# Patient Record
Sex: Male | Born: 1940 | Race: White | Hispanic: No | Marital: Married | Smoking: Never smoker
Health system: Southern US, Community
[De-identification: ages and names within clinical notes are randomized; demographics above are authoritative.]

## PROBLEM LIST (undated history)

## (undated) DIAGNOSIS — G4730 Sleep apnea, unspecified: Secondary | ICD-10-CM

## (undated) DIAGNOSIS — R0981 Nasal congestion: Secondary | ICD-10-CM

## (undated) DIAGNOSIS — Z789 Other specified health status: Secondary | ICD-10-CM

## (undated) DIAGNOSIS — J302 Other seasonal allergic rhinitis: Secondary | ICD-10-CM

## (undated) DIAGNOSIS — N289 Disorder of kidney and ureter, unspecified: Secondary | ICD-10-CM

## (undated) DIAGNOSIS — M109 Gout, unspecified: Secondary | ICD-10-CM

## (undated) DIAGNOSIS — E785 Hyperlipidemia, unspecified: Secondary | ICD-10-CM

## (undated) DIAGNOSIS — I252 Old myocardial infarction: Secondary | ICD-10-CM

## (undated) DIAGNOSIS — K589 Irritable bowel syndrome without diarrhea: Secondary | ICD-10-CM

## (undated) DIAGNOSIS — M1990 Unspecified osteoarthritis, unspecified site: Secondary | ICD-10-CM

## (undated) DIAGNOSIS — I2510 Atherosclerotic heart disease of native coronary artery without angina pectoris: Secondary | ICD-10-CM

## (undated) HISTORY — PX: COLONOSCOPY: SHX174

## (undated) HISTORY — DX: Old myocardial infarction: I25.2

## (undated) HISTORY — DX: Other specified health status: Z78.9

## (undated) HISTORY — DX: Atherosclerotic heart disease of native coronary artery without angina pectoris: I25.10

## (undated) HISTORY — PX: TOE DEBRIDEMENT: SHX1069

---

## 2002-11-27 ENCOUNTER — Emergency Department (HOSPITAL_COMMUNITY): Admission: EM | Admit: 2002-11-27 | Discharge: 2002-11-27 | Payer: Self-pay | Admitting: *Deleted

## 2002-11-27 ENCOUNTER — Encounter: Payer: Self-pay | Admitting: *Deleted

## 2003-01-13 ENCOUNTER — Encounter: Payer: Self-pay | Admitting: Family Medicine

## 2003-01-13 ENCOUNTER — Encounter: Admission: RE | Admit: 2003-01-13 | Discharge: 2003-01-13 | Payer: Self-pay | Admitting: Family Medicine

## 2003-02-22 ENCOUNTER — Ambulatory Visit (HOSPITAL_COMMUNITY): Admission: RE | Admit: 2003-02-22 | Discharge: 2003-02-22 | Payer: Self-pay | Admitting: Gastroenterology

## 2003-08-17 ENCOUNTER — Encounter: Admission: RE | Admit: 2003-08-17 | Discharge: 2003-08-17 | Payer: Self-pay | Admitting: Gastroenterology

## 2004-05-29 ENCOUNTER — Encounter: Admission: RE | Admit: 2004-05-29 | Discharge: 2004-05-29 | Payer: Self-pay | Admitting: Family Medicine

## 2008-09-16 ENCOUNTER — Encounter
Admission: RE | Admit: 2008-09-16 | Discharge: 2008-09-16 | Payer: Self-pay | Admitting: Physical Medicine and Rehabilitation

## 2008-12-13 ENCOUNTER — Encounter: Admission: RE | Admit: 2008-12-13 | Discharge: 2008-12-13 | Payer: Self-pay | Admitting: Family Medicine

## 2008-12-24 ENCOUNTER — Encounter: Admission: RE | Admit: 2008-12-24 | Discharge: 2008-12-24 | Payer: Self-pay | Admitting: Orthopedic Surgery

## 2009-10-08 HISTORY — PX: INCISION AND DRAINAGE: SHX5863

## 2010-03-24 ENCOUNTER — Emergency Department (HOSPITAL_COMMUNITY): Admission: EM | Admit: 2010-03-24 | Discharge: 2010-03-24 | Payer: Self-pay | Admitting: Emergency Medicine

## 2010-03-25 ENCOUNTER — Ambulatory Visit (HOSPITAL_COMMUNITY): Admission: RE | Admit: 2010-03-25 | Discharge: 2010-03-25 | Payer: Self-pay | Admitting: Orthopedic Surgery

## 2010-10-29 ENCOUNTER — Encounter: Payer: Self-pay | Admitting: Family Medicine

## 2010-12-24 LAB — CBC
HCT: 40.2 % (ref 39.0–52.0)
Hemoglobin: 14.1 g/dL (ref 13.0–17.0)
MCHC: 35.1 g/dL (ref 30.0–36.0)
MCV: 95 fL (ref 78.0–100.0)
Platelets: 210 10*3/uL (ref 150–400)
RBC: 4.23 MIL/uL (ref 4.22–5.81)
RDW: 12.7 % (ref 11.5–15.5)
WBC: 4.7 10*3/uL (ref 4.0–10.5)

## 2011-02-23 NOTE — Op Note (Signed)
   NAME:  Timothy Wilcox, Timothy Wilcox                      ACCOUNT NO.:  [id number]   MEDICAL RECORD NO.:  [id number]                   PATIENT TYPE:  AMB   LOCATION:  ENDO                                 FACILITY:  [facility]   PHYSICIAN:  James L. Malon Kindle., M.D.          DATE OF BIRTH:  August 20, 1941   DATE OF PROCEDURE:  02/22/2003  DATE OF DISCHARGE:                                 OPERATIVE REPORT   PROCEDURE:  Colonoscopy.   MEDICATIONS:  Fentanyl 100 mcg, Versed 10 mg IV.   INDICATIONS:  The patient has previous colonoscopy.  Has a very strong  family history of colon cancer in his father.  This is done as a five-year  followup.   DESCRIPTION OF PROCEDURE:  The procedure had been explained to the patient  and consent obtained.  The patient was placed in the left lateral decubitus  position.  The Olympus pediatric intestinal colonoscope was inserted and  advanced to the cecum using abdominal pressure and position changes.  The  cecum, ascending colon, transverse colon, descending and sigmoid colon were  seen well.  No polyps or other lesions were seen.  The rectum was free of  polyps.  On the retroflexed view, the patient was seen to have a large  internal hemorrhoid.  The scope was withdrawn.  The patient tolerated the  procedure well, maintained on low-flow oxygen, pulse oximetry throughout the  procedure.   ASSESSMENT:  1. No evidence of polyps in this high-risk individual.  2. Internal hemorrhoids, given hemorrhoid instruction sheet.  3. Will end up recommending repeat procedure in five years.                                                 James L. Malon Kindle., M.D.    Waldron Session  D:  02/22/2003  T:  02/22/2003  Job:  [id number]

## 2012-03-24 ENCOUNTER — Emergency Department (HOSPITAL_COMMUNITY)
Admission: EM | Admit: 2012-03-24 | Discharge: 2012-03-24 | Disposition: A | Discharge status: Home / Self Care | Payer: Medicare Other | Attending: Emergency Medicine | Admitting: Emergency Medicine

## 2012-03-24 ENCOUNTER — Emergency Department (HOSPITAL_COMMUNITY): Payer: Medicare Other

## 2012-03-24 ENCOUNTER — Encounter (HOSPITAL_COMMUNITY): Payer: Self-pay | Admitting: Emergency Medicine

## 2012-03-24 DIAGNOSIS — N201 Calculus of ureter: Secondary | ICD-10-CM | POA: Insufficient documentation

## 2012-03-24 DIAGNOSIS — N200 Calculus of kidney: Secondary | ICD-10-CM

## 2012-03-24 DIAGNOSIS — R109 Unspecified abdominal pain: Secondary | ICD-10-CM | POA: Insufficient documentation

## 2012-03-24 DIAGNOSIS — N23 Unspecified renal colic: Secondary | ICD-10-CM

## 2012-03-24 HISTORY — DX: Disorder of kidney and ureter, unspecified: N28.9

## 2012-03-24 LAB — POCT I-STAT, CHEM 8
BUN: 24 mg/dL — ABNORMAL HIGH (ref 6–23)
Calcium, Ion: 1.14 mmol/L (ref 1.12–1.32)
Chloride: 108 mEq/L (ref 96–112)
Creatinine, Ser: 0.9 mg/dL (ref 0.50–1.35)
Glucose, Bld: 115 mg/dL — ABNORMAL HIGH (ref 70–99)
HCT: 45 % (ref 39.0–52.0)
Hemoglobin: 15.3 g/dL (ref 13.0–17.0)
Potassium: 3.7 mEq/L (ref 3.5–5.1)
Sodium: 142 mEq/L (ref 135–145)
TCO2: 22 mmol/L (ref 0–100)

## 2012-03-24 MED ORDER — ONDANSETRON HCL 4 MG/2ML IJ SOLN
4.0000 mg | Freq: Once | INTRAMUSCULAR | Status: DC
  Filled 2012-03-24: qty 2

## 2012-03-24 MED ORDER — HYDROMORPHONE HCL PF 1 MG/ML IJ SOLN
1.0000 mg | Freq: Once | INTRAMUSCULAR | Status: AC
  Administered 2012-03-24: 1 mg via INTRAVENOUS
  Filled 2012-03-24: qty 1

## 2012-03-24 MED ORDER — ONDANSETRON 8 MG PO TBDP: 8.0000 mg | ORAL_TABLET | Freq: Three times a day (TID) | ORAL | Status: AC | PRN

## 2012-03-24 MED ORDER — HYDROMORPHONE HCL PF 2 MG/ML IJ SOLN
2.0000 mg | Freq: Once | INTRAMUSCULAR | Status: AC
  Administered 2012-03-24: 2 mg via INTRAVENOUS
  Filled 2012-03-24: qty 1

## 2012-03-24 MED ORDER — KETOROLAC TROMETHAMINE 30 MG/ML IJ SOLN
30.0000 mg | Freq: Once | INTRAMUSCULAR | Status: AC
  Administered 2012-03-24: 30 mg via INTRAVENOUS
  Filled 2012-03-24: qty 1

## 2012-03-24 MED ORDER — OXYCODONE-ACETAMINOPHEN 5-325 MG PO TABS: 1.0000 | ORAL_TABLET | Freq: Four times a day (QID) | ORAL | Status: AC | PRN

## 2012-03-24 MED ORDER — TAMSULOSIN HCL 0.4 MG PO CAPS: 0.4000 mg | ORAL_CAPSULE | Freq: Every day | ORAL | Status: DC

## 2012-03-24 MED ORDER — SODIUM CHLORIDE 0.9 % IV BOLUS (SEPSIS)
1000.0000 mL | Freq: Once | INTRAVENOUS | Status: AC
  Administered 2012-03-24: 1000 mL via INTRAVENOUS

## 2012-03-24 NOTE — ED Notes (Signed)
Pt c/o right flank pain. Reports h/o kidney stones.

## 2012-03-24 NOTE — ED Notes (Signed)
Patient aware of need for urine specimen. Patient unable to void at this time. Patient given urinal. Encouraged to call for assistance if needed.   

## 2012-03-24 NOTE — ED Provider Notes (Signed)
History     CSN: [id number]  Arrival date & time 03/24/12  1358   First MD Initiated Contact with Patient 03/24/12 1420      Chief Complaint  Patient presents with  . Flank Pain    (Consider location/radiation/quality/duration/timing/severity/associated sxs/prior treatment) HPI Comments: Patient with a history of kidney stones comes in today with right sided flank pain.  He reports that the pain has been intermittent and began today.  Pain gradually worsening and becoming more constant.  Pain radiated to the RLQ of the abdomen.  He reports that the pain today is similar to pain that he has had in the past when he has had a kidney stone.  Last kidney stone was approximately 1 year ago.  Pain associated with nausea, but no vomiting.  He has not noticed any gross hematuria.  No fever or chills.  Patient is a 71 y.o. male presenting with flank pain. The history is provided by the patient.  Flank Pain Associated symptoms include nausea. Pertinent negatives include no abdominal pain, chills, fever or vomiting.    Past Medical History  Diagnosis Date  . Renal disorder     kidney stones    History reviewed. No pertinent past surgical history.  History reviewed. No pertinent family history.  History  Substance Use Topics  . Smoking status: Not on file  . Smokeless tobacco: Not on file  . Alcohol Use:       Review of Systems  Constitutional: Negative for fever and chills.  Respiratory: Negative for shortness of breath.   Gastrointestinal: Positive for nausea. Negative for vomiting and abdominal pain.  Genitourinary: Positive for flank pain. Negative for dysuria, urgency, hematuria, decreased urine volume, scrotal swelling, difficulty urinating and testicular pain.  Neurological: Negative for dizziness, syncope and light-headedness.  Psychiatric/Behavioral: Negative for confusion.    Allergies  Statins  Home Medications  No current outpatient prescriptions on file.  BP  141/64  Pulse 69  Temp 97.8 F (36.6 C) (Oral)  Resp 26  SpO2 100%  Physical Exam  Nursing note and vitals reviewed. Constitutional: He appears well-developed and well-nourished. He appears distressed.       Uncomfortable appearing  HENT:  Head: Normocephalic and atraumatic.  Mouth/Throat: Oropharynx is clear and moist.  Cardiovascular: Normal rate, regular rhythm and normal heart sounds.   Pulmonary/Chest: Effort normal and breath sounds normal.  Abdominal: Soft. Bowel sounds are normal. He exhibits no distension and no mass. There is no tenderness. There is CVA tenderness. There is no rigidity, no rebound and no guarding.       Right CVA tenderness  Neurological: He is alert.  Skin: Skin is warm and dry. He is not diaphoretic.  Psychiatric: He has a normal mood and affect.    ED Course  Procedures (including critical care time)  Labs Reviewed - No data to display Ct Abdomen Pelvis Wo Contrast  03/24/2012  *RADIOLOGY REPORT*  Clinical Data: Right flank pain  CT ABDOMEN AND PELVIS WITHOUT CONTRAST  Technique:  Multidetector CT imaging of the abdomen and pelvis was performed following the standard protocol without intravenous contrast.  Comparison: Report 11/27/2002 no images available  Findings: Lung bases are unremarkable.  Punctate calcifications within liver and spleen are probable due to prior granulomatous disease.  Small hiatal hernia is noted.  Sagittal images of the spine shows disc space flattening with mild anterior and mild posterior spurring at L1-L2 and L2-L3 level. Disc calcifications are noted at L4-L5 level.  Mild posterior spurring  at L5 S1 level.  No calcified gallstones are noted within gallbladder.  Mild atherosclerotic calcifications of distal abdominal aorta and iliac arteries.  There is ectatic distal abdominal aorta measures 2.5 cm x 2.5 cm in diameter.  There is mild right hydronephrosis and proximal right hydroureter. Mild right perinephric stranding is noted.   Tiny nonobstructive calculus in the lower pole of the right kidney measures 2 mm.  There is a nonobstructive calcified calculus in the upper pole of the left kidney anteriorly measures 8 mm.  In axial image 60 there is 3 mm calcified calculus in the mid right ureter at at the level of the lower endplate of the L5 vertebral body.  No left ureteral calculi are noted.  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  No small bowel obstruction.  No ascites or free air.  No adenopathy.  There is no pericecal inflammation. Normal appendix is partially visualized in axial image 56.  Bilateral distal ureter is unremarkable.  Prostate gland measures 5.6 x 4 cm.  No calcified calculi are noted within urinary bladder.  IMPRESSION:  1.  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  Mild right hydronephrosis and proximal right hydroureter. 2.  There is 3 mm calcified calculus in mid right ureter at the level of the lower endplate of the L5 vertebral body.  Mild right perinephric and proximal right periureteral stranding.  3.  No small bowel obstruction. 4.  Degenerative changes lumbar spine. 5.  Normal appendix. 6.  Sigmoid colon diverticula are noted without evidence of acute diverticulitis.  Original Report Authenticated By: Natasha Mead, M.D.     No diagnosis found.  4:07 PM Reassessed patient.  He reports that his pain has significantly improved at this time.  MDM  Pt has been diagnosed with a Kidney Stone via CT. There is no evidence of significant hydronephrosis, serum creatine WNL, vitals sign stable and the pt does not have irratractable vomiting. Pt will be dc home with pain medications & has been advised to follow up with Urology.  Patient given prescription for Flomax, Percocet, and Zofran.         Pascal Lux Dennis, PA-C 03/24/12 1734

## 2012-03-24 NOTE — ED Provider Notes (Signed)
Medical screening examination/treatment/procedure(s) were conducted as a shared visit with non-physician practitioner(s) and myself.  I personally evaluated the patient during the encounter Hx of kidney stones X5.  C/o acute onset of right flank pain with hematuria.  No vomiting, or fever.  Patient is in significant distress, writhing in pain.  We'll perform a CAT scan, and laboratory testing, and provide IV analgesics.  Cheri Guppy, MD 03/24/12 1544

## 2012-03-25 NOTE — ED Provider Notes (Signed)
Medical screening examination/treatment/procedure(s) were conducted as a shared visit with non-physician practitioner(s) and myself.  I personally evaluated the patient during the encounter  Shawnette Augello, MD 03/25/12 1353 

## 2013-01-16 ENCOUNTER — Other Ambulatory Visit: Payer: Self-pay | Admitting: Orthopedic Surgery

## 2013-01-22 ENCOUNTER — Encounter (HOSPITAL_BASED_OUTPATIENT_CLINIC_OR_DEPARTMENT_OTHER): Payer: Self-pay | Admitting: *Deleted

## 2013-01-22 ENCOUNTER — Encounter (HOSPITAL_BASED_OUTPATIENT_CLINIC_OR_DEPARTMENT_OTHER)
Admission: RE | Admit: 2013-01-22 | Discharge: 2013-01-22 | Disposition: A | Discharge status: Home / Self Care | Payer: Medicare Other | Source: Ambulatory Visit | Attending: Orthopedic Surgery | Admitting: Orthopedic Surgery

## 2013-01-22 DIAGNOSIS — Z01810 Encounter for preprocedural cardiovascular examination: Secondary | ICD-10-CM | POA: Insufficient documentation

## 2013-01-22 DIAGNOSIS — Z01812 Encounter for preprocedural laboratory examination: Secondary | ICD-10-CM | POA: Insufficient documentation

## 2013-01-22 DIAGNOSIS — Z01818 Encounter for other preprocedural examination: Secondary | ICD-10-CM | POA: Insufficient documentation

## 2013-01-22 LAB — BASIC METABOLIC PANEL
BUN: 23 mg/dL (ref 6–23)
Chloride: 103 mEq/L (ref 96–112)
GFR calc Af Amer: 78 mL/min — ABNORMAL LOW (ref >90)
GFR calc non Af Amer: 67 mL/min — ABNORMAL LOW (ref >90)
Potassium: 4.9 mEq/L (ref 3.5–5.1)
Sodium: 140 mEq/L (ref 135–145)

## 2013-01-22 NOTE — Progress Notes (Signed)
On allopurinol-to come in for bmet-ekg Bring cpap and overnight bag and all meds

## 2013-01-28 NOTE — H&P (Signed)
  Timothy Wilcox is an 72 y.o. male.   Chief Complaint: c/o chronic and progressive right shoulder pain HPI: Timothy Wilcox was last seen almost four years ago for right shoulder impingement syndrome.  Since his last visit he has developed a fair amount of nocturnal type symptoms.  He is 72, he is right-hand dominant.  He is 5'8", 210 pounds. He denies any particular injury.  His pain is intermittent, moderate to severe in nature, a stabbing type pain with associated weakness.  He has tried ibuprofen. He was seen here in the past for impingement syndrome that responded to therapy and injections. He presents today with worsening of his symptoms. He is not diabetic.   Past Medical History  Diagnosis Date  . Gout   . Renal disorder     kidney stones  . IBS (irritable bowel syndrome)   . Hyperlipemia   . Arthritis   . Sleep apnea     uses a c-pap  . Sinus congestion     chronic  . Seasonal allergies     Past Surgical History  Procedure Laterality Date  . Colonoscopy    . Incision and drainage  2011    infected finger  . Toe debridement      rt toe cyst    History reviewed. No pertinent family history. Social History:  reports that he has never smoked. He does not have any smokeless tobacco history on file. He reports that  drinks alcohol. He reports that he does not use illicit drugs.  Allergies:  Allergies  Allergen Reactions  . Statins     Pain in joints    No prescriptions prior to admission    No results found for this or any previous visit (from the past 48 hour(s)).  No results found.   Pertinent items are noted in HPI.  Height 5\' 8"  (1.727 m), weight 95.255 kg (210 lb).  General appearance: alert Head: Normocephalic, without obvious abnormality Neck: supple, symmetrical, trachea midline Resp: clear to auscultation bilaterally Cardio: regular rate and rhythm GI: normal findings: bowel sounds normal Extremities: .  Examination of his upper extremity on the right,  his shoulder shows forward flexion of 170, abduction 170, external rotation 65, external rotation at 90 degrees abduction 85 degrees, internal rotation to T-10.  He has signs of impingement, cross-chest adduction maneuver is positive.  He has pain with resistance of rotator cuff musculature and mild discomfort anteriorly over the biceps.    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    We did obtain MRI that shows a full thickness mildly retracted supraspinatus tear, biceps intact.  No other significant findings on his MRI.  Pulses: 2+ and symmetric Skin: normal Neurologic: Grossly normal    Assessment/Plan Impression:Right shoulder impingement with RC tear  Plan:To the OR for right SA with SAD/DCR and RC repair as needed.The procedure, risks,benefits and post-op course were discussed with the patient at length and they were in agreement with the plan.  DASNOIT,[name] 01/28/2013, 4:47 PM   H&P documentation: 01/29/2013  -History and Physical Reviewed  -Patient has been re-examined  -No change in the plan of care  Wyn Forster, MD

## 2013-01-29 ENCOUNTER — Encounter (HOSPITAL_BASED_OUTPATIENT_CLINIC_OR_DEPARTMENT_OTHER): Payer: Self-pay | Admitting: Anesthesiology

## 2013-01-29 ENCOUNTER — Encounter (HOSPITAL_BASED_OUTPATIENT_CLINIC_OR_DEPARTMENT_OTHER): Payer: Self-pay | Admitting: *Deleted

## 2013-01-29 ENCOUNTER — Ambulatory Visit (HOSPITAL_BASED_OUTPATIENT_CLINIC_OR_DEPARTMENT_OTHER): Payer: Medicare Other | Admitting: Anesthesiology

## 2013-01-29 ENCOUNTER — Encounter (HOSPITAL_BASED_OUTPATIENT_CLINIC_OR_DEPARTMENT_OTHER)
Admission: RE | Disposition: A | Discharge status: Home / Self Care | Payer: Self-pay | Source: Ambulatory Visit | Attending: Orthopedic Surgery

## 2013-01-29 ENCOUNTER — Ambulatory Visit (HOSPITAL_BASED_OUTPATIENT_CLINIC_OR_DEPARTMENT_OTHER)
Admission: RE | Admit: 2013-01-29 | Discharge: 2013-01-30 | Disposition: A | Discharge status: Home / Self Care | Payer: Medicare Other | Source: Ambulatory Visit | Attending: Orthopedic Surgery | Admitting: Orthopedic Surgery

## 2013-01-29 DIAGNOSIS — Z888 Allergy status to other drugs, medicaments and biological substances status: Secondary | ICD-10-CM | POA: Insufficient documentation

## 2013-01-29 DIAGNOSIS — K589 Irritable bowel syndrome without diarrhea: Secondary | ICD-10-CM | POA: Insufficient documentation

## 2013-01-29 DIAGNOSIS — M25819 Other specified joint disorders, unspecified shoulder: Secondary | ICD-10-CM | POA: Insufficient documentation

## 2013-01-29 DIAGNOSIS — M109 Gout, unspecified: Secondary | ICD-10-CM | POA: Insufficient documentation

## 2013-01-29 DIAGNOSIS — J309 Allergic rhinitis, unspecified: Secondary | ICD-10-CM | POA: Insufficient documentation

## 2013-01-29 DIAGNOSIS — E785 Hyperlipidemia, unspecified: Secondary | ICD-10-CM | POA: Insufficient documentation

## 2013-01-29 DIAGNOSIS — S43429A Sprain of unspecified rotator cuff capsule, initial encounter: Secondary | ICD-10-CM | POA: Insufficient documentation

## 2013-01-29 DIAGNOSIS — G4730 Sleep apnea, unspecified: Secondary | ICD-10-CM | POA: Insufficient documentation

## 2013-01-29 DIAGNOSIS — M19019 Primary osteoarthritis, unspecified shoulder: Secondary | ICD-10-CM | POA: Insufficient documentation

## 2013-01-29 DIAGNOSIS — X58XXXA Exposure to other specified factors, initial encounter: Secondary | ICD-10-CM | POA: Insufficient documentation

## 2013-01-29 HISTORY — DX: Nasal congestion: R09.81

## 2013-01-29 HISTORY — DX: Irritable bowel syndrome, unspecified: K58.9

## 2013-01-29 HISTORY — DX: Sleep apnea, unspecified: G47.30

## 2013-01-29 HISTORY — DX: Hyperlipidemia, unspecified: E78.5

## 2013-01-29 HISTORY — DX: Unspecified osteoarthritis, unspecified site: M19.90

## 2013-01-29 HISTORY — DX: Gout, unspecified: M10.9

## 2013-01-29 HISTORY — PX: SHOULDER ARTHROSCOPY WITH ROTATOR CUFF REPAIR AND SUBACROMIAL DECOMPRESSION: SHX5686

## 2013-01-29 HISTORY — DX: Other seasonal allergic rhinitis: J30.2

## 2013-01-29 LAB — POCT HEMOGLOBIN-HEMACUE: Hemoglobin: 14.6 g/dL (ref 13.0–17.0)

## 2013-01-29 SURGERY — SHOULDER ARTHROSCOPY WITH ROTATOR CUFF REPAIR AND SUBACROMIAL DECOMPRESSION
Anesthesia: Regional | Site: Shoulder | Laterality: Right | Wound class: Clean

## 2013-01-29 MED ORDER — SODIUM CHLORIDE 0.9 % IV SOLN
INTRAVENOUS | Status: DC
  Administered 2013-01-29: 20 mL/h via INTRAVENOUS

## 2013-01-29 MED ORDER — GLYCOPYRROLATE 0.2 MG/ML IJ SOLN
INTRAMUSCULAR | Status: DC | PRN
  Administered 2013-01-29: 0.2 mg via INTRAVENOUS

## 2013-01-29 MED ORDER — METOCLOPRAMIDE HCL 5 MG/ML IJ SOLN: 10.0000 mg | Freq: Once | INTRAMUSCULAR | Status: AC | PRN

## 2013-01-29 MED ORDER — HYDROMORPHONE HCL PF 1 MG/ML IJ SOLN: 0.5000 mg | INTRAMUSCULAR | Status: DC | PRN

## 2013-01-29 MED ORDER — CEPHALEXIN 500 MG PO CAPS: 500.0000 mg | ORAL_CAPSULE | Freq: Three times a day (TID) | ORAL | Status: DC

## 2013-01-29 MED ORDER — DEXAMETHASONE SODIUM PHOSPHATE 4 MG/ML IJ SOLN
INTRAMUSCULAR | Status: DC | PRN
  Administered 2013-01-29: 10 mg via INTRAVENOUS

## 2013-01-29 MED ORDER — FENTANYL CITRATE 0.05 MG/ML IJ SOLN
50.0000 ug | INTRAMUSCULAR | Status: DC | PRN
  Administered 2013-01-29: 50 ug via INTRAVENOUS

## 2013-01-29 MED ORDER — HYDROMORPHONE HCL 2 MG PO TABS: ORAL_TABLET | ORAL | Status: DC

## 2013-01-29 MED ORDER — CHLORHEXIDINE GLUCONATE 4 % EX LIQD: 60.0000 mL | Freq: Once | CUTANEOUS | Status: DC

## 2013-01-29 MED ORDER — MIDAZOLAM HCL 2 MG/2ML IJ SOLN
1.0000 mg | INTRAMUSCULAR | Status: DC | PRN
  Administered 2013-01-29: 2 mg via INTRAVENOUS

## 2013-01-29 MED ORDER — ONDANSETRON HCL 4 MG PO TABS: 4.0000 mg | ORAL_TABLET | Freq: Four times a day (QID) | ORAL | Status: DC | PRN

## 2013-01-29 MED ORDER — SUCCINYLCHOLINE CHLORIDE 20 MG/ML IJ SOLN
INTRAMUSCULAR | Status: DC | PRN
  Administered 2013-01-29: 100 mg via INTRAVENOUS

## 2013-01-29 MED ORDER — ONDANSETRON HCL 4 MG/2ML IJ SOLN: 4.0000 mg | Freq: Four times a day (QID) | INTRAMUSCULAR | Status: DC | PRN

## 2013-01-29 MED ORDER — ROPIVACAINE HCL 5 MG/ML IJ SOLN
INTRAMUSCULAR | Status: DC | PRN
  Administered 2013-01-29: 15 mL

## 2013-01-29 MED ORDER — OXYCODONE HCL 5 MG PO TABS: 5.0000 mg | ORAL_TABLET | Freq: Once | ORAL | Status: AC | PRN

## 2013-01-29 MED ORDER — LIDOCAINE HCL (CARDIAC) 10 MG/ML IV SOLN
INTRAVENOUS | Status: DC | PRN
  Administered 2013-01-29: 40 mg via INTRAVENOUS

## 2013-01-29 MED ORDER — METHOCARBAMOL 500 MG PO TABS
500.0000 mg | ORAL_TABLET | Freq: Four times a day (QID) | ORAL | Status: DC | PRN
  Administered 2013-01-29: 500 mg via ORAL

## 2013-01-29 MED ORDER — LIDOCAINE HCL 1 % IJ SOLN
INTRAMUSCULAR | Status: DC | PRN
  Administered 2013-01-29: 2 mL via INTRADERMAL

## 2013-01-29 MED ORDER — LACTATED RINGERS IV SOLN
INTRAVENOUS | Status: DC
  Administered 2013-01-29 (×2): via INTRAVENOUS

## 2013-01-29 MED ORDER — OXYCODONE-ACETAMINOPHEN 5-325 MG PO TABS: 1.0000 | ORAL_TABLET | ORAL | Status: DC | PRN

## 2013-01-29 MED ORDER — ONDANSETRON HCL 4 MG/2ML IJ SOLN
INTRAMUSCULAR | Status: DC | PRN
  Administered 2013-01-29: 4 mg via INTRAVENOUS

## 2013-01-29 MED ORDER — METHOCARBAMOL 100 MG/ML IJ SOLN: 500.0000 mg | Freq: Four times a day (QID) | INTRAVENOUS | Status: DC | PRN

## 2013-01-29 MED ORDER — OXYCODONE HCL 5 MG/5ML PO SOLN: 5.0000 mg | Freq: Once | ORAL | Status: AC | PRN

## 2013-01-29 MED ORDER — HYDROMORPHONE HCL PF 1 MG/ML IJ SOLN: 0.2500 mg | INTRAMUSCULAR | Status: DC | PRN

## 2013-01-29 MED ORDER — SODIUM CHLORIDE 0.9 % IR SOLN
Status: DC | PRN
  Administered 2013-01-29: 27000 mL

## 2013-01-29 MED ORDER — CEFAZOLIN SODIUM-DEXTROSE 2-3 GM-% IV SOLR
2.0000 g | INTRAVENOUS | Status: AC
  Administered 2013-01-29: 2 g via INTRAVENOUS

## 2013-01-29 MED ORDER — FENTANYL CITRATE 0.05 MG/ML IJ SOLN
INTRAMUSCULAR | Status: DC | PRN
  Administered 2013-01-29 (×4): 25 ug via INTRAVENOUS

## 2013-01-29 MED ORDER — CEFAZOLIN SODIUM-DEXTROSE 2-3 GM-% IV SOLR
2.0000 g | Freq: Four times a day (QID) | INTRAVENOUS | Status: DC
  Administered 2013-01-29 – 2013-01-30 (×2): 2 g via INTRAVENOUS

## 2013-01-29 MED ORDER — PROPOFOL 10 MG/ML IV BOLUS
INTRAVENOUS | Status: DC | PRN
  Administered 2013-01-29: 200 mg via INTRAVENOUS

## 2013-01-29 SURGICAL SUPPLY — 81 items
ANCH SUT SWLK 19.1 CLS EYLT VT (Anchor) IMPLANT
ANCH SUT SWLK 19.1X4.75 (Anchor) ×3 IMPLANT
ANCHOR BIO SWLOCK 4.75 W/TIG (Anchor) IMPLANT
ANCHOR SUT BIO SW 4.75X19.1 (Anchor) ×3 IMPLANT
BANDAGE ADHESIVE 1X3 (GAUZE/BANDAGES/DRESSINGS) IMPLANT
BLADE AVERAGE 25X9 (BLADE) IMPLANT
BLADE CUTTER MENIS 5.5 (BLADE) IMPLANT
BLADE SURG 15 STRL LF DISP TIS (BLADE) ×2 IMPLANT
BLADE SURG 15 STRL SS (BLADE)
BUR EGG 3PK/BX (BURR) IMPLANT
BUR OVAL 6.0 (BURR) ×2 IMPLANT
CANISTER OMNI JUG 16 LITER (MISCELLANEOUS) ×2 IMPLANT
CANISTER SUCTION 2500CC (MISCELLANEOUS) ×1 IMPLANT
CANNULA TWIST IN 8.25X7CM (CANNULA) ×2 IMPLANT
CLEANER CAUTERY TIP 5X5 PAD (MISCELLANEOUS) IMPLANT
CLOTH BEACON ORANGE TIMEOUT ST (SAFETY) ×2 IMPLANT
CUTTER MENISCUS  4.2MM (BLADE) ×1
CUTTER MENISCUS 4.2MM (BLADE) ×1 IMPLANT
DECANTER SPIKE VIAL GLASS SM (MISCELLANEOUS) IMPLANT
DRAPE INCISE IOBAN 66X45 STRL (DRAPES) ×2 IMPLANT
DRAPE STERI 35X30 U-POUCH (DRAPES) ×2 IMPLANT
DRAPE SURG 17X23 STRL (DRAPES) ×2 IMPLANT
DRAPE U-SHAPE 47X51 STRL (DRAPES) ×2 IMPLANT
DRAPE U-SHAPE 76X120 STRL (DRAPES) ×4 IMPLANT
DRSG PAD ABDOMINAL 8X10 ST (GAUZE/BANDAGES/DRESSINGS) ×2 IMPLANT
DURAPREP 26ML APPLICATOR (WOUND CARE) ×1 IMPLANT
ELECT REM PT RETURN 9FT ADLT (ELECTROSURGICAL) ×2
ELECTRODE REM PT RTRN 9FT ADLT (ELECTROSURGICAL) IMPLANT
GLOVE BIOGEL M STRL SZ7.5 (GLOVE) ×2 IMPLANT
GLOVE BIOGEL PI IND STRL 7.0 (GLOVE) IMPLANT
GLOVE BIOGEL PI IND STRL 8 (GLOVE) ×2 IMPLANT
GLOVE BIOGEL PI INDICATOR 7.0 (GLOVE) ×1
GLOVE BIOGEL PI INDICATOR 8 (GLOVE) ×2
GLOVE ECLIPSE 6.5 STRL STRAW (GLOVE) ×1 IMPLANT
GLOVE ORTHO TXT STRL SZ7.5 (GLOVE) ×2 IMPLANT
GOWN BRE IMP PREV XXLGXLNG (GOWN DISPOSABLE) ×4 IMPLANT
GOWN STRL REIN 2XL XLG LVL4 (GOWN DISPOSABLE) ×1 IMPLANT
NDL SCORPION (NEEDLE) ×1 IMPLANT
NDL SUT 6 .5 CRC .975X.05 MAYO (NEEDLE) IMPLANT
NEEDLE MAYO TAPER (NEEDLE)
NEEDLE MINI RC 24MM (NEEDLE) IMPLANT
NEEDLE SCORPION (NEEDLE) ×2 IMPLANT
PACK ARTHROSCOPY DSU (CUSTOM PROCEDURE TRAY) ×2 IMPLANT
PACK BASIN DAY SURGERY FS (CUSTOM PROCEDURE TRAY) ×2 IMPLANT
PAD CLEANER CAUTERY TIP 5X5 (MISCELLANEOUS)
PASSER SUT SWANSON 36MM LOOP (INSTRUMENTS) IMPLANT
PENCIL BUTTON HOLSTER BLD 10FT (ELECTRODE) IMPLANT
SLEEVE SCD COMPRESS KNEE MED (MISCELLANEOUS) ×2 IMPLANT
SLING ARM FOAM STRAP LRG (SOFTGOODS) ×1 IMPLANT
SLING ARM FOAM STRAP MED (SOFTGOODS) IMPLANT
SPONGE GAUZE 4X4 12PLY (GAUZE/BANDAGES/DRESSINGS) ×2 IMPLANT
SPONGE LAP 4X18 X RAY DECT (DISPOSABLE) ×1 IMPLANT
STRIP CLOSURE SKIN 1/2X4 (GAUZE/BANDAGES/DRESSINGS) IMPLANT
SUCTION FRAZIER TIP 10 FR DISP (SUCTIONS) IMPLANT
SUT ETHIBOND 2 OS 4 DA (SUTURE) IMPLANT
SUT ETHILON 4 0 PS 2 18 (SUTURE) IMPLANT
SUT FIBERWIRE #2 38 T-5 BLUE (SUTURE)
SUT FIBERWIRE 3-0 18 TAPR NDL (SUTURE)
SUT PROLENE 1 CT (SUTURE) IMPLANT
SUT PROLENE 3 0 PS 2 (SUTURE) ×2 IMPLANT
SUT TIGER TAPE 7 IN WHITE (SUTURE) ×1 IMPLANT
SUT VIC AB 0 CT1 27 (SUTURE)
SUT VIC AB 0 CT1 27XBRD ANBCTR (SUTURE) IMPLANT
SUT VIC AB 0 SH 27 (SUTURE) IMPLANT
SUT VIC AB 2-0 SH 27 (SUTURE)
SUT VIC AB 2-0 SH 27XBRD (SUTURE) IMPLANT
SUT VIC AB 3-0 SH 27 (SUTURE)
SUT VIC AB 3-0 SH 27X BRD (SUTURE) IMPLANT
SUT VIC AB 3-0 X1 27 (SUTURE) IMPLANT
SUTURE FIBERWR #2 38 T-5 BLUE (SUTURE) IMPLANT
SUTURE FIBERWR 3-0 18 TAPR NDL (SUTURE) IMPLANT
SYR 3ML 23GX1 SAFETY (SYRINGE) IMPLANT
SYR BULB 3OZ (MISCELLANEOUS) IMPLANT
TAPE FIBER 2MM 7IN #2 BLUE (SUTURE) ×2 IMPLANT
TAPE PAPER 3X10 WHT MICROPORE (GAUZE/BANDAGES/DRESSINGS) ×2 IMPLANT
TOWEL OR 17X24 6PK STRL BLUE (TOWEL DISPOSABLE) ×2 IMPLANT
TUBE CONNECTING 20X1/4 (TUBING) ×3 IMPLANT
TUBING ARTHROSCOPY IRRIG 16FT (MISCELLANEOUS) ×2 IMPLANT
WAND STAR VAC 90 (SURGICAL WAND) ×2 IMPLANT
WATER STERILE IRR 1000ML POUR (IV SOLUTION) ×2 IMPLANT
YANKAUER SUCT BULB TIP NO VENT (SUCTIONS) IMPLANT

## 2013-01-29 NOTE — Brief Op Note (Signed)
01/29/2013  4:46 PM  PATIENT:  Timothy Wilcox  72 y.o. 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  PROCEDURE:  Procedure(s): RIGHT SHOULDER ARTHROSCOPY WITH SUBACROMIAL DECOMPRESSION, DISTAL CLAVICLE RESECTION, ROTATOR CUFF REPAIR (Right)  SURGEON:  Surgeon(s) and Role:    * Wyn Forster., MD - Primary  PHYSICIAN ASSISTANT:   ASSISTANTS: Mallory Shirk.A-C    ANESTHESIA:   general  EBL:  Total I/O In: 1000 [I.V.:1000] Out: -   BLOOD ADMINISTERED:none  DRAINS: none   LOCAL MEDICATIONS USED: ropivacaine plexus block  SPECIMEN:  No Specimen  DISPOSITION OF SPECIMEN:  N/A  COUNTS:  YES  TOURNIQUET:  * No tourniquets in log *  DICTATION: .Other Dictation: Dictation Number [id number]  PLAN OF CARE: Admit to PACU and 23 hour observation due to history of sleep apnea  PATIENT DISPOSITION:  PACU - hemodynamically stable.   Delay start of Pharmacological VTE agent (>24hrs) due to surgical blood loss or risk of bleeding: not applicable

## 2013-01-29 NOTE — Progress Notes (Signed)
Assisted Dr. Frederick with right, ultrasound guided, interscalene  block. Side rails up, monitors on throughout procedure. See vital signs in flow sheet. Tolerated Procedure well. 

## 2013-01-29 NOTE — Anesthesia Postprocedure Evaluation (Signed)
Anesthesia Post Note  Patient: Timothy Wilcox  Procedure(s) Performed: Procedure(s) (LRB): RIGHT SHOULDER ARTHROSCOPY WITH SUBACROMIAL DECOMPRESSION, THREE TENDON ROTATOR CUFF REPAIR (Right)  Anesthesia type: General  Patient location: PACU  Post pain: Pain level controlled  Post assessment: Patient's Cardiovascular Status Stable  Last Vitals:  Filed Vitals:   01/29/13 1730  BP: 136/74  Pulse: 76  Temp:   Resp: 24    Post vital signs: Reviewed and stable  Level of consciousness: alert  Complications: No apparent anesthesia complications

## 2013-01-29 NOTE — Anesthesia Preprocedure Evaluation (Addendum)
Anesthesia Evaluation  Patient identified by MRN, date of birth, ID band Patient awake    Reviewed: Allergy & Precautions, H&P , NPO status , Patient's Chart, lab work & pertinent test results, reviewed documented beta blocker date and time   Airway Mallampati: II TM Distance: >3 FB Neck ROM: full    Dental   Pulmonary sleep apnea and Continuous Positive Airway Pressure Ventilation ,  breath sounds clear to auscultation        Cardiovascular negative cardio ROS  Rhythm:regular     Neuro/Psych negative neurological ROS  negative psych ROS   GI/Hepatic negative GI ROS, Neg liver ROS,   Endo/Other  negative endocrine ROS  Renal/GU negative Renal ROS  negative genitourinary   Musculoskeletal   Abdominal   Peds  Hematology negative hematology ROS (+)   Anesthesia Other Findings See surgeon's H&P   Reproductive/Obstetrics negative OB ROS                          Anesthesia Physical Anesthesia Plan  ASA: III  Anesthesia Plan: General   Post-op Pain Management:    Induction: Intravenous  Airway Management Planned: Oral ETT  Additional Equipment:   Intra-op Plan:   Post-operative Plan: Extubation in OR  Informed Consent: I have reviewed the patients History and Physical, chart, labs and discussed the procedure including the risks, benefits and alternatives for the proposed anesthesia with the patient or authorized representative who has indicated his/her understanding and acceptance.   Dental Advisory Given  Plan Discussed with: CRNA and Surgeon  Anesthesia Plan Comments:        Anesthesia Quick Evaluation

## 2013-01-29 NOTE — Op Note (Signed)
[id number] 

## 2013-01-29 NOTE — Transfer of Care (Signed)
Immediate Anesthesia Transfer of Care Note  Patient: Timothy Wilcox  Procedure(s) Performed: Procedure(s): RIGHT SHOULDER ARTHROSCOPY WITH SUBACROMIAL DECOMPRESSION, THREE TENDON ROTATOR CUFF REPAIR (Right)  Patient Location: PACU  Anesthesia Type:GA combined with regional for post-op pain  Level of Consciousness: sedated  Airway & Oxygen Therapy: Patient Spontanous Breathing and Patient connected to face mask oxygen  Post-op Assessment: Report given to PACU RN and Post -op Vital signs reviewed and stable  Post vital signs: Reviewed and stable  Complications: No apparent anesthesia complications

## 2013-01-29 NOTE — Anesthesia Procedure Notes (Addendum)
Anesthesia Regional Block:  Interscalene brachial plexus block  Pre-Anesthetic Checklist: ,, timeout performed, Correct Patient, Correct Site, Correct Laterality, Correct Procedure, Correct Position, site marked, Risks and benefits discussed,  Surgical consent,  Pre-op evaluation,  At surgeon's request and post-op pain management  Laterality: Right  Prep: chloraprep       Needles:   Needle Type: Other     Needle Length: 9cm  Needle Gauge: 21    Additional Needles:  Procedures: ultrasound guided (picture in chart) Interscalene brachial plexus block Narrative:  Start time: 01/29/2013 12:57 PM End time: 01/29/2013 1:03 PM Injection made incrementally with aspirations every 5 mL.  Performed by: Personally  Anesthesiologist: Aldona Lento, MD  Additional Notes: Ultrasound guidance used to: id relevant anatomy, confirm needle position, local anesthetic spread, avoidance of vascular puncture. Picture saved. No complications. Block performed personally by Janetta Hora. Gelene Mink, MD    Interscalene brachial plexus block Procedure Name: Intubation Date/Time: 01/29/2013 3:07 PM Performed by: Gar Gibbon Pre-anesthesia Checklist: Patient identified, Emergency Drugs available, Suction available and Patient being monitored Oxygen Delivery Method: Circle system utilized Preoxygenation: Pre-oxygenation with 100% oxygen Intubation Type: IV induction Ventilation: Mask ventilation with difficulty Laryngoscope Size: Miller and 2 Grade View: Grade III Tube type: Oral Number of attempts: 2 Airway Equipment and Method: Video-laryngoscopy Placement Confirmation: breath sounds checked- equal and bilateral and positive ETCO2 Secured at: 23 cm Tube secured with: Tape Dental Injury: Teeth and Oropharynx as per pre-operative assessment  Difficulty Due To: Difficulty was anticipated and Difficult Airway- due to anterior larynx Future Recommendations: Recommend- induction with short-acting agent,  and alternative techniques readily available

## 2013-01-30 NOTE — Op Note (Signed)
NAMETECUMSEH, YEAGLEY NO.:  [id number]  MEDICAL RECORD NO.:  [id number]  LOCATION:                                 FACILITY:  PHYSICIAN:  Katy Fitch. Xcaret Morad, M.D. DATE OF BIRTH:  June 21, 1941  DATE OF PROCEDURE:  01/29/2013 DATE OF DISCHARGE:                              OPERATIVE REPORT   POSTOPERATIVE DIAGNOSIS:  Significant stage III impingement, right shoulder with MRI evidence of retracted rotator cuff tear involving supraspinatus, infraspinatus, and tendinopathy of subscapularis.  POSTOPERATIVE DIAGNOSIS:  Grade 2 retracted tear of subscapularis and 95% retracted bursal-sided tear of supraspinatus and infraspinatus rotator cuff tendons with 30% tear of biceps tendon at entry to intertubercular groove.  OPERATIONS: 1. Diagnostic arthroscopy, right glenohumeral joint. 2. Arthroscopic debridement of labrum, biceps, subscapularis, and     rotator cuff. 3. Arthroscopic reconstruction of grade 2 subscapularis rotator cuff     tear. 4. Arthroscopic reconstruction of 95% bursal-sided rotator cuff tear     with debridement of greater tuberosity, lowering the profile of the     greater tuberosity, removing reactive osteophytes followed by     placement of 2 reverse mattress fiber tapes, 2 lateral swivel     locks, creating an anatomic footprint.  OPERATING SURGEON:  Katy Fitch. Kippy Melena, MD  ASSISTANT:  Marveen Reeks Dasnoit, PA  ANESTHESIA:  General by endotracheal technique supplemented by a ropivacaine plexus block placed by Janetta Hora. Frederick, MD, with ultrasound control in the holding area.  INDICATIONS:  Ja Ohman is a 72 year old gentleman, referred through the courtesy of Dr. Catha Gosselin for evaluation of pain in the right shoulder with weakness and impairment of sleep.  He was initially evaluated and found to have significant impingement.  Subsequently, during my  sabbatical, was seen by my partner, Dr. Mina Marble.  Dr. Mina Marble referred him for an  MRI which revealed a retracting rotator cuff tear.  Upon my return, Mr. Savarese presented for evaluation of his shoulder.  He was noted to have classic impingement signs, a very prominent AC joint, weakness of scaption, abduction, external rotation, and weakness of internal rotation with a positive push-off test.  His MRI was studied and revealed extensive tendinopathy of the rotator cuff, AC arthropathy, but not atypical prominent inferior distal clavicle.  He had a very large anterolateral acromial osteophyte that likely was the source of his impingement.  We recommended that he proceed with arthroscopic evaluation of shoulder, anticipating arthroscopic debridement of his labral pathology.  Biceps pathology noted on MRI, subscapularis supraspinatus, and infraspinatus with repair of the subscapularis as needed and repair of the rotator cuff as our findings dictated.  I advised him we might perform his rotator cuff repair of the supraspinatus and infraspinatus with open technique or arthroscopic technique depending on her ability to obtain an anatomic footprint.  After informed consent, he was brought to the operating room at this time.  Preoperatively, he was interviewed by Dr. Gelene Mink of Anesthesia, who recommended general anesthesia by endotracheal technique and placed a successful ropivacaine plexus block.  Mr. Olivencia was then transferred to room 6 of the [facility], placed in supine position on the operating table.  Under Dr. Thornton Dales direct supervision, general  endotracheal anesthesia was induced followed by careful position in the beach-chair position with the aid of a torso and head holder designed for shoulder arthroscopy.  Passive compression devices were applied to the caps and all bony prominences were carefully padded.  The right upper extremity and forequarter were prepped with DuraPrep and draped with impervious arthroscopy drapes.  Following  routine surgical time-out and proper surgical site identification protocol, we proceeded with placement of the arthroscope through a standard posterior viewing portal using an anterior switching stick technique.  Diagnostic arthroscopy revealed intact hyaline articular cartilage surfaces on the glenoid and humeral head and satisfactory appearing labrum at its inferior and posterior aspect.  The superior labrum and anterior labrum was degenerative and was debrided to smooth margin.  The biceps had a 25% to 30% tear that was hanging within the joint and there was a positive comma sign.  There is a grade 2 subscapularis tear with some medial subluxation of the biceps.  We created an anterior portal under direct vision and an anterior superior lateral portal, placing clear cannulas.  We debrided the footprint of the subscapularis, debrided the necrotic subscapularis. Subsequently, placed reverse mattress suture with a scorpion through the anterior and anterosuperior lateral portals and through the anterior portal with the shoulder in a few degrees of internal rotation, placed a 4.75-mm swivel lock, anatomically restoring the footprint of the subscapularis to the lesser tuberosity and also creating a medial wall for the biceps.  I ultimately debrided biceps to stable tissue and found that the tear was about 30%.  I chose not to perform a biceps tenodesis or tenotomy.  We then debrided the deep surface of the rotator cuff and found to be basically intact including the entire infraspinatus and supraspinatus.  The scope was then removed and placed in a subacromial space.  We immediately identified a large retracted bursal side tear involving the posterior aspect of the supraspinatus and the infraspinatus.  The bursa was debrided and the [facility] joint inspected.  The distal clavicle was not problematic, but the anterior acromion and coracoacromial ligament were definitely impinging on the tear.   The coracoacromial ligament was released with cutting cautery and hemostasis was achieved in the acromial branch with the bipolar cautery.  The acromion was leveled to a type 1 morphology with hemostasis and clearing the bursal tissues with the cutting cautery, relaxed the deltoid fascia and between the anterosuperior lateral portal and a posterolateral portal, debrided the cuff, decorticated the greater tuberosity, lowered its profile and removed the osteophytes present.  Subsequently, we were able to place 2 reverse mattress sutures for crisscross type repair of the supraspinatus, infraspinatus, one in the posterior aspect of the supraspinatus, one in the anterior aspect of the infraspinatus after thorough tendon debridement and then by using the various portals, placed 2 lateral swivel locks, restoring anatomic footprint.  There was a small dog-ear posteriorly that measured less than 3 mm.  The cuff was adequately decompressed and after hemostasis and debridement, the arthroscopic clip was removed.  The portals were repaired with subcutaneous 3-0 Vicryl and intradermal 3-0 Prolene.  Her final diagnosis was 95% retracted rotator cuff tear in the supraspinatus and infraspinatus and grade 2 subscapularis tear with 30% biceps tear and a degenerative labrum.  All pathology was addressed.  Mr. Kalmbach was awakened from general anesthesia and transferred to the recovery room with stable signs.  Due to a history of sleep apnea, he will be admitted to recovery care and his vital signs  will be monitored closely overnight.  We will provide supplemental oxygen as needed, and we will monitor his O2 sats carefully and use his CPAP machine as needed.     Katy Fitch Aleeya Veitch, M.D.     RVS/MEDQ  D:  01/29/2013  T:  01/30/2013  Job:  [id number]

## 2013-02-02 ENCOUNTER — Encounter (HOSPITAL_BASED_OUTPATIENT_CLINIC_OR_DEPARTMENT_OTHER): Payer: Self-pay | Admitting: Orthopedic Surgery

## 2013-10-28 IMAGING — CT CT ABD-PELV W/O CM
1 series · 15 of 29 positions shown, 19 images · non-contrast
Comparison: Report 11/27/2002 no images available

CLINICAL DATA: Right flank pain

CT ABDOMEN AND PELVIS WITHOUT CONTRAST
TECHNIQUE: Multidetector CT imaging of the abdomen and pelvis was
performed following the standard protocol without intravenous
contrast.

[Series 4: lung · axial · 0.75mm/px · z∈[-166,-41]mm · 15 of 29 slices shown, 19 images]
[im 3/29  soft-tissue]
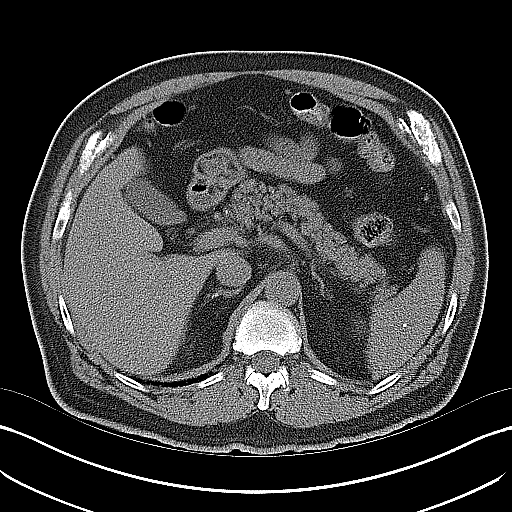
[im 3/29  bone]
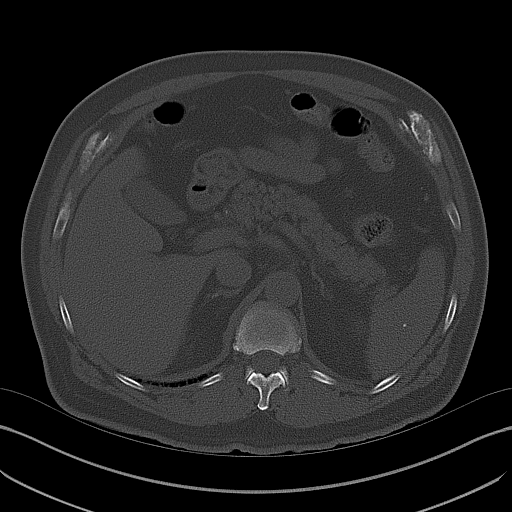
[im 5/29  soft-tissue]
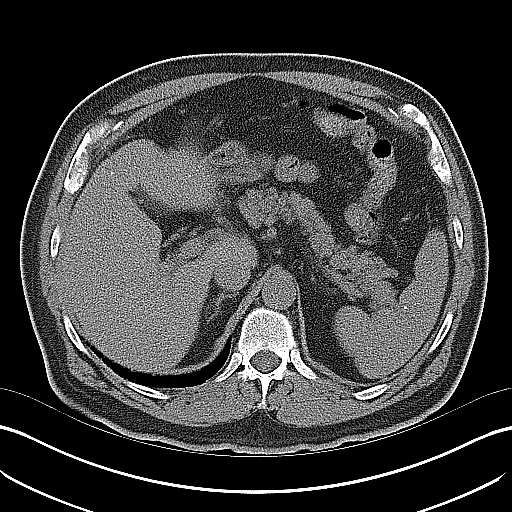
[im 7/29  soft-tissue]
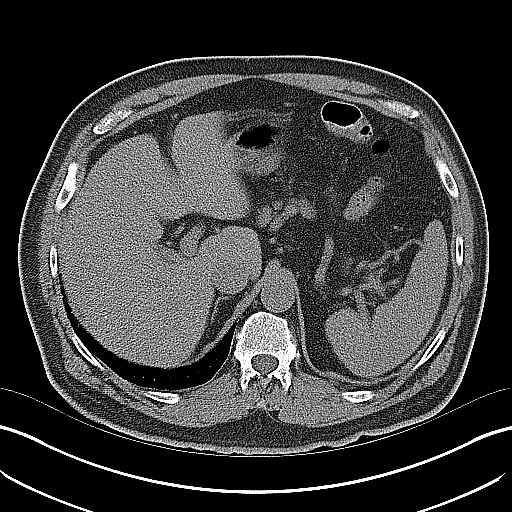
[im 9/29  soft-tissue]
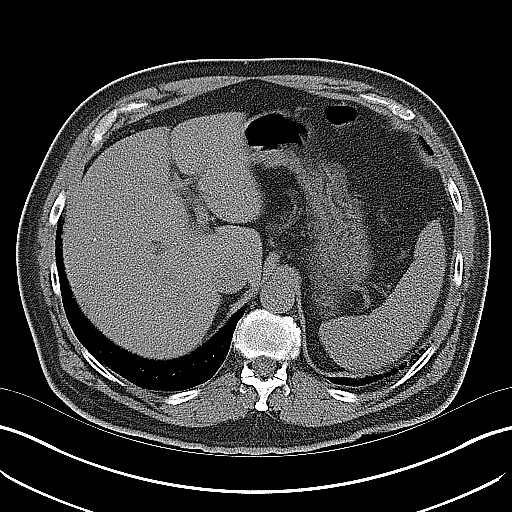
[im 11/29  soft-tissue]
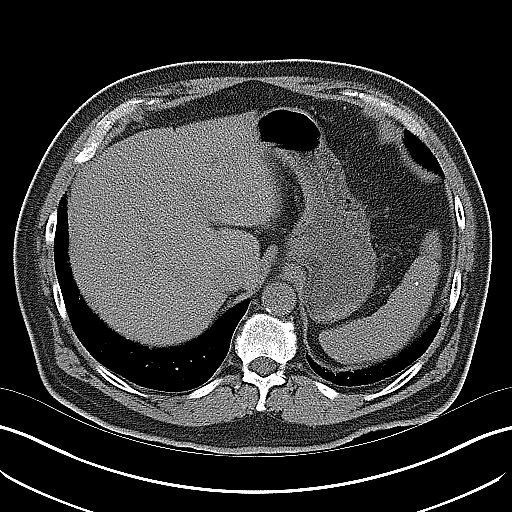
[im 13/29  soft-tissue]
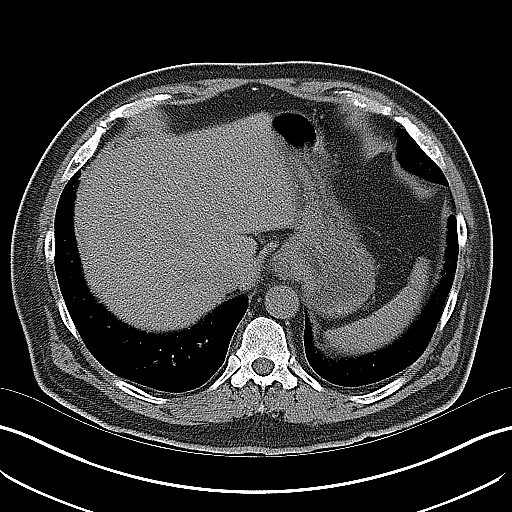
[im 15/29  soft-tissue]
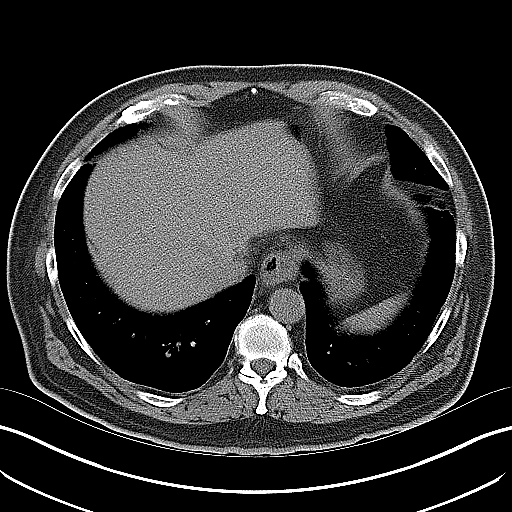
[im 17/29  soft-tissue]
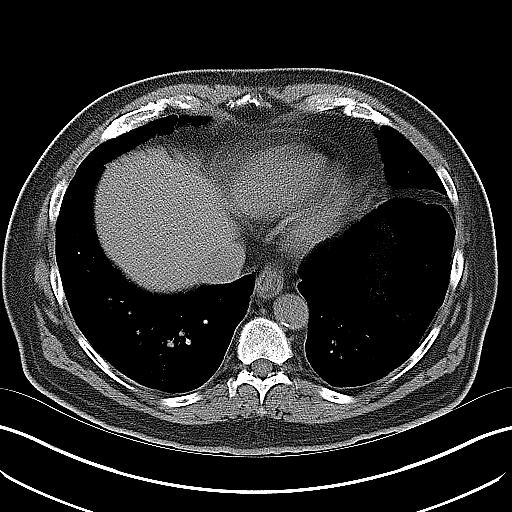
[im 19/29  soft-tissue]
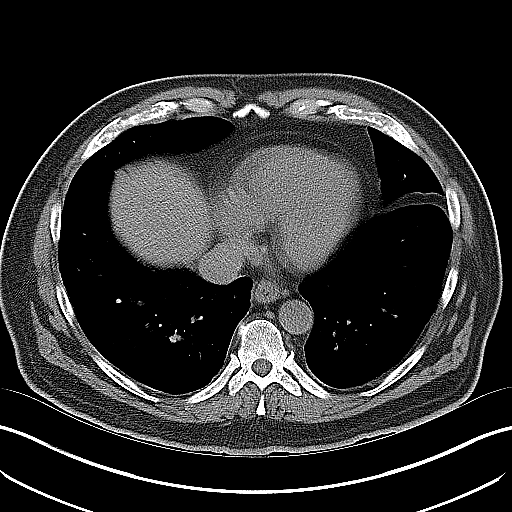
[im 19/29  bone]
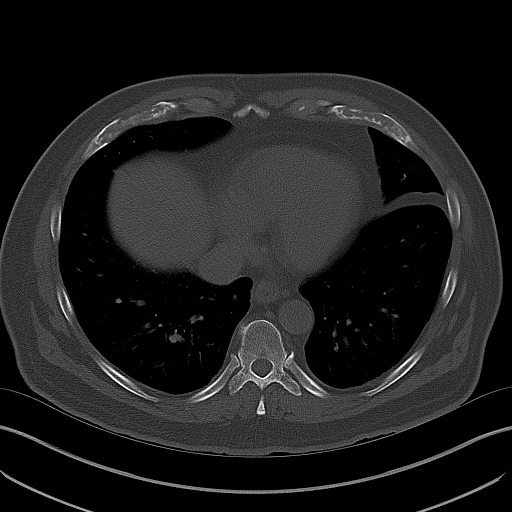
[im 21/29  soft-tissue]
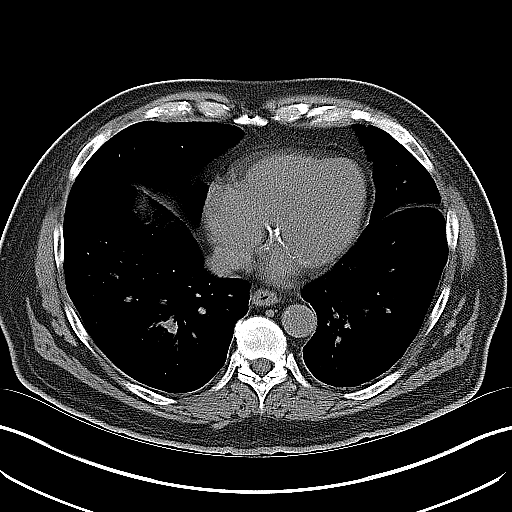
[im 23/29  soft-tissue]
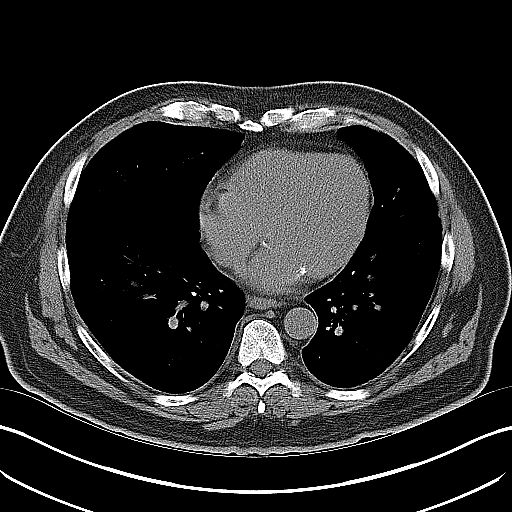
[im 25/29  soft-tissue]
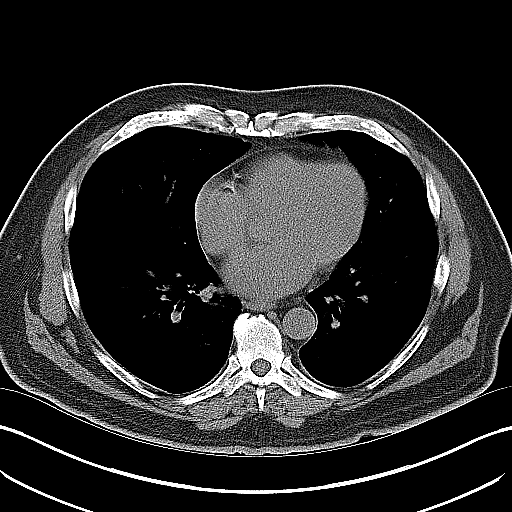
[im 25/29  lung]
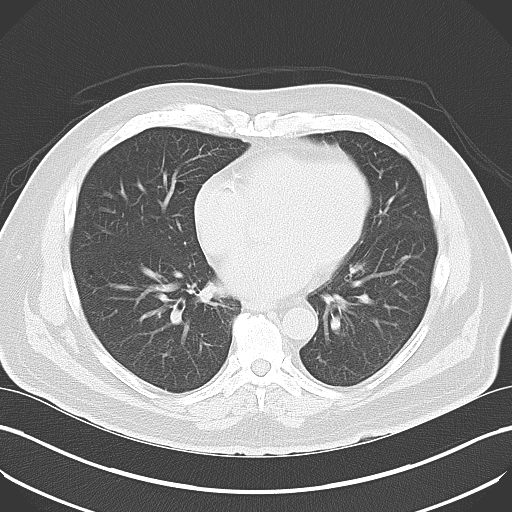
[im 26/29  lung]
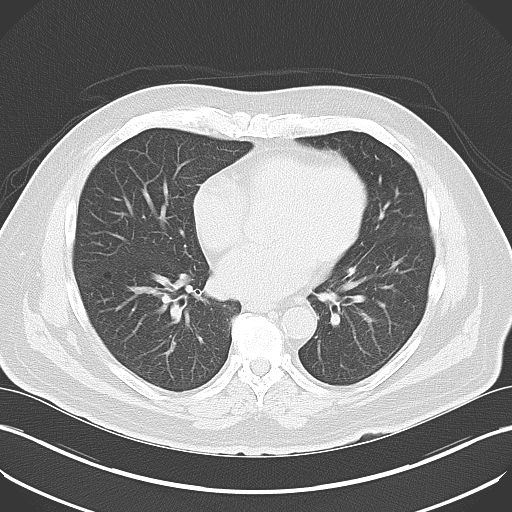
[im 27/29  soft-tissue]
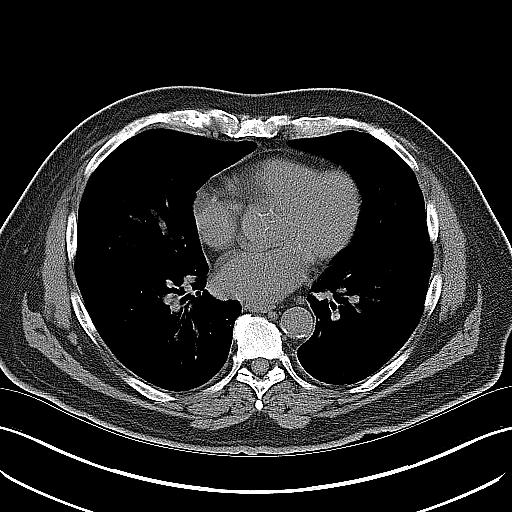
[im 27/29  lung]
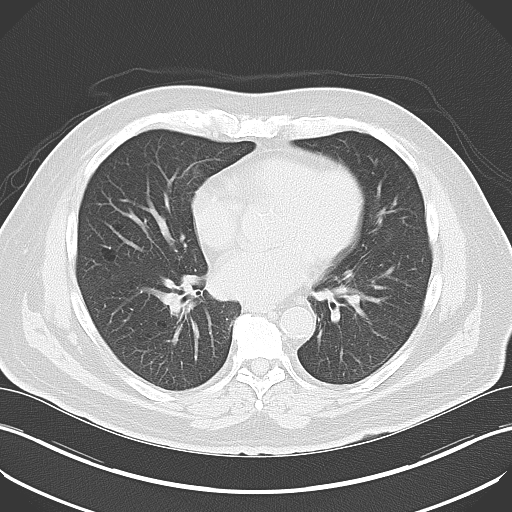
[im 28/29  lung]
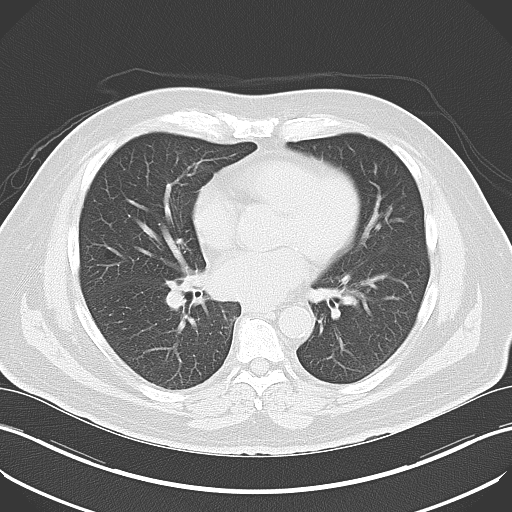

[15 of 29 positions shown; findings below may reference images not displayed]

FINDINGS: Lung bases are unremarkable.  Punctate calcifications
within liver and spleen are probable due to prior granulomatous
disease.  Small hiatal hernia is noted.

Sagittal images of the spine shows disc space flattening with mild
anterior and mild posterior spurring at L1-L2 and L2-L3 level. Disc
calcifications are noted at L4-L5 level.  Mild posterior spurring
at L5 S1 level.

No calcified gallstones are noted within gallbladder.  Mild
atherosclerotic calcifications of distal abdominal aorta and iliac
arteries.  There is ectatic distal abdominal aorta measures 2.5 cm
x 2.5 cm in diameter.

There is mild right hydronephrosis and proximal right hydroureter.
Mild right perinephric stranding is noted.

Tiny nonobstructive calculus in the lower pole of the right kidney
measures 2 mm.  There is a nonobstructive calcified calculus in the
upper pole of the left kidney anteriorly measures 8 mm.

In axial image 60 there is 3 mm calcified calculus in the mid right
ureter at at the level of the lower endplate of the L5 vertebral
body.

No left ureteral calculi are noted.

Multiple sigmoid colon diverticula are noted without evidence of
acute diverticulitis.  No small bowel obstruction.  No ascites or
free air.  No adenopathy.  There is no pericecal inflammation.
Normal appendix is partially visualized in axial image 56.

Bilateral distal ureter is unremarkable.  Prostate gland measures
5.6 x 4 cm.  No calcified calculi are noted within urinary bladder.
IMPRESSION: 1.  There is bilateral nonobstructive nephrolithiasis.  Mild right
hydronephrosis and proximal right hydroureter.
2.  There is 3 mm calcified calculus in mid right ureter at the
level of the lower endplate of the L5 vertebral body.  Mild right
perinephric and proximal right periureteral stranding.

3.  No small bowel obstruction.
4.  Degenerative changes lumbar spine.
5.  Normal appendix.
6.  Sigmoid colon diverticula are noted without evidence of acute
diverticulitis.

## 2016-06-04 ENCOUNTER — Ambulatory Visit (INDEPENDENT_AMBULATORY_CARE_PROVIDER_SITE_OTHER): Payer: Medicare Other | Admitting: Rehabilitative and Restorative Service Providers"

## 2016-06-04 ENCOUNTER — Encounter (INDEPENDENT_AMBULATORY_CARE_PROVIDER_SITE_OTHER): Payer: Self-pay

## 2016-06-04 ENCOUNTER — Encounter: Payer: Self-pay | Admitting: Rehabilitative and Restorative Service Providers"

## 2016-06-04 DIAGNOSIS — R29898 Other symptoms and signs involving the musculoskeletal system: Secondary | ICD-10-CM

## 2016-06-04 DIAGNOSIS — M5442 Lumbago with sciatica, left side: Secondary | ICD-10-CM | POA: Diagnosis not present

## 2016-06-04 NOTE — Patient Instructions (Addendum)
Trunk: Prone Extension (Press-Ups)    Lie on stomach on firm, flat surface. Relax bottom and legs. Raise chest in air with elbows straight. Keep hips flat on surface, sag stomach. Hold _2-3___ seconds. Repeat _10___ times. Do _2-3___ sessions per day. CAUTION: Movement should be gentle and slow.   HIP: Hamstrings - Supine   Place strap around foot. Raise leg up, keeping knee straight.  Bend opposite knee to protect back if indicated. Hold 30 seconds. 3 reps per set, 2-3 sets per day     Outer Hip Stretch: Reclined IT Band Stretch (Strap)   Strap around one foot, pull leg across body until you feel a pull or stretch, with shoulders on mat. Hold for 30 seconds. Repeat 3 times each leg. 2-3 times/day.  Piriformis Stretch   Lying on back, pull right knee toward opposite shoulder. Hold 30 seconds. Repeat 3 times. Do 2-3 sessions per day.   Gastroc Stretch    Stand with right foot back, leg straight, forward leg bent. Keeping heel on floor, turned slightly out, lean into wall until stretch is felt in calf. Hold __30__ seconds. Repeat __3__ times per set.  Do _2-3___ sessions per day.  Achilles / Soleus, Standing    Stand, right foot behind, heel on floor and turned slightly out. Lower hips and bend knees. Hold 30___ seconds. Repeat __2_ times per session. Do _2-3__ sessions per day.   Sleeping on Back  Place pillow under knees. A pillow with cervical support and a roll around waist are also helpful. Copyright  [employer]HI. All rights reserved.  Sleeping on Side Place pillow between knees. Use cervical support under neck and a roll around waist as needed. Copyright  [employer]. All rights reserved.   Sleeping on Stomach   If this is the only desirable sleeping position, place pillow under lower legs, and under stomach or chest as needed.  Posture - Sitting   Sit upright, head facing forward. Try using a roll to support lower back. Keep shoulders relaxed, and avoid rounded  back. Keep hips level with knees. Avoid crossing legs for long periods. Stand to Sit / Sit to Stand   To sit: Bend knees to lower self onto front edge of chair, then scoot back on seat. To stand: Reverse sequence by placing one foot forward, and scoot to front of seat. Use rocking motion to stand up.   Work Height and Reach  Ideal work height is no more than 2 to 4 inches below elbow level when standing, and at elbow level when sitting. Reaching should be limited to arm's length, with elbows slightly bent.  Bending  Bend at hips and knees, not back. Keep feet shoulder-width apart.    Posture - Standing   Good posture is important. Avoid slouching and forward head thrust. Maintain curve in low back and align ears over shoul- ders, hips over ankles.  Alternating Positions   Alternate tasks and change positions frequently to reduce fatigue and muscle tension. Take rest breaks. Computer Work   Position work to Programmer, multimedia. Use proper work and seat height. Keep shoulders back and down, wrists straight, and elbows at right angles. Use chair that provides full back support. Add footrest and lumbar roll as needed.  Getting Into / Out of Car  Lower self onto seat, scoot back, then bring in one leg at a time. Reverse sequence to get out.  Dressing  Lie on back to pull socks or slacks over feet, or sit and bend leg  while keeping back straight.    Housework - Sink  Place one foot on ledge of cabinet under sink when standing at sink for prolonged periods.   Pushing / Pulling  Pushing is preferable to pulling. Keep back in proper alignment, and use leg muscles to do the work.  Deep Squat   Squat and lift with both arms held against upper trunk. Tighten stomach muscles without holding breath. Use smooth movements to avoid jerking.  Avoid Twisting   Avoid twisting or bending back. Pivot around using foot movements, and bend at knees if needed when reaching for  articles.  Carrying Luggage   Distribute weight evenly on both sides. Use a cart whenever possible. Do not twist trunk. Move body as a unit.   Lifting Principles .Maintain proper posture and head alignment. .Slide object as close as possible before lifting. .Move obstacles out of the way. .Test before lifting; ask for help if too heavy. .Tighten stomach muscles without holding breath. .Use smooth movements; do not jerk. .Use legs to do the work, and pivot with feet. .Distribute the work load symmetrically and close to the center of trunk. .Push instead of pull whenever possible.   Ask For Help   Ask for help and delegate to others when possible. Coordinate your movements when lifting together, and maintain the low back curve.  Log Roll   Lying on back, bend left knee and place left arm across chest. Roll all in one movement to the right. Reverse to roll to the left. Always move as one unit. Housework - Sweeping  Use long-handled equipment to avoid stooping.   Housework - Wiping  Position yourself as close as possible to reach work surface. Avoid straining your back.  Laundry - Unloading Wash   To unload small items at bottom of washer, lift leg opposite to arm being used to reach.  [place] close to area to be raked. Use arm movements to do the work. Keep back straight and avoid twisting.     Cart  When reaching into cart with one arm, lift opposite leg to keep back straight.   Getting Into / Out of Bed  Lower self to lie down on one side by raising legs and lowering head at the same time. Use arms to assist moving without twisting. Bend both knees to roll onto back if desired. To sit up, start from lying on side, and use same move-ments in reverse. Housework - Vacuuming  Hold the vacuum with arm held at side. Step back and forth to move it, keeping head up. Avoid twisting.   Laundry - IT consultant so that bending  and twisting can be avoided.   Laundry - Unloading Dryer  Squat down to reach into clothes dryer or use a reacher.  Gardening - Weeding / Probation officer or Kneel. Knee pads may be helpful.                   TENS UNIT: This is helpful for muscle pain and spasm.   Search and Purchase a TENS 7000 2nd edition at www.tenspros.com. It should be less than $30.     TENS unit instructions: Do not shower or bathe with the unit on Turn the unit off before removing electrodes or batteries If the electrodes lose stickiness add a drop of water to the electrodes after they are disconnected from the unit and place on plastic sheet. If you continued to have difficulty,  call the TENS unit company to purchase more electrodes. Do not apply lotion on the skin area prior to use. Make sure the skin is clean and dry as this will help prolong the life of the electrodes. After use, always check skin for unusual red areas, rash or other skin difficulties. If there are any skin problems, does not apply electrodes to the same area. Never remove the electrodes from the unit by pulling the wires. Do not use the TENS unit or electrodes other than as directed. Do not change electrode placement without consultating your therapist or physician. Keep 2 fingers with between each electrode.

## 2016-06-04 NOTE — Therapy (Signed)
[place], [place], [address] Phone: [phone]   Fax:  [phone]  Physical Therapy Evaluation  Patient Details  Name: Timothy Wilcox MRN: [id number] Date of Birth: 10-Nov-1940 Referring Provider: Dr. Asencion Partridge Mayo/Dr. Trenton Gammon   Encounter Date: 06/04/2016      PT End of Session - 06/04/16 1338    Visit Number 1   Number of Visits 12   Date for PT Re-Evaluation 07/16/16   PT Start Time 1020   PT Stop Time 1117   PT Time Calculation (min) 57 min   Activity Tolerance Patient tolerated treatment well      Past Medical History:  Diagnosis Date  . Arthritis   . Gout   . Hyperlipemia   . IBS (irritable bowel syndrome)   . Renal disorder    kidney stones  . Seasonal allergies   . Sinus congestion    chronic  . Sleep apnea    uses a c-pap    Past Surgical History:  Procedure Laterality Date  . COLONOSCOPY    . INCISION AND DRAINAGE  2011   infected finger  . SHOULDER ARTHROSCOPY WITH ROTATOR CUFF REPAIR AND SUBACROMIAL DECOMPRESSION Right 01/29/2013   Procedure: RIGHT SHOULDER ARTHROSCOPY WITH SUBACROMIAL DECOMPRESSION, THREE TENDON ROTATOR CUFF REPAIR;  Surgeon: Cammie Sickle., MD;  Location: [facility];  Service: Orthopedics;  Laterality: Right;  . TOE DEBRIDEMENT     rt toe cyst    There were no vitals filed for this visit.       Subjective Assessment - 06/04/16 1023    Subjective Patient reports that he has had LBP intermittently over the past 20 years with sympotms increased in the past 5-10 years. Symptoms have increased further in the past 4 months. He has LBP and pain radiating into the anterior Lt leg.    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 30 min    How long can you stand comfortably? 15 min    How long can you walk comfortably? 30 min    Diagnostic tests xrays - DDD    Patient  Stated Goals decrease pain and increase mobilty and flexibility    Currently in Pain? Yes   Pain Score 2    Pain Location Back   Pain Orientation Lower;Right;Left   Pain Descriptors / Indicators Dull;Aching   Pain Type Chronic pain   Pain Radiating Towards around Lt hip to the top of the Lt thigh intermittent    Pain Onset More than a month ago   Pain Frequency Intermittent   Aggravating Factors  prolonged sitting or standing; lifting; reaching; bending;    Pain Relieving Factors bending forward; squats at a counter; lying on back bringing knee to chest; OTC antiinflammatory             [facility] PT Assessment - 06/04/16 0001      Assessment   Medical Diagnosis LBP Lt LE radicular pain    Referring Provider Dr. Asencion Partridge Mayo/Dr. D Brooks    Onset Date/Surgical Date 01/07/16   Hand Dominance Right   Next MD Visit 9/17   Prior Therapy none for back      Precautions   Precautions None     Balance Screen   Has the patient fallen in the past 6 months No   Has the patient had a decrease in activity level because of a  fear of falling?  No   Is the patient reluctant to leave their home because of a fear of falling?  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   Leisure woodworking; golf infrequently      Observation/Other Assessments   Focus on Therapeutic Outcomes (FOTO)  46% limitation      Sensation   Additional Comments Lt lateral to anterior thigh intermittently      Posture/Postural Control   Posture Comments head forward; shoudlers rounded; decreased lumbar lordosis; increased thoracic kyphosis     AROM   AROM Assessment Site --  discomfort w/fwd flex; Rt lat flex; Lt rotation    Lumbar Flexion 65%   Lumbar Extension 50%   Lumbar - Right Side Bend 60%   Lumbar - Left Side Bend  60%   Lumbar - Right Rotation 50%   Lumbar - Left Rotation 50%     Strength   Overall Strength Comments 5/5 bilat LE's except hip ext 5-/5      Flexibility   Hamstrings Rt 80 deg; Lt 75 deg    Quadriceps mild tightness bilat    ITB tight Lt >> Rt    Piriformis tight Lt > Rt - difficult to stretch due to need for prolonged stretch to release tightness and cramping of hip adductors      Palpation   Spinal mobility tenderness with CPA mobs L3/4/5    Palpation comment musculat tghtness noted through the Lt lumbar paraspinals; QL; hip abductors                    [facility] Adult PT Treatment/Exercise - 06/04/16 0001      Self-Care   Self-Care --  initiated back care education      Lumbar Exercises: Stretches   Passive Hamstring Stretch 3 reps;30 seconds   Press Ups --  2-3 sec x 10    ITB Stretch 3 reps;30 seconds   Piriformis Stretch 2 reps;30 seconds  PT assist - doifficulty hold for HEP      Moist Heat Therapy   Number Minutes Moist Heat 20 Minutes   Moist Heat Location Lumbar Spine     Electrical Stimulation   Electrical Stimulation Location bilat lumbar paraspinals; Lt hip    Electrical Stimulation Action IFC   Electrical Stimulation Parameters to tolerance   Electrical Stimulation Goals Pain;Tone                PT Education - 06/04/16 1051    Education provided Yes   Education Details HEP back care TENS   Person(s) Educated Patient   Methods Explanation;Demonstration;Tactile cues;Verbal cues;Handout   Comprehension Verbalized understanding;Returned demonstration;Verbal cues required;Tactile cues required             PT Long Term Goals - 06/04/16 1345      PT LONG TERM GOAL #1   Title Improve core strength and stability with patient tolerating 20-30 min of exercise without difficulty 07/16/16   Time 6   Period Weeks   Status New     PT LONG TERM GOAL #2   Title Improve tissue extensibility through the lumbar and hip musculature 07/16/16    Time 6   Period Weeks   Status New  PT LONG TERM GOAL #3   Title Decrease Lt LE radicular pain by 75-100% 07/16/16   Time 6   Period Weeks   Status New     PT LONG TERM GOAL #4   Title Independent in HEP 07/16/16   Time 6   Period Weeks   Status New     PT LONG TERM GOAL #5   Title Imporove FOTO to </= 39% limitation 07/16/16   Time 6   Period Weeks   Status New               Plan - 06/04/16 1339    Clinical Impression Statement Timothy Wilcox presents with recurrent LBP with pain radiating into the Lt hip and anterior thigh. He has poor posture and alignment; limited trunk and LE mobility and ROM; pain with CPA mobs for lumbar spine; muscular tightness through the lumbar paraspinals/QL/lats/Lt hip abductors. He has pain on a daily basis limiting functional activity level.    Rehab Potential Good   PT Frequency 2x / week   PT Duration 6 weeks   PT Treatment/Interventions Patient/family education;ADLs/Self Care Home Management;Neuromuscular re-education;Cryotherapy;Electrical Stimulation;Iontophoresis 4mg /ml Dexamethasone;Moist Heat;Traction;Ultrasound;Manual techniques;Dry needling;Therapeutic activities;Therapeutic exercise   PT Next Visit Plan core stabilization; stretching hip adductors/?flexors; manual work v TDN to Rt lumbar and hip musculature posteriorly; modalities as indicated   Consulted and Agree with Plan of Care Patient      Patient will benefit from skilled therapeutic intervention in order to improve the following deficits and impairments:  Postural dysfunction, Improper body mechanics, Pain, Decreased range of motion, Decreased mobility, Decreased strength, Decreased activity tolerance  Visit Diagnosis: Lumbago with sciatica, left side - Plan: PT plan of care cert/re-cert  Other symptoms and signs involving the musculoskeletal system - Plan: PT plan of care cert/re-cert     Problem List There are no active problems to display for this patient.   [place], MPH  06/04/2016, 2:13 PM  [facility] [place], [place], [address] Phone: [phone]   Fax:  [phone]  Name: Timothy Wilcox MRN: [id number] Date of Birth: 1941-07-02

## 2016-06-06 ENCOUNTER — Encounter: Payer: Self-pay | Admitting: Rehabilitative and Restorative Service Providers"

## 2016-06-06 ENCOUNTER — Ambulatory Visit (INDEPENDENT_AMBULATORY_CARE_PROVIDER_SITE_OTHER): Payer: Medicare Other | Admitting: Rehabilitative and Restorative Service Providers"

## 2016-06-06 DIAGNOSIS — M5442 Lumbago with sciatica, left side: Secondary | ICD-10-CM | POA: Diagnosis not present

## 2016-06-06 DIAGNOSIS — R29898 Other symptoms and signs involving the musculoskeletal system: Secondary | ICD-10-CM | POA: Diagnosis not present

## 2016-06-06 NOTE — Therapy (Signed)
[place], [place], [address] Phone: [phone]   Fax:  [phone]  Physical Therapy Treatment  Patient Details  Name: Timothy Wilcox MRN: [id number] Date of Birth: 1941-03-03 Referring Provider: Dr. Asencion Partridge Mayo/Dr. Trenton Gammon   Encounter Date: 06/06/2016      PT End of Session - 06/06/16 0929    Visit Number 2   Number of Visits 12   Date for PT Re-Evaluation 07/16/16   PT Start Time 0929   PT Stop Time 1024   PT Time Calculation (min) 55 min   Activity Tolerance Patient tolerated treatment well      Past Medical History:  Diagnosis Date  . Arthritis   . Gout   . Hyperlipemia   . IBS (irritable bowel syndrome)   . Renal disorder    kidney stones  . Seasonal allergies   . Sinus congestion    chronic  . Sleep apnea    uses a c-pap    Past Surgical History:  Procedure Laterality Date  . COLONOSCOPY    . INCISION AND DRAINAGE  2011   infected finger  . SHOULDER ARTHROSCOPY WITH ROTATOR CUFF REPAIR AND SUBACROMIAL DECOMPRESSION Right 01/29/2013   Procedure: RIGHT SHOULDER ARTHROSCOPY WITH SUBACROMIAL DECOMPRESSION, THREE TENDON ROTATOR CUFF REPAIR;  Surgeon: Cammie Sickle., MD;  Location: [facility];  Service: Orthopedics;  Laterality: Right;  . TOE DEBRIDEMENT     rt toe cyst    There were no vitals filed for this visit.      Subjective Assessment - 06/06/16 0930    Subjective Patient reports that he did okay with most of the exercises and has been working on the exercises at home. Has a lumbar support and has been using that again. Back felt much better for a few hours after initial treatment.    Currently in Pain? Yes   Pain Score 2    Pain Location Back   Pain Orientation Lower;Right;Left   Pain Descriptors / Indicators Aching;Dull   Pain Type Chronic pain   Pain Onset More than a month ago   Pain Frequency Intermittent                          [facility] Adult PT Treatment/Exercise - 06/06/16 0001      Self-Care   Self-Care --  back care education      Lumbar Exercises: Stretches   Passive Hamstring Stretch 3 reps;30 seconds   Press Ups --  2-3 sec x 10    ITB Stretch 3 reps;30 seconds   Piriformis Stretch 2 reps;30 seconds  PT assist - doifficulty hold for HEP      Moist Heat Therapy   Number Minutes Moist Heat 20 Minutes   Moist Heat Location Lumbar Spine     Electrical Stimulation   Electrical Stimulation Location bilat lumbar paraspinals; Lt hip    Electrical Stimulation Action IFC   Electrical Stimulation Parameters to tolerance   Electrical Stimulation Goals Pain;Tone     Manual Therapy   Manual therapy comments pt prone    Soft tissue mobilization bilat lumbar paraspinals; Lt QL/lats   Myofascial Release lumbar paraspinals           Trigger Point Dry Needling - 06/06/16 1012    Consent Given? Yes   Education Handout Provided Yes   Muscles Treated Lower Body --  bilat lumbar paraspinals/Lt QL - dec tightness to  palpation              PT Education - 06/06/16 [phone]    Education provided Yes   Education Details TDN   Person(s) Educated Patient   Methods Explanation   Comprehension Verbalized understanding             PT Long Term Goals - 06/06/16 0930      PT LONG TERM GOAL #1   Title Improve core strength and stability with patient tolerating 20-30 min of exercise without difficulty 07/16/16   Time 6   Period Weeks   Status On-going     PT LONG TERM GOAL #2   Title Improve tissue extensibility through the lumbar and hip musculature 07/16/16   Time 6   Period Weeks   Status On-going     PT LONG TERM GOAL #3   Title Decrease Lt LE radicular pain by 75-100% 07/16/16   Time 6   Period Weeks   Status On-going     PT LONG TERM GOAL #4   Title Independent in HEP 07/16/16   Time 6   Period Weeks   Status On-going     PT LONG TERM GOAL #5    Title Imporove FOTO to </= 39% limitation 07/16/16   Time 6   Period Weeks   Status On-going             Patient will benefit from skilled therapeutic intervention in order to improve the following deficits and impairments:     Visit Diagnosis: Lumbago with sciatica, left side  Other symptoms and signs involving the musculoskeletal system     Problem List There are no active problems to display for this patient.   Celyn Nilda Simmer PT, MPH  06/06/2016, 10:14 AM  [facility] [place], [place], [address] Phone: [phone]   Fax:  [phone]  Name: Timothy Wilcox MRN: [id number] Date of Birth: August 14, 1941

## 2016-06-06 NOTE — Patient Instructions (Signed)

## 2016-06-12 ENCOUNTER — Ambulatory Visit (INDEPENDENT_AMBULATORY_CARE_PROVIDER_SITE_OTHER): Payer: Medicare Other | Admitting: Rehabilitative and Restorative Service Providers"

## 2016-06-12 ENCOUNTER — Encounter: Payer: Self-pay | Admitting: Rehabilitative and Restorative Service Providers"

## 2016-06-12 DIAGNOSIS — M5442 Lumbago with sciatica, left side: Secondary | ICD-10-CM | POA: Diagnosis not present

## 2016-06-12 DIAGNOSIS — R29898 Other symptoms and signs involving the musculoskeletal system: Secondary | ICD-10-CM

## 2016-06-12 NOTE — Patient Instructions (Addendum)
Bridging    Slowly raise buttocks from floor, keeping core tight. Repeat _5-10___ times per set. Do __1-2__ sets per session. Do _1___ sessions per day.      Heel Walk (Hook-Lying)    Tighten core and slowly walk feet forward in short steps until legs are nearly straight, or until back begins to arch. Repeat __10__ times per set. Do _1-2___ sets per session. Do __1__ sessions per day.     Combination (Hook-Lying)    Tighten core  and slowly raise left leg and lower opposite arm over head. Keep trunk rigid. Repeat __10__ times per set. Do __1-2__ sets per session. Do __1__ sessions per day.    Strengthening: Hip Abductor - Resisted    With band looped around both legs above knees, push thighs apart. Repeat _10___ times per set. Do _1-2___ sets per session. Do __1__ sessions per day.

## 2016-06-12 NOTE — Therapy (Signed)
[place], [place], [address] Phone: [phone]   Fax:  [phone]  Physical Therapy Treatment  Patient Details  Name: Timothy Wilcox MRN: [id number] Date of Birth: 1941/08/16 Referring Provider: Dr. Asencion Partridge Mayo/Dr. Trenton Gammon   Encounter Date: 06/12/2016      PT End of Session - 06/12/16 1106    Visit Number 3   Number of Visits 12   Date for PT Re-Evaluation 07/16/16   PT Start Time 1103   PT Stop Time 1158   PT Time Calculation (min) 55 min   Activity Tolerance Patient tolerated treatment well      Past Medical History:  Diagnosis Date  . Arthritis   . Gout   . Hyperlipemia   . IBS (irritable bowel syndrome)   . Renal disorder    kidney stones  . Seasonal allergies   . Sinus congestion    chronic  . Sleep apnea    uses a c-pap    Past Surgical History:  Procedure Laterality Date  . COLONOSCOPY    . INCISION AND DRAINAGE  2011   infected finger  . SHOULDER ARTHROSCOPY WITH ROTATOR CUFF REPAIR AND SUBACROMIAL DECOMPRESSION Right 01/29/2013   Procedure: RIGHT SHOULDER ARTHROSCOPY WITH SUBACROMIAL DECOMPRESSION, THREE TENDON ROTATOR CUFF REPAIR;  Surgeon: Cammie Sickle., MD;  Location: [facility];  Service: Orthopedics;  Laterality: Right;  . TOE DEBRIDEMENT     rt toe cyst    There were no vitals filed for this visit.      Subjective Assessment - 06/12/16 1108    Subjective Feeling looser and better - not sure if that's the needling or his exercises.    Pain Score 1    Pain Location Back   Pain Orientation Lower;Right;Left   Pain Descriptors / Indicators Aching   Pain Onset More than a month ago   Pain Frequency Intermittent  constant ache is no longer there                          [facility] Adult PT Treatment/Exercise - 06/12/16 0001      Lumbar Exercises: Stretches   Passive Hamstring Stretch 3 reps;30 seconds   Press Ups --   2-3 sec x 10    ITB Stretch 3 reps;30 seconds   Piriformis Stretch 2 reps;30 seconds  PT assist - doifficulty hold for HEP      Lumbar Exercises: Aerobic   Stationary Bike Nustep L5 x 5 min      Lumbar Exercises: Supine   Ab Set --  3 part core 10 sec x 10    Clam 10 reps;2 seconds  engaging core    Bridge 10 reps;2 seconds  engaging core - avoiding pain    Other Supine Lumbar Exercises alt arm and leg with core engaged x 10    Other Supine Lumbar Exercises walking heel out alt feet x 8      Electrical Stimulation   Electrical Stimulation Location bilat lumbar paraspinals; Lt hip    Electrical Stimulation Action IFC   Electrical Stimulation Parameters to tolerance   Electrical Stimulation Goals Pain;Tone     Manual Therapy   Manual therapy comments pt prone    Soft tissue mobilization bilat lumbar paraspinals; Lt/Rt QL/lats   Myofascial Release lumbar paraspinals                 PT Education - 06/12/16 1130  Education provided Yes   Education Details HEP    Person(s) Educated Patient   Methods Explanation;Demonstration;Tactile cues;Verbal cues;Handout   Comprehension Verbalized understanding;Returned demonstration;Verbal cues required;Tactile cues required             PT Long Term Goals - 06/12/16 1107      PT LONG TERM GOAL #1   Title Improve core strength and stability with patient tolerating 20-30 min of exercise without difficulty 07/16/16   Time 6   Period Weeks   Status On-going     PT LONG TERM GOAL #2   Title Improve tissue extensibility through the lumbar and hip musculature 07/16/16   Time 6   Period Weeks   Status On-going     PT LONG TERM GOAL #3   Title Decrease Lt LE radicular pain by 75-100% 07/16/16   Time 6   Period Weeks   Status On-going     PT LONG TERM GOAL #4   Title Independent in HEP 07/16/16   Time 6   Period Weeks   Status On-going     PT LONG TERM GOAL #5   Title Imporove FOTO to </= 39% limitation 07/16/16    Time 6   Period Weeks   Status On-going               Plan - 06/12/16 1149    Clinical Impression Statement Imporving mobility with increased spinal mobilty noted with mobs and soft tissue work. Persistent tightness noted through lumbar paraspinals and OL's. Progressing well toward stated goals of therapy.    Rehab Potential Good   PT Frequency 2x / week   PT Duration 6 weeks   PT Treatment/Interventions Patient/family education;ADLs/Self Care Home Management;Neuromuscular re-education;Cryotherapy;Electrical Stimulation;Iontophoresis 4mg /ml Dexamethasone;Moist Heat;Traction;Ultrasound;Manual techniques;Dry needling;Therapeutic activities;Therapeutic exercise   PT Next Visit Plan core stabilization; stretching hip adductors/?flexors; manual work v TDN to Rt lumbar and hip musculature posteriorly; modalities as indicated   Consulted and Agree with Plan of Care Patient      Patient will benefit from skilled therapeutic intervention in order to improve the following deficits and impairments:  Postural dysfunction, Improper body mechanics, Pain, Decreased range of motion, Decreased mobility, Decreased strength, Decreased activity tolerance  Visit Diagnosis: Lumbago with sciatica, left side  Other symptoms and signs involving the musculoskeletal system     Problem List There are no active problems to display for this patient.   [place], MPH 06/12/2016, 11:52 AM  [facility] [place], [place], [address] Phone: [phone]   Fax:  [phone]  Name: Timothy Wilcox MRN: [id number] Date of Birth: 12-30-1940

## 2016-06-15 ENCOUNTER — Encounter: Payer: Self-pay | Admitting: Rehabilitative and Restorative Service Providers"

## 2016-06-15 ENCOUNTER — Ambulatory Visit (INDEPENDENT_AMBULATORY_CARE_PROVIDER_SITE_OTHER): Payer: Medicare Other | Admitting: Rehabilitative and Restorative Service Providers"

## 2016-06-15 DIAGNOSIS — R29898 Other symptoms and signs involving the musculoskeletal system: Secondary | ICD-10-CM | POA: Diagnosis not present

## 2016-06-15 DIAGNOSIS — M5442 Lumbago with sciatica, left side: Secondary | ICD-10-CM | POA: Diagnosis not present

## 2016-06-15 NOTE — Therapy (Signed)
[place], [place], [address] Phone: [phone]   Fax:  [phone]  Physical Therapy Treatment  Patient Details  Name: Timothy Wilcox MRN: [id number] Date of Birth: 21-Apr-1941 Referring Provider: Dr. Asencion Partridge Mayo/Dr. Trenton Gammon   Encounter Date: 06/15/2016      PT End of Session - 06/15/16 1113    Visit Number 4   Number of Visits 12   Date for PT Re-Evaluation 07/16/16   PT Start Time 1102   PT Stop Time 1158   PT Time Calculation (min) 56 min   Activity Tolerance Patient tolerated treatment well      Past Medical History:  Diagnosis Date  . Arthritis   . Gout   . Hyperlipemia   . IBS (irritable bowel syndrome)   . Renal disorder    kidney stones  . Seasonal allergies   . Sinus congestion    chronic  . Sleep apnea    uses a c-pap    Past Surgical History:  Procedure Laterality Date  . COLONOSCOPY    . INCISION AND DRAINAGE  2011   infected finger  . SHOULDER ARTHROSCOPY WITH ROTATOR CUFF REPAIR AND SUBACROMIAL DECOMPRESSION Right 01/29/2013   Procedure: RIGHT SHOULDER ARTHROSCOPY WITH SUBACROMIAL DECOMPRESSION, THREE TENDON ROTATOR CUFF REPAIR;  Surgeon: Cammie Sickle., MD;  Location: [facility];  Service: Orthopedics;  Laterality: Right;  . TOE DEBRIDEMENT     rt toe cyst    There were no vitals filed for this visit.      Subjective Assessment - 06/15/16 1114    Subjective Some set back yesterday. Had increased pain in the Rt low back. Stretching seemed to pull more but he did loosen up some as the day went on   Currently in Pain? Yes   Pain Score 3    Pain Location Back   Pain Orientation Lower;Right;Left   Pain Descriptors / Indicators Aching   Pain Type Chronic pain   Pain Onset More than a month ago   Pain Frequency Intermittent                         [facility] Adult PT Treatment/Exercise - 06/15/16 0001      Lumbar Exercises:  Stretches   Passive Hamstring Stretch 3 reps;30 seconds   Press Ups --  2-3 sec x 10    ITB Stretch 3 reps;30 seconds   Piriformis Stretch 2 reps;30 seconds  PT assist - doifficulty hold for HEP      Lumbar Exercises: Aerobic   Stationary Bike Nustep L5 x 5 min      Lumbar Exercises: Supine   Clam 10 reps;2 seconds  engaging core    Bridge 10 reps;2 seconds  engaging core - avoiding pain    Other Supine Lumbar Exercises alt arm and leg with core engaged x 10    Other Supine Lumbar Exercises walking heel out alt feet x 8      Moist Heat Therapy   Number Minutes Moist Heat 20 Minutes   Moist Heat Location Lumbar Spine     Electrical Stimulation   Electrical Stimulation Location bilat lumbar paraspinals; Lt hip    Electrical Stimulation Action IFC   Electrical Stimulation Parameters to tolerance   Electrical Stimulation Goals Pain;Tone     Ultrasound   Ultrasound Location Rt lower lumbar paraspinals    Ultrasound Parameters 100%; 1.5 w/cm2; 1 mHz; 8 min  Ultrasound Goals Pain;Other (Comment)  tightness      Manual Therapy   Manual therapy comments pt prone    Soft tissue mobilization bilat lumbar paraspinals; Lt/Rt QL/lats   Myofascial Release lumbar paraspinals           Trigger Point Dry Needling - 06/15/16 1155    Consent Given? Yes   Muscles Treated Lower Body --  Rt lower lumbar paraspinals x 1 decreased tightness                    PT Long Term Goals - 06/12/16 1107      PT LONG TERM GOAL #1   Title Improve core strength and stability with patient tolerating 20-30 min of exercise without difficulty 07/16/16   Time 6   Period Weeks   Status On-going     PT LONG TERM GOAL #2   Title Improve tissue extensibility through the lumbar and hip musculature 07/16/16   Time 6   Period Weeks   Status On-going     PT LONG TERM GOAL #3   Title Decrease Lt LE radicular pain by 75-100% 07/16/16   Time 6   Period Weeks   Status On-going     PT LONG  TERM GOAL #4   Title Independent in HEP 07/16/16   Time 6   Period Weeks   Status On-going     PT LONG TERM GOAL #5   Title Imporove FOTO to </= 39% limitation 07/16/16   Time 6   Period Weeks   Status On-going               Plan - 06/15/16 1126    Clinical Impression Statement Flare up of Rt LB pain yesterday. Not sure of any reason symptoms would have increased. Tightness noted Rt lower lumbar araspinals just proximal to SI area.    Rehab Potential Good   PT Frequency 2x / week   PT Duration 6 weeks   PT Treatment/Interventions Patient/family education;ADLs/Self Care Home Management;Neuromuscular re-education;Cryotherapy;Electrical Stimulation;Iontophoresis 4mg /ml Dexamethasone;Moist Heat;Traction;Ultrasound;Manual techniques;Dry needling;Therapeutic activities;Therapeutic exercise   PT Next Visit Plan core stabilization; stretching hip adductors/?flexors; manual work v TDN to Rt lumbar and hip musculature posteriorly; assess response to [place] and TDN Rt lower lumbar/above SI   Consulted and Agree with Plan of Care Patient      Patient will benefit from skilled therapeutic intervention in order to improve the following deficits and impairments:  Postural dysfunction, Improper body mechanics, Pain, Decreased range of motion, Decreased mobility, Decreased strength, Decreased activity tolerance  Visit Diagnosis: Lumbago with sciatica, left side  Other symptoms and signs involving the musculoskeletal system     Problem List There are no active problems to display for this patient.   [place], MPH  06/15/2016, 11:57 AM  [facility] [place], [place], [address] Phone: [phone]   Fax:  [phone]  Name: Timothy Wilcox MRN: [id number] Date of Birth: 01-17-1941

## 2016-06-19 ENCOUNTER — Ambulatory Visit (INDEPENDENT_AMBULATORY_CARE_PROVIDER_SITE_OTHER): Payer: Medicare Other | Admitting: Rehabilitative and Restorative Service Providers"

## 2016-06-19 ENCOUNTER — Encounter: Payer: Self-pay | Admitting: Rehabilitative and Restorative Service Providers"

## 2016-06-19 DIAGNOSIS — M5442 Lumbago with sciatica, left side: Secondary | ICD-10-CM | POA: Diagnosis not present

## 2016-06-19 DIAGNOSIS — R29898 Other symptoms and signs involving the musculoskeletal system: Secondary | ICD-10-CM | POA: Diagnosis not present

## 2016-06-19 NOTE — Therapy (Signed)
[place], [place], [address] Phone: [phone]   Fax:  [phone]  Physical Therapy Treatment  Patient Details  Name: Timothy Wilcox MRN: [id number] Date of Birth: September 24, 1941 Referring Provider: dr Asencion Partridge Mayo/Dr D Rolena Infante  Encounter Date: 06/19/2016      PT End of Session - 06/19/16 1113    Visit Number 5   Number of Visits 12   Date for PT Re-Evaluation 07/16/16   PT Start Time 1058   PT Stop Time 1152   PT Time Calculation (min) 54 min   Activity Tolerance Patient tolerated treatment well      Past Medical History:  Diagnosis Date  . Arthritis   . Gout   . Hyperlipemia   . IBS (irritable bowel syndrome)   . Renal disorder    kidney stones  . Seasonal allergies   . Sinus congestion    chronic  . Sleep apnea    uses a c-pap    Past Surgical History:  Procedure Laterality Date  . COLONOSCOPY    . INCISION AND DRAINAGE  2011   infected finger  . SHOULDER ARTHROSCOPY WITH ROTATOR CUFF REPAIR AND SUBACROMIAL DECOMPRESSION Right 01/29/2013   Procedure: RIGHT SHOULDER ARTHROSCOPY WITH SUBACROMIAL DECOMPRESSION, THREE TENDON ROTATOR CUFF REPAIR;  Surgeon: Cammie Sickle., MD;  Location: [facility];  Service: Orthopedics;  Laterality: Right;  . TOE DEBRIDEMENT     rt toe cyst    There were no vitals filed for this visit.          [facility] PT Assessment - 06/19/16 0001      Assessment   Medical Diagnosis LBP Lt LE radicular pain    Referring Provider dr Asencion Partridge Mayo/Dr D Rolena Infante   Onset Date/Surgical Date 01/07/16   Hand Dominance Right   Next MD Visit 9/17     Flexibility   Hamstrings Rt 85 deg; Lt 80 deg    Quadriceps WFL's   ITB tight Lt/ Rt    Piriformis tight Lt/Rt      Palpation   Spinal mobility mild tenderness with CPA mobs L3/4/5    Palpation comment musculat tghtness noted through the Rt lumbar paraspinals; QL; hip abductors                       [facility] Adult PT Treatment/Exercise - 06/19/16 0001      Lumbar Exercises: Stretches   Passive Hamstring Stretch 3 reps;30 seconds   Press Ups --  2-3 sec x 10    Quadruped Mid Back Stretch 3 reps;20 seconds  LB stretch - child's pose and child's pose with Lt lat flex    ITB Stretch 3 reps;30 seconds   Piriformis Stretch 2 reps;30 seconds  PT assist - doifficulty hold for HEP      Lumbar Exercises: Aerobic   Stationary Bike Nustep L6 x 5 min      Lumbar Exercises: Supine   Bridge 10 reps;2 seconds  engaging core - avoiding pain      Lumbar Exercises: Quadruped   Madcat/Old Horse 5 reps     Moist Heat Therapy   Number Minutes Moist Heat 20 Minutes   Moist Heat Location Lumbar Spine     Electrical Stimulation   Electrical Stimulation Location bilat lumbar; Rt L5/S1/sacral border   Electrical Stimulation Action IFC   Electrical Stimulation Parameters to tolerance   Electrical Stimulation Goals Pain;Tone  PT Education - 06/19/16 1142    Education provided Yes   Education Details HEP    Person(s) Educated Patient   Methods Explanation;Tactile cues;Verbal cues;Handout   Comprehension Verbalized understanding;Returned demonstration;Verbal cues required;Tactile cues required             PT Long Term Goals - 06/19/16 1151      PT LONG TERM GOAL #1   Title Improve core strength and stability with patient tolerating 20-30 min of exercise without difficulty 07/16/16   Time 6   Period Weeks   Status On-going     PT LONG TERM GOAL #2   Title Improve tissue extensibility through the lumbar and hip musculature 07/16/16   Period Weeks   Status On-going     PT LONG TERM GOAL #3   Title Decrease Lt LE radicular pain by 75-100% 07/16/16   Time 6   Period Weeks   Status On-going     PT LONG TERM GOAL #4   Title Independent in HEP 07/16/16   Time 6   Period Weeks   Status On-going     PT LONG TERM GOAL #5   Title  Imporove FOTO to </= 39% limitation 07/16/16   Time 6   Period Weeks   Status On-going               Plan - 06/19/16 1148    Clinical Impression Statement Area in Rt LB still flared up. That is the area that has been painful for 4-5 years. ?Can feel the stretch with cat cow and lat trunk stretch in child's pose. Continued tightness in lower Rt quadrant of lumbar spine - area of SI/crest of the pelvis/L5. Deep tightness. Decreased segmental spinal mobility through that area.    Rehab Potential Good   PT Frequency 2x / week   PT Duration 6 weeks   PT Treatment/Interventions Patient/family education;ADLs/Self Care Home Management;Neuromuscular re-education;Cryotherapy;Electrical Stimulation;Iontophoresis 4mg /ml Dexamethasone;Moist Heat;Traction;Ultrasound;Manual techniques;Dry needling;Therapeutic activities;Therapeutic exercise   PT Next Visit Plan core stabilization; stretching hip adductors/?flexors; manual work v TDN to Rt lumbar and hip musculature posteriorly; assess response to specific stretches for Rt lower lumbar/above SI. Add sitting work for segmental mobilty for lumbar spine.    Consulted and Agree with Plan of Care Patient      Patient will benefit from skilled therapeutic intervention in order to improve the following deficits and impairments:  Postural dysfunction, Improper body mechanics, Pain, Decreased range of motion, Decreased mobility, Decreased strength, Decreased activity tolerance  Visit Diagnosis: Lumbago with sciatica, left side  Other symptoms and signs involving the musculoskeletal system     Problem List There are no active problems to display for this patient.   Timothy Wilcox Timothy Wilcox PT, MPH  06/19/2016, 11:56 AM  [facility] [place], [place], [address] Phone: [phone]   Fax:  [phone]  Name: Timothy Wilcox MRN: [id number] Date of Birth: 1941-10-04

## 2016-06-19 NOTE — Patient Instructions (Addendum)
Cat / Cow Flow    Inhale, press spine toward ceiling like a Halloween cat. Keeping strength in arms and abdominals, exhale to soften spine through neutral and into cow pose. Open chest and arch back. Initiate movement between cat and cow at tailbone, one vertebrae at a time. Repeat __5-10__ times.   Side Waist Stretch from Child's Pose    From child's pose, walk hands to left. Reach right hand out on diagonal. Reach hips back toward heels making a C with torso. Breathe into right side waist. Hold for __30 sec. Repeat __2-3 __ times to left side stretching the right.

## 2016-06-22 ENCOUNTER — Encounter: Payer: Self-pay | Admitting: Rehabilitative and Restorative Service Providers"

## 2016-06-22 ENCOUNTER — Ambulatory Visit (INDEPENDENT_AMBULATORY_CARE_PROVIDER_SITE_OTHER): Payer: Medicare Other | Admitting: Rehabilitative and Restorative Service Providers"

## 2016-06-22 DIAGNOSIS — M5442 Lumbago with sciatica, left side: Secondary | ICD-10-CM | POA: Diagnosis not present

## 2016-06-22 DIAGNOSIS — R29898 Other symptoms and signs involving the musculoskeletal system: Secondary | ICD-10-CM | POA: Diagnosis not present

## 2016-06-22 NOTE — Therapy (Signed)
[place], [place], [address] Phone: [phone]   Fax:  [phone]  Physical Therapy Treatment  Patient Details  Name: Timothy Wilcox MRN: [id number] Date of Birth: 30-May-1941 Referring Provider: dr Asencion Partridge Mayo/Dr D Rolena Infante  Encounter Date: 06/22/2016      PT End of Session - 06/22/16 1101    Visit Number 6   Number of Visits 12   Date for PT Re-Evaluation 07/16/16   PT Start Time 1100   PT Stop Time 1153   PT Time Calculation (min) 53 min   Activity Tolerance Patient tolerated treatment well      Past Medical History:  Diagnosis Date  . Arthritis   . Gout   . Hyperlipemia   . IBS (irritable bowel syndrome)   . Renal disorder    kidney stones  . Seasonal allergies   . Sinus congestion    chronic  . Sleep apnea    uses a c-pap    Past Surgical History:  Procedure Laterality Date  . COLONOSCOPY    . INCISION AND DRAINAGE  2011   infected finger  . SHOULDER ARTHROSCOPY WITH ROTATOR CUFF REPAIR AND SUBACROMIAL DECOMPRESSION Right 01/29/2013   Procedure: RIGHT SHOULDER ARTHROSCOPY WITH SUBACROMIAL DECOMPRESSION, THREE TENDON ROTATOR CUFF REPAIR;  Surgeon: Cammie Sickle., MD;  Location: [facility];  Service: Orthopedics;  Laterality: Right;  . TOE DEBRIDEMENT     rt toe cyst    There were no vitals filed for this visit.      Subjective Assessment - 06/22/16 1140    Subjective Thinks the current stretch is "getting to the spot" Very sore for a couple of days after last treatment but now moving and feeling better. Still working on the last stretches and feels they will help.    Currently in Pain? Yes   Pain Score 3    Pain Location Back   Pain Orientation Lower;Right;Left   Pain Descriptors / Indicators Aching;Tightness   Pain Type Chronic pain   Pain Onset More than a month ago   Pain Frequency Intermittent                         [facility]  Adult PT Treatment/Exercise - 06/22/16 0001      Lumbar Exercises: Stretches   Passive Hamstring Stretch 3 reps;30 seconds   Quadruped Mid Back Stretch 3 reps;20 seconds  LB stretch - child's pose and child's pose with Lt lat flex    ITB Stretch 3 reps;30 seconds   Piriformis Stretch 2 reps;30 seconds  PT assist - doifficulty hold for HEP      Lumbar Exercises: Aerobic   Stationary Bike Nustep L6 x 5 min      Lumbar Exercises: Supine   Bridge 10 reps;2 seconds  engaging core - avoiding pain      Lumbar Exercises: Quadruped   Madcat/Old Horse 5 reps     Moist Heat Therapy   Number Minutes Moist Heat 20 Minutes   Moist Heat Location Lumbar Spine     Electrical Stimulation   Electrical Stimulation Location bilat lumbar; Rt L5/S1/sacral border   Electrical Stimulation Action IFC   Electrical Stimulation Parameters to tolerance   Electrical Stimulation Goals Pain;Tone     Ultrasound   Ultrasound Location Rt lower lumbar and QL    Ultrasound Parameters 100%; 1.2 w/cm2; 1 mHz; 10 min      Manual Therapy  Manual therapy comments pt prone    Soft tissue mobilization bilat lumbar paraspinals; Lt/Rt QL/lats   Myofascial Release lumbar paraspinals                      PT Long Term Goals - 06/19/16 1151      PT LONG TERM GOAL #1   Title Improve core strength and stability with patient tolerating 20-30 min of exercise without difficulty 07/16/16   Time 6   Period Weeks   Status On-going     PT LONG TERM GOAL #2   Title Improve tissue extensibility through the lumbar and hip musculature 07/16/16   Period Weeks   Status On-going     PT LONG TERM GOAL #3   Title Decrease Lt LE radicular pain by 75-100% 07/16/16   Time 6   Period Weeks   Status On-going     PT LONG TERM GOAL #4   Title Independent in HEP 07/16/16   Time 6   Period Weeks   Status On-going     PT LONG TERM GOAL #5   Title Imporove FOTO to </= 39% limitation 07/16/16   Time 6   Period Weeks    Status On-going               Plan - 06/22/16 1145    Clinical Impression Statement Good response to last treatment and HEP - patient encouraged that the HEP is "getting to the spot" - the area that has been tight and painful for so long. Note improved segmental mobilty through the lower lumbar spine. Gradually progressing toward goals of therapy.    Rehab Potential Good   PT Frequency 2x / week   PT Duration 6 weeks   PT Treatment/Interventions Patient/family education;ADLs/Self Care Home Management;Neuromuscular re-education;Cryotherapy;Electrical Stimulation;Iontophoresis 4mg /ml Dexamethasone;Moist Heat;Traction;Ultrasound;Manual techniques;Dry needling;Therapeutic activities;Therapeutic exercise   PT Next Visit Plan core stabilization; stretching hip adductors/?flexors; manual work v TDN to Rt lumbar and hip musculature posteriorly; assess response to specific stretches for Rt lower lumbar/above SI. Add sitting work for segmental mobilty for lumbar spine.    Consulted and Agree with Plan of Care Patient      Patient will benefit from skilled therapeutic intervention in order to improve the following deficits and impairments:  Postural dysfunction, Improper body mechanics, Pain, Decreased range of motion, Decreased mobility, Decreased strength, Decreased activity tolerance  Visit Diagnosis: Lumbago with sciatica, left side  Other symptoms and signs involving the musculoskeletal system     Problem List There are no active problems to display for this patient.   Powell, MPH  06/22/2016, 11:48 AM  [facility] [place], [place], [address] Phone: [phone]   Fax:  [phone]  Name: Timothy Wilcox MRN: [id number] Date of Birth: 1941-02-18

## 2016-06-26 ENCOUNTER — Ambulatory Visit (INDEPENDENT_AMBULATORY_CARE_PROVIDER_SITE_OTHER): Payer: Medicare Other | Admitting: Rehabilitative and Restorative Service Providers"

## 2016-06-26 ENCOUNTER — Encounter: Payer: Self-pay | Admitting: Rehabilitative and Restorative Service Providers"

## 2016-06-26 DIAGNOSIS — M5442 Lumbago with sciatica, left side: Secondary | ICD-10-CM

## 2016-06-26 DIAGNOSIS — R29898 Other symptoms and signs involving the musculoskeletal system: Secondary | ICD-10-CM | POA: Diagnosis not present

## 2016-06-26 NOTE — Therapy (Signed)
[place], [place], [address] Phone: [phone]   Fax:  [phone]  Physical Therapy Treatment  Patient Details  Name: Timothy Wilcox MRN: [id number] Date of Birth: Sep 01, 1941 Referring Provider: dr Asencion Partridge Mayo/Dr D Rolena Infante  Encounter Date: 06/26/2016      PT End of Session - 06/26/16 1109    Visit Number 7   Number of Visits 12   Date for PT Re-Evaluation 07/16/16   PT Start Time 1103   PT Stop Time 1153   PT Time Calculation (min) 50 min   Activity Tolerance Patient tolerated treatment well      Past Medical History:  Diagnosis Date  . Arthritis   . Gout   . Hyperlipemia   . IBS (irritable bowel syndrome)   . Renal disorder    kidney stones  . Seasonal allergies   . Sinus congestion    chronic  . Sleep apnea    uses a c-pap    Past Surgical History:  Procedure Laterality Date  . COLONOSCOPY    . INCISION AND DRAINAGE  2011   infected finger  . SHOULDER ARTHROSCOPY WITH ROTATOR CUFF REPAIR AND SUBACROMIAL DECOMPRESSION Right 01/29/2013   Procedure: RIGHT SHOULDER ARTHROSCOPY WITH SUBACROMIAL DECOMPRESSION, THREE TENDON ROTATOR CUFF REPAIR;  Surgeon: Cammie Sickle., MD;  Location: [facility];  Service: Orthopedics;  Laterality: Right;  . TOE DEBRIDEMENT     rt toe cyst    There were no vitals filed for this visit.      Subjective Assessment - 06/26/16 1128    Subjective Stretching seems to be making him sore and have a deep ache all day. Feels the best when he does not stretch the morning he comes in to PT. Stretch he feels the most is the lateral flexion in childs pose. likes the ant/post pelvic tilt in sitting on disc.  Modified the quad stretch. using the TENS unit which does help.    Currently in Pain? Yes   Pain Score 4    Pain Location Back   Pain Orientation Right;Lower;Left   Pain Descriptors / Indicators Aching;Tightness   Pain Type Chronic  pain   Pain Onset More than a month ago   Pain Frequency Intermittent                         [facility] Adult PT Treatment/Exercise - 06/26/16 0001      Therapeutic Activites    Therapeutic Activities --  supine lying with sacrum on blue TB cushion for ~3-4 min      Neuro Re-ed    Neuro Re-ed Details  --  working on segmental mobility sitting ant/post pelvic tilt     Lumbar Exercises: Stretches   Passive Hamstring Stretch 3 reps;30 seconds   Quad Stretch 3 reps;30 seconds  with knee supported to extend hip    Quad Stretch Limitations has been stretching quad in sidelying which shortens the lumbar on the side being stretched(top) did feel the prone quad stretch in LB    ITB Stretch 3 reps;30 seconds   Piriformis Stretch 2 reps;30 seconds  PT assist - doifficulty hold for HEP      Lumbar Exercises: Aerobic   Stationary Bike Nustep L5 x 5 min      Lumbar Exercises: Supine   Bridge 10 reps;2 seconds  engaging core - avoiding pain      Moist Heat Therapy  Number Minutes Moist Heat 20 Minutes   Moist Heat Location Lumbar Spine     Electrical Stimulation   Electrical Stimulation Location bilat lumbar; Rt L5/S1/sacral border   Electrical Stimulation Action IFC   Electrical Stimulation Parameters to tolerance   Electrical Stimulation Goals Pain;Tone                     PT Long Term Goals - 06/26/16 1157      PT LONG TERM GOAL #1   Title Improve core strength and stability with patient tolerating 20-30 min of exercise without difficulty 07/16/16   Time 6   Period Weeks   Status On-going     PT LONG TERM GOAL #2   Title Improve tissue extensibility through the lumbar and hip musculature 07/16/16   Time 6   Period Weeks   Status On-going     PT LONG TERM GOAL #3   Title Decrease Lt LE radicular pain by 75-100% 07/16/16   Time 6   Period Weeks   Status On-going     PT LONG TERM GOAL #4   Title Independent in HEP 07/16/16   Time 6   Period  Weeks   Status On-going     PT LONG TERM GOAL #5   Title Imporove FOTO to </= 39% limitation 07/16/16   Time 6   Period Weeks   Status On-going               Plan - 06/26/16 1151    Clinical Impression Statement Rt LB is staying sore and aching after the stretching exercises - especially lateral flexion in childs pose. Good segmental mobility through lumbar spine with ant/post pelvic tilt on cushion. Continues to be locked into anterior pelvic tilt in standing and sitting.    Rehab Potential Good   PT Frequency 2x / week   PT Duration 6 weeks   PT Treatment/Interventions Patient/family education;ADLs/Self Care Home Management;Neuromuscular re-education;Cryotherapy;Electrical Stimulation;Iontophoresis 4mg /ml Dexamethasone;Moist Heat;Traction;Ultrasound;Manual techniques;Dry needling;Therapeutic activities;Therapeutic exercise   PT Next Visit Plan core stabilization; stretching hip adductors/?flexors; manual work v TDN to Rt lumbar and hip musculature posteriorly; assess response to specific stretches for Rt lower lumbar/above SI. Add sitting work for segmental mobilty for lumbar spine. Will try to hold some of the more uncomfortable stretches for a couple of days to monoter the response and see if the aching soreness will subside. May be stretching too hard in tissues that are shortened.    Consulted and Agree with Plan of Care Patient      Patient will benefit from skilled therapeutic intervention in order to improve the following deficits and impairments:  Postural dysfunction, Improper body mechanics, Pain, Decreased range of motion, Decreased mobility, Decreased strength, Decreased activity tolerance  Visit Diagnosis: Lumbago with sciatica, left side  Other symptoms and signs involving the musculoskeletal system     Problem List There are no active problems to display for this patient.   [place], MPH  06/26/2016, 11:57 AM  [facility] [place], [place], [address] Phone: [phone]   Fax:  [phone]  Name: Timothy Wilcox MRN: [id number] Date of Birth: Dec 08, 1940

## 2016-06-29 ENCOUNTER — Encounter: Payer: Medicare Other | Admitting: Rehabilitative and Restorative Service Providers"

## 2016-07-04 ENCOUNTER — Encounter: Payer: Self-pay | Admitting: Physical Therapy

## 2016-07-06 ENCOUNTER — Ambulatory Visit (INDEPENDENT_AMBULATORY_CARE_PROVIDER_SITE_OTHER): Payer: Medicare Other | Admitting: Rehabilitative and Restorative Service Providers"

## 2016-07-06 ENCOUNTER — Encounter: Payer: Self-pay | Admitting: Rehabilitative and Restorative Service Providers"

## 2016-07-06 DIAGNOSIS — M5442 Lumbago with sciatica, left side: Secondary | ICD-10-CM | POA: Diagnosis not present

## 2016-07-06 DIAGNOSIS — R29898 Other symptoms and signs involving the musculoskeletal system: Secondary | ICD-10-CM

## 2016-07-06 NOTE — Therapy (Signed)
[place], [place], [address] Phone: [phone]   Fax:  [phone]  Physical Therapy Treatment  Patient Details  Name: Timothy Wilcox MRN: [id number] Date of Birth: 1941/10/03 Referring Provider: dr Asencion Partridge Mayo/Dr D Rolena Infante  Encounter Date: 07/06/2016      PT End of Session - 07/06/16 1018    Visit Number 8   Number of Visits 12   Date for PT Re-Evaluation 07/16/16   PT Start Time 5701   PT Stop Time 1107   PT Time Calculation (min) 52 min   Activity Tolerance Patient tolerated treatment well      Past Medical History:  Diagnosis Date  . Arthritis   . Gout   . Hyperlipemia   . IBS (irritable bowel syndrome)   . Renal disorder    kidney stones  . Seasonal allergies   . Sinus congestion    chronic  . Sleep apnea    uses a c-pap    Past Surgical History:  Procedure Laterality Date  . COLONOSCOPY    . INCISION AND DRAINAGE  2011   infected finger  . SHOULDER ARTHROSCOPY WITH ROTATOR CUFF REPAIR AND SUBACROMIAL DECOMPRESSION Right 01/29/2013   Procedure: RIGHT SHOULDER ARTHROSCOPY WITH SUBACROMIAL DECOMPRESSION, THREE TENDON ROTATOR CUFF REPAIR;  Surgeon: Cammie Sickle., MD;  Location: [facility];  Service: Orthopedics;  Laterality: Right;  . TOE DEBRIDEMENT     rt toe cyst    There were no vitals filed for this visit.      Subjective Assessment - 07/06/16 1019    Subjective Slowly getting better - ~50% improved. Still gets that ache across the LB and has difficluty with bridging. Does feel the stretch in the "right area". The segmental work feels good. has had a couple of days with no pain. Sitting seems to be the worst. Going on a two day car trip. Comes in for PT next week and then will be away for vacation for a couple of weeks.     Currently in Pain? Yes   Pain Score 1    Pain Location Back   Pain Orientation Right;Lower;Left   Pain Descriptors /  Indicators Aching;Tightness   Pain Type Chronic pain                         [facility] Adult PT Treatment/Exercise - 07/06/16 0001      Therapeutic Activites    Therapeutic Activities --  supine lying with sacrum on blue TB cushion for ~3-4 min      Neuro Re-ed    Neuro Re-ed Details  --  segmental mobility sitting ant/post pelvic tilt/lateralshift     Lumbar Exercises: Aerobic   Stationary Bike Nustep L5 x 6 min      Lumbar Exercises: Standing   Other Standing Lumbar Exercises standing with sacrum supported on disc for posterior pelvic tilt and hold 10-15 sec x 10 engaging core      Lumbar Exercises: Supine   Bridge 10 reps;2 seconds  engaging core - avoiding pain      Moist Heat Therapy   Number Minutes Moist Heat 20 Minutes   Moist Heat Location Lumbar Spine     Electrical Stimulation   Electrical Stimulation Location bilat lumbar; Rt L5/S1/sacral border   Electrical Stimulation Action IFC   Electrical Stimulation Parameters  to tolerance   Electrical Stimulation Goals Pain;Tone       working  on segmental mobility and neuromuscular re-education working on disc and ball - focus on ant/post pelvic tilt and lateral trunk motion. Working on control through the core. Also working on trunk stretch over large ball forward - unable to tolerate lateral trunk flexion on the ball due to tighenning of the downward side - better stretch prone over ball.           PT Education - 07/06/16 1105    Education provided Yes   Education Details segmental mobility on disc and large therapy ball    Person(s) Educated Patient   Methods Explanation;Demonstration;Tactile cues;Verbal cues   Comprehension Verbalized understanding;Returned demonstration;Verbal cues required             PT Long Term Goals - 07/06/16 1022      PT LONG TERM GOAL #1   Title Improve core strength and stability with patient tolerating 20-30 min of exercise without difficulty 07/16/16   Time  6   Period Weeks   Status Partially Met     PT LONG TERM GOAL #2   Title Improve tissue extensibility through the lumbar and hip musculature 07/16/16   Time 6   Period Weeks   Status Achieved     PT LONG TERM GOAL #3   Title Decrease Lt LE radicular pain by 75-100% 07/16/16   Time 6   Period Weeks   Status On-going     PT LONG TERM GOAL #4   Title Independent in HEP 07/16/16   Time 6   Period Weeks   Status On-going     PT LONG TERM GOAL #5   Title Imporove FOTO to </= 39% limitation 07/16/16   Time 6   Period Weeks   Status On-going               Plan - 07/06/16 1106    Clinical Impression Statement Improving segmental trunk mobilty. Gradually improving pain levels and muscular tightness and increasing core stability in holding lumbar spine in less lordotic positions. Progressing well toward stated goals of therapy.    Rehab Potential Good   PT Frequency 2x / week   PT Duration 6 weeks   PT Treatment/Interventions Patient/family education;ADLs/Self Care Home Management;Neuromuscular re-education;Cryotherapy;Electrical Stimulation;Iontophoresis 78m/ml Dexamethasone;Moist Heat;Traction;Ultrasound;Manual techniques;Dry needling;Therapeutic activities;Therapeutic exercise   PT Next Visit Plan core stabilization; stretching hip adductors/?flexors; manual work v TDN to Rt lumbar and hip musculature posteriorly; assess response to specific stretches for Rt lower lumbar/above SI. Add sitting work for segmental mobilty for lumbar spine. Will try to hold some of the more uncomfortable stretches for a couple of days to monoter the response and see if the aching soreness will subside. May be stretching too hard in tissues that are shortened.    Consulted and Agree with Plan of Care Patient      Patient will benefit from skilled therapeutic intervention in order to improve the following deficits and impairments:  Postural dysfunction, Improper body mechanics, Pain, Decreased range of  motion, Decreased mobility, Decreased strength, Decreased activity tolerance  Visit Diagnosis: Lumbago with sciatica, left side  Other symptoms and signs involving the musculoskeletal system     Problem List There are no active problems to display for this patient.   CDundee MPH  07/06/2016, 11:09 AM  [facility] [place] [address]Phone: [phone]  Fax:  [phone] Name: Timothy TESTERMRN: [id number] of Birth: [birth year]

## 2016-07-10 ENCOUNTER — Encounter: Payer: Self-pay | Admitting: Rehabilitative and Restorative Service Providers"

## 2016-07-10 ENCOUNTER — Ambulatory Visit (INDEPENDENT_AMBULATORY_CARE_PROVIDER_SITE_OTHER): Payer: Medicare Other | Admitting: Rehabilitative and Restorative Service Providers"

## 2016-07-10 DIAGNOSIS — G8929 Other chronic pain: Secondary | ICD-10-CM | POA: Diagnosis not present

## 2016-07-10 DIAGNOSIS — M5442 Lumbago with sciatica, left side: Secondary | ICD-10-CM | POA: Diagnosis not present

## 2016-07-10 DIAGNOSIS — R29898 Other symptoms and signs involving the musculoskeletal system: Secondary | ICD-10-CM

## 2016-07-10 NOTE — Therapy (Addendum)
[place], [place], [address] Phone: [phone]   Fax:  [phone]  Physical Therapy Treatment  Patient Details  Name: Timothy Wilcox MRN: [id number] Date of Birth: [birth year] Referring Provider: Dr Asencion Partridge mayo/Dr Rolena Infante   Encounter Date: 07/10/2016      PT End of Session - 07/10/16 0856    Visit Number 9   Number of Visits 12   Date for PT Re-Evaluation 07/16/16   PT Start Time 0845   PT Stop Time 0933   PT Time Calculation (min) 48 min   Activity Tolerance Patient tolerated treatment well      Past Medical History:  Diagnosis Date  . Arthritis   . Gout   . Hyperlipemia   . IBS (irritable bowel syndrome)   . Renal disorder    kidney stones  . Seasonal allergies   . Sinus congestion    chronic  . Sleep apnea    uses a c-pap    Past Surgical History:  Procedure Laterality Date  . COLONOSCOPY    . INCISION AND DRAINAGE  2011   infected finger  . SHOULDER ARTHROSCOPY WITH ROTATOR CUFF REPAIR AND SUBACROMIAL DECOMPRESSION Right 01/29/2013   Procedure: RIGHT SHOULDER ARTHROSCOPY WITH SUBACROMIAL DECOMPRESSION, THREE TENDON ROTATOR CUFF REPAIR;  Surgeon: Cammie Sickle., MD;  Location: [facility] [facility] [place];  Service: Orthopedics;  Laterality: Right;  . TOE DEBRIDEMENT     rt toe cyst    There were no vitals filed for this visit.      Subjective Assessment - 07/10/16 0857    Subjective Continues to improve. Less pain and more mobility. Exercises are helping. Using a ball at home and in his shop. Pleased with progress.    Currently in Pain? No/denies            [facility] PT Assessment - 07/10/16 0001      Assessment   Medical Diagnosis LBP Lt LE radicular pain    Referring Provider Dr Asencion Partridge mayo/Dr Rolena Infante    Onset Date/Surgical Date 01/07/16   Hand Dominance Right   Next MD Visit prn     Observation/Other Assessments   Focus on Therapeutic Outcomes (FOTO)  46%  limitation      AROM   Lumbar Flexion 75%   Lumbar Extension 55%   Lumbar - Right Side Bend 65%   Lumbar - Left Side Bend 65%   Lumbar - Right Rotation 55%   Lumbar - Left Rotation 55%     Strength   Overall Strength Comments 5/5 bilat LE's      Flexibility   Hamstrings 90 deg bilat    Quadriceps WFL's   ITB tight Lt/ Rt    Piriformis tight Lt/Rt      Palpation   Spinal mobility mild tenderness with CPA mobs L3/4/5    Palpation comment mild musculat tghtness noted through the Rt lumbar paraspinals; QL; hip abductors                      [facility] Adult PT Treatment/Exercise - 07/10/16 0001      Therapeutic Activites    Therapeutic Activities --  supine lying with sacrum on blue TB cushion for ~3-4 min      Neuro Re-ed    Neuro Re-ed Details  --  segmental mobility sitting ant/post pelvic tilt/lateralshift     Lumbar Exercises: Stretches   Passive Hamstring Stretch 3 reps;30 seconds  Lumbar Exercises: Aerobic   Stationary Bike Nustep L5 x 6 min      Lumbar Exercises: Standing   Other Standing Lumbar Exercises standing with sacrum supported on disc for posterior pelvic tilt and hold 10-15 sec x 10 engaging core      Lumbar Exercises: Seated   LAQ on Ball Limitations working on core stabilization on ball - see HEP      Lumbar Exercises: Supine   Bridge 10 reps;2 seconds  engaging core - avoiding pain      Moist Heat Therapy   Number Minutes Moist Heat 20 Minutes   Moist Heat Location Lumbar Spine     Electrical Stimulation   Electrical Stimulation Location bilat lumbar; Rt L5/S1/sacral border   Electrical Stimulation Action IFC   Electrical Stimulation Parameters to tolerance   Electrical Stimulation Goals Pain;Tone                PT Education - 07/10/16 0920    Education provided Yes   Education Details HEP    Person(s) Educated Patient   Methods Explanation;Demonstration;Tactile cues;Verbal cues;Handout   Comprehension Verbalized  understanding;Returned demonstration;Verbal cues required;Tactile cues required             PT Long Term Goals - 07/10/16 0931      PT LONG TERM GOAL #1   Title Improve core strength and stability with patient tolerating 20-30 min of exercise without difficulty 07/16/16   Time 6   Period Weeks   Status Achieved     PT LONG TERM GOAL #2   Title Improve tissue extensibility through the lumbar and hip musculature 07/16/16   Time 6   Period Weeks   Status Achieved     PT LONG TERM GOAL #3   Title Decrease Lt LE radicular pain by 75-100% 07/16/16   Time 6   Period Weeks   Status Partially Met     PT LONG TERM GOAL #4   Title Independent in HEP 07/16/16   Time 6   Period Weeks   Status Achieved     PT LONG TERM GOAL #5   Title Imporove FOTO to </= 39% limitation 07/16/16   Time 6   Period Weeks   Status Not Met             Patient will benefit from skilled therapeutic intervention in order to improve the following deficits and impairments:     Visit Diagnosis: Chronic right-sided low back pain with left-sided sciatica  Other symptoms and signs involving the musculoskeletal system     Problem List There are no active problems to display for this patient.   Markiyah Gahm Nilda Simmer PT, MPH  07/10/2016, 1:08 PM  [facility] [place], [place], [address] Phone: [phone]   Fax:  [phone]  Name: Timothy Wilcox MRN: [id number] Date of Birth: 1940/10/17  PHYSICAL THERAPY DISCHARGE SUMMARY  Visits from Start of Care: 9  Current functional level related to goals / functional outcomes: Excellent progress with rehab with good resolutioin of some of the chronic LBP pt has experienced for years.    Remaining deficits: Needs to continue with stretching and core stabilization   Education / Equipment: HEP Plan: Patient agrees to discharge.  Patient goals were met. Patient is being discharged  due to meeting the stated rehab goals.  ?????    Mayla Biddy P. Helene Kelp PT, MPH 09/04/16 10:33 AM

## 2016-07-10 NOTE — Patient Instructions (Addendum)
Upper Back Stretch     Lie prone over ball. Grasp elbows and reach toward the floor. Hold _30-60__ seconds. Do _3-4 repetitions.  One-Arm Row - Tubing    Sit with one arm reaching forward, other hand on abdomen. Pull tubing to side of chest, palm in or up. Keep hips still. Anchor at chest level, in front of moving arm. Repeat with other arm. Do __3_ sets of __10_ repetitions. Can also do shoulder extension and scapular abduction sitting on ball    Medium Squat    Stand with ball between back and wall. Perform a medium squat. Do _1-2__ sets of _10__ repetitions.   Can also work on tightening core and pushing the low back into the ball (pelvic tilt)    Lying on back ball between knees  Engage core! Bring knees up with ball grab ball with hands keeping knees up -  Bring ball up over head  Move slowly keeping core tight 5-10 reps    Sitting on firm surface  Gently and slowly lift one hip and then the other  5 each side  progress to more unstable surface - pillow then ball

## 2016-10-22 DIAGNOSIS — M7662 Achilles tendinitis, left leg: Secondary | ICD-10-CM | POA: Diagnosis not present

## 2016-10-30 ENCOUNTER — Ambulatory Visit (INDEPENDENT_AMBULATORY_CARE_PROVIDER_SITE_OTHER): Payer: Medicare HMO | Admitting: Rehabilitative and Restorative Service Providers"

## 2016-10-30 DIAGNOSIS — Z0100 Encounter for examination of eyes and vision without abnormal findings: Secondary | ICD-10-CM | POA: Diagnosis not present

## 2016-10-30 DIAGNOSIS — M25572 Pain in left ankle and joints of left foot: Secondary | ICD-10-CM

## 2016-10-30 DIAGNOSIS — R531 Weakness: Secondary | ICD-10-CM | POA: Diagnosis not present

## 2016-10-30 DIAGNOSIS — H524 Presbyopia: Secondary | ICD-10-CM | POA: Diagnosis not present

## 2016-10-30 DIAGNOSIS — H25099 Other age-related incipient cataract, unspecified eye: Secondary | ICD-10-CM | POA: Diagnosis not present

## 2016-10-30 DIAGNOSIS — R29898 Other symptoms and signs involving the musculoskeletal system: Secondary | ICD-10-CM

## 2016-10-30 DIAGNOSIS — G8929 Other chronic pain: Secondary | ICD-10-CM | POA: Diagnosis not present

## 2016-10-30 NOTE — Therapy (Signed)
[place], [place], [address] Phone: [phone]   Fax:  [phone]  Physical Therapy Evaluation  Patient Details  Name: Timothy Wilcox MRN: [id number] Date of Birth: [birth year] Referring Provider: Mechele Claude, PA-C  Encounter Date: 10/30/2016      PT End of Session - 10/30/16 0845    Visit Number 1   Number of Visits 6   Date for PT Re-Evaluation 12/11/16   PT Start Time 0845   PT Stop Time 0940   PT Time Calculation (min) 55 min   Activity Tolerance Patient tolerated treatment well      Past Medical History:  Diagnosis Date  . Arthritis   . Gout   . Hyperlipemia   . IBS (irritable bowel syndrome)   . Renal disorder    kidney stones  . Seasonal allergies   . Sinus congestion    chronic  . Sleep apnea    uses a c-pap    Past Surgical History:  Procedure Laterality Date  . COLONOSCOPY    . INCISION AND DRAINAGE  2011   infected finger  . SHOULDER ARTHROSCOPY WITH ROTATOR CUFF REPAIR AND SUBACROMIAL DECOMPRESSION Right 01/29/2013   Procedure: RIGHT SHOULDER ARTHROSCOPY WITH SUBACROMIAL DECOMPRESSION, THREE TENDON ROTATOR CUFF REPAIR;  Surgeon: Cammie Sickle., MD;  Location: [place];  Service: Orthopedics;  Laterality: Right;  . TOE DEBRIDEMENT     rt toe cyst    There were no vitals filed for this visit.       Subjective Assessment - 10/30/16 0851    Subjective Patient reports that he twisted his Lt ankle 6-7 months ago. He has had continued to have symptoms and would like to learn exercises for home. He has pain with certain movements; prolonged standing or walking; has tightness with initially getting up to walk.    Pertinent History LBP; RCR; see medical record    How long can you sit comfortably? no limit   How long can you stand comfortably? 10 min    How long can you walk comfortably? 2-3 min    Patient Stated Goals learn some exercises to do  to get rid of pain in the ankle/achilles tendon area    Currently in Pain? Yes   Pain Score 1    Pain Location Ankle   Pain Orientation Left   Pain Descriptors / Indicators Aching   Pain Type Chronic pain   Pain Onset More than a month ago   Pain Frequency Intermittent   Aggravating Factors  prolonged standing or walking; hitting back of ankle on surface   Pain Relieving Factors ice; stretching (mild)             [facility] PT Assessment - 10/30/16 0001      Assessment   Medical Diagnosis Lt Achilles tendinitis   Referring Provider Mechele Claude, PA-C   Onset Date/Surgical Date 03/22/16   Hand Dominance Right   Next MD Visit PRN   Prior Therapy none for ankle      Precautions   Precautions None     Balance Screen   Has the patient fallen in the past 6 months No   Has the patient had a decrease in activity level because of a fear of falling?  No   Is the patient reluctant to leave their home because of a fear of falling?  No     Home Environment   Additional Comments multilevel home - some  trouble with steps      Prior Function   Level of Independence Independent   Vocation Retired   Agricultural engineer; Barrister's clerk; household chores; yard work      Observation/Other Assessments   Focus on Therapeutic Outcomes ([place])  49% limitation      Sensation   Additional Comments WNL's per pt report      Posture/Postural Control   Posture Comments some head forward posture; slightly flexed forward at hips; Lt LE in slight ER in standing      AROM   Right/Left Hip --  WFL's   Right/Left Knee --  WFL's   Right/Left Ankle --  WFL's     Strength   Right/Left Hip --  5/5 bilat   Right/Left Knee --  5/5 bilat    Right/Left Ankle --  Rt ankle 5/5    Left Ankle Dorsiflexion 5/5   Left Ankle Plantar Flexion --  5-/5 painful   Left Ankle Inversion 5/5   Left Ankle Eversion --  5-/5 painful     Flexibility   Hamstrings tight bilat ~ 75-80 deg    ITB tight Lt > Rt       Palpation   Palpation comment significant tightness through the lateral to mid calf with muscle banding and trigger points noted; tender and tight at insertion of achilles tendon at lat/med calcaneous      Functional Gait  Assessment   Gait assessed  --  gait - no noticable limp; LE's in ER                    [facility] Adult PT Treatment/Exercise - 10/30/16 0001      Self-Care   Self-Care --  education re ankle/foot position for biking/stair climbing     Therapeutic Activites    Therapeutic Activities --  myofacial work using the stick     Iontophoresis   Type of Iontophoresis Dexamethasone   Location Lt Achilles tendon   Dose 1.3 cc   Time 8-10 hr/40 mAmp     Manual Therapy   Manual therapy comments pt prone   Soft tissue mobilization deep tissue work through [employer] calf      Ankle Exercises: Stretches   Soleus Stretch 3 reps;30 seconds   Gastroc Stretch 3 reps;30 seconds   Other Stretch hamstring with DF 30 sec x 3 with strap - supine      Ankle Exercises: Supine   T-Band DF/INV/EVER/PF red TB 10 x 2 sets                 PT Education - 10/30/16 0937    Education provided Yes   Education Details HEP ionto   Person(s) Educated Patient   Methods Explanation;Demonstration;Tactile cues;Verbal cues;Handout   Comprehension Verbalized understanding;Returned demonstration;Verbal cues required;Tactile cues required             PT Long Term Goals - 10/30/16 1042      PT LONG TERM GOAL #1   Title Improve posture and alignment in standing 12/11/16   Time 6   Period Weeks   Status New     PT LONG TERM GOAL #2   Title Improve tissue extensibility through Lt calf 12/11/16   Time 6   Period Weeks   Status New     PT LONG TERM GOAL #3   Title Patient reports that he can walk 20-30 min without pain or discomfort 12/11/16   Time 6   Period Weeks   Status  New     PT LONG TERM GOAL #4   Title Independent in HEP 12/11/16   Time 6   Period Weeks   Status New      PT LONG TERM GOAL #5   Title Imporove FOTO to </= 36% limitation 12/11/16   Time 6   Period Weeks   Status New               Plan - 10/30/16 1039    Clinical Impression Statement Timothy Wilcox presents with Lt Achille's tendinitis which has been present for the past 6-7 months. he has pain on an intermittent basis; pain and weakness with resistive testing; tightness and pain with palpation through the Lt calf; limited functional activitiy level. He will benefit from Physical Therapy to address problems identified.    Rehab Potential Good   PT Frequency 1x / week   PT Duration 6 weeks   PT Treatment/Interventions Patient/family education;ADLs/Self Care Home Management;Cryotherapy;Electrical Stimulation;Iontophoresis 4mg /ml Dexamethasone;Moist Heat;Ultrasound;Dry needling;Manual techniques;Therapeutic activities;Therapeutic exercise   PT Next Visit Plan [place] and deep tissue work through [place] calf; stretching; strengtheing; continued education; ionto if indicated; modalities as indicated    Consulted and Agree with Plan of Care Patient      Patient will benefit from skilled therapeutic intervention in order to improve the following deficits and impairments:  Postural dysfunction, Improper body mechanics, Pain, Increased fascial restricitons, Increased muscle spasms, Decreased strength, Decreased mobility, Decreased endurance, Decreased activity tolerance  Visit Diagnosis: Chronic pain of left ankle - Plan: PT plan of care cert/re-cert  Other symptoms and signs involving the musculoskeletal system - Plan: PT plan of care cert/re-cert  Weakness generalized - Plan: PT plan of care cert/re-cert     Problem List There are no active problems to display for this patient.   [place], MPH  10/30/2016, 10:48 AM  [facility] [place], [place], [address] Phone: [phone]   Fax:  [phone]  Name: Timothy Wilcox MRN: [id number] Date of Birth: 10-08-1941

## 2016-10-30 NOTE — Patient Instructions (Addendum)
Achilles / Soleus, Standing    Stand, right foot behind, heel on floor and turned slightly out. Lower hips and bend knees. Hold 30___ seconds. Repeat _3__ times per session. Do _3-4__ sessions per day.   Gastroc Stretch    Stand with right foot back, leg straight, forward leg bent. Keeping heel on floor, turned slightly out, lean into wall until stretch is felt in calf. Hold __30__ seconds. Repeat __3__ times per set.  Do _3-4___ sessions per day.   Hamstring Step 1    Straighten left knee. Drop toes toward nose.pull leg up to feel stretch in hamstring and calf. Hold __30-60_ seconds.  Repeat _3__ times.    Dorsiflexion: Resisted    Facing anchor, tubing around left foot, pull toward face.  Repeat ____ times per set. Do ____ sets per session. Do ____ sessions per day.  http://orth.exer.us/8   Copyright  VHI. All rights reserved.  Plantar Flexion: Resisted    Anchor behind, tubing around left foot, press down. Repeat ____ times per set. Do ____ sets per session. Do ____ sessions per day.  http://orth.exer.us/10   Copyright  VHI. All rights reserved.  Inversion: Resisted    Cross legs with right leg underneath, foot in tubing loop. Hold tubing around other foot to resist and turn foot in. Repeat ____ times per set. Do ____ sets per session. Do ____ sessions per day.  http://orth.exer.us/12   Copyright  VHI. All rights reserved.  Eversion: Resisted    With right foot in tubing loop, hold tubing around other foot to resist and turn foot out. Repeat ____ times per set. Do ____ sets per session. Do ____ sessions per day.  http://orth.exer.us/14   Copyright  VHI. All rights reserved.

## 2016-11-06 ENCOUNTER — Encounter: Payer: Self-pay | Admitting: Rehabilitative and Restorative Service Providers"

## 2016-11-08 NOTE — Therapy (Addendum)
PHYSICAL THERAPY DISCHARGE SUMMARY  Visits from Start of Care: evaluation only  Current functional level related to goals / functional outcomes: Unchanged - instructed in appropriate exercise program and use of ice/heat for home. Patient cancelled additional follow up appointment stating that he was doing better and felt confident in continuing independent HEP    Remaining deficits: unchanged   Education / Equipment: HEP Plan: Patient agrees to discharge.  Patient goals were partially met. Patient is being discharged due to being pleased with the current functional level.  ?????     Tae Vonada P. Helene Kelp PT, MPH 11/16/16 10:12 AM

## 2017-01-18 DIAGNOSIS — G4733 Obstructive sleep apnea (adult) (pediatric): Secondary | ICD-10-CM | POA: Diagnosis not present

## 2017-04-03 ENCOUNTER — Encounter (HOSPITAL_COMMUNITY)
Admission: EM | Disposition: A | Discharge status: Home / Self Care | Payer: Self-pay | Source: Home / Self Care | Attending: Cardiovascular Disease

## 2017-04-03 ENCOUNTER — Inpatient Hospital Stay (HOSPITAL_COMMUNITY)
Admission: EM | Admit: 2017-04-03 | Discharge: 2017-04-05 | DRG: 247 | Disposition: A | Discharge status: Home / Self Care | Payer: Medicare HMO | Attending: Cardiovascular Disease | Admitting: Cardiovascular Disease

## 2017-04-03 ENCOUNTER — Encounter (HOSPITAL_COMMUNITY): Payer: Self-pay | Admitting: Cardiovascular Disease

## 2017-04-03 DIAGNOSIS — E669 Obesity, unspecified: Secondary | ICD-10-CM | POA: Diagnosis not present

## 2017-04-03 DIAGNOSIS — Z7141 Alcohol abuse counseling and surveillance of alcoholic: Secondary | ICD-10-CM

## 2017-04-03 DIAGNOSIS — I2119 ST elevation (STEMI) myocardial infarction involving other coronary artery of inferior wall: Secondary | ICD-10-CM | POA: Diagnosis present

## 2017-04-03 DIAGNOSIS — E785 Hyperlipidemia, unspecified: Secondary | ICD-10-CM | POA: Diagnosis present

## 2017-04-03 DIAGNOSIS — Z791 Long term (current) use of non-steroidal anti-inflammatories (NSAID): Secondary | ICD-10-CM

## 2017-04-03 DIAGNOSIS — I441 Atrioventricular block, second degree: Secondary | ICD-10-CM | POA: Diagnosis not present

## 2017-04-03 DIAGNOSIS — K589 Irritable bowel syndrome without diarrhea: Secondary | ICD-10-CM | POA: Diagnosis present

## 2017-04-03 DIAGNOSIS — G4730 Sleep apnea, unspecified: Secondary | ICD-10-CM | POA: Diagnosis present

## 2017-04-03 DIAGNOSIS — Z888 Allergy status to other drugs, medicaments and biological substances status: Secondary | ICD-10-CM

## 2017-04-03 DIAGNOSIS — J328 Other chronic sinusitis: Secondary | ICD-10-CM | POA: Diagnosis present

## 2017-04-03 DIAGNOSIS — Z79891 Long term (current) use of opiate analgesic: Secondary | ICD-10-CM

## 2017-04-03 DIAGNOSIS — Z6835 Body mass index (BMI) 35.0-35.9, adult: Secondary | ICD-10-CM | POA: Diagnosis not present

## 2017-04-03 DIAGNOSIS — R0789 Other chest pain: Secondary | ICD-10-CM | POA: Diagnosis present

## 2017-04-03 DIAGNOSIS — I252 Old myocardial infarction: Secondary | ICD-10-CM | POA: Diagnosis present

## 2017-04-03 DIAGNOSIS — M109 Gout, unspecified: Secondary | ICD-10-CM | POA: Diagnosis not present

## 2017-04-03 DIAGNOSIS — I2111 ST elevation (STEMI) myocardial infarction involving right coronary artery: Secondary | ICD-10-CM | POA: Diagnosis not present

## 2017-04-03 DIAGNOSIS — R001 Bradycardia, unspecified: Secondary | ICD-10-CM | POA: Diagnosis present

## 2017-04-03 DIAGNOSIS — Z955 Presence of coronary angioplasty implant and graft: Secondary | ICD-10-CM

## 2017-04-03 DIAGNOSIS — I2510 Atherosclerotic heart disease of native coronary artery without angina pectoris: Secondary | ICD-10-CM | POA: Diagnosis not present

## 2017-04-03 DIAGNOSIS — Z79899 Other long term (current) drug therapy: Secondary | ICD-10-CM

## 2017-04-03 DIAGNOSIS — Z87442 Personal history of urinary calculi: Secondary | ICD-10-CM | POA: Diagnosis not present

## 2017-04-03 DIAGNOSIS — R079 Chest pain, unspecified: Secondary | ICD-10-CM | POA: Diagnosis not present

## 2017-04-03 HISTORY — DX: Old myocardial infarction: I25.2

## 2017-04-03 HISTORY — PX: CORONARY STENT INTERVENTION: CATH118234

## 2017-04-03 HISTORY — PX: LEFT HEART CATH AND CORONARY ANGIOGRAPHY: CATH118249

## 2017-04-03 LAB — LIPID PANEL
Cholesterol: 192 mg/dL (ref 0–200)
HDL: 46 mg/dL (ref >40)
LDL CALC: 89 mg/dL (ref 0–99)
TRIGLYCERIDES: 287 mg/dL — AB (ref <150)
Total CHOL/HDL Ratio: 4.2 RATIO
VLDL: 57 mg/dL — AB (ref 0–40)

## 2017-04-03 LAB — COMPREHENSIVE METABOLIC PANEL
ALK PHOS: 53 U/L (ref 38–126)
ALT: 38 U/L (ref 17–63)
ANION GAP: 13 (ref 5–15)
AST: 33 U/L (ref 15–41)
Albumin: 3.6 g/dL (ref 3.5–5.0)
BUN: 15 mg/dL (ref 6–20)
CALCIUM: 9 mg/dL (ref 8.9–10.3)
CHLORIDE: 109 mmol/L (ref 101–111)
CO2: 17 mmol/L — AB (ref 22–32)
CREATININE: 1.02 mg/dL (ref 0.61–1.24)
GFR calc Af Amer: >60 mL/min (ref >60)
GFR calc non Af Amer: >60 mL/min (ref >60)
Glucose, Bld: 153 mg/dL — ABNORMAL HIGH (ref 65–99)
Potassium: 3.7 mmol/L (ref 3.5–5.1)
SODIUM: 139 mmol/L (ref 135–145)
Total Bilirubin: 0.9 mg/dL (ref 0.3–1.2)
Total Protein: 6.4 g/dL — ABNORMAL LOW (ref 6.5–8.1)

## 2017-04-03 LAB — POCT I-STAT, CHEM 8
BUN: 16 mg/dL (ref 6–20)
CALCIUM ION: 1.16 mmol/L (ref 1.15–1.40)
CREATININE: 0.9 mg/dL (ref 0.61–1.24)
Chloride: 109 mmol/L (ref 101–111)
GLUCOSE: 155 mg/dL — AB (ref 65–99)
HEMATOCRIT: 42 % (ref 39.0–52.0)
HEMOGLOBIN: 14.3 g/dL (ref 13.0–17.0)
Potassium: 3.7 mmol/L (ref 3.5–5.1)
Sodium: 141 mmol/L (ref 135–145)
TCO2: 19 mmol/L (ref 0–100)

## 2017-04-03 LAB — APTT

## 2017-04-03 LAB — CBC
HEMATOCRIT: 41.6 % (ref 39.0–52.0)
Hemoglobin: 14 g/dL (ref 13.0–17.0)
MCH: 31.7 pg (ref 26.0–34.0)
MCHC: 33.7 g/dL (ref 30.0–36.0)
MCV: 94.1 fL (ref 78.0–100.0)
Platelets: 188 10*3/uL (ref 150–400)
RBC: 4.42 MIL/uL (ref 4.22–5.81)
RDW: 13.6 % (ref 11.5–15.5)
WBC: 4.2 10*3/uL (ref 4.0–10.5)

## 2017-04-03 LAB — TROPONIN I
TROPONIN I: 21.36 ng/mL — AB (ref <0.03)
Troponin I: 0.17 ng/mL (ref <0.03)

## 2017-04-03 LAB — POCT ACTIVATED CLOTTING TIME
ACTIVATED CLOTTING TIME: 230 s
Activated Clotting Time: 301 seconds

## 2017-04-03 LAB — MRSA PCR SCREENING: MRSA BY PCR: NEGATIVE

## 2017-04-03 LAB — PROTIME-INR
INR: 1.12
PROTHROMBIN TIME: 14.5 s (ref 11.4–15.2)

## 2017-04-03 SURGERY — LEFT HEART CATH AND CORONARY ANGIOGRAPHY
Anesthesia: LOCAL

## 2017-04-03 MED ORDER — ASPIRIN EC 81 MG PO TBEC
81.0000 mg | DELAYED_RELEASE_TABLET | Freq: Every day | ORAL | Status: DC
  Administered 2017-04-04 – 2017-04-05 (×2): 81 mg via ORAL
  Filled 2017-04-03 (×3): qty 1

## 2017-04-03 MED ORDER — TICAGRELOR 90 MG PO TABS
ORAL_TABLET | ORAL | Status: DC | PRN
  Administered 2017-04-03: 180 mg via ORAL

## 2017-04-03 MED ORDER — NITROGLYCERIN 0.4 MG SL SUBL: 0.4000 mg | SUBLINGUAL_TABLET | SUBLINGUAL | Status: DC | PRN

## 2017-04-03 MED ORDER — FENTANYL CITRATE (PF) 100 MCG/2ML IJ SOLN
INTRAMUSCULAR | Status: AC
  Filled 2017-04-03: qty 2

## 2017-04-03 MED ORDER — LIDOCAINE HCL (PF) 1 % IJ SOLN
INTRAMUSCULAR | Status: AC
  Filled 2017-04-03: qty 30

## 2017-04-03 MED ORDER — FENTANYL CITRATE (PF) 100 MCG/2ML IJ SOLN
INTRAMUSCULAR | Status: DC | PRN
  Administered 2017-04-03: 25 ug via INTRAVENOUS

## 2017-04-03 MED ORDER — LIDOCAINE HCL (PF) 1 % IJ SOLN
INTRAMUSCULAR | Status: DC | PRN
  Administered 2017-04-03: 2 mL

## 2017-04-03 MED ORDER — IOPAMIDOL (ISOVUE-370) INJECTION 76%
INTRAVENOUS | Status: DC | PRN
  Administered 2017-04-03: 145 mL via INTRA_ARTERIAL

## 2017-04-03 MED ORDER — VERAPAMIL HCL 2.5 MG/ML IV SOLN
INTRAVENOUS | Status: DC | PRN
  Administered 2017-04-03: 10 mL via INTRA_ARTERIAL

## 2017-04-03 MED ORDER — TIROFIBAN HCL IN NACL 5-0.9 MG/100ML-% IV SOLN
0.1500 ug/kg/min | INTRAVENOUS | Status: AC
  Administered 2017-04-03: 0.15 ug/kg/min via INTRAVENOUS
  Filled 2017-04-03: qty 100

## 2017-04-03 MED ORDER — TIROFIBAN HCL IN NACL 5-0.9 MG/100ML-% IV SOLN
INTRAVENOUS | Status: AC
  Filled 2017-04-03: qty 100

## 2017-04-03 MED ORDER — METOPROLOL TARTRATE 25 MG PO TABS
25.0000 mg | ORAL_TABLET | Freq: Two times a day (BID) | ORAL | Status: DC
  Administered 2017-04-03 – 2017-04-05 (×5): 25 mg via ORAL
  Filled 2017-04-03 (×6): qty 1

## 2017-04-03 MED ORDER — SODIUM CHLORIDE 0.9% FLUSH: 3.0000 mL | INTRAVENOUS | Status: DC | PRN

## 2017-04-03 MED ORDER — OXYCODONE-ACETAMINOPHEN 5-325 MG PO TABS: 1.0000 | ORAL_TABLET | ORAL | Status: DC | PRN

## 2017-04-03 MED ORDER — HYDRALAZINE HCL 20 MG/ML IJ SOLN: 5.0000 mg | INTRAMUSCULAR | Status: AC | PRN

## 2017-04-03 MED ORDER — NITROGLYCERIN 1 MG/10 ML FOR IR/CATH LAB
INTRA_ARTERIAL | Status: DC | PRN
  Administered 2017-04-03: 150 ug via INTRACORONARY
  Administered 2017-04-03: 100 ug via INTRACORONARY

## 2017-04-03 MED ORDER — MIDAZOLAM HCL 2 MG/2ML IJ SOLN
INTRAMUSCULAR | Status: AC
  Filled 2017-04-03: qty 2

## 2017-04-03 MED ORDER — HEPARIN SODIUM (PORCINE) 1000 UNIT/ML IJ SOLN
INTRAMUSCULAR | Status: AC
  Filled 2017-04-03: qty 1

## 2017-04-03 MED ORDER — IOPAMIDOL (ISOVUE-370) INJECTION 76%
INTRAVENOUS | Status: AC
  Filled 2017-04-03: qty 125

## 2017-04-03 MED ORDER — ALLOPURINOL 100 MG PO TABS
100.0000 mg | ORAL_TABLET | Freq: Every day | ORAL | Status: DC
  Administered 2017-04-04 – 2017-04-05 (×2): 100 mg via ORAL
  Filled 2017-04-03 (×5): qty 1

## 2017-04-03 MED ORDER — ACETAMINOPHEN 325 MG PO TABS
650.0000 mg | ORAL_TABLET | ORAL | Status: DC | PRN
  Administered 2017-04-04: 650 mg via ORAL
  Filled 2017-04-03: qty 2

## 2017-04-03 MED ORDER — LABETALOL HCL 5 MG/ML IV SOLN: 10.0000 mg | INTRAVENOUS | Status: AC | PRN

## 2017-04-03 MED ORDER — TICAGRELOR 90 MG PO TABS
ORAL_TABLET | ORAL | Status: AC
  Filled 2017-04-03: qty 1

## 2017-04-03 MED ORDER — VERAPAMIL HCL 2.5 MG/ML IV SOLN
INTRAVENOUS | Status: AC
  Filled 2017-04-03: qty 2

## 2017-04-03 MED ORDER — TICAGRELOR 90 MG PO TABS
90.0000 mg | ORAL_TABLET | Freq: Two times a day (BID) | ORAL | Status: DC
  Administered 2017-04-03 – 2017-04-05 (×4): 90 mg via ORAL
  Filled 2017-04-03 (×4): qty 1

## 2017-04-03 MED ORDER — HEPARIN SODIUM (PORCINE) 1000 UNIT/ML IJ SOLN
INTRAMUSCULAR | Status: DC | PRN
  Administered 2017-04-03 (×2): 4000 [IU] via INTRAVENOUS

## 2017-04-03 MED ORDER — HEPARIN (PORCINE) IN NACL 2-0.9 UNIT/ML-% IJ SOLN
INTRAMUSCULAR | Status: AC | PRN
  Administered 2017-04-03: 1000 mL

## 2017-04-03 MED ORDER — SODIUM CHLORIDE 0.9% FLUSH
3.0000 mL | Freq: Two times a day (BID) | INTRAVENOUS | Status: DC
  Administered 2017-04-03 – 2017-04-04 (×3): 3 mL via INTRAVENOUS

## 2017-04-03 MED ORDER — SODIUM CHLORIDE 0.9 % IV SOLN
INTRAVENOUS | Status: AC | PRN
  Administered 2017-04-03: 250 mL/h via INTRAVENOUS

## 2017-04-03 MED ORDER — TIROFIBAN HCL IN NACL 5-0.9 MG/100ML-% IV SOLN: 0.1500 ug/kg/min | INTRAVENOUS | Status: DC

## 2017-04-03 MED ORDER — NITROGLYCERIN 1 MG/10 ML FOR IR/CATH LAB
INTRA_ARTERIAL | Status: AC
  Filled 2017-04-03: qty 10

## 2017-04-03 MED ORDER — TIROFIBAN HCL IN NACL 5-0.9 MG/100ML-% IV SOLN
INTRAVENOUS | Status: AC | PRN
  Administered 2017-04-03: 0.15 ug/kg/min via INTRAVENOUS

## 2017-04-03 MED ORDER — SODIUM CHLORIDE 0.9 % IV SOLN: 250.0000 mL | INTRAVENOUS | Status: DC | PRN

## 2017-04-03 MED ORDER — TIROFIBAN (AGGRASTAT) BOLUS VIA INFUSION
INTRAVENOUS | Status: DC | PRN
  Administered 2017-04-03: 2425 ug via INTRAVENOUS

## 2017-04-03 MED ORDER — HEPARIN (PORCINE) IN NACL 2-0.9 UNIT/ML-% IJ SOLN
INTRAMUSCULAR | Status: AC
  Filled 2017-04-03: qty 1000

## 2017-04-03 MED ORDER — ENOXAPARIN SODIUM 40 MG/0.4ML ~~LOC~~ SOLN
40.0000 mg | SUBCUTANEOUS | Status: DC
  Administered 2017-04-04: 40 mg via SUBCUTANEOUS
  Filled 2017-04-03 (×3): qty 0.4

## 2017-04-03 MED ORDER — ONDANSETRON HCL 4 MG/2ML IJ SOLN: 4.0000 mg | Freq: Four times a day (QID) | INTRAMUSCULAR | Status: DC | PRN

## 2017-04-03 MED ORDER — MIDAZOLAM HCL 2 MG/2ML IJ SOLN
INTRAMUSCULAR | Status: DC | PRN
  Administered 2017-04-03: 1 mg via INTRAVENOUS

## 2017-04-03 MED ORDER — SODIUM CHLORIDE 0.9 % WEIGHT BASED INFUSION
1.0000 mL/kg/h | INTRAVENOUS | Status: AC
  Administered 2017-04-03: 1 mL/kg/h via INTRAVENOUS

## 2017-04-03 SURGICAL SUPPLY — 19 items
BALLN SAPPHIRE 2.0X12 (BALLOONS) ×2
BALLN SAPPHIRE ~~LOC~~ 3.0X10 (BALLOONS) ×1 IMPLANT
BALLOON SAPPHIRE 2.0X12 (BALLOONS) IMPLANT
CATH 5FR JL3.5 JR4 ANG PIG MP (CATHETERS) ×1 IMPLANT
CATH VISTA GUIDE 6FR JR4 (CATHETERS) ×1 IMPLANT
DEVICE RAD COMP TR BAND LRG (VASCULAR PRODUCTS) ×1 IMPLANT
ELECT DEFIB PAD ADLT CADENCE (PAD) ×1 IMPLANT
GLIDESHEATH SLEND SS 6F .021 (SHEATH) ×1 IMPLANT
GUIDEWIRE INQWIRE 1.5J.035X260 (WIRE) IMPLANT
INQWIRE 1.5J .035X260CM (WIRE) ×2
KIT ENCORE 26 ADVANTAGE (KITS) ×1 IMPLANT
KIT HEART LEFT (KITS) ×2 IMPLANT
PACK CARDIAC CATHETERIZATION (CUSTOM PROCEDURE TRAY) ×2 IMPLANT
STENT PROMUS PREM MR 2.75X16 (Permanent Stent) ×1 IMPLANT
SYR MEDRAD MARK V 150ML (SYRINGE) ×2 IMPLANT
TRANSDUCER W/STOPCOCK (MISCELLANEOUS) ×2 IMPLANT
TUBING CIL FLEX 10 FLL-RA (TUBING) ×2 IMPLANT
WIRE COUGAR XT STRL 190CM (WIRE) ×1 IMPLANT
WIRE HI TORQ WHISPER MS 190CM (WIRE) ×1 IMPLANT

## 2017-04-03 NOTE — Plan of Care (Signed)
Problem: Activity: Goal: Ability to return to baseline activity level will improve Outcome: Progressing Pt is dangling without difficulty.  Problem: Nutrition: Goal: Adequate nutrition will be maintained Outcome: Progressing Pt ate 80% of his dinner tray.

## 2017-04-03 NOTE — H&P (Signed)
Cardiology Admission History and Physical:   Patient ID: Timothy Wilcox; [id number]; 1941-04-27   Admission date: 04/03/2017  Primary Care Provider: Hulan Fess, MD Primary Cardiologist: Unknown  Patient Profile:   Timothy Wilcox is a 76 y.o. male with a history of Hyperlipidemia presents with acute STEMI  History of Present Illness:   Timothy Wilcox is a 76 year old gentleman who developed severe substernal chest discomfort approximately one hour prior to admission. He had a similar episode 3 days ago but this morning's episode is more severe. He describes the pain as pressure and burning. The pain is nonradiating. There is associated weakness and dizziness. There is also associated nausea with no vomiting. He was short of breath with this but his breathing is now improved. EMS was called and an EKG in the field demonstrates an acute inferoposterior STEMI. A code STEMI has activated and the patient is brought directly to the cardiac catheterization lab. On arrival he remains uncomfortable with chest pain. He has no other acute complaints.   Past Medical History:  Diagnosis Date  . Acute inferoposterior myocardial infarction ([place]) 04/03/2017  . Arthritis   . Gout   . Hyperlipemia   . IBS (irritable bowel syndrome)   . Renal disorder    kidney stones  . Seasonal allergies   . Sinus congestion    chronic  . Sleep apnea    uses a c-pap    Past Surgical History:  Procedure Laterality Date  . COLONOSCOPY    . INCISION AND DRAINAGE  2011   infected finger  . SHOULDER ARTHROSCOPY WITH ROTATOR CUFF REPAIR AND SUBACROMIAL DECOMPRESSION Right 01/29/2013   Procedure: RIGHT SHOULDER ARTHROSCOPY WITH SUBACROMIAL DECOMPRESSION, THREE TENDON ROTATOR CUFF REPAIR;  Surgeon: Cammie Sickle., MD;  Location: [facility];  Service: Orthopedics;  Laterality: Right;  . TOE DEBRIDEMENT     rt toe cyst     Medications Prior to Admission: Prior to Admission medications     Medication Sig Start Date End Date Taking? Authorizing Provider  allopurinol (ZYLOPRIM) 100 MG tablet Take 100 mg by mouth daily.    [provider]  cephALEXin (KEFLEX) 500 MG capsule Take 1 capsule (500 mg total) by mouth 3 (three) times daily. 01/29/13   Dasnoit, Herbie [place], PA-C  HYDROmorphone (DILAUDID) 2 MG tablet 1 or 2 tabs every 4 hours as needed for pain 01/29/13   Dasnoit, Robert, PA-C  ibuprofen (ADVIL,MOTRIN) 200 MG tablet Take 400 mg by mouth every 6 (six) hours as needed. pain    [provider]  loratadine (CLARITIN) 10 MG tablet Take 10 mg by mouth daily.    [provider]  Multiple Vitamin (MULTIVITAMIN WITH MINERALS) TABS Take 1 tablet by mouth daily.    [provider]  omega-3 acid ethyl esters (LOVAZA) 1 G capsule Take 1-2 g by mouth See admin instructions. Takes 1 in the morning and 2 at night    [provider]  potassium citrate (UROCIT-K) 10 MEQ (1080 MG) SR tablet Take 10 mEq by mouth 3 (three) times daily with meals.    [provider]  sildenafil (VIAGRA) 100 MG tablet Take 100 mg by mouth daily as needed. ED    [provider]  tamsulosin (FLOMAX) 0.4 MG CAPS Take 0.4 mg by mouth as needed (used if her has a kidney stone). 03/24/12   Hyman Bible, PA-C     Allergies:    Allergies  Allergen Reactions  . Statins  Pain in joints    Social History:   Social History   Social History  . Marital status: Married    Spouse name: N/A  . Number of children: N/A  . Years of education: N/A   Occupational History  . Not on file.   Social History Main Topics  . Smoking status: Never Smoker  . Smokeless tobacco: Not on file  . Alcohol use Yes     Comment: occ  . Drug use: No  . Sexual activity: Not on file   Other Topics Concern  . Not on file   Social History Narrative  . No narrative on file    Family History:   The patient's Family history is negative for premature coronary artery disease  in all first-degree relatives.  ROS:  Please see the history of present illness.  All other ROS reviewed and negative.     Physical Exam/Data:  Heart rate is 42. Blood pressure is 124/82. Respiratory rate is 20 No intake or output data in the 24 hours ending 04/03/17 1124  There is no height or weight on file to calculate BMI.  General:  Obese male, moderate distress secondary to chest pain HEENT: normal Lymph: no adenopathy Neck: no JVD Endocrine:  No thryomegaly Vascular: No carotid bruits; FA pulses 2+ bilaterally without bruits  Cardiac:  normal S1, S2; bradycardic and regular; no murmur. Distant heart sounds Lungs:  clear to auscultation bilaterally, no wheezing, rhonchi or rales  Abd: soft, nontender, no hepatomegaly  Ext: no edema Musculoskeletal:  No deformities, BUE and BLE strength normal and equal Skin: Pale, diaphoretic  Neuro:  CNs 2-12 intact, no focal abnormalities noted Psych:  Normal affect   EKG:  The ECG that was done was personally reviewed and demonstrates 2-1 AV block with acute inferoposterior injury  Relevant CV Studies: Pending  Laboratory Data:  ChemistryNo results for input(s): NA, K, CL, CO2, GLUCOSE, BUN, CREATININE, CALCIUM, GFRNONAA, GFRAA, ANIONGAP in the last 168 hours.  No results for input(s): PROT, ALBUMIN, AST, ALT, ALKPHOS, BILITOT in the last 168 hours. Hematology  Recent Labs Lab 04/03/17 1039  WBC 4.2  RBC 4.42  HGB 14.0  HCT 41.6  MCV 94.1  MCH 31.7  MCHC 33.7  RDW 13.6  PLT 188   Cardiac EnzymesNo results for input(s): TROPONINI in the last 168 hours. No results for input(s): TROPIPOC in the last 168 hours.  BNPNo results for input(s): BNP, PROBNP in the last 168 hours.  DDimer No results for input(s): DDIMER in the last 168 hours.  Radiology/Studies:  No results found.  Assessment and Plan:   1. Acute inferoposterior STEMI 2. 2-1 AV block secondary to #1 3. Hyperlipidemia with statin allergy 4. Obesity  The  patient presents with an acute inferoposterior STEMI, located by 2-1 AV block with ongoing chest pain. Emergency cardiac catheterization and PCI will be performed. Emergency implied consent is obtained. The patient is given 4000 units of IV heparin. Will consider temporary transvenous pacing if needed. Further plan/disposition pending his cardiac catheterization results.  Severity of Illness: The appropriate patient status for this patient is INPATIENT. Inpatient status is judged to be reasonable and necessary in order to provide the required intensity of service to ensure the patient's safety. The patient's presenting symptoms, physical exam findings, and initial radiographic and laboratory data in the context of their chronic comorbidities is felt to place them at high risk for further clinical deterioration. Furthermore, it is not anticipated that the patient will be  medically stable for discharge from the hospital within 2 midnights of admission. The following factors support the patient status of inpatient.   " The patient's presenting symptoms include chest pain. " The worrisome physical exam findings include bradycardia. " The initial radiographic and laboratory data are worrisome because of EKG demonstrating acute STEMI. " The chronic co-morbidities include hyperlipidemia and obesity.   * I certify that at the point of admission it is my clinical judgment that the patient will require inpatient hospital care spanning beyond 2 midnights from the point of admission due to high intensity of service, high risk for further deterioration and high frequency of surveillance required.Deatra James, MD  04/03/2017 11:24 AM

## 2017-04-03 NOTE — Progress Notes (Signed)
ANTICOAGULATION CONSULT NOTE - Initial Consult  Pharmacy Consult for tirofiban Indication: s/p PCI  Allergies  Allergen Reactions  . Statins     Pain in joints    Patient Measurements: Weight: 213 lb 13.5 oz (97 kg) Heparin Dosing Weight:   Vital Signs: BP: 123/72 (06/27 1123) Pulse Rate: 57 (06/27 1123)  Labs:  Recent Labs  04/03/17 1039 04/03/17 1045  HGB 14.0 14.3  HCT 41.6 42.0  PLT 188  --   APTT >200*  --   LABPROT 14.5  --   INR 1.12  --   CREATININE 1.02 0.90  TROPONINI 0.17*  --     CrCl cannot be calculated (Unknown ideal weight.).   Medical History: Past Medical History:  Diagnosis Date  . Acute inferoposterior myocardial infarction ([place]) 04/03/2017  . Arthritis   . Gout   . Hyperlipemia   . IBS (irritable bowel syndrome)   . Renal disorder    kidney stones  . Seasonal allergies   . Sinus congestion    chronic  . Sleep apnea    uses a c-pap    Assessment: 76 yo male s/p PCI.  Tirofiban started in cath lab, pharmacy asked to continue x 6 hrs.  Initiated ~ 1045 AM.  No bleeding or complications noted currently.  Goal of Therapy:  Monitor platelets by anticoagulation protocol: Yes   Plan:  Continue tirofiban x 6 hrs. Daily CBC.  Pharmacy to sign off.  Uvaldo Rising, BCPS  Clinical Pharmacist Pager [phone]  04/03/2017 12:50 PM

## 2017-04-04 ENCOUNTER — Encounter (HOSPITAL_COMMUNITY): Payer: Self-pay

## 2017-04-04 LAB — CBC
HEMATOCRIT: 37.7 % — AB (ref 39.0–52.0)
Hemoglobin: 12.6 g/dL — ABNORMAL LOW (ref 13.0–17.0)
MCH: 32.1 pg (ref 26.0–34.0)
MCHC: 33.4 g/dL (ref 30.0–36.0)
MCV: 96.2 fL (ref 78.0–100.0)
Platelets: 185 10*3/uL (ref 150–400)
RBC: 3.92 MIL/uL — ABNORMAL LOW (ref 4.22–5.81)
RDW: 13.7 % (ref 11.5–15.5)
WBC: 7.3 10*3/uL (ref 4.0–10.5)

## 2017-04-04 LAB — BASIC METABOLIC PANEL
Anion gap: 6 (ref 5–15)
BUN: 16 mg/dL (ref 6–20)
CHLORIDE: 108 mmol/L (ref 101–111)
CO2: 24 mmol/L (ref 22–32)
Calcium: 8.8 mg/dL — ABNORMAL LOW (ref 8.9–10.3)
Creatinine, Ser: 1.01 mg/dL (ref 0.61–1.24)
GFR calc Af Amer: >60 mL/min (ref >60)
GFR calc non Af Amer: >60 mL/min (ref >60)
GLUCOSE: 130 mg/dL — AB (ref 65–99)
POTASSIUM: 4 mmol/L (ref 3.5–5.1)
Sodium: 138 mmol/L (ref 135–145)

## 2017-04-04 LAB — LIPID PANEL
Cholesterol: 157 mg/dL (ref 0–200)
HDL: 39 mg/dL — AB (ref >40)
LDL CALC: 58 mg/dL (ref 0–99)
Total CHOL/HDL Ratio: 4 RATIO
Triglycerides: 302 mg/dL — ABNORMAL HIGH (ref <150)
VLDL: 60 mg/dL — ABNORMAL HIGH (ref 0–40)

## 2017-04-04 LAB — HEMOGLOBIN A1C
Hgb A1c MFr Bld: 5.6 % (ref 4.8–5.6)
MEAN PLASMA GLUCOSE: 114 mg/dL

## 2017-04-04 LAB — TROPONIN I: TROPONIN I: 17.04 ng/mL — AB (ref <0.03)

## 2017-04-04 NOTE — Progress Notes (Signed)
CARDIAC REHAB PHASE I   PRE:  Rate/Rhythm: 63 SR  BP:  Supine:   Sitting: 102/54  Standing:    SaO2: 98%RA  MODE:  Ambulation: 575 ft   POST:  Rate/Rhythm: 76 SR  BP:  Supine:   Sitting: 115/54  Standing:    SaO2: 99%RA 1035-1143 Pt walked 575 ft with steady gait. Tolerated well. No CP. MI education completed with pt and wife who voiced understanding. Stressed importance of brilinta with stent. Needs to see case manager for brilinta card. Reviewed MI restrictions, NTG use, risk factors, ex ed and gave heart healthy diet. Pt eats very low carb but lots of protein. Encouraged him to discuss with our dietitian when he does CRP 2. Referring to [facility] program. Also pt drinks 3 to 4 glasses of wine a day. Encouraged him to discuss with cardiologist. Discussed what was considered moderate as 2 beers a day for men and 1 glass of wine for women. He stated he would talk with Dr Burt Knack.   Graylon Good, RN BSN  04/04/2017 11:40 AM

## 2017-04-04 NOTE — Care Management Note (Signed)
Case Management Note Marvetta Gibbons RN, BSN Unit 2W-Case Manager-- [place] coverage [phone]  Patient Details  Name: Timothy Wilcox MRN: [id number] Date of Birth: 03/08/1941  Subjective/Objective:   Pt admitted with STEMI s/p PCI                 Action/Plan: PTA pt lived at home with wife - anticipate return home- noted pt started on Brilinta- submitted benefits check for coverage info- copay cost $142/mo- spoke with pt at bedside- coverage info shared- not sure if drug is tier 3 or 4- per pt wife has gone home to check their drug plan to see- pt given 30 day free card to use on discharge- per pt he likes to shop around for best price as far as pharmacy- offered to call whatever pharmacy he would like to use to check stock prior to discharge.- pt states he can take care of checking. Pt would like to speak with MD regarding other options to Brilinta before committing to Brilinta due to cost of drug.   Expected Discharge Date:     04/05/17             Expected Discharge Plan:  Home/Self Care  In-House Referral:     Discharge planning Services  CM Consult, Medication Assistance  Post Acute Care Choice:  NA Choice offered to:  NA  DME Arranged:    DME Agency:     HH Arranged:    HH Agency:     Status of Service:  completed  If discussed at [place] of Stay Meetings, dates discussed:    Discharge Disposition: home/self care   Additional Comments:  Dawayne Patricia, RN 04/04/2017, 10:21 AM

## 2017-04-04 NOTE — Progress Notes (Signed)
Pt arrived to 2w from 2h. Vitals obtained. Telemetry box applied and CCMD notified. Pt oriented to room and staff. Pt denies needs at this time. Will continue current plan of care.  Grant Fontana BSN, RN

## 2017-04-04 NOTE — Discharge Summary (Signed)
Discharge Summary    Patient ID: Timothy Wilcox,  MRN: [id number], DOB/AGE: [birth year] 76 y.o.  Admit date: 04/03/2017 Discharge date: 04/05/2017  Primary Care Provider: Hulan Fess Primary Cardiologist: Dr. Burt Knack  Discharge Diagnoses    Active Problems:   Acute inferoposterior myocardial infarction [facility])   STEMI involving right coronary artery [facility])   STEMI (ST elevation myocardial infarction) ([place])   Hyperlipidemia   Obesity   Allergies Allergies  Allergen Reactions  . Statins Other (See Comments)    Pain in joints, cant move knees  . Tagamet [Cimetidine] Other (See Comments)    Gynecomastia     Diagnostic Studies/Procedures    LHC: 04/03/17  Conclusion   1. Acute inferoposterior STEMI treated successfully with primary PCI using a drug-eluting stent extending from the distal RCA into the posterior AV segment 2. Moderate left circumflex stenosis 3. Widely patent left main and LAD with minimal nonobstructive disease 4. Mild to moderate segmental LV dysfunction with severe hypokinesis of the inferior wall, LVEF estimated at 45-50%  Recommend:  Aspirin and brilinta 12 months. Brilinta 180 mg administered in the Cath Lab  Aggrastat 6 hours  If no complications arise consider hospital discharge within 48 hours  Aggressive risk reduction measures. Unfortunately patient is statin allergic     History of Present Illness     Timothy Wilcox is a 76 year old gentleman who developed severe substernal chest discomfort approximately one hour prior to admission. He had a similar episode 3 days ago but that morning's episode is more severe. He described the pain as pressure and burning. The pain was nonradiating. There was associated weakness and dizziness. There was also associated nausea with no vomiting. He was short of breath with this but his breathing improved by the time of arrival. EMS was called and an EKG in the field demonstrates an acute inferoposterior  STEMI. A code STEMI has activated and the patient was brought directly to the cardiac catheterization lab. On arrival he remained uncomfortable with chest pain. He reported no other acute complaints.  Hospital Course     Consultants: None   Underwent LHC with Dr. Burt Knack noted above with PCI using DES to dRCA, with moderate LCx disease. EF by LV gram was 45-50% with severe hypokinesis of the inferior wall. Plan for DAPT with ASA/Brilinta for one year. Reported being allergic to statins. Said in the past he was only on statin for 10 days and developed severe knee pain that lasted for many months even after stopping. The patient will work on lifestyle modification. May consider lipid clinic referral as outpatient. Trop peaked at 21.36. LDL 89, Trig 287. Post cath lab showed Cr 1.01 and Hgb 12.6. He was transferred to telemetry on 04/04/17. Worked well with cardiac rehab. Recommended reduced alcohol intake.   He was seen by Dr. Burt Knack and determined stable for discharge home. Follow up in the office has been arranged. Medications are listed below.    Discharge Vitals Blood pressure 119/61, pulse (!) 56, temperature 98 F (36.7 C), temperature source Oral, resp. rate 18, height 5\' 7"  (1.702 m), weight 224 lb 13.9 oz (102 kg), SpO2 97 %.  Filed Weights   04/03/17 1200 04/03/17 1600 04/05/17 0800  Weight: 213 lb 13.5 oz (97 kg) 224 lb 13.9 oz (102 kg) 224 lb 13.9 oz (102 kg)    Labs & Radiologic Studies    CBC  Recent Labs  04/03/17 1039 04/03/17 1045 04/04/17 0008  WBC 4.2  --  7.3  HGB 14.0 14.3 12.6*  HCT 41.6 42.0 37.7*  MCV 94.1  --  96.2  PLT 188  --  962   Basic Metabolic Panel  Recent Labs  04/03/17 1039 04/03/17 1045 04/04/17 0008  NA 139 141 138  K 3.7 3.7 4.0  CL 109 109 108  CO2 17*  --  24  GLUCOSE 153* 155* 130*  BUN 15 16 16   CREATININE 1.02 0.90 1.01  CALCIUM 9.0  --  8.8*   Liver Function Tests  Recent Labs  04/03/17 1039  AST 33  ALT 38  ALKPHOS 53    BILITOT 0.9  PROT 6.4*  ALBUMIN 3.6   No results for input(s): LIPASE, AMYLASE in the last 72 hours. Cardiac Enzymes  Recent Labs  04/03/17 1039 04/03/17 1844 04/04/17 0008  TROPONINI 0.17* 21.36* 17.04*   BNP Invalid input(s): POCBNP D-Dimer No results for input(s): DDIMER in the last 72 hours. Hemoglobin A1C  Recent Labs  04/03/17 1039  HGBA1C 5.6   Fasting Lipid Panel  Recent Labs  04/04/17 0008  CHOL 157  HDL 39*  LDLCALC 58  TRIG 302*  CHOLHDL 4.0   Thyroid Function Tests No results for input(s): TSH, T4TOTAL, T3FREE, THYROIDAB in the last 72 hours.  Invalid input(s): FREET3 _____________  No results found. Disposition   Pt is being discharged home today in good condition.  Follow-up Plans & Appointments    Follow-up Information    [place], [place], [place]. Go on 04/16/2017.   Specialty:  Cardiology Why:  @ 2pm for hospital follow up  Contact information: [address] [address] [phone]          Discharge Instructions    Amb Referral to Cardiac Rehabilitation    Complete by:  As directed    Diagnosis:   Coronary Stents STEMI     Diet - low sodium heart healthy    Complete by:  As directed    Discharge instructions    Complete by:  As directed    No driving for 2 weeks. No lifting over 10 lbs for 4 weeks. No sexual activity for 4 weeks. You may not return to work until cleared by your cardiologist. Keep procedure site clean & dry. If you notice increased pain, swelling, bleeding or pus, call/return!  You may shower, but no soaking baths/hot tubs/pools for 1 week.   Increase activity slowly    Complete by:  As directed       Discharge Medications   Current Discharge Medication List    START taking these medications   Details  metoprolol succinate (TOPROL XL) 25 MG 24 hr tablet Take 1 tablet (25 mg total) by mouth daily. Qty: 30 tablet, Refills: 11    nitroGLYCERIN (NITROSTAT) 0.4 MG SL tablet Place 1  tablet (0.4 mg total) under the tongue every 5 (five) minutes x 3 doses as needed for chest pain. Qty: 25 tablet, Refills: 12    ticagrelor (BRILINTA) 90 MG TABS tablet Take 1 tablet (90 mg total) by mouth 2 (two) times daily. Qty: 180 tablet, Refills: 3      CONTINUE these medications which have CHANGED   Details  allopurinol (ZYLOPRIM) 100 MG tablet Take 1 tablet (100 mg total) by mouth daily. Qty: 30 tablet, Refills: 2    aspirin EC 81 MG tablet Take 1 tablet (81 mg total) by mouth once. Qty: 1 tablet, Refills: 0      CONTINUE these medications which have NOT CHANGED  Details  fluticasone (FLONASE) 50 MCG/ACT nasal spray Place 1 spray into both nostrils 2 (two) times daily.    ibuprofen (ADVIL,MOTRIN) 200 MG tablet Take 400 mg by mouth every 6 (six) hours as needed for headache or moderate pain.     loratadine (CLARITIN) 10 MG tablet Take 10 mg by mouth 2 (two) times daily.     Multiple Vitamin (MULTIVITAMIN WITH MINERALS) TABS Take 1 tablet by mouth daily.    sildenafil (VIAGRA) 100 MG tablet Take 100 mg by mouth daily as needed for erectile dysfunction.     tamsulosin (FLOMAX) 0.4 MG CAPS Take 0.4 mg by mouth as needed (used if her has a kidney stone).      STOP taking these medications     HYDROmorphone (DILAUDID) 2 MG tablet          Aspirin prescribed at discharge?  Yes High Intensity Statin Prescribed? (Lipitor 40-80mg  or Crestor 20-40mg ): No --> statin intolerance Beta Blocker Prescribed? Yes For EF <40%, was ACEI/ARB Prescribed? N/A ADP Receptor Inhibitor Prescribed? (i.e. Plavix etc.-Includes Medically Managed Patients): Yes For EF <40%, Aldosterone Inhibitor Prescribed? N/a not in CHF Was EF assessed during THIS hospitalization? Yes Was Cardiac Rehab II ordered? (Included Medically managed Patients): Yes   Outstanding Labs/Studies   None   Duration of Discharge Encounter   Greater than 30 minutes including physician  time.  Signed, Leanor Kail, PA-C  04/05/2017, 11:27 AM

## 2017-04-04 NOTE — Progress Notes (Signed)
Per insurance check on Brilinta Co-pay amount at his [place] $142.00.  Briilinta bid 90mg .( according to his medicare part D plan.

## 2017-04-04 NOTE — Progress Notes (Signed)
Progress Note  Patient Name: Timothy Wilcox Date of Encounter: 04/04/2017  Primary Cardiologist: New/Alexandr Oehler  Subjective   Feels well. No CP or dyspnea.   Inpatient Medications    Scheduled Meds: . allopurinol  100 mg Oral Daily  . aspirin EC  81 mg Oral Daily  . enoxaparin (LOVENOX) injection  40 mg Subcutaneous Q24H  . metoprolol tartrate  25 mg Oral BID  . sodium chloride flush  3 mL Intravenous Q12H  . ticagrelor  90 mg Oral BID   Continuous Infusions: . sodium chloride     PRN Meds: sodium chloride, acetaminophen, nitroGLYCERIN, ondansetron (ZOFRAN) IV, oxyCODONE-acetaminophen, sodium chloride flush   Vital Signs    Vitals:   04/04/17 0400 04/04/17 0500 04/04/17 0600 04/04/17 0700  BP: (!) 148/58 (!) 155/66 (!) 119/58 (!) 125/54  Pulse: (!) 55 (!) 51 (!) 52 (!) 58  Resp: 20 19 18  (!) 22  Temp:      TempSrc:      SpO2: 99% 97% 98% 100%  Weight:      Height:        Intake/Output Summary (Last 24 hours) at 04/04/17 0816 Last data filed at 04/04/17 0000  Gross per 24 hour  Intake            960.5 ml  Output             1400 ml  Net           -439.5 ml   Filed Weights   04/03/17 1200 04/03/17 1600  Weight: 213 lb 13.5 oz (97 kg) 224 lb 13.9 oz (102 kg)    Telemetry    Sinus brady - Personally Reviewed  ECG    NSR age-indeterminate inferior infarct - Personally Reviewed  Physical Exam  Alert, oriented male in NAD, sitting up in chair GEN: No acute distress.   Neck: No JVD Cardiac: RRR, no murmurs, rubs, or gallops.  Respiratory: Clear to auscultation bilaterally. GI: Soft, nontender, non-distended  MS: No edema; No deformity. Neuro:  Nonfocal  Psych: Normal affect   Labs    Chemistry Recent Labs Lab 04/03/17 1039 04/03/17 1045 04/04/17 0008  NA 139 141 138  K 3.7 3.7 4.0  CL 109 109 108  CO2 17*  --  24  GLUCOSE 153* 155* 130*  BUN 15 16 16   CREATININE 1.02 0.90 1.01  CALCIUM 9.0  --  8.8*  PROT 6.4*  --   --   ALBUMIN 3.6   --   --   AST 33  --   --   ALT 38  --   --   ALKPHOS 53  --   --   BILITOT 0.9  --   --   GFRNONAA >60  --  >60  GFRAA >60  --  >60  ANIONGAP 13  --  6     Hematology Recent Labs Lab 04/03/17 1039 04/03/17 1045 04/04/17 0008  WBC 4.2  --  7.3  RBC 4.42  --  3.92*  HGB 14.0 14.3 12.6*  HCT 41.6 42.0 37.7*  MCV 94.1  --  96.2  MCH 31.7  --  32.1  MCHC 33.7  --  33.4  RDW 13.6  --  13.7  PLT 188  --  185    Cardiac Enzymes Recent Labs Lab 04/03/17 1039 04/03/17 1844 04/04/17 0008  TROPONINI 0.17* 21.36* 17.04*   No results for input(s): TROPIPOC in the last 168 hours.   BNPNo results for  input(s): BNP, PROBNP in the last 168 hours.   DDimer No results for input(s): DDIMER in the last 168 hours.   Radiology    No results found.   Patient Profile     76 y.o. male presenting 6/27 with an ancute inferoposterior STEMI  Assessment & Plan    Acute inferoposterior STEMI: s/p Primary PCI (Promus DES distal RCA). Good ST resolution on EKG is good prognostic sign. Troponin peak 21. Cardiac rehab today. Mobilize. Tx tele bed. Anticipate home tomorrow am. Continue ASA and brilinta, metoprolol 25 mg BID will reduce to 12.5 mg BID with bradycardia.   Lipids: LDL 58 mg/dL, trig elevated at 302. Lifestyle modification reviewed with patient.   Dispo: tx tele today. Check CXR. Mobilize, education with Phase 1 CRHB.  Deatra James, MD  04/04/2017, 8:16 AM

## 2017-04-05 ENCOUNTER — Telehealth: Payer: Self-pay

## 2017-04-05 MED ORDER — ASPIRIN EC 81 MG PO TBEC: 81.0000 mg | DELAYED_RELEASE_TABLET | Freq: Once | ORAL | 0 refills | Status: AC

## 2017-04-05 MED ORDER — TICAGRELOR 90 MG PO TABS: 90.0000 mg | ORAL_TABLET | Freq: Two times a day (BID) | ORAL | 3 refills | Status: DC

## 2017-04-05 MED ORDER — NITROGLYCERIN 0.4 MG SL SUBL: 0.4000 mg | SUBLINGUAL_TABLET | SUBLINGUAL | 12 refills | Status: DC | PRN

## 2017-04-05 MED ORDER — METOPROLOL SUCCINATE ER 25 MG PO TB24: 25.0000 mg | ORAL_TABLET | Freq: Every day | ORAL | 11 refills | Status: DC

## 2017-04-05 MED ORDER — ALLOPURINOL 100 MG PO TABS
100.0000 mg | ORAL_TABLET | Freq: Every day | ORAL | 2 refills | Status: DC
Start: 2017-04-05 — End: 2017-04-12

## 2017-04-05 NOTE — Progress Notes (Signed)
1107 Checked with pt to see if any questions re ed done yesterday. Offered to walk but pt walked 1000 ft 10 minutes ago without CP. Tolerated well. Looking forward to discharge. Graylon Good RN BSN 04/05/2017 11:14 AM

## 2017-04-05 NOTE — Telephone Encounter (Signed)
-----   Message from Howie Ill sent at 04/05/2017 11:14 AM EDT ----- Regarding: TCM Vin 04/16/17 2p

## 2017-04-05 NOTE — Telephone Encounter (Signed)
Per review of Pt chart, Pt remains hospitalized at this time.  Will cont to follow for TCM needs.

## 2017-04-05 NOTE — Progress Notes (Signed)
Progress Note  Patient Name: Timothy Wilcox Date of Encounter: 04/05/2017  Primary Cardiologist: New/Seniyah Esker  Subjective   Patient feels fine. No chest pain or shortness of breath. He walked with cardiac rehabilitation this morning without symptoms.  Inpatient Medications    Scheduled Meds: . allopurinol  100 mg Oral Daily  . aspirin EC  81 mg Oral Daily  . enoxaparin (LOVENOX) injection  40 mg Subcutaneous Q24H  . metoprolol tartrate  25 mg Oral BID  . sodium chloride flush  3 mL Intravenous Q12H  . ticagrelor  90 mg Oral BID   Continuous Infusions: . sodium chloride     PRN Meds: sodium chloride, acetaminophen, nitroGLYCERIN, ondansetron (ZOFRAN) IV, oxyCODONE-acetaminophen, sodium chloride flush   Vital Signs    Vitals:   04/04/17 1430 04/04/17 1956 04/05/17 0439 04/05/17 0800  BP: (!) 126/50 116/61 122/64 119/61  Pulse: 64 (!) 59 (!) 57 (!) 56  Resp:  18 18 18   Temp: 98.5 F (36.9 C) 98.2 F (36.8 C) 98.6 F (37 C) 98 F (36.7 C)  TempSrc: Oral Oral Oral Oral  SpO2: 100% 100% 99% 97%  Weight:    224 lb 13.9 oz (102 kg)  Height:    5\' 7"  (1.702 m)    Intake/Output Summary (Last 24 hours) at 04/05/17 1050 Last data filed at 04/04/17 1300  Gross per 24 hour  Intake              100 ml  Output                0 ml  Net              100 ml   Filed Weights   04/03/17 1200 04/03/17 1600 04/05/17 0800  Weight: 213 lb 13.5 oz (97 kg) 224 lb 13.9 oz (102 kg) 224 lb 13.9 oz (102 kg)    Telemetry    Sinus rhythm/sinus bradycardia no significant arrhythmia - Personally Reviewed  ECG    Sinus bradycardia 58 bpm, age indeterminate inferior infarct - Personally Reviewed  Physical Exam  Alert and oriented, no acute distress GEN: No acute distress.   Neck: No JVD Cardiac: RRR, no murmurs, rubs, or gallops.  Respiratory: Clear to auscultation bilaterally. GI: Soft, nontender, non-distended  MS: No edema; No deformity. Neuro:  Nonfocal  Psych: Normal  affect   Labs    Chemistry Recent Labs Lab 04/03/17 1039 04/03/17 1045 04/04/17 0008  NA 139 141 138  K 3.7 3.7 4.0  CL 109 109 108  CO2 17*  --  24  GLUCOSE 153* 155* 130*  BUN 15 16 16   CREATININE 1.02 0.90 1.01  CALCIUM 9.0  --  8.8*  PROT 6.4*  --   --   ALBUMIN 3.6  --   --   AST 33  --   --   ALT 38  --   --   ALKPHOS 53  --   --   BILITOT 0.9  --   --   GFRNONAA >60  --  >60  GFRAA >60  --  >60  ANIONGAP 13  --  6     Hematology Recent Labs Lab 04/03/17 1039 04/03/17 1045 04/04/17 0008  WBC 4.2  --  7.3  RBC 4.42  --  3.92*  HGB 14.0 14.3 12.6*  HCT 41.6 42.0 37.7*  MCV 94.1  --  96.2  MCH 31.7  --  32.1  MCHC 33.7  --  33.4  RDW  13.6  --  13.7  PLT 188  --  185    Cardiac Enzymes Recent Labs Lab 04/03/17 1039 04/03/17 1844 04/04/17 0008  TROPONINI 0.17* 21.36* 17.04*   No results for input(s): TROPIPOC in the last 168 hours.   BNPNo results for input(s): BNP, PROBNP in the last 168 hours.   DDimer No results for input(s): DDIMER in the last 168 hours.   Radiology    No results found.   Patient Profile     76 y.o. 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 Acute inferoposterior STEMI: Patient has done very well following primary PCI. His EKG has demonstrated resolution of his ST segment elevation. He is stable for discharge today on aspirin and brilinta. I'm going to change his beta blocker to metoprolol succinate 25 mg at bedtime. He is statin intolerant.  2. Hyperlipidemia: The patient will work on lifestyle modification. He has taken a statin in the past and had long-standing severe muscle weakness that took years to resolve. He will not try another statin drug.  3. Obesity: Lifestyle modification discussed. Reduction in alcohol intake discussed. I recommended that he drink no more than 2 drinks daily.  Disposition: Home this morning. Should have a hospital follow-up visit in 1-2 weeks.  He would benefit from outpatient cardiac rehabilitation and we discussed this this morning.  Deatra James, MD  04/05/2017, 10:50 AM

## 2017-04-08 NOTE — Telephone Encounter (Signed)
Changed appt to [facility] office with [facility] 04/12/2017 @ 0900.  Pt notified.  Pt appreciative.  No further needs.

## 2017-04-08 NOTE — Telephone Encounter (Signed)
Patient contacted regarding discharge from [facility] on 04/05/2017. Patient understands to follow up with provider Robbie Lis on 04/16/2017 at 1400 at [facility] office.  This is a problem for the Pt.  Pt leaving for vacation for 2 weeks.  Keeping this appt would cause Pt to have to do many hours of extra driving.  Pt s/p STEMI.  Will follow up tomorrow to see if appt can be changed. Patient understands discharge instructions? yes Patient understands medications and regiment? yes Patient understands to bring all medications to this visit? yes  Per patient, he is feeling generally tired and weak.  No sob or chest pain.  Suggested Pt try taking metoprolol at bedtime, as one side effect is sleepiness.  Pt states he will try that. Pt going on vacation next week, need to change Pt follow up appt. Will follow up tomorrow and call pt.  Pt denies any addl educational needs.

## 2017-04-11 ENCOUNTER — Telehealth (HOSPITAL_COMMUNITY): Payer: Self-pay

## 2017-04-11 NOTE — Telephone Encounter (Signed)
Patient insurance is active and benefits verified. Patient has [employer] - $45.00 co-payment, no deductible, out of pocket $4500/$155.33 has been met, no co-insurance and no limit on visit. Passport/reference [phone].  Patient will be contacted and scheduled after their follow up appointment with the cardiologist office on 04/16/17, upon review by [facility] RN navigator.

## 2017-04-12 ENCOUNTER — Ambulatory Visit (INDEPENDENT_AMBULATORY_CARE_PROVIDER_SITE_OTHER): Payer: Medicare HMO | Admitting: Cardiology

## 2017-04-12 ENCOUNTER — Encounter: Payer: Self-pay | Admitting: Cardiology

## 2017-04-12 VITALS — BP 116/70 | HR 60 | Ht 67.0 in | Wt 219.2 lb

## 2017-04-12 DIAGNOSIS — I2111 ST elevation (STEMI) myocardial infarction involving right coronary artery: Secondary | ICD-10-CM

## 2017-04-12 NOTE — Progress Notes (Signed)
04/12/2017 Timothy Wilcox   01/26/1941  [id number]  Primary Physician Hulan Fess, MD Primary Cardiologist: Dr Burt Knack  HPI:  76 y/o overweight male presented 04/03/17 with an inferior STEMI. 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 He developed SSCP and Lt arm pain. He became sweaty. He took an ASA and laid down in the shade and his symptoms passed. Five days later he developed SSCP with diaphoresis again after breakfast and EMS was called. He was taken directly to the cath lab as a STEMI. Cath revealed a total distal RCA that was stented. He had a residual 60% mCFX stenosis. EF was 45-50%. He is in the office today for follow up. He has had no further chest pain. He does have a past history of statin intolerance, he had knee pain on Simvastatin. He will not take a statin now. His LDL in the hospital was 58.    Current Outpatient Prescriptions  Medication Sig Dispense Refill  . allopurinol (ZYLOPRIM) 300 MG tablet Take 300 mg by mouth daily.    Marland Kitchen aspirin EC 81 MG tablet Take 81 mg by mouth daily.    . fluticasone (FLONASE) 50 MCG/ACT nasal spray Place 1 spray into both nostrils 2 (two) times daily.    Marland Kitchen ibuprofen (ADVIL,MOTRIN) 200 MG tablet Take 400 mg by mouth every 6 (six) hours as needed for headache or moderate pain.     Marland Kitchen loratadine (CLARITIN) 10 MG tablet Take 10 mg by mouth 2 (two) times daily.     . metoprolol succinate (TOPROL XL) 25 MG 24 hr tablet Take 1 tablet (25 mg total) by mouth daily. 30 tablet 11  . Multiple Vitamin (MULTIVITAMIN WITH MINERALS) TABS Take 1 tablet by mouth daily.    . nitroGLYCERIN (NITROSTAT) 0.4 MG SL tablet Place 1 tablet (0.4 mg total) under the tongue every 5 (five) minutes x 3 doses as needed for chest pain. 25 tablet 12  . sildenafil (VIAGRA) 100 MG tablet Take 100 mg by mouth daily as needed for erectile dysfunction.     . tamsulosin (FLOMAX) 0.4 MG CAPS Take 0.4 mg by mouth as  needed (used if her has a kidney stone).    . ticagrelor (BRILINTA) 90 MG TABS tablet Take 1 tablet (90 mg total) by mouth 2 (two) times daily. 180 tablet 3   No current facility-administered medications for this visit.     Allergies  Allergen Reactions  . Statins Other (See Comments)    Pain in joints, cant move knees  . Tagamet [Cimetidine] Other (See Comments)    Gynecomastia     Past Medical History:  Diagnosis Date  . Acute inferoposterior myocardial infarction ([place]) 04/03/2017  . Arthritis   . Gout   . Hyperlipemia   . IBS (irritable bowel syndrome)   . Renal disorder    kidney stones  . Seasonal allergies   . Sinus congestion    chronic  . Sleep apnea    uses a c-pap    Social History   Social History  . Marital status: Married    Spouse name: N/A  . Number of children: N/A  . Years of education: N/A   Occupational History  . Not on file.   Social History Main Topics  . Smoking status: Never Smoker  . Smokeless tobacco: Never Used  . Alcohol use Yes     Comment: occ  . Drug use:  No  . Sexual activity: Not on file   Other Topics Concern  . Not on file   Social History Narrative  . No narrative on file     No family history on file.   Review of Systems: General: negative for chills, fever, night sweats or weight changes.  Cardiovascular: negative for chest pain, dyspnea on exertion, edema, orthopnea, palpitations, paroxysmal nocturnal dyspnea or shortness of breath Dermatological: negative for rash Respiratory: negative for cough or wheezing Urologic: negative for hematuria Abdominal: negative for nausea, vomiting, diarrhea, bright red blood per rectum, melena, or hematemesis Neurologic: negative for visual changes, syncope, or dizziness All other systems reviewed and are otherwise negative except as noted above.    Blood pressure 116/70, pulse 60, height 5\' 7"  (1.702 m), weight 219 lb 3.2 oz (99.4 kg).  General appearance: alert,  cooperative, no distress and moderately obese Neck: no carotid bruit and no JVD Lungs: clear to auscultation bilaterally Heart: regular rate and rhythm Extremities: extremities normal, atraumatic, no cyanosis or edema Skin: Skin color, texture, turgor normal. No rashes or lesions Neurologic: Grossly normal  EKG NSR inferior TWI  ASSESSMENT AND PLAN:   STEMI involving right coronary artery (HCC) Inferior STEMI treated with DES 04/03/17   PLAN  His LDL is too low to consider PCSK9 agent. Same Rx. F/U with dr Burt Knack 6 weeks.   Kerin Ransom PA-C 04/12/2017 10:02 AM

## 2017-04-12 NOTE — Assessment & Plan Note (Signed)
Inferior STEMI treated with DES 04/03/17

## 2017-04-12 NOTE — Patient Instructions (Addendum)
Medication Instructions:  Your physician recommends that you continue on your current medications as directed. Please refer to the Current Medication list given to you today.  Follow-Up: Dr. Burt Knack in 6 weeks at [facility]: [address]   Any Other Special Instructions Will Be Listed Below (If Applicable).     If you need a refill on your cardiac medications before your next appointment, please call your pharmacy.

## 2017-04-16 ENCOUNTER — Ambulatory Visit: Payer: Medicare HMO | Admitting: Physician Assistant

## 2017-04-19 ENCOUNTER — Telehealth (HOSPITAL_COMMUNITY): Payer: Self-pay

## 2017-04-19 NOTE — Telephone Encounter (Signed)
I called and left message on patient voicemail to call office about scheduling for cardiac rehab. I left office contact information on patient voicemail to return call.  ° °

## 2017-04-27 DIAGNOSIS — R079 Chest pain, unspecified: Secondary | ICD-10-CM | POA: Diagnosis not present

## 2017-04-27 DIAGNOSIS — Z9981 Dependence on supplemental oxygen: Secondary | ICD-10-CM | POA: Diagnosis not present

## 2017-04-27 DIAGNOSIS — R0600 Dyspnea, unspecified: Secondary | ICD-10-CM | POA: Diagnosis not present

## 2017-04-27 DIAGNOSIS — R0789 Other chest pain: Secondary | ICD-10-CM | POA: Diagnosis not present

## 2017-04-27 DIAGNOSIS — I482 Chronic atrial fibrillation: Secondary | ICD-10-CM | POA: Diagnosis not present

## 2017-04-27 DIAGNOSIS — R0602 Shortness of breath: Secondary | ICD-10-CM | POA: Diagnosis not present

## 2017-04-28 DIAGNOSIS — R079 Chest pain, unspecified: Secondary | ICD-10-CM | POA: Diagnosis not present

## 2017-04-30 ENCOUNTER — Encounter (INDEPENDENT_AMBULATORY_CARE_PROVIDER_SITE_OTHER): Payer: Self-pay

## 2017-04-30 ENCOUNTER — Ambulatory Visit (INDEPENDENT_AMBULATORY_CARE_PROVIDER_SITE_OTHER): Payer: Medicare HMO | Admitting: Cardiovascular Disease

## 2017-04-30 ENCOUNTER — Telehealth: Payer: Self-pay | Admitting: Cardiovascular Disease

## 2017-04-30 ENCOUNTER — Encounter: Payer: Self-pay | Admitting: Cardiovascular Disease

## 2017-04-30 VITALS — BP 140/70 | HR 66 | Ht 67.0 in | Wt 215.0 lb

## 2017-04-30 DIAGNOSIS — R0602 Shortness of breath: Secondary | ICD-10-CM

## 2017-04-30 DIAGNOSIS — Z955 Presence of coronary angioplasty implant and graft: Secondary | ICD-10-CM | POA: Diagnosis not present

## 2017-04-30 MED ORDER — CLOPIDOGREL BISULFATE 75 MG PO TABS: 75.0000 mg | ORAL_TABLET | Freq: Every day | ORAL | 3 refills | Status: DC

## 2017-04-30 NOTE — Patient Instructions (Signed)
Medication Instructions:  Your physician has recommended you make the following change in your medication:  1. STOP Brilinta 2. START Plavix (clopidogrel) 75mg  one tablet by mouth daily, Please take your first dosage tonight  Labwork: No new orders.   Testing/Procedures: No new orders.   Follow-Up: Your physician recommends that you keep your scheduled follow-up appointment next week with Dr Burt Knack.   Any Other Special Instructions Will Be Listed Below (If Applicable).     If you need a refill on your cardiac medications before your next appointment, please call your pharmacy.

## 2017-04-30 NOTE — Telephone Encounter (Signed)
I spoke with the pt and he complains of dizziness, weakness and SOB at this time. Last week the pt was out of town in [place], [place] when he developed similar symptoms and was taken by EMS to the local hospital.  EKG and labs were performed and pt was observed.  Additional testing was recommended but the pt wanted to come home and follow-up with Dr Burt Knack.  The pt got home last night and today his symptoms returned.  BP now 143/74, pulse 75 and this morning 110/69, 60. The pt denies chest pain at this time and is not having left arm pain or diaphoresis. I have added the pt onto Dr Antionette Char schedule this afternoon.  If the pt develops CP he will proceed to the ER.

## 2017-04-30 NOTE — Telephone Encounter (Signed)
Mr.Buechler had a stent placed and he is feeling and dizzy . Thanks

## 2017-04-30 NOTE — Progress Notes (Signed)
Cardiology Office Note Date:  05/02/2017   ID:  Melven, Stockard 1941-09-12, MRN [id number]  PCP:  Hulan Fess, MD  Cardiologist:  Sherren Mocha, MD    Chief Complaint  Patient presents with  . Dizziness  . Fatigue  . Shortness of Breath   History of Present Illness: Timothy Wilcox is a 76 y.o. male who presents for evaluation of shortness of breath, weakness, and dizziness.    He's felt weak and dizzy ever since undergoing PCI and leaving the hospital. He has become increasingly weak and dizzy. He was hospitalized in [place], [place] this past Saturday. He's had near-continuous discomfort in the chest since his MI - localized to a spot on the left chest and described as mild. Feels like an 'old bruise.' Complains of shortness of breath. Feels like he's 'not getting enough air.' He is very weak and dizzy. Here with his wife today. No change in symptoms since his evaluation in [place] last weekend. No exertional chest pain. He has had a lot of problems with medication intolerance in the past and wonders if his symptoms are medication related. No frank syncope.    Past Medical History:  Diagnosis Date  . Acute inferoposterior myocardial infarction ([place]) 04/03/2017  . Arthritis   . Gout   . Hyperlipemia   . IBS (irritable bowel syndrome)   . Renal disorder    kidney stones  . Seasonal allergies   . Sinus congestion    chronic  . Sleep apnea    uses a c-pap    Past Surgical History:  Procedure Laterality Date  . COLONOSCOPY    . CORONARY STENT INTERVENTION N/A 04/03/2017   Procedure: Coronary Stent Intervention;  Surgeon: Sherren Mocha, MD;  Location: [facility];  Service: Cardiovascular;  Laterality: N/A;  . INCISION AND DRAINAGE  2011   infected finger  . LEFT HEART CATH AND CORONARY ANGIOGRAPHY N/A 04/03/2017   Procedure: Left Heart Cath and Coronary Angiography;  Surgeon: Sherren Mocha, MD;  Location: [facility];  Service:  Cardiovascular;  Laterality: N/A;  . SHOULDER ARTHROSCOPY WITH ROTATOR CUFF REPAIR AND SUBACROMIAL DECOMPRESSION Right 01/29/2013   Procedure: RIGHT SHOULDER ARTHROSCOPY WITH SUBACROMIAL DECOMPRESSION, THREE TENDON ROTATOR CUFF REPAIR;  Surgeon: Cammie Sickle., MD;  Location: [facility];  Service: Orthopedics;  Laterality: Right;  . TOE DEBRIDEMENT     rt toe cyst    Current Outpatient Prescriptions  Medication Sig Dispense Refill  . allopurinol (ZYLOPRIM) 300 MG tablet Take 300 mg by mouth daily.    Marland Kitchen aspirin EC 81 MG tablet Take 81 mg by mouth daily.    . fluticasone (FLONASE) 50 MCG/ACT nasal spray Place 1 spray into both nostrils 2 (two) times daily.    Marland Kitchen ibuprofen (ADVIL,MOTRIN) 200 MG tablet Take 400 mg by mouth every 6 (six) hours as needed for headache or moderate pain.     Marland Kitchen loratadine (CLARITIN) 10 MG tablet Take 10 mg by mouth 2 (two) times daily.     . metoprolol succinate (TOPROL XL) 25 MG 24 hr tablet Take 1 tablet (25 mg total) by mouth daily. 30 tablet 11  . Multiple Vitamin (MULTIVITAMIN WITH MINERALS) TABS Take 1 tablet by mouth daily.    . nitroGLYCERIN (NITROSTAT) 0.4 MG SL tablet Place 1 tablet (0.4 mg total) under the tongue every 5 (five) minutes x 3 doses as needed for chest pain. 25 tablet 12  . sildenafil (VIAGRA) 100 MG tablet  Take 100 mg by mouth daily as needed for erectile dysfunction.     . tamsulosin (FLOMAX) 0.4 MG CAPS Take 0.4 mg by mouth as needed (used if her has a kidney stone).    . clopidogrel (PLAVIX) 75 MG tablet Take 1 tablet (75 mg total) by mouth daily. 90 tablet 3   No current facility-administered medications for this visit.     Allergies:   Statins; Dicyclomine; and Tagamet [cimetidine]   Social History:  The patient  reports that he has never smoked. He has never used smokeless tobacco. He reports that he drinks alcohol. He reports that he does not use drugs.   Family History:  The patient's family history is not on file.     ROS:  Please see the history of present illness.  All other systems are reviewed and negative.    PHYSICAL EXAM: VS:  BP 140/70   Pulse 66   Ht 5\' 7"  (1.702 m)   Wt 215 lb (97.5 kg)   BMI 33.67 kg/m  , BMI Body mass index is 33.67 kg/m. GEN: Well nourished, well developed, overweight man in no acute distress  HEENT: normal  Neck: no JVD, no masses. No carotid bruits Cardiac: RRR without murmur or gallop                Respiratory:  clear to auscultation bilaterally, normal work of breathing GI: soft, nontender, nondistended, + BS MS: no deformity or atrophy  Ext: no pretibial edema, pedal pulses 2+= bilaterally Skin: warm and dry, no rash Neuro:  Strength and sensation are intact Psych: euthymic mood, full affect  EKG:  EKG is ordered today. The ekg ordered today shows NSR 66 bpm, inferior infarct age-undetermined, no change from previous  Recent Labs: 04/03/2017: ALT 38 04/04/2017: BUN 16; Creatinine, Ser 1.01; Hemoglobin 12.6; Platelets 185; Potassium 4.0; Sodium 138   Lipid Panel     Component Value Date/Time   CHOL 157 04/04/2017 0008   TRIG 302 (H) 04/04/2017 0008   HDL 39 (L) 04/04/2017 0008   CHOLHDL 4.0 04/04/2017 0008   VLDL 60 (H) 04/04/2017 0008   LDLCALC 58 04/04/2017 0008      Wt Readings from Last 3 Encounters:  04/30/17 215 lb (97.5 kg)  04/12/17 219 lb 3.2 oz (99.4 kg)  04/05/17 224 lb 13.9 oz (102 kg)    ASSESSMENT AND PLAN: 1.  CAD, native vessel: residual chest pain but constant nature clearly not anginal.   2. Dizziness/weakness: orthostatic vital signs checked today and noted to be normal. Recent evaluation in [place] with labs reviewed and no significant abnormalities (renal fxn, electrolytes, CBC, troponin are all normal). EKG unchanged with T wave changes consistent with his recent inferior infarct. Suspect medication-related.  3. Hyperlipidemia: statin intolerant. Should consider PCSK9 if he is willing to undergo Lipid Clinic  referral.  4. Shortness of breath/fatigue: again, recent evaluation with no major abnormalities noted. Exam unremarkable. Unclear etiology.  I think the most likely issue is medication side effect. Suspect brilinta is causing his shortness of breath and dizziness. Recommend change to clopidogrel - DC brilinta immediately. Will see back next week and consider stopping his beta-blocker if no improvement in fatigue. Pursue lipid clinic referral next depending on how he is progressing. Consider echo if continued shortness of breath, but he had an uncomplicated inferior MI with preserved LV function and exam is unrevealing.   Current medicines are reviewed with the patient today.  The patient does not have concerns regarding  medicines.  Labs/ tests ordered today include:   Orders Placed This Encounter  Procedures  . EKG 12-Lead    Disposition:   FU next week as scheduled  Signed, Sherren Mocha, MD  05/02/2017 6:46 AM    [facility] [place], [place], [place]  [address] Phone: [phone]; Fax: [phone]

## 2017-05-02 ENCOUNTER — Encounter (HOSPITAL_COMMUNITY): Payer: Self-pay

## 2017-05-02 ENCOUNTER — Telehealth (HOSPITAL_COMMUNITY): Payer: Self-pay

## 2017-05-02 NOTE — Telephone Encounter (Signed)
I called and left message on patient voicemail to call office about scheduling for cardiac rehab. I left office contact information on patient voicemail to return call. I have mailed patient a letter with information about cardiac rehab.

## 2017-05-08 ENCOUNTER — Telehealth (HOSPITAL_COMMUNITY): Payer: Self-pay

## 2017-05-08 DIAGNOSIS — Z0000 Encounter for general adult medical examination without abnormal findings: Secondary | ICD-10-CM | POA: Diagnosis not present

## 2017-05-08 DIAGNOSIS — N401 Enlarged prostate with lower urinary tract symptoms: Secondary | ICD-10-CM | POA: Diagnosis not present

## 2017-05-08 DIAGNOSIS — Z125 Encounter for screening for malignant neoplasm of prostate: Secondary | ICD-10-CM | POA: Diagnosis not present

## 2017-05-08 DIAGNOSIS — Z8739 Personal history of other diseases of the musculoskeletal system and connective tissue: Secondary | ICD-10-CM | POA: Diagnosis not present

## 2017-05-08 DIAGNOSIS — R7301 Impaired fasting glucose: Secondary | ICD-10-CM | POA: Diagnosis not present

## 2017-05-08 DIAGNOSIS — E669 Obesity, unspecified: Secondary | ICD-10-CM | POA: Diagnosis not present

## 2017-05-08 DIAGNOSIS — I2510 Atherosclerotic heart disease of native coronary artery without angina pectoris: Secondary | ICD-10-CM | POA: Diagnosis not present

## 2017-05-08 DIAGNOSIS — G4733 Obstructive sleep apnea (adult) (pediatric): Secondary | ICD-10-CM | POA: Diagnosis not present

## 2017-05-08 DIAGNOSIS — Z79899 Other long term (current) drug therapy: Secondary | ICD-10-CM | POA: Diagnosis not present

## 2017-05-08 DIAGNOSIS — K760 Fatty (change of) liver, not elsewhere classified: Secondary | ICD-10-CM | POA: Diagnosis not present

## 2017-05-08 NOTE — Telephone Encounter (Signed)
Patient has been called 2X and letter sent about scheduling for cardiac rehab. Patient has not responded to phone calls and letter sent. Referral closed.

## 2017-05-09 ENCOUNTER — Encounter: Payer: Self-pay | Admitting: Cardiovascular Disease

## 2017-05-09 ENCOUNTER — Ambulatory Visit (INDEPENDENT_AMBULATORY_CARE_PROVIDER_SITE_OTHER): Payer: Medicare HMO | Admitting: Cardiovascular Disease

## 2017-05-09 VITALS — BP 120/68 | HR 73 | Ht 66.0 in | Wt 221.2 lb

## 2017-05-09 DIAGNOSIS — Z955 Presence of coronary angioplasty implant and graft: Secondary | ICD-10-CM | POA: Diagnosis not present

## 2017-05-09 DIAGNOSIS — I2111 ST elevation (STEMI) myocardial infarction involving right coronary artery: Secondary | ICD-10-CM | POA: Diagnosis not present

## 2017-05-09 DIAGNOSIS — R0602 Shortness of breath: Secondary | ICD-10-CM

## 2017-05-09 MED ORDER — METOPROLOL SUCCINATE ER 25 MG PO TB24: 12.5000 mg | ORAL_TABLET | Freq: Every day | ORAL | 11 refills | Status: DC

## 2017-05-09 NOTE — Progress Notes (Signed)
Cardiology Office Note Date:  05/10/2017   ID:  [name], [name] 1941/04/15, MRN [id number]  PCP:  Hulan Fess, MD  Cardiologist:  Sherren Mocha, MD    Chief Complaint  Patient presents with  . Follow-up     History of Present Illness: Timothy Wilcox is a 76 y.o. male who presents for follow-up evaluation. 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 He was just seen 04/30/2017 with shortness of breath, weakness, and dizziness. He also was evaluated in emergency department when he was out of town for the same symptoms. After review of all testing, I felt his symptoms were related to brilinta side effects. He was changed to generic clopidogrel and he is markedly improved today. Last week he came in in a wheelchair because of such severe shortness of breath and dizziness. States his symptoms took about 3 days to improve significantly. He still complains of shortness of breath with activity and generalized fatigue. His chest pain has resolved. Slight dizziness persists but is markedly improved.   Past Medical History:  Diagnosis Date  . 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 Arthritis   . Gout   . Hyperlipemia   . IBS (irritable bowel syndrome)   . Renal disorder    kidney stones  . Seasonal allergies   . Sinus congestion    chronic  . Sleep apnea    uses a c-pap    Past Surgical History:  Procedure Laterality Date  . COLONOSCOPY    . CORONARY STENT INTERVENTION N/A 04/03/2017   Procedure: Coronary Stent Intervention;  Surgeon: Sherren Mocha, MD;  Location: [facility];  Service: Cardiovascular;  Laterality: N/A;  . INCISION AND DRAINAGE  2011   infected finger  . LEFT HEART CATH AND CORONARY ANGIOGRAPHY N/A 04/03/2017   Procedure: Left Heart Cath and Coronary Angiography;  Surgeon: Sherren Mocha, MD;  Location: [facility];  Service: Cardiovascular;   Laterality: N/A;  . SHOULDER ARTHROSCOPY WITH ROTATOR CUFF REPAIR AND SUBACROMIAL DECOMPRESSION Right 01/29/2013   Procedure: RIGHT SHOULDER ARTHROSCOPY WITH SUBACROMIAL DECOMPRESSION, THREE TENDON ROTATOR CUFF REPAIR;  Surgeon: Cammie Sickle., MD;  Location: [facility];  Service: Orthopedics;  Laterality: Right;  . TOE DEBRIDEMENT     rt toe cyst    Current Outpatient Prescriptions  Medication Sig Dispense Refill  . allopurinol (ZYLOPRIM) 300 MG tablet Take 300 mg by mouth daily.    Marland Kitchen aspirin EC 81 MG tablet Take 81 mg by mouth daily.    . clopidogrel (PLAVIX) 75 MG tablet Take 1 tablet (75 mg total) by mouth daily. 90 tablet 3  . fluticasone (FLONASE) 50 MCG/ACT nasal spray Place 1 spray into both nostrils 2 (two) times daily.    Marland Kitchen ibuprofen (ADVIL,MOTRIN) 200 MG tablet Take 400 mg by mouth every 6 (six) hours as needed for headache or moderate pain.     Marland Kitchen loratadine (CLARITIN) 10 MG tablet Take 10 mg by mouth 2 (two) times daily.     . metoprolol succinate (TOPROL XL) 25 MG 24 hr tablet Take 0.5 tablets (12.5 mg total) by mouth daily. 30 tablet 11  . Multiple Vitamin (MULTIVITAMIN WITH MINERALS) TABS Take 1 tablet by mouth daily.    . nitroGLYCERIN (NITROSTAT) 0.4 MG SL tablet Place 1 tablet (0.4 mg total) under the tongue every 5 (five) minutes x 3 doses as needed for chest pain. 25 tablet  12  . sildenafil (VIAGRA) 100 MG tablet Take 100 mg by mouth daily as needed for erectile dysfunction.     . tamsulosin (FLOMAX) 0.4 MG CAPS Take 0.4 mg by mouth as needed (used if her has a kidney stone).     No current facility-administered medications for this visit.     Allergies:   Statins; Dicyclomine; and Tagamet [cimetidine]   Social History:  The patient  reports that he has never smoked. He has never used smokeless tobacco. He reports that he drinks alcohol. He reports that he does not use drugs.   Family History:  The patient's family history is not on file.    ROS:   Please see the history of present illness.   All other systems are reviewed and negative.    PHYSICAL EXAM: VS:  BP 120/68   Pulse 73   Ht 5\' 6"  (1.676 m)   Wt 221 lb 3.2 oz (100.3 kg)   SpO2 96%   BMI 35.70 kg/m  , BMI Body mass index is 35.7 kg/m. GEN: Well nourished, well developed, in no acute distress  HEENT: normal  Neck: no JVD, no masses. No carotid bruits Cardiac: RRR without murmur or gallop                Respiratory:  clear to auscultation bilaterally, normal work of breathing GI: soft, nontender, nondistended, + BS MS: no deformity or atrophy  Ext: no pretibial edema, pedal pulses 2+= bilaterally Skin: warm and dry, no rash Neuro:  Strength and sensation are intact Psych: euthymic mood, full affect  EKG:  EKG is not ordered today.  Recent Labs: 04/03/2017: ALT 38 04/04/2017: BUN 16; Creatinine, Ser 1.01; Hemoglobin 12.6; Platelets 185; Potassium 4.0; Sodium 138   Lipid Panel     Component Value Date/Time   CHOL 157 04/04/2017 0008   TRIG 302 (H) 04/04/2017 0008   HDL 39 (L) 04/04/2017 0008   CHOLHDL 4.0 04/04/2017 0008   VLDL 60 (H) 04/04/2017 0008   LDLCALC 58 04/04/2017 0008      Wt Readings from Last 3 Encounters:  05/09/17 221 lb 3.2 oz (100.3 kg)  04/30/17 215 lb (97.5 kg)  04/12/17 219 lb 3.2 oz (99.4 kg)     ASSESSMENT AND PLAN: 1.  CAD, native vessel, without angina: The patient is better after changing his medication and discontinuing brilinta. Will continue aspirin and clopidogrel for at least one year. Because of continued fatigue will decrease metoprolol succinate 12.5 mg daily. Encouraged him to begin phase II cardiac rehabilitation.  2. Exertional dyspnea: Suspect deconditioning is the primary issue. However, he has not had an echocardiogram since the time of his MI. Will check an echo to evaluate LV function and exclude any valvular problems.  3. Hyperlipidemia: Lipids are reviewed and his LDL was quite low but this was checked at the  time of his myocardial infarction when the LDL can be falsely low. I requested that he have updated lipids. Lipids reviewed from his hospitalization in [place] (scanned into Epic) and his cholesterol is 195, HDL 42, LDL 94, trig 295). Will refer him to [facility] as he may qualify for a PCSK9 inhibitor consider his high risk profile with recent MI.   Current medicines are reviewed with the patient today.  The patient does not have concerns regarding medicines.  Labs/ tests ordered today include:   Orders Placed This Encounter  Procedures  . AMB referral to cardiac rehabilitation  . ECHOCARDIOGRAM COMPLETE  Disposition:   FU 3-4 months with an APP  Signed, Sherren Mocha, MD  05/10/2017 6:44 AM    [facility] [place], [place], [place]  [address] Phone: [phone]; Fax: [phone]

## 2017-05-09 NOTE — Patient Instructions (Addendum)
Medication Instructions:  Your physician has recommended you make the following change in your medication:  1. DECREASE Metoprolol Succinate to 25mg  take one-half tablet by mouth daily  Labwork: No new orders.  Please have a lipid panel checked by Dr Rex Kras  Testing/Procedures: Your physician has requested that you have an echocardiogram. Echocardiography is a painless test that uses sound waves to create images of your heart. It provides your doctor with information about the size and shape of your heart and how well your heart's chambers and valves are working. This procedure takes approximately one hour. There are no restrictions for this procedure.  Follow-Up: Your physician recommends that you schedule a follow-up appointment in: 3 MONTHS with PA/NP  You have been referred to Phase 2 Cardiac Rehabilitation.     Any Other Special Instructions Will Be Listed Below (If Applicable).     If you need a refill on your cardiac medications before your next appointment, please call your pharmacy.

## 2017-05-13 ENCOUNTER — Other Ambulatory Visit: Payer: Self-pay

## 2017-05-13 ENCOUNTER — Telehealth (HOSPITAL_COMMUNITY): Payer: Self-pay | Admitting: Pharmacist

## 2017-05-13 DIAGNOSIS — E785 Hyperlipidemia, unspecified: Secondary | ICD-10-CM

## 2017-05-13 DIAGNOSIS — I2119 ST elevation (STEMI) myocardial infarction involving other coronary artery of inferior wall: Secondary | ICD-10-CM

## 2017-05-17 ENCOUNTER — Telehealth (HOSPITAL_COMMUNITY): Payer: Self-pay | Admitting: Pharmacist

## 2017-05-17 NOTE — Telephone Encounter (Signed)
Cardiac Rehab Medication Review by a Pharmacist  Does the patient  feel that his/her medications are working for him/her?  yes  Has the patient been experiencing any side effects to the medications prescribed?  no  Does the patient measure his/her own blood pressure or blood glucose at home?  no   Does the patient have any problems obtaining medications due to transportation or finances?   no  Understanding of regimen: good Understanding of indications: good Potential of compliance: good   Pharmacist comments: 34 yoM presenting for cardiac rehab orientation. He keeps a list of his current medications and was able to provide a complete list. Of note, he was recently admitted to the hospital for an adverse reaction to Brilinta. Added to allergy list.    Mila Merry. Gerarda Fraction, PharmD PGY1 Pharmacy Resident Pager: [phone]  05/17/2017 12:32 PM

## 2017-05-20 ENCOUNTER — Telehealth: Payer: Self-pay | Admitting: *Deleted

## 2017-05-20 NOTE — Telephone Encounter (Signed)
Lmtcb to reschedule appt 08/19/17 with Richardson Dopp, PAC. Provider will be out of the office and we will need to reschedule pt's appt.

## 2017-05-22 ENCOUNTER — Ambulatory Visit (INDEPENDENT_AMBULATORY_CARE_PROVIDER_SITE_OTHER): Payer: Medicare HMO | Admitting: Pharmacist

## 2017-05-22 ENCOUNTER — Telehealth (HOSPITAL_COMMUNITY): Payer: Self-pay

## 2017-05-22 ENCOUNTER — Ambulatory Visit (HOSPITAL_COMMUNITY): Payer: Medicare HMO | Attending: Cardiovascular Disease

## 2017-05-22 ENCOUNTER — Encounter: Payer: Self-pay | Admitting: Pharmacist

## 2017-05-22 ENCOUNTER — Other Ambulatory Visit: Payer: Self-pay

## 2017-05-22 DIAGNOSIS — E785 Hyperlipidemia, unspecified: Secondary | ICD-10-CM | POA: Diagnosis not present

## 2017-05-22 DIAGNOSIS — I2111 ST elevation (STEMI) myocardial infarction involving right coronary artery: Secondary | ICD-10-CM | POA: Insufficient documentation

## 2017-05-22 DIAGNOSIS — R0602 Shortness of breath: Secondary | ICD-10-CM

## 2017-05-22 DIAGNOSIS — Z955 Presence of coronary angioplasty implant and graft: Secondary | ICD-10-CM | POA: Diagnosis not present

## 2017-05-22 NOTE — Telephone Encounter (Signed)
*  Updated insurance benefits* [employer] - $45.00 co-pay, no deductible, out of pocket $4500/$1189.31 has been met, no co-insurance and no pre-authorization. Passport/reference [id number].

## 2017-05-22 NOTE — Patient Instructions (Signed)
We will follow up with lipid panel in 8 weeks. If LDL still elevated we will consider one of the agents (Zetia, Crestor (statin medicaiton) or Injectable therapy).    Cholesterol Cholesterol is a white, waxy, fat-like substance that is needed by the human body in small amounts. The liver makes all the cholesterol we need. Cholesterol is carried from the liver by the blood through the blood vessels. Deposits of cholesterol (plaques) may build up on blood vessel (artery) walls. Plaques make the arteries narrower and stiffer. Cholesterol plaques increase the risk for heart attack and stroke. You cannot feel your cholesterol level even if it is very high. The only way to know that it is high is to have a blood test. Once you know your cholesterol levels, you should keep a record of the test results. Work with your health care provider to keep your levels in the desired range. What do the results mean?  Total cholesterol is a rough measure of all the cholesterol in your blood.  LDL (low-density lipoprotein) is the "bad" cholesterol. This is the type that causes plaque to build up on the artery walls. You want this level to be low.  HDL (high-density lipoprotein) is the "good" cholesterol because it cleans the arteries and carries the LDL away. You want this level to be high.  Triglycerides are fat that the body can either burn for energy or store. High levels are closely linked to heart disease. What are the desired levels of cholesterol?  Total cholesterol below 200.  LDL below 100 for people who are at risk, below 70 for people at very high risk.  HDL above 40 is good. A level of 60 or higher is considered to be protective against heart disease.  Triglycerides below 150. How can I lower my cholesterol? Diet Follow your diet program as told by your health care provider.  Choose fish or white meat chicken and [place], roasted or baked. Limit fatty cuts of red meat, fried foods, and processed  meats, such as sausage and lunch meats.  Eat lots of fresh fruits and vegetables.  Choose whole grains, beans, pasta, potatoes, and cereals.  Choose olive oil, corn oil, or canola oil, and use only small amounts.  Avoid butter, mayonnaise, shortening, or palm kernel oils.  Avoid foods with trans fats.  Drink skim or nonfat milk and eat low-fat or nonfat yogurt and cheeses. Avoid whole milk, cream, ice cream, egg yolks, and full-fat cheeses.  Healthier desserts include angel food cake, ginger snaps, animal crackers, hard candy, popsicles, and low-fat or nonfat frozen yogurt. Avoid pastries, cakes, pies, and cookies.  Exercise  Follow your exercise program as told by your health care provider. A regular program: ? Helps to decrease LDL and raise HDL. ? Helps with weight control.  Do things that increase your activity level, such as gardening, walking, and taking the stairs.  Ask your health care provider about ways that you can be more active in your daily life.  Medicine  Take over-the-counter and prescription medicines only as told by your health care provider. ? Medicine may be prescribed by your health care provider to help lower cholesterol and decrease the risk for heart disease. This is usually done if diet and exercise have failed to bring down cholesterol levels. ? If you have several risk factors, you may need medicine even if your levels are normal.  This information is not intended to replace advice given to you by your health care provider. Make  sure you discuss any questions you have with your health care provider. Document Released: 06/19/2001 Document Revised: 04/21/2016 Document Reviewed: 03/24/2016 Elsevier Interactive Patient Education  2017 [employer].

## 2017-05-22 NOTE — Progress Notes (Signed)
Patient ID: Timothy Wilcox                 DOB: [birth year]                    MRN: [id number]     HPI: Timothy Wilcox is a 76 y.o. male patient of Dr. Burt Knack that presents today for lipid evaluation.  PMH includes CAD, STEMI in June 2018, HLD. He was recently seen by Dr. Burt Knack for follow up. He has had labs (see below) about 1 month post-event that reveal elevated LDL and TG.   He presents today with questions about cholesterol management. He states he has only tried simvastatin previously and his experience was so bad that it would take a lot of convincing to have him restart a statin medication.   He has questions about diet and the majority of the conversation today was spent on diet (>30 minutes). He has been following a low carb diet and he was previously told this would help both his cholesterol and weight. He states he has lost several pounds with this diet but currently is maintaining. He reports that he also drinks an entire bottle of wine about 5 nights out of the week.   Risk Factors: STEMI 03/2017  LDL Goal: <70, TG <150  Current Medications: none  Intolerances: Lovaza - stopped due to IBS diarrhea decreased after discontinuation of Lovaza, simvastatin for 10 days and was unable to even sit without pain (pain in joints, can't move knees) - once stopped symptoms improved, but took 6 months to fully recover   Diet: Has been on low carb diet for several years now. Has been eating a lot of meat (pork, bacon, eggs, and sausage). Prepares meats baked and fried. He is not good about eating vegetables. Does not eat deep fried food. Avoids french fries.   Exercise: He stretches more than actually exercising. He will start cardiac rehab tomorrow.   Family History: No known family history of cardiovascular disease.   Social History: The patient  reports that he has never smoked. He has never used smokeless tobacco. He reports that he drinks alcohol. He drinks about 5-6 days per week  and drinks a whole bottle of wine. He used to drink a lot of beer but has decreased that over the last few years. He reports that he does not use drugs.   Labs: From [place] 04/27/17 - TC 195, HDL 42, LDL 94, TG 295 - no cholesterol lowering medications  Past Medical History:  Diagnosis Date  . 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 Arthritis   . Gout   . Hyperlipemia   . IBS (irritable bowel syndrome)   . Renal disorder    kidney stones  . Seasonal allergies   . Sinus congestion    chronic  . Sleep apnea    uses a c-pap    Current Outpatient Prescriptions on File Prior to Visit  Medication Sig Dispense Refill  . allopurinol (ZYLOPRIM) 300 MG tablet Take 300 mg by mouth daily.    Marland Kitchen aspirin EC 81 MG tablet Take 81 mg by mouth daily.    . clopidogrel (PLAVIX) 75 MG tablet Take 1 tablet (75 mg total) by mouth daily. 90 tablet 3  . fluticasone (FLONASE) 50 MCG/ACT nasal spray Place 1 spray into both nostrils 2 (two) times daily.    Marland Kitchen ibuprofen (ADVIL,MOTRIN) 200 MG tablet Take 400 mg by mouth every 6 (six) hours as needed  for headache or moderate pain.     Marland Kitchen loratadine (CLARITIN) 10 MG tablet Take 10 mg by mouth 2 (two) times daily.     . metoprolol succinate (TOPROL XL) 25 MG 24 hr tablet Take 0.5 tablets (12.5 mg total) by mouth daily. 30 tablet 11  . Multiple Vitamin (MULTIVITAMIN WITH MINERALS) TABS Take 1 tablet by mouth daily.    . nitroGLYCERIN (NITROSTAT) 0.4 MG SL tablet Place 1 tablet (0.4 mg total) under the tongue every 5 (five) minutes x 3 doses as needed for chest pain. 25 tablet 12  . Probiotic Product (DAILY PROBIOTIC PO) Take 1 tablet by mouth daily.    . sildenafil (VIAGRA) 100 MG tablet Take 100 mg by mouth daily as needed for erectile dysfunction.     . tamsulosin (FLOMAX) 0.4 MG CAPS Take 0.4 mg by mouth as needed (used if her has a kidney stone).     No current facility-administered medications on file prior to visit.     Allergies  Allergen  Reactions  . Brilinta [Ticagrelor] Shortness Of Breath    Dizziness, weakness. Caused hospital admission.  . Statins Other (See Comments)    Pain in joints, cant move knees  . Dicyclomine Other (See Comments)    Interfered with sleep,nervous  . Tagamet [Cimetidine] Other (See Comments)    Gynecomastia     Assessment/Plan:  Hyperlipidemia: Pt's LDL is above his goal of <70 mg/dL. Educated pt on the safety and cardiovascular benefits of PCSK9i, ezetimibe, and statin therapy. Also discussed various lifestyle modifications that can help lower cholesterol levels. Pt has only been on simvastatin in the past and will likely not be approved for PCSK9 because he has not tried a high intensity statin. Recommended he try crestor 5 mg dosed once weekly, but pt was very hesitant due to the severe joint pain he experienced with simvastatin. Also discussed Zetia monotherapy (despite no evidence for monotherapy) and pt declined.  Based on pt's preference, will have him complete cardiac rehab and make lifestyle modifications. Recheck lipid panel in 2 months. If LDL value is not at goal, will discuss Crestor vs. Zetia as will need to try additional statin medication prior to PCSK9i.   -Barkley Boards, PharmD Student  Thank you,  Lelan Pons. Patterson Hammersmith, [facility]  05/22/2017 7:53 AM

## 2017-05-23 ENCOUNTER — Encounter (HOSPITAL_COMMUNITY): Payer: Self-pay

## 2017-05-23 ENCOUNTER — Encounter (HOSPITAL_COMMUNITY)
Admission: RE | Admit: 2017-05-23 | Discharge: 2017-05-23 | Disposition: A | Discharge status: Home / Self Care | Payer: Medicare HMO | Source: Ambulatory Visit | Attending: Cardiovascular Disease | Admitting: Cardiovascular Disease

## 2017-05-23 VITALS — BP 124/68 | HR 75 | Ht 67.0 in | Wt 229.3 lb

## 2017-05-23 DIAGNOSIS — I2111 ST elevation (STEMI) myocardial infarction involving right coronary artery: Secondary | ICD-10-CM | POA: Insufficient documentation

## 2017-05-23 DIAGNOSIS — Z955 Presence of coronary angioplasty implant and graft: Secondary | ICD-10-CM

## 2017-05-23 DIAGNOSIS — I213 ST elevation (STEMI) myocardial infarction of unspecified site: Secondary | ICD-10-CM | POA: Diagnosis present

## 2017-05-23 NOTE — Progress Notes (Signed)
Cardiac Individual Treatment Plan  Patient Details  Name: Timothy Wilcox MRN: [id number] Date of Birth: [birth year] Referring Provider:     CARDIAC REHAB PHASE II ORIENTATION from 05/23/2017 in [place]  Referring Provider  Sherren Mocha MD      Initial Encounter Date:    CARDIAC REHAB PHASE II ORIENTATION from 05/23/2017 in [place]  Date  05/23/17  Referring Provider  Sherren Mocha MD      Visit Diagnosis: 04/03/17 ST elevation myocardial infarction involving right coronary artery ([place])  04/03/17 Status post coronary artery stent placement  Patient's Home Medications on Admission:  Current Outpatient Prescriptions:  .  allopurinol (ZYLOPRIM) 300 MG tablet, Take 300 mg by mouth daily., Disp: , Rfl:  .  aspirin EC 81 MG tablet, Take 81 mg by mouth daily., Disp: , Rfl:  .  clopidogrel (PLAVIX) 75 MG tablet, Take 1 tablet (75 mg total) by mouth daily., Disp: 90 tablet, Rfl: 3 .  fluticasone (FLONASE) 50 MCG/ACT nasal spray, Place 1 spray into both nostrils 2 (two) times daily., Disp: , Rfl:  .  ibuprofen (ADVIL,MOTRIN) 200 MG tablet, Take 400 mg by mouth every 6 (six) hours as needed for headache or moderate pain. , Disp: , Rfl:  .  loratadine (CLARITIN) 10 MG tablet, Take 10 mg by mouth 2 (two) times daily. , Disp: , Rfl:  .  metoprolol succinate (TOPROL XL) 25 MG 24 hr tablet, Take 0.5 tablets (12.5 mg total) by mouth daily., Disp: 30 tablet, Rfl: 11 .  Multiple Vitamin (MULTIVITAMIN WITH MINERALS) TABS, Take 1 tablet by mouth daily., Disp: , Rfl:  .  nitroGLYCERIN (NITROSTAT) 0.4 MG SL tablet, Place 1 tablet (0.4 mg total) under the tongue every 5 (five) minutes x 3 doses as needed for chest pain., Disp: 25 tablet, Rfl: 12 .  Probiotic Product (DAILY PROBIOTIC PO), Take 1 tablet by mouth daily., Disp: , Rfl:  .  sildenafil (VIAGRA) 100 MG tablet, Take 100 mg by mouth daily as needed for erectile dysfunction. ,  Disp: , Rfl:  .  tamsulosin (FLOMAX) 0.4 MG CAPS, Take 0.4 mg by mouth as needed (used if her has a kidney stone)., Disp: , Rfl:   Past Medical History: Past Medical History:  Diagnosis Date  . 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 Arthritis   . Gout   . Hyperlipemia   . IBS (irritable bowel syndrome)   . Renal disorder    kidney stones  . Seasonal allergies   . Sinus congestion    chronic  . Sleep apnea    uses a c-pap    Tobacco Use: History  Smoking Status  . Never Smoker  Smokeless Tobacco  . Never Used    Labs: Recent Review Flowsheet Data    Labs for ITP Cardiac and Pulmonary Rehab Latest Ref Rng & Units 03/24/2012 04/03/2017 04/04/2017   Cholestrol 0 - 200 mg/dL - 192 157   LDLCALC 0 - 99 mg/dL - 89 58   HDL >40 mg/dL - 46 39(L)   Trlycerides <150 mg/dL - 287(H) 302(H)   Hemoglobin A1c 4.8 - 5.6 % - 5.6 -   TCO2 0 - 100 mmol/L 22 19 -      Capillary Blood Glucose: No results found for: GLUCAP   Exercise Target Goals: Date: 05/23/17  Exercise Program Goal: Individual exercise prescription set with THRR, safety & activity barriers. Participant demonstrates ability to understand and report RPE  using BORG scale, to self-measure pulse accurately, and to acknowledge the importance of the exercise prescription.  Exercise Prescription Goal: Starting with aerobic activity 30 plus minutes a day, 3 days per week for initial exercise prescription. Provide home exercise prescription and guidelines that participant acknowledges understanding prior to discharge.  Activity Barriers & Risk Stratification:     Activity Barriers & Cardiac Risk Stratification - 05/23/17 1415      Activity Barriers & Cardiac Risk Stratification   Activity Barriers Other (comment);Deconditioning;Back Problems;Arthritis   Comments L ankle stiffness (achilles tendon tear)   Cardiac Risk Stratification High      6 Minute Walk:     6 Minute Walk    Row Name  05/23/17 1511         6 Minute Walk   Phase Initial     Distance 1306 feet     Walk Time 6 minutes     # of Rest Breaks 0     MPH 2.47     METS 2.2     RPE 11     VO2 Peak 7.7     Symptoms Yes (comment)     Comments low back fatigue!     Resting HR 75 bpm     Resting BP 124/68     Max Ex. HR 93 bpm     Max Ex. BP 122/90     2 Minute Post BP 122/64        Oxygen Initial Assessment:   Oxygen Re-Evaluation:   Oxygen Discharge (Final Oxygen Re-Evaluation):   Initial Exercise Prescription:     Initial Exercise Prescription - 05/23/17 1500      Date of Initial Exercise RX and Referring Provider   Date 05/23/17   Referring Provider Sherren Mocha MD     Bike   Level 0.5   Minutes 10   METs 1.94     NuStep   Level 3   SPM 70   Minutes 10   METs 2     Track   Laps 8   Minutes 10   METs 2.39     Prescription Details   Frequency (times per week) 3   Duration Progress to 30 minutes of continuous aerobic without signs/symptoms of physical distress     Intensity   THRR 40-80% of Max Heartrate 59-118   Ratings of Perceived Exertion 11-15   Perceived Dyspnea 0-4     Progression   Progression Continue to progress workloads to maintain intensity without signs/symptoms of physical distress.     Resistance Training   Training Prescription Yes   Weight 2lbs   Reps 10-15      Perform Capillary Blood Glucose checks as needed.  Exercise Prescription Changes:   Exercise Comments:   Exercise Goals and Review:     Exercise Goals    Row Name 05/23/17 1416             Exercise Goals   Increase Physical Activity Yes       Intervention Provide advice, education, support and counseling about physical activity/exercise needs.;Develop an individualized exercise prescription for aerobic and resistive training based on initial evaluation findings, risk stratification, comorbidities and participant's personal goals.       Expected Outcomes Achievement of  increased cardiorespiratory fitness and enhanced flexibility, muscular endurance and strength shown through measurements of functional capacity and personal statement of participant.       Increase Strength and Stamina Yes  improve mobility in ankles and low back  Intervention Provide advice, education, support and counseling about physical activity/exercise needs.;Develop an individualized exercise prescription for aerobic and resistive training based on initial evaluation findings, risk stratification, comorbidities and participant's personal goals.       Expected Outcomes Achievement of increased cardiorespiratory fitness and enhanced flexibility, muscular endurance and strength shown through measurements of functional capacity and personal statement of participant.          Exercise Goals Re-Evaluation :    Discharge Exercise Prescription (Final Exercise Prescription Changes):   Nutrition:  Target Goals: Understanding of nutrition guidelines, daily intake of sodium 1500mg , cholesterol 200mg , calories 30% from fat and 7% or less from saturated fats, daily to have 5 or more servings of fruits and vegetables.  Biometrics:     Pre Biometrics - 05/23/17 1517      Pre Biometrics   Waist Circumference 43 inches   Hip Circumference 47 inches   Waist to Hip Ratio 0.91 %   Triceps Skinfold 25 mm   % Body Fat 34 %   Grip Strength 31 kg   Flexibility 8 in   Single Leg Stand 14.31 seconds       Nutrition Therapy Plan and Nutrition Goals:   Nutrition Discharge: Nutrition Scores:   Nutrition Goals Re-Evaluation:   Nutrition Goals Re-Evaluation:   Nutrition Goals Discharge (Final Nutrition Goals Re-Evaluation):   Psychosocial: Target Goals: Acknowledge presence or absence of significant depression and/or stress, maximize coping skills, provide positive support system. Participant is able to verbalize types and ability to use techniques and skills needed for reducing  stress and depression.  Initial Review & Psychosocial Screening:     Initial Psych Review & Screening - 05/23/17 1609      Initial Review   Current issues with Current Stress Concerns   Source of Stress Concerns Family   Comments daughter has cancer and is receiving chemotherapy     Family Dynamics   Good Support System? Yes  Married for 53 years     Barriers   Psychosocial barriers to participate in program The patient should benefit from training in stress management and relaxation.     Screening Interventions   Interventions Encouraged to exercise      Quality of Life Scores:     Quality of Life - 05/23/17 1524      Quality of Life Scores   Health/Function Pre 22.83 %   Socioeconomic Pre 23.13 %   Psych/Spiritual Pre 22.75 %   Family Pre 21.5 %   GLOBAL Pre 22.69 %      PHQ-9: Recent Review Flowsheet Data    There is no flowsheet data to display.     Interpretation of Total Score  Total Score Depression Severity:  1-4 = Minimal depression, 5-9 = Mild depression, 10-14 = Moderate depression, 15-19 = Moderately severe depression, 20-27 = Severe depression   Psychosocial Evaluation and Intervention:   Psychosocial Re-Evaluation:   Psychosocial Discharge (Final Psychosocial Re-Evaluation):   Vocational Rehabilitation: Provide vocational rehab assistance to qualifying candidates.   Vocational Rehab Evaluation & Intervention:     Vocational Rehab - 05/23/17 1622      Initial Vocational Rehab Evaluation & Intervention   Assessment shows need for Vocational Rehabilitation No  pt retired from [employer]: Education Goals: Education classes will be provided on a weekly basis, covering required topics. Participant will state understanding/return demonstration of topics presented.  Learning Barriers/Preferences:     Learning Barriers/Preferences - 05/23/17 1414  Learning Barriers/Preferences   Engineering geologist  Preferences Skilled Demonstration      Education Topics: Count Your Pulse:  -Group instruction provided by verbal instruction, demonstration, patient participation and written materials to support subject.  Instructors address importance of being able to find your pulse and how to count your pulse when at home without a heart monitor.  Patients get hands on experience counting their pulse with staff help and individually.   Heart Attack, Angina, and Risk Factor Modification:  -Group instruction provided by verbal instruction, video, and written materials to support subject.  Instructors address signs and symptoms of angina and heart attacks.    Also discuss risk factors for heart disease and how to make changes to improve heart health risk factors.   Functional Fitness:  -Group instruction provided by verbal instruction, demonstration, patient participation, and written materials to support subject.  Instructors address safety measures for doing things around the house.  Discuss how to get up and down off the floor, how to pick things up properly, how to safely get out of a chair without assistance, and balance training.   Meditation and Mindfulness:  -Group instruction provided by verbal instruction, patient participation, and written materials to support subject.  Instructor addresses importance of mindfulness and meditation practice to help reduce stress and improve awareness.  Instructor also leads participants through a meditation exercise.    Stretching for Flexibility and Mobility:  -Group instruction provided by verbal instruction, patient participation, and written materials to support subject.  Instructors lead participants through series of stretches that are designed to increase flexibility thus improving mobility.  These stretches are additional exercise for major muscle groups that are typically performed during regular warm up and cool down.   Hands Only CPR:  -Group verbal,  video, and participation provides a basic overview of AHA guidelines for community CPR. Role-play of emergencies allow participants the opportunity to practice calling for help and chest compression technique with discussion of AED use.   Hypertension: -Group verbal and written instruction that provides a basic overview of hypertension including the most recent diagnostic guidelines, risk factor reduction with self-care instructions and medication management.    Nutrition I class: Heart Healthy Eating:  -Group instruction provided by PowerPoint slides, verbal discussion, and written materials to support subject matter. The instructor gives an explanation and review of the Therapeutic Lifestyle Changes diet recommendations, which includes a discussion on lipid goals, dietary fat, sodium, fiber, plant stanol/sterol esters, sugar, and the components of a well-balanced, healthy diet.   Nutrition II class: Lifestyle Skills:  -Group instruction provided by PowerPoint slides, verbal discussion, and written materials to support subject matter. The instructor gives an explanation and review of label reading, grocery shopping for heart health, heart healthy recipe modifications, and ways to make healthier choices when eating out.   Diabetes Question & Answer:  -Group instruction provided by PowerPoint slides, verbal discussion, and written materials to support subject matter. The instructor gives an explanation and review of diabetes co-morbidities, pre- and post-prandial blood glucose goals, pre-exercise blood glucose goals, signs, symptoms, and treatment of hypoglycemia and hyperglycemia, and foot care basics.   Diabetes Blitz:  -Group instruction provided by PowerPoint slides, verbal discussion, and written materials to support subject matter. The instructor gives an explanation and review of the physiology behind type 1 and type 2 diabetes, diabetes medications and rational behind using different  medications, pre- and post-prandial blood glucose recommendations and Hemoglobin A1c goals, diabetes diet, and exercise  including blood glucose guidelines for exercising safely.    Portion Distortion:  -Group instruction provided by PowerPoint slides, verbal discussion, written materials, and food models to support subject matter. The instructor gives an explanation of serving size versus portion size, changes in portions sizes over the last 20 years, and what consists of a serving from each food group.   Stress Management:  -Group instruction provided by verbal instruction, video, and written materials to support subject matter.  Instructors review role of stress in heart disease and how to cope with stress positively.     Exercising on Your Own:  -Group instruction provided by verbal instruction, power point, and written materials to support subject.  Instructors discuss benefits of exercise, components of exercise, frequency and intensity of exercise, and end points for exercise.  Also discuss use of nitroglycerin and activating EMS.  Review options of places to exercise outside of rehab.  Review guidelines for sex with heart disease.   Cardiac Drugs I:  -Group instruction provided by verbal instruction and written materials to support subject.  Instructor reviews cardiac drug classes: antiplatelets, anticoagulants, beta blockers, and statins.  Instructor discusses reasons, side effects, and lifestyle considerations for each drug class.   Cardiac Drugs II:  -Group instruction provided by verbal instruction and written materials to support subject.  Instructor reviews cardiac drug classes: angiotensin converting enzyme inhibitors (ACE-I), angiotensin II receptor blockers (ARBs), nitrates, and calcium channel blockers.  Instructor discusses reasons, side effects, and lifestyle considerations for each drug class.   Anatomy and Physiology of the Circulatory System:  Group verbal and written  instruction and models provide basic cardiac anatomy and physiology, with the coronary electrical and arterial systems. Review of: AMI, Angina, Valve disease, Heart Failure, Peripheral Artery Disease, Cardiac Arrhythmia, Pacemakers, and the ICD.   Other Education:  -Group or individual verbal, written, or video instructions that support the educational goals of the cardiac rehab program.   Knowledge Questionnaire Score:     Knowledge Questionnaire Score - 05/23/17 1510      Knowledge Questionnaire Score   Pre Score 23/24      Core Components/Risk Factors/Patient Goals at Admission:     Personal Goals and Risk Factors at Admission - 05/23/17 1522      Core Components/Risk Factors/Patient Goals on Admission    Weight Management Yes;Obesity;Weight Maintenance;Weight Loss   Intervention Weight Management: Develop a combined nutrition and exercise program designed to reach desired caloric intake, while maintaining appropriate intake of nutrient and fiber, sodium and fats, and appropriate energy expenditure required for the weight goal.;Weight Management: Provide education and appropriate resources to help participant work on and attain dietary goals.;Weight Management/Obesity: Establish reasonable short term and long term weight goals.;Obesity: Provide education and appropriate resources to help participant work on and attain dietary goals.   Expected Outcomes Short Term: Continue to assess and modify interventions until short term weight is achieved;Long Term: Adherence to nutrition and physical activity/exercise program aimed toward attainment of established weight goal;Weight Maintenance: Understanding of the daily nutrition guidelines, which includes 25-35% calories from fat, 7% or less cal from saturated fats, less than 200mg  cholesterol, less than 1.5gm of sodium, & 5 or more servings of fruits and vegetables daily;Weight Loss: Understanding of general recommendations for a balanced deficit  meal plan, which promotes 1-2 lb weight loss per week and includes a negative energy balance of [phone] kcal/d;Understanding recommendations for meals to include 15-35% energy as protein, 25-35% energy from fat, 35-60% energy from carbohydrates, less than 200mg   of dietary cholesterol, 20-35 gm of total fiber daily;Understanding of distribution of calorie intake throughout the day with the consumption of 4-5 meals/snacks   Lipids Yes   Intervention Provide education and support for participant on nutrition & aerobic/resistive exercise along with prescribed medications to achieve LDL 70mg , HDL >40mg .   Expected Outcomes Short Term: Participant states understanding of desired cholesterol values and is compliant with medications prescribed. Participant is following exercise prescription and nutrition guidelines.;Long Term: Cholesterol controlled with medications as prescribed, with individualized exercise RX and with personalized nutrition plan. Value goals: LDL < 70mg , HDL > 40 mg.   Stress Yes   Intervention Offer individual and/or small group education and counseling on adjustment to heart disease, stress management and health-related lifestyle change. Teach and support self-help strategies.;Refer participants experiencing significant psychosocial distress to appropriate mental health specialists for further evaluation and treatment. When possible, include family members and significant others in education/counseling sessions.   Expected Outcomes Short Term: Participant demonstrates changes in health-related behavior, relaxation and other stress management skills, ability to obtain effective social support, and compliance with psychotropic medications if prescribed.;Long Term: Emotional wellbeing is indicated by absence of clinically significant psychosocial distress or social isolation.      Core Components/Risk Factors/Patient Goals Review:    Core Components/Risk Factors/Patient Goals at Discharge  (Final Review):    ITP Comments:     ITP Comments    Row Name 05/23/17 1411           ITP Comments Dr. Fransico Him, Medical Director          Comments:  Patient attended orientation from 1330 to 1445 to review rules and guidelines for program. Completed 6 minute walk test, Intitial ITP, and exercise prescription.  VSS. Telemetry-SR.  Asymptomatic. Brief psychosocial assessment - Pt rates his stress level as medium.  Pt daughter is currently receiving chemotherapy and he worries about her.  Pt is looking forward to participating in cardiac rehab. Cherre Huger, BSN Cardiac and Training and development officer

## 2017-05-29 NOTE — Progress Notes (Signed)
OLEN EAVES 76 y.o. male DOB 01-09-1941 MRN [id number]       Nutrition: Brief Note  1. 04/03/17 ST elevation myocardial infarction involving right coronary artery ([place])   2. 04/03/17 Status post coronary artery stent placement    Past Medical History:  Diagnosis Date  . 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 Arthritis   . Gout   . Hyperlipemia   . IBS (irritable bowel syndrome)   . Renal disorder    kidney stones  . Seasonal allergies   . Sinus congestion    chronic  . Sleep apnea    uses a c-pap   Meds reviewed.   HT: Ht Readings from Last 1 Encounters:  05/23/17 5\' 7"  (1.702 m)    WT: Wt Readings from Last 3 Encounters:  05/23/17 229 lb 4.5 oz (104 kg)  05/09/17 221 lb 3.2 oz (100.3 kg)  04/30/17 215 lb (97.5 kg)     BMI 36   Current tobacco use? No      Labs:  Lipid Panel     Component Value Date/Time   CHOL 157 04/04/2017 0008   TRIG 302 (H) 04/04/2017 0008   HDL 39 (L) 04/04/2017 0008   CHOLHDL 4.0 04/04/2017 0008   VLDL 60 (H) 04/04/2017 0008   LDLCALC 58 04/04/2017 0008    Lab Results  Component Value Date   HGBA1C 5.6 04/03/2017   CBG (last 3)  No results for input(s): GLUCAP in the last 72 hours.  Nutrition Diagnosis ? Food-and nutrition-related knowledge deficit related to lack of exposure to information as related to diagnosis of: ? CVD  ? Obesity related to excessive energy intake as evidenced by a BMI of 36  Nutrition Goal(s):  ? Pt to identify food quantities necessary to achieve weight loss of 6-24 lb (2.7-10.9 kg) at graduation from cardiac rehab. Long-term pt wants to lose 50 lb.   Plan:  Pt to attend nutrition classes ? Nutrition I ? Nutrition II ? Portion Distortion  Will provide client-centered nutrition education as part of interdisciplinary care.   Monitor and evaluate progress toward nutrition goal with team.  Derek Mound, M.Ed, RD, LDN, CDE 05/29/2017 10:12 AM

## 2017-05-31 ENCOUNTER — Encounter (HOSPITAL_COMMUNITY)
Admission: RE | Admit: 2017-05-31 | Discharge: 2017-05-31 | Disposition: A | Discharge status: Home / Self Care | Payer: Medicare HMO | Source: Ambulatory Visit | Attending: Cardiovascular Disease | Admitting: Cardiovascular Disease

## 2017-05-31 DIAGNOSIS — Z955 Presence of coronary angioplasty implant and graft: Secondary | ICD-10-CM | POA: Diagnosis not present

## 2017-05-31 DIAGNOSIS — I2111 ST elevation (STEMI) myocardial infarction involving right coronary artery: Secondary | ICD-10-CM | POA: Diagnosis not present

## 2017-05-31 NOTE — Progress Notes (Signed)
Daily Session Note  Patient Details  Name: Timothy Wilcox MRN: [id number] Date of Birth: Apr 05, 1941 Referring Provider:     [facility] from 05/23/2017 in [place]  Referring Provider  Sherren Mocha MD      Encounter Date: 05/31/2017  Check In:     Session Check In - 05/31/17 1121      Check-In   Location [facility]   Staff Present Luetta Nutting Fair, MS, ACSM RCEP, Exercise Physiologist;Nyeshia Mysliwiec Hopland, RN, BSN;Joann Rion, RN, [facility], MS, ACSM RCEP, Exercise Physiologist   Supervising physician immediately available to respond to emergencies [facility] immediately available   Physician(s) Dr. Clementeen Graham   Medication changes reported     No   Fall or balance concerns reported    No   Tobacco Cessation No Change   Warm-up and Cool-down Performed as group-led instruction   Resistance Training Performed Yes   VAD Patient? No     Pain Assessment   Currently in Pain? No/denies   Multiple Pain Sites No      Capillary Blood Glucose: No results found for this or any previous visit (from the past 24 hour(s)).    History  Smoking Status  . Never Smoker  Smokeless Tobacco  . Never Used    Goals Met:  Exercise tolerated well Personal goals reviewed No report of cardiac concerns or symptoms  Goals Unmet:  Not Applicable  Comments:  Pt started cardiac rehab today.  Pt tolerated light exercise without difficulty. VSS, telemetry-SR, asymptomatic.  Medication list reconciled. Pt denies barriers to medication compliance.  PSYCHOSOCIAL ASSESSMENT:  PHQ-0. Pt completed pre assessment quality of life survey.  Pt scored the following:     Quality of Life - 05/23/17 1524      Quality of Life Scores   Health/Function Pre 22.83 %   Socioeconomic Pre 23.13 %   Psych/Spiritual Pre 22.75 %   Family Pre 21.5 %   GLOBAL Pre 22.69 %        Pt exhibits positive coping skills, hopeful  outlook with supportive family. No psychosocial needs identified at this time, no psychosocial interventions necessary.    Pt enjoys playing golf, sailing and woodworking. Pt desires to develop an exercise routine to increase strength mobility  In ankle and back with long term weight.  Pt oriented to exercise equipment and routine.  Understanding verbalized. Maurice Small RN, BSN Cardiac and Pulmonary Rehab Nurse Navigator                 Dr. Fransico Him is Medical Director for Cardiac Rehab at [facility].

## 2017-06-05 ENCOUNTER — Encounter (HOSPITAL_COMMUNITY)
Admission: RE | Admit: 2017-06-05 | Discharge: 2017-06-05 | Disposition: A | Discharge status: Home / Self Care | Payer: Medicare HMO | Source: Ambulatory Visit | Attending: Cardiovascular Disease | Admitting: Cardiovascular Disease

## 2017-06-05 DIAGNOSIS — Z955 Presence of coronary angioplasty implant and graft: Secondary | ICD-10-CM | POA: Diagnosis not present

## 2017-06-05 DIAGNOSIS — I2111 ST elevation (STEMI) myocardial infarction involving right coronary artery: Secondary | ICD-10-CM

## 2017-06-07 ENCOUNTER — Encounter (HOSPITAL_COMMUNITY)
Admission: RE | Admit: 2017-06-07 | Discharge: 2017-06-07 | Disposition: A | Discharge status: Home / Self Care | Payer: Medicare HMO | Source: Ambulatory Visit | Attending: Cardiovascular Disease | Admitting: Cardiovascular Disease

## 2017-06-07 DIAGNOSIS — Z955 Presence of coronary angioplasty implant and graft: Secondary | ICD-10-CM

## 2017-06-07 DIAGNOSIS — I2111 ST elevation (STEMI) myocardial infarction involving right coronary artery: Secondary | ICD-10-CM | POA: Diagnosis not present

## 2017-06-12 ENCOUNTER — Encounter (HOSPITAL_COMMUNITY)
Admission: RE | Admit: 2017-06-12 | Discharge: 2017-06-12 | Disposition: A | Discharge status: Home / Self Care | Payer: Medicare HMO | Source: Ambulatory Visit | Attending: Cardiovascular Disease | Admitting: Cardiovascular Disease

## 2017-06-12 DIAGNOSIS — I213 ST elevation (STEMI) myocardial infarction of unspecified site: Secondary | ICD-10-CM | POA: Diagnosis present

## 2017-06-12 DIAGNOSIS — I2111 ST elevation (STEMI) myocardial infarction involving right coronary artery: Secondary | ICD-10-CM | POA: Diagnosis not present

## 2017-06-12 DIAGNOSIS — Z955 Presence of coronary angioplasty implant and graft: Secondary | ICD-10-CM | POA: Diagnosis not present

## 2017-06-12 NOTE — Progress Notes (Signed)
Reviewed home exercise with pt today.  Pt plans to ride stationary bike for exercise, 3x/week in addition to cardiac rehab.  Reviewed THR, pulse, RPE, sign and symptoms, NTG use, and when to call 911 or MD.  Also discussed weather considerations and indoor options.  Pt voiced understanding.    [facility] [employer]

## 2017-06-13 DIAGNOSIS — N200 Calculus of kidney: Secondary | ICD-10-CM | POA: Diagnosis not present

## 2017-06-13 DIAGNOSIS — N401 Enlarged prostate with lower urinary tract symptoms: Secondary | ICD-10-CM | POA: Diagnosis not present

## 2017-06-13 DIAGNOSIS — R351 Nocturia: Secondary | ICD-10-CM | POA: Diagnosis not present

## 2017-06-13 DIAGNOSIS — N5201 Erectile dysfunction due to arterial insufficiency: Secondary | ICD-10-CM | POA: Diagnosis not present

## 2017-06-14 ENCOUNTER — Encounter (HOSPITAL_COMMUNITY)
Admission: RE | Admit: 2017-06-14 | Discharge: 2017-06-14 | Disposition: A | Discharge status: Home / Self Care | Payer: Medicare HMO | Source: Ambulatory Visit | Attending: Cardiovascular Disease | Admitting: Cardiovascular Disease

## 2017-06-14 DIAGNOSIS — Z955 Presence of coronary angioplasty implant and graft: Secondary | ICD-10-CM | POA: Diagnosis not present

## 2017-06-14 DIAGNOSIS — I2111 ST elevation (STEMI) myocardial infarction involving right coronary artery: Secondary | ICD-10-CM

## 2017-06-18 NOTE — Progress Notes (Signed)
Cardiac Individual Treatment Plan  Patient Details  Name: MEHMET SCALLY MRN: [id number] Date of Birth: [birth year] Referring Provider:     CARDIAC REHAB PHASE II ORIENTATION from 05/23/2017 in [facility]  Referring Provider  Sherren Mocha MD      Initial Encounter Date:    CARDIAC REHAB PHASE II ORIENTATION from 05/23/2017 in [facility]  Date  05/23/17  Referring Provider  Sherren Mocha MD      Visit Diagnosis: 04/03/17 ST elevation myocardial infarction involving right coronary artery ([place])  04/03/17 Status post coronary artery stent placement  Patient's Home Medications on Admission:  Current Outpatient Prescriptions:  .  allopurinol (ZYLOPRIM) 300 MG tablet, Take 300 mg by mouth daily., Disp: , Rfl:  .  aspirin EC 81 MG tablet, Take 81 mg by mouth daily., Disp: , Rfl:  .  clopidogrel (PLAVIX) 75 MG tablet, Take 1 tablet (75 mg total) by mouth daily., Disp: 90 tablet, Rfl: 3 .  fluticasone (FLONASE) 50 MCG/ACT nasal spray, Place 1 spray into both nostrils 2 (two) times daily., Disp: , Rfl:  .  ibuprofen (ADVIL,MOTRIN) 200 MG tablet, Take 400 mg by mouth every 6 (six) hours as needed for headache or moderate pain. , Disp: , Rfl:  .  loratadine (CLARITIN) 10 MG tablet, Take 10 mg by mouth 2 (two) times daily. , Disp: , Rfl:  .  metoprolol succinate (TOPROL XL) 25 MG 24 hr tablet, Take 0.5 tablets (12.5 mg total) by mouth daily., Disp: 30 tablet, Rfl: 11 .  Multiple Vitamin (MULTIVITAMIN WITH MINERALS) TABS, Take 1 tablet by mouth daily., Disp: , Rfl:  .  nitroGLYCERIN (NITROSTAT) 0.4 MG SL tablet, Place 1 tablet (0.4 mg total) under the tongue every 5 (five) minutes x 3 doses as needed for chest pain., Disp: 25 tablet, Rfl: 12 .  Probiotic Product (DAILY PROBIOTIC PO), Take 1 tablet by mouth daily., Disp: , Rfl:  .  sildenafil (VIAGRA) 100 MG tablet, Take 100 mg by mouth daily as needed for erectile dysfunction. ,  Disp: , Rfl:  .  tamsulosin (FLOMAX) 0.4 MG CAPS, Take 0.4 mg by mouth as needed (used if her has a kidney stone)., Disp: , Rfl:   Past Medical History: Past Medical History:  Diagnosis Date  . 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 Arthritis   . Gout   . Hyperlipemia   . IBS (irritable bowel syndrome)   . Renal disorder    kidney stones  . Seasonal allergies   . Sinus congestion    chronic  . Sleep apnea    uses a c-pap    Tobacco Use: History  Smoking Status  . Never Smoker  Smokeless Tobacco  . Never Used    Labs: Recent Review Flowsheet Data    Labs for ITP Cardiac and Pulmonary Rehab Latest Ref Rng & Units 03/24/2012 04/03/2017 04/04/2017   Cholestrol 0 - 200 mg/dL - 192 157   LDLCALC 0 - 99 mg/dL - 89 58   HDL >40 mg/dL - 46 39(L)   Trlycerides <150 mg/dL - 287(H) 302(H)   Hemoglobin A1c 4.8 - 5.6 % - 5.6 -   TCO2 0 - 100 mmol/L 22 19 -      Capillary Blood Glucose: No results found for: GLUCAP   Exercise Target Goals:    Exercise Program Goal: Individual exercise prescription set with THRR, safety & activity barriers. Participant demonstrates ability to understand and report RPE  using BORG scale, to self-measure pulse accurately, and to acknowledge the importance of the exercise prescription.  Exercise Prescription Goal: Starting with aerobic activity 30 plus minutes a day, 3 days per week for initial exercise prescription. Provide home exercise prescription and guidelines that participant acknowledges understanding prior to discharge.  Activity Barriers & Risk Stratification:     Activity Barriers & Cardiac Risk Stratification - 05/23/17 1415      Activity Barriers & Cardiac Risk Stratification   Activity Barriers Other (comment);Deconditioning;Back Problems;Arthritis   Comments L ankle stiffness (achilles tendon tear)   Cardiac Risk Stratification High      6 Minute Walk:     6 Minute Walk    Row Name 05/23/17 1511          6 Minute Walk   Phase Initial     Distance 1306 feet     Walk Time 6 minutes     # of Rest Breaks 0     MPH 2.47     METS 2.2     RPE 11     VO2 Peak 7.7     Symptoms Yes (comment)     Comments low back fatigue!     Resting HR 75 bpm     Resting BP 124/68     Max Ex. HR 93 bpm     Max Ex. BP 122/90     2 Minute Post BP 122/64        Oxygen Initial Assessment:   Oxygen Re-Evaluation:   Oxygen Discharge (Final Oxygen Re-Evaluation):   Initial Exercise Prescription:     Initial Exercise Prescription - 05/23/17 1500      Date of Initial Exercise RX and Referring Provider   Date 05/23/17   Referring Provider Sherren Mocha MD     Bike   Level 0.5   Minutes 10   METs 1.94     NuStep   Level 3   SPM 70   Minutes 10   METs 2     Track   Laps 8   Minutes 10   METs 2.39     Prescription Details   Frequency (times per week) 3   Duration Progress to 30 minutes of continuous aerobic without signs/symptoms of physical distress     Intensity   THRR 40-80% of Max Heartrate 59-118   Ratings of Perceived Exertion 11-15   Perceived Dyspnea 0-4     Progression   Progression Continue to progress workloads to maintain intensity without signs/symptoms of physical distress.     Resistance Training   Training Prescription Yes   Weight 2lbs   Reps 10-15      Perform Capillary Blood Glucose checks as needed.  Exercise Prescription Changes:     Exercise Prescription Changes    Row Name 05/31/17 1632 06/13/17 1600           Response to Exercise   Blood Pressure (Admit) 120/80 150/70      Blood Pressure (Exercise) 140/80 118/70      Blood Pressure (Exit) 98/60 136/60      Heart Rate (Admit) 79 bpm 76 bpm      Heart Rate (Exercise) 104 bpm 89 bpm      Heart Rate (Exit) 76 bpm 74 bpm      Rating of Perceived Exertion (Exercise) 11 12      Symptoms pt was oriented to exercise equipment. Pt did well with first session none      Duration Continue  with  30 min of aerobic exercise without signs/symptoms of physical distress. Continue with 30 min of aerobic exercise without signs/symptoms of physical distress.      Intensity THRR unchanged THRR unchanged        Progression   Progression Continue to progress workloads to maintain intensity without signs/symptoms of physical distress. Continue to progress workloads to maintain intensity without signs/symptoms of physical distress.      Average METs 2.2 2.4        Resistance Training   Training Prescription Yes Yes      Weight 2lbs 4lbs      Reps 10-15 10-15      Time 10 Minutes 10 Minutes        Bike   Level 0.5  -      Minutes 10  -      METs 1.94  -        NuStep   Level 3 3      SPM 70 80      Minutes 102 10      METs 2 2.9        Arm Ergometer   Level  - 2      Watts  - 15      Minutes  - 10      METs  - 1.79        Track   Laps 10 9      Minutes 10 10      METs 2.74 2.57        Home Exercise Plan   Plans to continue exercise at  - Home (comment)      Frequency  - Add 2 additional days to program exercise sessions.      Initial Home Exercises Provided  - 06/12/17         Exercise Comments:     Exercise Comments    Row Name 06/13/17 1637           Exercise Comments Reviewed METs and goals. Pt is making great progress in cardiac rehab; will continue to monitor pt's activity levels.          Exercise Goals and Review:     Exercise Goals    Row Name 05/23/17 1416             Exercise Goals   Increase Physical Activity Yes       Intervention Provide advice, education, support and counseling about physical activity/exercise needs.;Develop an individualized exercise prescription for aerobic and resistive training based on initial evaluation findings, risk stratification, comorbidities and participant's personal goals.       Expected Outcomes Achievement of increased cardiorespiratory fitness and enhanced flexibility, muscular endurance and strength  shown through measurements of functional capacity and personal statement of participant.       Increase Strength and Stamina Yes  improve mobility in ankles and low back        Intervention Provide advice, education, support and counseling about physical activity/exercise needs.;Develop an individualized exercise prescription for aerobic and resistive training based on initial evaluation findings, risk stratification, comorbidities and participant's personal goals.       Expected Outcomes Achievement of increased cardiorespiratory fitness and enhanced flexibility, muscular endurance and strength shown through measurements of functional capacity and personal statement of participant.          Exercise Goals Re-Evaluation :     Exercise Goals Re-Evaluation    Row Name 06/12/17 1550 06/13/17 1636  Exercise Goal Re-Evaluation   Exercise Goals Review Understanding of Exercise Prescription;Able to understand and use rate of perceived exertion (RPE) scale;Knowledge and understanding of Target Heart Rate Range (THRR);Increase Strength and Stamina;Increase Physical Activity Increase Physical Activity;Able to understand and use rate of perceived exertion (RPE) scale;Knowledge and understanding of Target Heart Rate Range (THRR);Understanding of Exercise Prescription;Increase Strength and Stamina      Comments Reviewed home exercise with pt today.  Pt plans to ride stationary bike for exercise, 3x/week in addition to cardiac rehab.  Reviewed THR, pulse, RPE, sign and symptoms, NTG use, and when to call 911 or MD.  Also discussed weather considerations and indoor options.  Pt voiced understanding.  -      Expected Outcomes Pt will be compliant with HEP and improve in cardiorespiratory fitness  -          Discharge Exercise Prescription (Final Exercise Prescription Changes):     Exercise Prescription Changes - 06/13/17 1600      Response to Exercise   Blood Pressure (Admit) 150/70   Blood  Pressure (Exercise) 118/70   Blood Pressure (Exit) 136/60   Heart Rate (Admit) 76 bpm   Heart Rate (Exercise) 89 bpm   Heart Rate (Exit) 74 bpm   Rating of Perceived Exertion (Exercise) 12   Symptoms none   Duration Continue with 30 min of aerobic exercise without signs/symptoms of physical distress.   Intensity THRR unchanged     Progression   Progression Continue to progress workloads to maintain intensity without signs/symptoms of physical distress.   Average METs 2.4     Resistance Training   Training Prescription Yes   Weight 4lbs   Reps 10-15   Time 10 Minutes     NuStep   Level 3   SPM 80   Minutes 10   METs 2.9     Arm Ergometer   Level 2   Watts 15   Minutes 10   METs 1.79     Track   Laps 9   Minutes 10   METs 2.57     Home Exercise Plan   Plans to continue exercise at Home (comment)   Frequency Add 2 additional days to program exercise sessions.   Initial Home Exercises Provided 06/12/17      Nutrition:  Target Goals: Understanding of nutrition guidelines, daily intake of sodium 1500mg , cholesterol 200mg , calories 30% from fat and 7% or less from saturated fats, daily to have 5 or more servings of fruits and vegetables.  Biometrics:     Pre Biometrics - 05/23/17 1517      Pre Biometrics   Waist Circumference 43 inches   Hip Circumference 47 inches   Waist to Hip Ratio 0.91 %   Triceps Skinfold 25 mm   % Body Fat 34 %   Grip Strength 31 kg   Flexibility 8 in   Single Leg Stand 14.31 seconds       Nutrition Therapy Plan and Nutrition Goals:     Nutrition Therapy & Goals - 05/29/17 1016      Nutrition Therapy   Diet Therapeutic Lifestyle Changes     Personal Nutrition Goals   Nutrition Goal Wt loss of 1-2 lb/week to a wt loss goal of 6-24 lb at graduation from [employer].     Intervention Plan   Intervention Prescribe, educate and counsel regarding individualized specific dietary modifications aiming towards targeted core  components such as weight, hypertension, lipid management, diabetes, heart failure and other  comorbidities.   Expected Outcomes Short Term Goal: Understand basic principles of dietary content, such as calories, fat, sodium, cholesterol and nutrients.;Long Term Goal: Adherence to prescribed nutrition plan.      Nutrition Discharge: Nutrition Scores:     Nutrition Assessments - 05/29/17 1012      MEDFICTS Scores   Pre Score 28      Nutrition Goals Re-Evaluation:   Nutrition Goals Re-Evaluation:   Nutrition Goals Discharge (Final Nutrition Goals Re-Evaluation):   Psychosocial: Target Goals: Acknowledge presence or absence of significant depression and/or stress, maximize coping skills, provide positive support system. Participant is able to verbalize types and ability to use techniques and skills needed for reducing stress and depression.  Initial Review & Psychosocial Screening:     Initial Psych Review & Screening - 05/23/17 1609      Initial Review   Current issues with Current Stress Concerns   Source of Stress Concerns Family   Comments daughter has cancer and is receiving chemotherapy     Family Dynamics   Good Support System? Yes  Married for 53 years     Barriers   Psychosocial barriers to participate in program The patient should benefit from training in stress management and relaxation.     Screening Interventions   Interventions Encouraged to exercise      Quality of Life Scores:     Quality of Life - 05/23/17 1524      Quality of Life Scores   Health/Function Pre 22.83 %   Socioeconomic Pre 23.13 %   Psych/Spiritual Pre 22.75 %   Family Pre 21.5 %   GLOBAL Pre 22.69 %      PHQ-9: Recent Review Flowsheet Data    There is no flowsheet data to display.     Interpretation of Total Score  Total Score Depression Severity:  1-4 = Minimal depression, 5-9 = Mild depression, 10-14 = Moderate depression, 15-19 = Moderately severe depression, 20-27 =  Severe depression   Psychosocial Evaluation and Intervention:     Psychosocial Evaluation - 06/18/17 2352      Psychosocial Evaluation & Interventions   Interventions Stress management education;Relaxation education;Encouraged to exercise with the program and follow exercise prescription   Comments Pt is off to a good start encourage pt to attend education classess to increase his knowledge on how to handle stress   Continue Psychosocial Services  Follow up required by staff      Psychosocial Re-Evaluation:   Psychosocial Discharge (Final Psychosocial Re-Evaluation):   Vocational Rehabilitation: Provide vocational rehab assistance to qualifying candidates.   Vocational Rehab Evaluation & Intervention:     Vocational Rehab - 05/23/17 1622      Initial Vocational Rehab Evaluation & Intervention   Assessment shows need for Vocational Rehabilitation No  pt retired from [employer]: Education Goals: Education classes will be provided on a weekly basis, covering required topics. Participant will state understanding/return demonstration of topics presented.  Learning Barriers/Preferences:     Learning Barriers/Preferences - 05/23/17 1414      Learning Barriers/Preferences   Learning Barriers Sight   Learning Preferences Skilled Demonstration      Education Topics: Count Your Pulse:  -Group instruction provided by verbal instruction, demonstration, patient participation and written materials to support subject.  Instructors address importance of being able to find your pulse and how to count your pulse when at home without a heart monitor.  Patients get hands on experience counting their pulse with staff help  and individually.   Heart Attack, Angina, and Risk Factor Modification:  -Group instruction provided by verbal instruction, video, and written materials to support subject.  Instructors address signs and symptoms of angina and heart attacks.    Also discuss  risk factors for heart disease and how to make changes to improve heart health risk factors.   Functional Fitness:  -Group instruction provided by verbal instruction, demonstration, patient participation, and written materials to support subject.  Instructors address safety measures for doing things around the house.  Discuss how to get up and down off the floor, how to pick things up properly, how to safely get out of a chair without assistance, and balance training.   Meditation and Mindfulness:  -Group instruction provided by verbal instruction, patient participation, and written materials to support subject.  Instructor addresses importance of mindfulness and meditation practice to help reduce stress and improve awareness.  Instructor also leads participants through a meditation exercise.    Stretching for Flexibility and Mobility:  -Group instruction provided by verbal instruction, patient participation, and written materials to support subject.  Instructors lead participants through series of stretches that are designed to increase flexibility thus improving mobility.  These stretches are additional exercise for major muscle groups that are typically performed during regular warm up and cool down.   Hands Only CPR:  -Group verbal, video, and participation provides a basic overview of AHA guidelines for community CPR. Role-play of emergencies allow participants the opportunity to practice calling for help and chest compression technique with discussion of AED use.   Hypertension: -Group verbal and written instruction that provides a basic overview of hypertension including the most recent diagnostic guidelines, risk factor reduction with self-care instructions and medication management.    Nutrition I class: Heart Healthy Eating:  -Group instruction provided by PowerPoint slides, verbal discussion, and written materials to support subject matter. The instructor gives an explanation and  review of the Therapeutic Lifestyle Changes diet recommendations, which includes a discussion on lipid goals, dietary fat, sodium, fiber, plant stanol/sterol esters, sugar, and the components of a well-balanced, healthy diet.   CARDIAC REHAB PHASE II EXERCISE from 06/12/2017 in [facility]  Date  06/11/17  Educator  RD  Instruction Review Code  2- meets goals/outcomes      Nutrition II class: Lifestyle Skills:  -Group instruction provided by PowerPoint slides, verbal discussion, and written materials to support subject matter. The instructor gives an explanation and review of label reading, grocery shopping for heart health, heart healthy recipe modifications, and ways to make healthier choices when eating out.   Diabetes Question & Answer:  -Group instruction provided by PowerPoint slides, verbal discussion, and written materials to support subject matter. The instructor gives an explanation and review of diabetes co-morbidities, pre- and post-prandial blood glucose goals, pre-exercise blood glucose goals, signs, symptoms, and treatment of hypoglycemia and hyperglycemia, and foot care basics.   Diabetes Blitz:  -Group instruction provided by PowerPoint slides, verbal discussion, and written materials to support subject matter. The instructor gives an explanation and review of the physiology behind type 1 and type 2 diabetes, diabetes medications and rational behind using different medications, pre- and post-prandial blood glucose recommendations and Hemoglobin A1c goals, diabetes diet, and exercise including blood glucose guidelines for exercising safely.    Portion Distortion:  -Group instruction provided by PowerPoint slides, verbal discussion, written materials, and food models to support subject matter. The instructor gives an explanation of serving size versus portion size,  changes in portions sizes over the last 20 years, and what consists of a serving from  each food group.   Stress Management:  -Group instruction provided by verbal instruction, video, and written materials to support subject matter.  Instructors review role of stress in heart disease and how to cope with stress positively.     CARDIAC REHAB PHASE II EXERCISE from 06/12/2017 in [facility]  Date  06/05/17  Instruction Review Code  2- meets goals/outcomes      Exercising on Your Own:  -Group instruction provided by verbal instruction, power point, and written materials to support subject.  Instructors discuss benefits of exercise, components of exercise, frequency and intensity of exercise, and end points for exercise.  Also discuss use of nitroglycerin and activating EMS.  Review options of places to exercise outside of rehab.  Review guidelines for sex with heart disease.   CARDIAC REHAB PHASE II EXERCISE from 06/12/2017 in [facility]  Date  06/12/17  Instruction Review Code  2- meets goals/outcomes      Cardiac Drugs I:  -Group instruction provided by verbal instruction and written materials to support subject.  Instructor reviews cardiac drug classes: antiplatelets, anticoagulants, beta blockers, and statins.  Instructor discusses reasons, side effects, and lifestyle considerations for each drug class.   Cardiac Drugs II:  -Group instruction provided by verbal instruction and written materials to support subject.  Instructor reviews cardiac drug classes: angiotensin converting enzyme inhibitors (ACE-I), angiotensin II receptor blockers (ARBs), nitrates, and calcium channel blockers.  Instructor discusses reasons, side effects, and lifestyle considerations for each drug class.   Anatomy and Physiology of the Circulatory System:  Group verbal and written instruction and models provide basic cardiac anatomy and physiology, with the coronary electrical and arterial systems. Review of: AMI, Angina, Valve disease, Heart  Failure, Peripheral Artery Disease, Cardiac Arrhythmia, Pacemakers, and the ICD.   Other Education:  -Group or individual verbal, written, or video instructions that support the educational goals of the cardiac rehab program.   Knowledge Questionnaire Score:     Knowledge Questionnaire Score - 05/23/17 1510      Knowledge Questionnaire Score   Pre Score 23/24      Core Components/Risk Factors/Patient Goals at Admission:     Personal Goals and Risk Factors at Admission - 05/23/17 1522      Core Components/Risk Factors/Patient Goals on Admission    Weight Management Yes;Obesity;Weight Maintenance;Weight Loss   Intervention Weight Management: Develop a combined nutrition and exercise program designed to reach desired caloric intake, while maintaining appropriate intake of nutrient and fiber, sodium and fats, and appropriate energy expenditure required for the weight goal.;Weight Management: Provide education and appropriate resources to help participant work on and attain dietary goals.;Weight Management/Obesity: Establish reasonable short term and long term weight goals.;Obesity: Provide education and appropriate resources to help participant work on and attain dietary goals.   Expected Outcomes Short Term: Continue to assess and modify interventions until short term weight is achieved;Long Term: Adherence to nutrition and physical activity/exercise program aimed toward attainment of established weight goal;Weight Maintenance: Understanding of the daily nutrition guidelines, which includes 25-35% calories from fat, 7% or less cal from saturated fats, less than 200mg  cholesterol, less than 1.5gm of sodium, & 5 or more servings of fruits and vegetables daily;Weight Loss: Understanding of general recommendations for a balanced deficit meal plan, which promotes 1-2 lb weight loss per week and includes a negative energy balance of [phone]  kcal/d;Understanding recommendations for meals to include  15-35% energy as protein, 25-35% energy from fat, 35-60% energy from carbohydrates, less than 200mg  of dietary cholesterol, 20-35 gm of total fiber daily;Understanding of distribution of calorie intake throughout the day with the consumption of 4-5 meals/snacks   Lipids Yes   Intervention Provide education and support for participant on nutrition & aerobic/resistive exercise along with prescribed medications to achieve LDL 70mg , HDL >40mg .   Expected Outcomes Short Term: Participant states understanding of desired cholesterol values and is compliant with medications prescribed. Participant is following exercise prescription and nutrition guidelines.;Long Term: Cholesterol controlled with medications as prescribed, with individualized exercise RX and with personalized nutrition plan. Value goals: LDL < 70mg , HDL > 40 mg.   Stress Yes   Intervention Offer individual and/or small group education and counseling on adjustment to heart disease, stress management and health-related lifestyle change. Teach and support self-help strategies.;Refer participants experiencing significant psychosocial distress to appropriate mental health specialists for further evaluation and treatment. When possible, include family members and significant others in education/counseling sessions.   Expected Outcomes Short Term: Participant demonstrates changes in health-related behavior, relaxation and other stress management skills, ability to obtain effective social support, and compliance with psychotropic medications if prescribed.;Long Term: Emotional wellbeing is indicated by absence of clinically significant psychosocial distress or social isolation.      Core Components/Risk Factors/Patient Goals Review:      Goals and Risk Factor Review    Row Name 06/18/17 2350             Core Components/Risk Factors/Patient Goals Review   Personal Goals Review Weight Management/Obesity;Lipids;Stress       Review Pt is eager to  learn more regarding decreasing modifiable risk factors for CAD       Expected Outcomes Pt will achieve and/or progress toward desired weight.  Pt with lipid panel reading WNL.  Reduction of stress with positive and healthy coping skills so he may contribute to the community in a meaningful way          Core Components/Risk Factors/Patient Goals at Discharge (Final Review):      Goals and Risk Factor Review - 06/18/17 2350      Core Components/Risk Factors/Patient Goals Review   Personal Goals Review Weight Management/Obesity;Lipids;Stress   Review Pt is eager to learn more regarding decreasing modifiable risk factors for CAD   Expected Outcomes Pt will achieve and/or progress toward desired weight.  Pt with lipid panel reading WNL.  Reduction of stress with positive and healthy coping skills so he may contribute to the community in a meaningful way      ITP Comments:     ITP Comments    Row Name 05/23/17 1411           ITP Comments Dr. Fransico Him, Medical Director          Comments:  Octavia Bruckner is making expected progress toward personal goals after completing 6 sessions. Pt attends twice a week on a consistent basis.  Psychosocial Assessment - Pt rates his stress level as medium.  Pt daughter is currently receiving chemotherapy and he worries about her.  Pt is eager to learn what he can to be more healthier for his family. Recommend continued exercise and life style modification education including  stress management and relaxation techniques to decrease cardiac risk profile. Cherre Huger, BSN Cardiac and Training and development officer

## 2017-06-19 ENCOUNTER — Encounter (HOSPITAL_COMMUNITY)
Admission: RE | Admit: 2017-06-19 | Discharge: 2017-06-19 | Disposition: A | Discharge status: Home / Self Care | Payer: Medicare HMO | Source: Ambulatory Visit | Attending: Cardiovascular Disease | Admitting: Cardiovascular Disease

## 2017-06-19 DIAGNOSIS — Z955 Presence of coronary angioplasty implant and graft: Secondary | ICD-10-CM

## 2017-06-19 DIAGNOSIS — I2111 ST elevation (STEMI) myocardial infarction involving right coronary artery: Secondary | ICD-10-CM | POA: Diagnosis not present

## 2017-06-21 ENCOUNTER — Encounter (HOSPITAL_COMMUNITY)
Admission: RE | Admit: 2017-06-21 | Discharge: 2017-06-21 | Disposition: A | Discharge status: Home / Self Care | Payer: Medicare HMO | Source: Ambulatory Visit | Attending: Cardiovascular Disease | Admitting: Cardiovascular Disease

## 2017-06-21 DIAGNOSIS — I2111 ST elevation (STEMI) myocardial infarction involving right coronary artery: Secondary | ICD-10-CM

## 2017-06-21 DIAGNOSIS — Z955 Presence of coronary angioplasty implant and graft: Secondary | ICD-10-CM

## 2017-06-21 NOTE — Progress Notes (Signed)
Timothy Wilcox 76 y.o. male DOB July 20, 1941 MRN [id number]       Nutrition Note  1. 04/03/17 ST elevation myocardial infarction involving right coronary artery ([place])   2. 04/03/17 Status post coronary artery stent placement    Note Spoke with pt. Nutrition plan and survey reviewed with pt. Pt is following Step 1 of the Therapeutic Lifestyle Changes diet. Pt was following a low carb diet prior to his heart event. Pt is working toward changing his diet to be heart healthier. Pt wants to lose wt. Wt loss tips reviewed. Pt expressed understanding of the information reviewed. Pt aware of nutrition education classes offered and plans on attending nutrition classes.  Nutrition Diagnosis ? Food-and nutrition-related knowledge deficit related to lack of exposure to information as related to diagnosis of: ? CVD  ? Obesity related to excessive energy intake as evidenced by a BMI of 36  Nutrition Intervention ? Pt's individual nutrition plan and goals reviewed with pt. ? Benefits of adopting Therapeutic Lifestyle Changes discussed when [employer] reviewed.    Nutrition Goal(s):  ? Pt to identify food quantities necessary to achieve weight loss of 6-24 lb (2.7-10.9 kg) at graduation from cardiac rehab. Long-term pt wants to lose 50 lb.   Plan:  Pt to attend nutrition classes ? Nutrition I- met 06/11/17 ? Nutrition II - met 06/18/17 ? Portion Distortion  Will provide client-centered nutrition education as part of interdisciplinary care.   Monitor and evaluate progress toward nutrition goal with team.  Derek Mound, M.Ed, RD, LDN, CDE 06/21/2017 12:22 PM

## 2017-06-26 ENCOUNTER — Encounter (HOSPITAL_COMMUNITY)
Admission: RE | Admit: 2017-06-26 | Discharge: 2017-06-26 | Disposition: A | Discharge status: Home / Self Care | Payer: Medicare HMO | Source: Ambulatory Visit | Attending: Cardiovascular Disease | Admitting: Cardiovascular Disease

## 2017-06-26 DIAGNOSIS — I2111 ST elevation (STEMI) myocardial infarction involving right coronary artery: Secondary | ICD-10-CM

## 2017-06-26 DIAGNOSIS — Z955 Presence of coronary angioplasty implant and graft: Secondary | ICD-10-CM | POA: Diagnosis not present

## 2017-06-28 ENCOUNTER — Encounter (HOSPITAL_COMMUNITY)
Admission: RE | Admit: 2017-06-28 | Discharge: 2017-06-28 | Disposition: A | Discharge status: Home / Self Care | Payer: Medicare HMO | Source: Ambulatory Visit | Attending: Cardiovascular Disease | Admitting: Cardiovascular Disease

## 2017-06-28 DIAGNOSIS — I2111 ST elevation (STEMI) myocardial infarction involving right coronary artery: Secondary | ICD-10-CM

## 2017-06-28 DIAGNOSIS — Z955 Presence of coronary angioplasty implant and graft: Secondary | ICD-10-CM | POA: Diagnosis not present

## 2017-07-03 ENCOUNTER — Encounter (HOSPITAL_COMMUNITY)
Admission: RE | Admit: 2017-07-03 | Discharge: 2017-07-03 | Disposition: A | Discharge status: Home / Self Care | Payer: Medicare HMO | Source: Ambulatory Visit | Attending: Cardiovascular Disease | Admitting: Cardiovascular Disease

## 2017-07-03 DIAGNOSIS — Z955 Presence of coronary angioplasty implant and graft: Secondary | ICD-10-CM

## 2017-07-03 DIAGNOSIS — I2111 ST elevation (STEMI) myocardial infarction involving right coronary artery: Secondary | ICD-10-CM

## 2017-07-05 ENCOUNTER — Encounter (HOSPITAL_COMMUNITY)
Admission: RE | Admit: 2017-07-05 | Discharge: 2017-07-05 | Disposition: A | Discharge status: Home / Self Care | Payer: Medicare HMO | Source: Ambulatory Visit | Attending: Cardiovascular Disease | Admitting: Cardiovascular Disease

## 2017-07-05 DIAGNOSIS — Z955 Presence of coronary angioplasty implant and graft: Secondary | ICD-10-CM | POA: Diagnosis not present

## 2017-07-05 DIAGNOSIS — I2111 ST elevation (STEMI) myocardial infarction involving right coronary artery: Secondary | ICD-10-CM | POA: Diagnosis not present

## 2017-07-10 ENCOUNTER — Encounter (HOSPITAL_COMMUNITY)
Admission: RE | Admit: 2017-07-10 | Discharge: 2017-07-10 | Disposition: A | Discharge status: Home / Self Care | Payer: Medicare HMO | Source: Ambulatory Visit | Attending: Cardiovascular Disease | Admitting: Cardiovascular Disease

## 2017-07-10 DIAGNOSIS — Z955 Presence of coronary angioplasty implant and graft: Secondary | ICD-10-CM | POA: Diagnosis not present

## 2017-07-10 DIAGNOSIS — I2111 ST elevation (STEMI) myocardial infarction involving right coronary artery: Secondary | ICD-10-CM | POA: Insufficient documentation

## 2017-07-10 DIAGNOSIS — I213 ST elevation (STEMI) myocardial infarction of unspecified site: Secondary | ICD-10-CM | POA: Diagnosis present

## 2017-07-12 ENCOUNTER — Encounter (HOSPITAL_COMMUNITY)
Admission: RE | Admit: 2017-07-12 | Discharge: 2017-07-12 | Disposition: A | Discharge status: Home / Self Care | Payer: Medicare HMO | Source: Ambulatory Visit | Attending: Cardiovascular Disease | Admitting: Cardiovascular Disease

## 2017-07-12 DIAGNOSIS — I2111 ST elevation (STEMI) myocardial infarction involving right coronary artery: Secondary | ICD-10-CM | POA: Diagnosis not present

## 2017-07-12 DIAGNOSIS — Z955 Presence of coronary angioplasty implant and graft: Secondary | ICD-10-CM

## 2017-07-14 ENCOUNTER — Encounter (HOSPITAL_COMMUNITY): Payer: Self-pay

## 2017-07-14 NOTE — Progress Notes (Signed)
Cardiac Individual Treatment Plan  Patient Details  Name: Timothy Wilcox MRN: [id number] Date of Birth: Jul 22, 1941 Referring Provider:     CARDIAC REHAB PHASE II ORIENTATION from 05/23/2017 in [facility]  Referring Provider  Sherren Mocha MD      Initial Encounter Date:    CARDIAC REHAB PHASE II ORIENTATION from 05/23/2017 in [facility]  Date  05/23/17  Referring Provider  Sherren Mocha MD      Visit Diagnosis: 04/03/17 ST elevation myocardial infarction involving right coronary artery ([place])  04/03/17 Status post coronary artery stent placement  Patient's Home Medications on Admission:  Current Outpatient Prescriptions:  .  allopurinol (ZYLOPRIM) 300 MG tablet, Take 300 mg by mouth daily., Disp: , Rfl:  .  aspirin EC 81 MG tablet, Take 81 mg by mouth daily., Disp: , Rfl:  .  clopidogrel (PLAVIX) 75 MG tablet, Take 1 tablet (75 mg total) by mouth daily., Disp: 90 tablet, Rfl: 3 .  fluticasone (FLONASE) 50 MCG/ACT nasal spray, Place 1 spray into both nostrils 2 (two) times daily., Disp: , Rfl:  .  ibuprofen (ADVIL,MOTRIN) 200 MG tablet, Take 400 mg by mouth every 6 (six) hours as needed for headache or moderate pain. , Disp: , Rfl:  .  loratadine (CLARITIN) 10 MG tablet, Take 10 mg by mouth 2 (two) times daily. , Disp: , Rfl:  .  metoprolol succinate (TOPROL XL) 25 MG 24 hr tablet, Take 0.5 tablets (12.5 mg total) by mouth daily., Disp: 30 tablet, Rfl: 11 .  Multiple Vitamin (MULTIVITAMIN WITH MINERALS) TABS, Take 1 tablet by mouth daily., Disp: , Rfl:  .  nitroGLYCERIN (NITROSTAT) 0.4 MG SL tablet, Place 1 tablet (0.4 mg total) under the tongue every 5 (five) minutes x 3 doses as needed for chest pain., Disp: 25 tablet, Rfl: 12 .  Probiotic Product (DAILY PROBIOTIC PO), Take 1 tablet by mouth daily., Disp: , Rfl:  .  sildenafil (VIAGRA) 100 MG tablet, Take 100 mg by mouth daily as needed for erectile dysfunction. ,  Disp: , Rfl:  .  tamsulosin (FLOMAX) 0.4 MG CAPS, Take 0.4 mg by mouth as needed (used if her has a kidney stone)., Disp: , Rfl:   Past Medical History: Past Medical History:  Diagnosis Date  . 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 Arthritis   . Gout   . Hyperlipemia   . IBS (irritable bowel syndrome)   . Renal disorder    kidney stones  . Seasonal allergies   . Sinus congestion    chronic  . Sleep apnea    uses a c-pap    Tobacco Use: History  Smoking Status  . Never Smoker  Smokeless Tobacco  . Never Used    Labs: Recent Review Flowsheet Data    Labs for ITP Cardiac and Pulmonary Rehab Latest Ref Rng & Units 03/24/2012 04/03/2017 04/04/2017 07/15/2017   Cholestrol 100 - 199 mg/dL - 192 157 165   LDLCALC 0 - 99 mg/dL - 89 58 55   HDL >39 mg/dL - 46 39(L) 63   Trlycerides 0 - 149 mg/dL - 287(H) 302(H) 233(H)   Hemoglobin A1c 4.8 - 5.6 % - 5.6 - -   TCO2 0 - 100 mmol/L 22 19 - -      Capillary Blood Glucose: No results found for: GLUCAP   Exercise Target Goals:    Exercise Program Goal: Individual exercise prescription set with THRR, safety & activity  barriers. Participant demonstrates ability to understand and report RPE using BORG scale, to self-measure pulse accurately, and to acknowledge the importance of the exercise prescription.  Exercise Prescription Goal: Starting with aerobic activity 30 plus minutes a day, 3 days per week for initial exercise prescription. Provide home exercise prescription and guidelines that participant acknowledges understanding prior to discharge.  Activity Barriers & Risk Stratification:     Activity Barriers & Cardiac Risk Stratification - 05/23/17 1415      Activity Barriers & Cardiac Risk Stratification   Activity Barriers Other (comment);Deconditioning;Back Problems;Arthritis   Comments L ankle stiffness (achilles tendon tear)   Cardiac Risk Stratification High      6 Minute Walk:     6 Minute  Walk    Row Name 05/23/17 1511         6 Minute Walk   Phase Initial     Distance 1306 feet     Walk Time 6 minutes     # of Rest Breaks 0     MPH 2.47     METS 2.2     RPE 11     VO2 Peak 7.7     Symptoms Yes (comment)     Comments low back fatigue!     Resting HR 75 bpm     Resting BP 124/68     Max Ex. HR 93 bpm     Max Ex. BP 122/90     2 Minute Post BP 122/64        Oxygen Initial Assessment:   Oxygen Re-Evaluation:   Oxygen Discharge (Final Oxygen Re-Evaluation):   Initial Exercise Prescription:     Initial Exercise Prescription - 05/23/17 1500      Date of Initial Exercise RX and Referring Provider   Date 05/23/17   Referring Provider Sherren Mocha MD     Bike   Level 0.5   Minutes 10   METs 1.94     NuStep   Level 3   SPM 70   Minutes 10   METs 2     Track   Laps 8   Minutes 10   METs 2.39     Prescription Details   Frequency (times per week) 3   Duration Progress to 30 minutes of continuous aerobic without signs/symptoms of physical distress     Intensity   THRR 40-80% of Max Heartrate 59-118   Ratings of Perceived Exertion 11-15   Perceived Dyspnea 0-4     Progression   Progression Continue to progress workloads to maintain intensity without signs/symptoms of physical distress.     Resistance Training   Training Prescription Yes   Weight 2lbs   Reps 10-15      Perform Capillary Blood Glucose checks as needed.  Exercise Prescription Changes:      Exercise Prescription Changes    Row Name 05/31/17 1632 06/13/17 1600 06/28/17 1646 07/15/17 1600       Response to Exercise   Blood Pressure (Admit) 120/80 150/70 116/72 124/68    Blood Pressure (Exercise) 140/80 118/70 122/74 140/60    Blood Pressure (Exit) 98/60 136/60 110/70 104/70    Heart Rate (Admit) 79 bpm 76 bpm 78 bpm 78 bpm    Heart Rate (Exercise) 104 bpm 89 bpm 90 bpm 95 bpm    Heart Rate (Exit) 76 bpm 74 bpm 75 bpm 71 bpm    Rating of Perceived Exertion  (Exercise) 11 12 15 15     Symptoms pt was oriented to exercise  equipment. Pt did well with first session none none none    Duration Continue with 30 min of aerobic exercise without signs/symptoms of physical distress. Continue with 30 min of aerobic exercise without signs/symptoms of physical distress. Continue with 30 min of aerobic exercise without signs/symptoms of physical distress. Continue with 30 min of aerobic exercise without signs/symptoms of physical distress.    Intensity THRR unchanged THRR unchanged THRR unchanged THRR unchanged      Progression   Progression Continue to progress workloads to maintain intensity without signs/symptoms of physical distress. Continue to progress workloads to maintain intensity without signs/symptoms of physical distress. Continue to progress workloads to maintain intensity without signs/symptoms of physical distress. Continue to progress workloads to maintain intensity without signs/symptoms of physical distress.    Average METs 2.2 2.4 2.8 3.4      Resistance Training   Training Prescription Yes Yes Yes Yes    Weight 2lbs 4lbs 4lbs 5lbs    Reps 10-15 10-15 10-15 10-15    Time 10 Minutes 10 Minutes 10 Minutes 10 Minutes      Bike   Level 0.5  -  -  -    Minutes 10  -  -  -    METs 1.94  -  -  -      NuStep   Level 3 3 4 4     SPM 70 80 80 90    Minutes 102 10 10 10     METs 2 2.9 3 4.5      Arm Ergometer   Level  - 2 2 2     Watts  - 15 20 29     Minutes  - 10 10 10     METs  - 1.79 2.1 2.6      Track   Laps 10 9 10 12     Minutes 10 10 10 10     METs 2.74 2.57 2.74 3.09      Home Exercise Plan   Plans to continue exercise at  - Home (comment) Home (comment) Home (comment)    Frequency  - Add 2 additional days to program exercise sessions. Add 2 additional days to program exercise sessions. Add 2 additional days to program exercise sessions.    Initial Home Exercises Provided  - 06/12/17 06/12/17 06/12/17       Exercise Comments:       Exercise Comments    Row Name 06/13/17 1637 07/15/17 1641         Exercise Comments Reviewed METs and goals. Pt is making great progress in cardiac rehab; will continue to monitor pt's activity levels. Reviewed METs and goals. Pt is making great progress in cardiac rehab; will continue to monitor pt's activity levels.         Exercise Goals and Review:      Exercise Goals    Row Name 05/23/17 1416             Exercise Goals   Increase Physical Activity Yes       Intervention Provide advice, education, support and counseling about physical activity/exercise needs.;Develop an individualized exercise prescription for aerobic and resistive training based on initial evaluation findings, risk stratification, comorbidities and participant's personal goals.       Expected Outcomes Achievement of increased cardiorespiratory fitness and enhanced flexibility, muscular endurance and strength shown through measurements of functional capacity and personal statement of participant.       Increase Strength and Stamina Yes  improve mobility in ankles and low  back        Intervention Provide advice, education, support and counseling about physical activity/exercise needs.;Develop an individualized exercise prescription for aerobic and resistive training based on initial evaluation findings, risk stratification, comorbidities and participant's personal goals.       Expected Outcomes Achievement of increased cardiorespiratory fitness and enhanced flexibility, muscular endurance and strength shown through measurements of functional capacity and personal statement of participant.          Exercise Goals Re-Evaluation :     Exercise Goals Re-Evaluation    Row Name 06/12/17 1550 06/13/17 1636 07/15/17 1648         Exercise Goal Re-Evaluation   Exercise Goals Review Understanding of Exercise Prescription;Able to understand and use rate of perceived exertion (RPE) scale;Knowledge and understanding  of Target Heart Rate Range (THRR);Increase Strength and Stamina;Increase Physical Activity Increase Physical Activity;Able to understand and use rate of perceived exertion (RPE) scale;Knowledge and understanding of Target Heart Rate Range (THRR);Understanding of Exercise Prescription;Increase Strength and Stamina Increase Physical Activity;Able to understand and use rate of perceived exertion (RPE) scale;Knowledge and understanding of Target Heart Rate Range (THRR);Understanding of Exercise Prescription;Increase Strength and Stamina;Able to check pulse independently     Comments Reviewed home exercise with pt today.  Pt plans to ride stationary bike for exercise, 3x/week in addition to cardiac rehab.  Reviewed THR, pulse, RPE, sign and symptoms, NTG use, and when to call 911 or MD.  Also discussed weather considerations and indoor options.  Pt voiced understanding.  - Pt stated, " energy levels and strength is getting better." however, exercise is limited by back pain. Pt is active and consistent with HEP, in which rides a recumbent bike for 10-12 min. 1-2x/day and stretches.      Expected Outcomes Pt will be compliant with HEP and improve in cardiorespiratory fitness  - Pt will be compliant with HEP and improve in cardiorespiratory fitness while managing back pain.         Discharge Exercise Prescription (Final Exercise Prescription Changes):     Exercise Prescription Changes - 07/15/17 1600      Response to Exercise   Blood Pressure (Admit) 124/68   Blood Pressure (Exercise) 140/60   Blood Pressure (Exit) 104/70   Heart Rate (Admit) 78 bpm   Heart Rate (Exercise) 95 bpm   Heart Rate (Exit) 71 bpm   Rating of Perceived Exertion (Exercise) 15   Symptoms none   Duration Continue with 30 min of aerobic exercise without signs/symptoms of physical distress.   Intensity THRR unchanged     Progression   Progression Continue to progress workloads to maintain intensity without signs/symptoms of  physical distress.   Average METs 3.4     Resistance Training   Training Prescription Yes   Weight 5lbs   Reps 10-15   Time 10 Minutes     NuStep   Level 4   SPM 90   Minutes 10   METs 4.5     Arm Ergometer   Level 2   Watts 29   Minutes 10   METs 2.6     Track   Laps 12   Minutes 10   METs 3.09     Home Exercise Plan   Plans to continue exercise at Home (comment)   Frequency Add 2 additional days to program exercise sessions.   Initial Home Exercises Provided 06/12/17      Nutrition:  Target Goals: Understanding of nutrition guidelines, daily intake of sodium 1500mg , cholesterol 200mg ,  calories 30% from fat and 7% or less from saturated fats, daily to have 5 or more servings of fruits and vegetables.  Biometrics:     Pre Biometrics - 05/23/17 1517      Pre Biometrics   Waist Circumference 43 inches   Hip Circumference 47 inches   Waist to Hip Ratio 0.91 %   Triceps Skinfold 25 mm   % Body Fat 34 %   Grip Strength 31 kg   Flexibility 8 in   Single Leg Stand 14.31 seconds       Nutrition Therapy Plan and Nutrition Goals:     Nutrition Therapy & Goals - 05/29/17 1016      Nutrition Therapy   Diet Therapeutic Lifestyle Changes     Personal Nutrition Goals   Nutrition Goal Wt loss of 1-2 lb/week to a wt loss goal of 6-24 lb at graduation from [facility].     Intervention Plan   Intervention Prescribe, educate and counsel regarding individualized specific dietary modifications aiming towards targeted core components such as weight, hypertension, lipid management, diabetes, heart failure and other comorbidities.   Expected Outcomes Short Term Goal: Understand basic principles of dietary content, such as calories, fat, sodium, cholesterol and nutrients.;Long Term Goal: Adherence to prescribed nutrition plan.      Nutrition Discharge: Nutrition Scores:     Nutrition Assessments - 06/21/17 1230      MEDFICTS Scores   Pre Score 56       Nutrition Goals Re-Evaluation:   Nutrition Goals Re-Evaluation:   Nutrition Goals Discharge (Final Nutrition Goals Re-Evaluation):   Psychosocial: Target Goals: Acknowledge presence or absence of significant depression and/or stress, maximize coping skills, provide positive support system. Participant is able to verbalize types and ability to use techniques and skills needed for reducing stress and depression.  Initial Review & Psychosocial Screening:     Initial Psych Review & Screening - 05/23/17 1609      Initial Review   Current issues with Current Stress Concerns   Source of Stress Concerns Family   Comments daughter has cancer and is receiving chemotherapy     Family Dynamics   Good Support System? Yes  Married for 53 years     Barriers   Psychosocial barriers to participate in program The patient should benefit from training in stress management and relaxation.     Screening Interventions   Interventions Encouraged to exercise      Quality of Life Scores:     Quality of Life - 05/23/17 1524      Quality of Life Scores   Health/Function Pre 22.83 %   Socioeconomic Pre 23.13 %   Psych/Spiritual Pre 22.75 %   Family Pre 21.5 %   GLOBAL Pre 22.69 %      PHQ-9: Recent Review Flowsheet Data    There is no flowsheet data to display.     Interpretation of Total Score  Total Score Depression Severity:  1-4 = Minimal depression, 5-9 = Mild depression, 10-14 = Moderate depression, 15-19 = Moderately severe depression, 20-27 = Severe depression   Psychosocial Evaluation and Intervention:     Psychosocial Evaluation - 07/14/17 1845      Psychosocial Evaluation & Interventions   Interventions Stress management education;Relaxation education;Encouraged to exercise with the program and follow exercise prescription   Comments  Encourage pt to attend education classess to increase his knowledge on how to handle stress. Pt worries about his daughter who is  undergoing chemotherapy.   Continue  Psychosocial Services  Follow up required by staff      Psychosocial Re-Evaluation:     Psychosocial Re-Evaluation    Francis Name 07/14/17 1846             Psychosocial Re-Evaluation   Current issues with Current Stress Concerns       Comments Pt consistently attends exercsie two times a week.  Pt attends education classess and enjoys the meditation and imagery used during cool down       Expected Outcomes Pt will be able to demonstrate effective stress reduction tools he can use to help deal with the stress of his daughters illness.       Interventions Relaxation education;Stress management education;Encouraged to attend Cardiac Rehabilitation for the exercise       Continue Psychosocial Services  Follow up required by staff          Psychosocial Discharge (Final Psychosocial Re-Evaluation):     Psychosocial Re-Evaluation - 07/14/17 1846      Psychosocial Re-Evaluation   Current issues with Current Stress Concerns   Comments Pt consistently attends exercsie two times a week.  Pt attends education classess and enjoys the meditation and imagery used during cool down   Expected Outcomes Pt will be able to demonstrate effective stress reduction tools he can use to help deal with the stress of his daughters illness.   Interventions Relaxation education;Stress management education;Encouraged to attend Cardiac Rehabilitation for the exercise   Continue Psychosocial Services  Follow up required by staff      Vocational Rehabilitation: Provide vocational rehab assistance to qualifying candidates.   Vocational Rehab Evaluation & Intervention:     Vocational Rehab - 05/23/17 1622      Initial Vocational Rehab Evaluation & Intervention   Assessment shows need for Vocational Rehabilitation No  pt retired from [employer]: Education Goals: Education classes will be provided on a weekly basis, covering required topics. Participant will  state understanding/return demonstration of topics presented.  Learning Barriers/Preferences:     Learning Barriers/Preferences - 05/23/17 1414      Learning Barriers/Preferences   Learning Barriers Sight   Learning Preferences Skilled Demonstration      Education Topics: Count Your Pulse:  -Group instruction provided by verbal instruction, demonstration, patient participation and written materials to support subject.  Instructors address importance of being able to find your pulse and how to count your pulse when at home without a heart monitor.  Patients get hands on experience counting their pulse with staff help and individually.   Heart Attack, Angina, and Risk Factor Modification:  -Group instruction provided by verbal instruction, video, and written materials to support subject.  Instructors address signs and symptoms of angina and heart attacks.    Also discuss risk factors for heart disease and how to make changes to improve heart health risk factors.   CARDIAC REHAB PHASE II EXERCISE from 07/12/2017 in [facility]  Date  06/26/17  Instruction Review Code  2- meets goals/outcomes      Functional Fitness:  -Group instruction provided by verbal instruction, demonstration, patient participation, and written materials to support subject.  Instructors address safety measures for doing things around the house.  Discuss how to get up and down off the floor, how to pick things up properly, how to safely get out of a chair without assistance, and balance training.   CARDIAC REHAB PHASE II EXERCISE from 07/12/2017 in [facility]  REHAB  Date  06/28/17  Instruction Review Code  2- meets goals/outcomes      Meditation and Mindfulness:  -Group instruction provided by verbal instruction, patient participation, and written materials to support subject.  Instructor addresses importance of mindfulness and meditation practice to help  reduce stress and improve awareness.  Instructor also leads participants through a meditation exercise.    CARDIAC REHAB PHASE II EXERCISE from 07/12/2017 in [facility]  Date  07/10/17  Instruction Review Code  2- meets goals/outcomes      Stretching for Flexibility and Mobility:  -Group instruction provided by verbal instruction, patient participation, and written materials to support subject.  Instructors lead participants through series of stretches that are designed to increase flexibility thus improving mobility.  These stretches are additional exercise for major muscle groups that are typically performed during regular warm up and cool down.   CARDIAC REHAB PHASE II EXERCISE from 07/12/2017 in [facility]  Date  07/12/17  Instruction Review Code  2- meets goals/outcomes      Hands Only CPR:  -Group verbal, video, and participation provides a basic overview of AHA guidelines for community CPR. Role-play of emergencies allow participants the opportunity to practice calling for help and chest compression technique with discussion of AED use.   Hypertension: -Group verbal and written instruction that provides a basic overview of hypertension including the most recent diagnostic guidelines, risk factor reduction with self-care instructions and medication management.   CARDIAC REHAB PHASE II EXERCISE from 07/12/2017 in [facility]  Date  07/05/17  Instruction Review Code  2- meets goals/outcomes       Nutrition I class: Heart Healthy Eating:  -Group instruction provided by PowerPoint slides, verbal discussion, and written materials to support subject matter. The instructor gives an explanation and review of the Therapeutic Lifestyle Changes diet recommendations, which includes a discussion on lipid goals, dietary fat, sodium, fiber, plant stanol/sterol esters, sugar, and the components of a  well-balanced, healthy diet.   CARDIAC REHAB PHASE II EXERCISE from 07/12/2017 in [facility]  Date  06/11/17  Educator  RD  Instruction Review Code  2- meets goals/outcomes      Nutrition II class: Lifestyle Skills:  -Group instruction provided by PowerPoint slides, verbal discussion, and written materials to support subject matter. The instructor gives an explanation and review of label reading, grocery shopping for heart health, heart healthy recipe modifications, and ways to make healthier choices when eating out.   CARDIAC REHAB PHASE II EXERCISE from 07/12/2017 in [facility]  Date  06/18/17  Educator  RD  Instruction Review Code  2- meets goals/outcomes      Diabetes Question & Answer:  -Group instruction provided by PowerPoint slides, verbal discussion, and written materials to support subject matter. The instructor gives an explanation and review of diabetes co-morbidities, pre- and post-prandial blood glucose goals, pre-exercise blood glucose goals, signs, symptoms, and treatment of hypoglycemia and hyperglycemia, and foot care basics.   CARDIAC REHAB PHASE II EXERCISE from 07/12/2017 in [facility]  Date  06/21/17  Educator  RD  Instruction Review Code  2- meets goals/outcomes      Diabetes Blitz:  -Group instruction provided by PowerPoint slides, verbal discussion, and written materials to support subject matter. The instructor gives an explanation and review of the physiology behind type 1 and type 2 diabetes, diabetes medications  and rational behind using different medications, pre- and post-prandial blood glucose recommendations and Hemoglobin A1c goals, diabetes diet, and exercise including blood glucose guidelines for exercising safely.    Portion Distortion:  -Group instruction provided by PowerPoint slides, verbal discussion, written materials, and food models to support subject  matter. The instructor gives an explanation of serving size versus portion size, changes in portions sizes over the last 20 years, and what consists of a serving from each food group.   Stress Management:  -Group instruction provided by verbal instruction, video, and written materials to support subject matter.  Instructors review role of stress in heart disease and how to cope with stress positively.     CARDIAC REHAB PHASE II EXERCISE from 07/12/2017 in [facility]  Date  06/05/17  Instruction Review Code  2- meets goals/outcomes      Exercising on Your Own:  -Group instruction provided by verbal instruction, power point, and written materials to support subject.  Instructors discuss benefits of exercise, components of exercise, frequency and intensity of exercise, and end points for exercise.  Also discuss use of nitroglycerin and activating EMS.  Review options of places to exercise outside of rehab.  Review guidelines for sex with heart disease.   CARDIAC REHAB PHASE II EXERCISE from 07/12/2017 in [place]  Date  06/12/17  Instruction Review Code  2- meets goals/outcomes      Cardiac Drugs I:  -Group instruction provided by verbal instruction and written materials to support subject.  Instructor reviews cardiac drug classes: antiplatelets, anticoagulants, beta blockers, and statins.  Instructor discusses reasons, side effects, and lifestyle considerations for each drug class.   CARDIAC REHAB PHASE II EXERCISE from 07/12/2017 in [place]  Date  06/19/17  Instruction Review Code  2- meets goals/outcomes      Cardiac Drugs II:  -Group instruction provided by verbal instruction and written materials to support subject.  Instructor reviews cardiac drug classes: angiotensin converting enzyme inhibitors (ACE-I), angiotensin II receptor blockers (ARBs), nitrates, and calcium channel blockers.   Instructor discusses reasons, side effects, and lifestyle considerations for each drug class.   Anatomy and Physiology of the Circulatory System:  Group verbal and written instruction and models provide basic cardiac anatomy and physiology, with the coronary electrical and arterial systems. Review of: AMI, Angina, Valve disease, Heart Failure, Peripheral Artery Disease, Cardiac Arrhythmia, Pacemakers, and the ICD.   CARDIAC REHAB PHASE II EXERCISE from 07/12/2017 in [place]  Date  07/03/17  Instruction Review Code  2- meets goals/outcomes      Other Education:  -Group or individual verbal, written, or video instructions that support the educational goals of the cardiac rehab program.   Knowledge Questionnaire Score:     Knowledge Questionnaire Score - 05/23/17 1510      Knowledge Questionnaire Score   Pre Score 23/24      Core Components/Risk Factors/Patient Goals at Admission:     Personal Goals and Risk Factors at Admission - 05/23/17 1522      Core Components/Risk Factors/Patient Goals on Admission    Weight Management Yes;Obesity;Weight Maintenance;Weight Loss   Intervention Weight Management: Develop a combined nutrition and exercise program designed to reach desired caloric intake, while maintaining appropriate intake of nutrient and fiber, sodium and fats, and appropriate energy expenditure required for the weight goal.;Weight Management: Provide education and appropriate resources to help participant work on and attain dietary goals.;Weight  Management/Obesity: Establish reasonable short term and long term weight goals.;Obesity: Provide education and appropriate resources to help participant work on and attain dietary goals.   Expected Outcomes Short Term: Continue to assess and modify interventions until short term weight is achieved;Long Term: Adherence to nutrition and physical activity/exercise program aimed toward attainment of  established weight goal;Weight Maintenance: Understanding of the daily nutrition guidelines, which includes 25-35% calories from fat, 7% or less cal from saturated fats, less than 200mg  cholesterol, less than 1.5gm of sodium, & 5 or more servings of fruits and vegetables daily;Weight Loss: Understanding of general recommendations for a balanced deficit meal plan, which promotes 1-2 lb weight loss per week and includes a negative energy balance of [phone] kcal/d;Understanding recommendations for meals to include 15-35% energy as protein, 25-35% energy from fat, 35-60% energy from carbohydrates, less than 200mg  of dietary cholesterol, 20-35 gm of total fiber daily;Understanding of distribution of calorie intake throughout the day with the consumption of 4-5 meals/snacks   Lipids Yes   Intervention Provide education and support for participant on nutrition & aerobic/resistive exercise along with prescribed medications to achieve LDL 70mg , HDL >40mg .   Expected Outcomes Short Term: Participant states understanding of desired cholesterol values and is compliant with medications prescribed. Participant is following exercise prescription and nutrition guidelines.;Long Term: Cholesterol controlled with medications as prescribed, with individualized exercise RX and with personalized nutrition plan. Value goals: LDL < 70mg , HDL > 40 mg.   Stress Yes   Intervention Offer individual and/or small group education and counseling on adjustment to heart disease, stress management and health-related lifestyle change. Teach and support self-help strategies.;Refer participants experiencing significant psychosocial distress to appropriate mental health specialists for further evaluation and treatment. When possible, include family members and significant others in education/counseling sessions.   Expected Outcomes Short Term: Participant demonstrates changes in health-related behavior, relaxation and other stress management  skills, ability to obtain effective social support, and compliance with psychotropic medications if prescribed.;Long Term: Emotional wellbeing is indicated by absence of clinically significant psychosocial distress or social isolation.      Core Components/Risk Factors/Patient Goals Review:      Goals and Risk Factor Review    Row Name 06/18/17 2350 07/14/17 1844           Core Components/Risk Factors/Patient Goals Review   Personal Goals Review Weight Management/Obesity;Lipids;Stress Weight Management/Obesity;Lipids;Stress      Review Pt is eager to learn more regarding decreasing modifiable risk factors for CAD Pt is eager to learn more regarding decreasing modifiable risk factors for CAD      Expected Outcomes Pt will achieve and/or progress toward desired weight.  Pt with lipid panel reading WNL.  Reduction of stress with positive and healthy coping skills so he may contribute to the community in a meaningful way Pt will achieve and/or progress toward desired weight.  Pt with lipid panel reading WNL.  Reduction of stress with positive and healthy coping skills so he may contribute to the community in a meaningful way         Core Components/Risk Factors/Patient Goals at Discharge (Final Review):      Goals and Risk Factor Review - 07/14/17 1844      Core Components/Risk Factors/Patient Goals Review   Personal Goals Review Weight Management/Obesity;Lipids;Stress   Review Pt is eager to learn more regarding decreasing modifiable risk factors for CAD   Expected Outcomes Pt will achieve and/or progress toward desired weight.  Pt with lipid panel reading WNL.  Reduction of  stress with positive and healthy coping skills so he may contribute to the community in a meaningful way      ITP Comments:     ITP Comments    Row Name 05/23/17 1411 07/16/17 1538         ITP Comments Dr. Fransico Him, Medical Director Dr. Fransico Him, Medical Director         Comments:  Octavia Bruckner  is making  expected progress toward personal goals after completing 14 sessions. Psychosocial Assessment - Pt daughter is currently receiving chemotherapy and he worries about her. Pt is dealing with the stress in a positive way and feels exercise has helped him. Pt attends exercise 2 x week.   Pt is eager to learn what he can to be more healthier for his family.  Pt has attend nutrition classes and has made some changes. Recommend continued exercise and life style modification education including  stress management and relaxation techniques to decrease cardiac risk profile. Cherre Huger, BSN Cardiac and Training and development officer

## 2017-07-15 ENCOUNTER — Encounter (HOSPITAL_COMMUNITY)
Admission: RE | Admit: 2017-07-15 | Discharge: 2017-07-15 | Disposition: A | Discharge status: Home / Self Care | Payer: Medicare HMO | Source: Ambulatory Visit | Attending: Cardiovascular Disease | Admitting: Cardiovascular Disease

## 2017-07-15 ENCOUNTER — Other Ambulatory Visit: Payer: Medicare HMO | Admitting: *Deleted

## 2017-07-15 DIAGNOSIS — E785 Hyperlipidemia, unspecified: Secondary | ICD-10-CM

## 2017-07-15 DIAGNOSIS — Z955 Presence of coronary angioplasty implant and graft: Secondary | ICD-10-CM | POA: Diagnosis not present

## 2017-07-15 DIAGNOSIS — I2111 ST elevation (STEMI) myocardial infarction involving right coronary artery: Secondary | ICD-10-CM

## 2017-07-15 LAB — LIPID PANEL
CHOLESTEROL TOTAL: 165 mg/dL (ref 100–199)
Chol/HDL Ratio: 2.6 ratio (ref 0.0–5.0)
HDL: 63 mg/dL (ref >39)
LDL Calculated: 55 mg/dL (ref 0–99)
Triglycerides: 233 mg/dL — ABNORMAL HIGH (ref 0–149)
VLDL CHOLESTEROL CAL: 47 mg/dL — AB (ref 5–40)

## 2017-07-15 NOTE — Progress Notes (Deleted)
Patient ID: Timothy Wilcox                 DOB: January 15, 1941                    MRN: [id number]     HPI: Timothy Wilcox is a 76 y.o. male patient of Dr. Burt Knack that presents today for f/u Rx clinic referred for lipid evaluation.  PMH includes CAD, STEMI in June 2018, HLD. He was recently seen by Dr. Burt Knack for follow up. He has had labs (see below) about 1 month post-event that reveal elevated LDL and TG.   At previous Rx clinic visit on 05/22/2017, patient was hesitant to start statin 2/2 previous experience with slow-to-resolve joint pain. Extensive discussion had about PCSK9-I's, statins and ezetimibe, in addition to lifestyle modifications. Ultimately, it was decided that patient will attempt to control lipids with lifestyle modifications and cardiac rehab.   Patient presents today for lipid panel re-check after implementing lifestyle modifications.   ***lipid panel ***diet, weight loss *** EtOH intake   Risk Factors: STEMI 03/2017  LDL Goal: <70, TG <150  Current Medications: none  Intolerances: Lovaza - stopped due to IBS diarrhea decreased after discontinuation of Lovaza, simvastatin for 10 days and was unable to even sit without pain (pain in joints, can't move knees) - once stopped symptoms improved, but took 6 months to fully recover   Diet: Has been on low carb diet for several years now. Has been eating a lot of meat (pork, bacon, eggs, and sausage). Prepares meats baked and fried. He is not good about eating vegetables. Does not eat deep fried food. Avoids french fries.   Exercise: cardiac rehab***  Family History: No known family history of cardiovascular disease.   Social History: The patient reports that he has never smoked. He has never used smokeless tobacco. He reports that he drinks alcohol. He drinks about 5-6 days per week and drinks a whole bottle of wine. He used to drink a lot of beer but has decreased that over the last few years. He reports that he  does not use drugs.  Labs: From [facility] 04/27/17 - TC 195, HDL 42, LDL 94, TG 295 - no cholesterol lowering medications  Past Medical History:  Diagnosis Date  . 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 Arthritis   . Gout   . Hyperlipemia   . IBS (irritable bowel syndrome)   . Renal disorder    kidney stones  . Seasonal allergies   . Sinus congestion    chronic  . Sleep apnea    uses a c-pap    Current Outpatient Prescriptions on File Prior to Visit  Medication Sig Dispense Refill  . allopurinol (ZYLOPRIM) 300 MG tablet Take 300 mg by mouth daily.    Marland Kitchen aspirin EC 81 MG tablet Take 81 mg by mouth daily.    . clopidogrel (PLAVIX) 75 MG tablet Take 1 tablet (75 mg total) by mouth daily. 90 tablet 3  . fluticasone (FLONASE) 50 MCG/ACT nasal spray Place 1 spray into both nostrils 2 (two) times daily.    Marland Kitchen ibuprofen (ADVIL,MOTRIN) 200 MG tablet Take 400 mg by mouth every 6 (six) hours as needed for headache or moderate pain.     Marland Kitchen loratadine (CLARITIN) 10 MG tablet Take 10 mg by mouth 2 (two) times daily.     . metoprolol succinate (TOPROL XL) 25 MG 24 hr tablet Take 0.5 tablets (12.5 mg total) by  mouth daily. 30 tablet 11  . Multiple Vitamin (MULTIVITAMIN WITH MINERALS) TABS Take 1 tablet by mouth daily.    . nitroGLYCERIN (NITROSTAT) 0.4 MG SL tablet Place 1 tablet (0.4 mg total) under the tongue every 5 (five) minutes x 3 doses as needed for chest pain. 25 tablet 12  . Probiotic Product (DAILY PROBIOTIC PO) Take 1 tablet by mouth daily.    . sildenafil (VIAGRA) 100 MG tablet Take 100 mg by mouth daily as needed for erectile dysfunction.     . tamsulosin (FLOMAX) 0.4 MG CAPS Take 0.4 mg by mouth as needed (used if her has a kidney stone).     No current facility-administered medications on file prior to visit.     Allergies  Allergen Reactions  . Brilinta [Ticagrelor] Shortness Of Breath    Dizziness, weakness. Caused hospital admission.  . Statins Other (See  Comments)    Pain in joints, cant move knees  . Dicyclomine Other (See Comments)    Interfered with sleep,nervous  . Tagamet [Cimetidine] Other (See Comments)    Gynecomastia     Assessment/Plan:  1. Hyperlipidemia -  Crestor vs zetia

## 2017-07-15 NOTE — Addendum Note (Signed)
Encounter addended by: Dorna Bloom D on: 07/15/2017  4:50 PM<BR>    Actions taken: Flowsheet accepted, Flowsheet data copied forward, Visit Navigator Flowsheet section accepted

## 2017-07-16 ENCOUNTER — Ambulatory Visit: Payer: Medicare HMO | Admitting: Pharmacist

## 2017-07-17 ENCOUNTER — Encounter (HOSPITAL_COMMUNITY)
Admission: RE | Admit: 2017-07-17 | Discharge: 2017-07-17 | Disposition: A | Discharge status: Home / Self Care | Payer: Medicare HMO | Source: Ambulatory Visit | Attending: Cardiovascular Disease | Admitting: Cardiovascular Disease

## 2017-07-17 DIAGNOSIS — I2111 ST elevation (STEMI) myocardial infarction involving right coronary artery: Secondary | ICD-10-CM | POA: Diagnosis not present

## 2017-07-17 DIAGNOSIS — R69 Illness, unspecified: Secondary | ICD-10-CM | POA: Diagnosis not present

## 2017-07-17 DIAGNOSIS — Z955 Presence of coronary angioplasty implant and graft: Secondary | ICD-10-CM

## 2017-07-19 ENCOUNTER — Encounter (HOSPITAL_COMMUNITY): Payer: Medicare HMO

## 2017-07-24 ENCOUNTER — Encounter (HOSPITAL_COMMUNITY): Payer: Medicare HMO

## 2017-07-24 DIAGNOSIS — G4733 Obstructive sleep apnea (adult) (pediatric): Secondary | ICD-10-CM | POA: Diagnosis not present

## 2017-07-26 ENCOUNTER — Encounter (HOSPITAL_COMMUNITY)
Admission: RE | Admit: 2017-07-26 | Discharge: 2017-07-26 | Disposition: A | Discharge status: Home / Self Care | Payer: Medicare HMO | Source: Ambulatory Visit | Attending: Cardiovascular Disease | Admitting: Cardiovascular Disease

## 2017-07-26 DIAGNOSIS — Z955 Presence of coronary angioplasty implant and graft: Secondary | ICD-10-CM

## 2017-07-26 DIAGNOSIS — I2111 ST elevation (STEMI) myocardial infarction involving right coronary artery: Secondary | ICD-10-CM | POA: Diagnosis not present

## 2017-07-31 ENCOUNTER — Encounter (HOSPITAL_COMMUNITY)
Admission: RE | Admit: 2017-07-31 | Discharge: 2017-07-31 | Disposition: A | Discharge status: Home / Self Care | Payer: Medicare HMO | Source: Ambulatory Visit | Attending: Cardiovascular Disease | Admitting: Cardiovascular Disease

## 2017-07-31 VITALS — Ht 67.0 in | Wt 210.5 lb

## 2017-07-31 DIAGNOSIS — Z955 Presence of coronary angioplasty implant and graft: Secondary | ICD-10-CM

## 2017-07-31 DIAGNOSIS — I2111 ST elevation (STEMI) myocardial infarction involving right coronary artery: Secondary | ICD-10-CM

## 2017-07-31 NOTE — Progress Notes (Signed)
Discharge Progress Report  Patient Details  Name: Timothy Wilcox MRN: [id number] Date of Birth: 12/29/1940 Referring Provider:     [place] from 05/23/2017 in [facility]  Referring Provider  Sherren Mocha MD       Number of Visits: 18  Reason for Discharge:  Patient reached a stable level of exercise. Patient independent in their exercise. Patient has met program and personal goals.  Smoking History:  Social History   Tobacco Use  Smoking Status Never Smoker  Smokeless Tobacco Never Used    Diagnosis:  04/03/17 ST elevation myocardial infarction involving right coronary artery ([place])  04/03/17 Status post coronary artery stent placement  ADL [facility]:   Initial Exercise Prescription: Initial Exercise Prescription - 05/23/17 1500      Date of Initial Exercise RX and Referring Provider   Date  05/23/17    Referring Provider  Sherren Mocha MD      Bike   Level  0.5    Minutes  10    METs  1.94      NuStep   Level  3    SPM  70    Minutes  10    METs  2      Track   Laps  8    Minutes  10    METs  2.39      Prescription Details   Frequency (times per week)  3    Duration  Progress to 30 minutes of continuous aerobic without signs/symptoms of physical distress      Intensity   THRR 40-80% of Max Heartrate  59-118    Ratings of Perceived Exertion  11-15    Perceived Dyspnea  0-4      Progression   Progression  Continue to progress workloads to maintain intensity without signs/symptoms of physical distress.      Resistance Training   Training Prescription  Yes    Weight  2lbs    Reps  10-15       Discharge Exercise Prescription (Final Exercise Prescription Changes): Exercise Prescription Changes - 07/31/17 0819      Response to Exercise   Blood Pressure (Admit)  124/64    Blood Pressure (Exercise)  142/82    Blood Pressure (Exit)  132/82    Heart Rate (Admit)  72 bpm    Heart Rate  (Exercise)  93 bpm    Heart Rate (Exit)  72 bpm    Rating of Perceived Exertion (Exercise)  14    Symptoms  none    Comments  pt particpated in a 6 minute walk test and post measurements    Duration  Continue with 30 min of aerobic exercise without signs/symptoms of physical distress.    Intensity  THRR unchanged      Progression   Progression  Continue to progress workloads to maintain intensity without signs/symptoms of physical distress.    Average METs  3.4      Resistance Training   Training Prescription  -- relaxation day   relaxation day     NuStep   Level  5    SPM  80    Minutes  20    METs  3.4      Track   Laps  7    Minutes  6    METs  3.3      Home Exercise Plan   Plans to continue exercise at  Home (comment)  Frequency  Add 2 additional days to program exercise sessions.    Initial Home Exercises Provided  06/12/17       Functional Capacity: 6 Minute Walk    Row Name 05/23/17 1511 07/31/17 1541       6 Minute Walk   Phase  Initial  Discharge    Distance  1306 feet  1500 feet    Distance % Change  -  14.85 %    Distance Feet Change  -  194 ft    Walk Time  6 minutes  6 minutes    # of Rest Breaks  0  0    MPH  2.47  2.84    METS  2.2  2.8    RPE  11  12    VO2 Peak  7.7  -    Symptoms  Yes (comment)  No    Comments  low back fatigue!  -    Resting HR  75 bpm  72 bpm    Resting BP  124/68  124/64    Max Ex. HR  93 bpm  93 bpm    Max Ex. BP  122/90  142/70    2 Minute Post BP  122/64  132/82       Psychological, QOL, Others - Outcomes: PHQ 2/9: Depression screen PHQ 2/9 07/31/2017  Decreased Interest 0  Down, Depressed, Hopeless 0  PHQ - 2 Score 0    Quality of Life: Quality of Life - 07/31/17 1539      Quality of Life Scores   Health/Function Pre  -- pt declined post QOL   pt declined post QOL      Personal Goals: Goals established at orientation with interventions provided to work toward goal. Personal Goals and Risk  Factors at Admission - 05/23/17 1522      Core Components/Risk Factors/Patient Goals on Admission    Weight Management  Yes;Obesity;Weight Maintenance;Weight Loss    Intervention  Weight Management: Develop a combined nutrition and exercise program designed to reach desired caloric intake, while maintaining appropriate intake of nutrient and fiber, sodium and fats, and appropriate energy expenditure required for the weight goal.;Weight Management: Provide education and appropriate resources to help participant work on and attain dietary goals.;Weight Management/Obesity: Establish reasonable short term and long term weight goals.;Obesity: Provide education and appropriate resources to help participant work on and attain dietary goals.    Expected Outcomes  Short Term: Continue to assess and modify interventions until short term weight is achieved;Long Term: Adherence to nutrition and physical activity/exercise program aimed toward attainment of established weight goal;Weight Maintenance: Understanding of the daily nutrition guidelines, which includes 25-35% calories from fat, 7% or less cal from saturated fats, less than 261m cholesterol, less than 1.5gm of sodium, & 5 or more servings of fruits and vegetables daily;Weight Loss: Understanding of general recommendations for a balanced deficit meal plan, which promotes 1-2 lb weight loss per week and includes a negative energy balance of [phone] kcal/d;Understanding recommendations for meals to include 15-35% energy as protein, 25-35% energy from fat, 35-60% energy from carbohydrates, less than 2077mof dietary cholesterol, 20-35 gm of total fiber daily;Understanding of distribution of calorie intake throughout the day with the consumption of 4-5 meals/snacks    Lipids  Yes    Intervention  Provide education and support for participant on nutrition & aerobic/resistive exercise along with prescribed medications to achieve LDL <70100mHDL >77m7m  Expected  Outcomes  Short Term: Participant states  understanding of desired cholesterol values and is compliant with medications prescribed. Participant is following exercise prescription and nutrition guidelines.;Long Term: Cholesterol controlled with medications as prescribed, with individualized exercise RX and with personalized nutrition plan. Value goals: LDL < 66m, HDL > 40 mg.    Stress  Yes    Intervention  Offer individual and/or small group education and counseling on adjustment to heart disease, stress management and health-related lifestyle change. Teach and support self-help strategies.;Refer participants experiencing significant psychosocial distress to appropriate mental health specialists for further evaluation and treatment. When possible, include family members and significant others in education/counseling sessions.    Expected Outcomes  Short Term: Participant demonstrates changes in health-related behavior, relaxation and other stress management skills, ability to obtain effective social support, and compliance with psychotropic medications if prescribed.;Long Term: Emotional wellbeing is indicated by absence of clinically significant psychosocial distress or social isolation.        Personal Goals Discharge: Goals and Risk Factor Review    Row Name 06/18/17 2350 07/14/17 1844 07/31/17 1312         Core Components/Risk Factors/Patient Goals Review   Personal Goals Review  Weight Management/Obesity;Lipids;Stress  Weight Management/Obesity;Lipids;Stress  Weight Management/Obesity;Lipids;Stress     Review  Pt is eager to learn more regarding decreasing modifiable risk factors for CAD  Pt is eager to learn more regarding decreasing modifiable risk factors for CAD  Pt is eager to learn more regarding decreasing modifiable risk factors for CAD     Expected Outcomes  Pt will achieve and/or progress toward desired weight.  Pt with lipid panel reading WNL.  Reduction of stress with positive and  healthy coping skills so he may contribute to the community in a meaningful way  Pt will achieve and/or progress toward desired weight.  Pt with lipid panel reading WNL.  Reduction of stress with positive and healthy coping skills so he may contribute to the community in a meaningful way  -        Exercise Goals and Review: Exercise Goals    Row Name 05/23/17 1416             Exercise Goals   Increase Physical Activity  Yes       Intervention  Provide advice, education, support and counseling about physical activity/exercise needs.;Develop an individualized exercise prescription for aerobic and resistive training based on initial evaluation findings, risk stratification, comorbidities and participant's personal goals.       Expected Outcomes  Achievement of increased cardiorespiratory fitness and enhanced flexibility, muscular endurance and strength shown through measurements of functional capacity and personal statement of participant.       Increase Strength and Stamina  Yes improve mobility in ankles and low back        Intervention  Provide advice, education, support and counseling about physical activity/exercise needs.;Develop an individualized exercise prescription for aerobic and resistive training based on initial evaluation findings, risk stratification, comorbidities and participant's personal goals.       Expected Outcomes  Achievement of increased cardiorespiratory fitness and enhanced flexibility, muscular endurance and strength shown through measurements of functional capacity and personal statement of participant.          Nutrition & Weight - Outcomes: Pre Biometrics - 05/23/17 1517      Pre Biometrics   Waist Circumference  43 inches    Hip Circumference  47 inches    Waist to Hip Ratio  0.91 %    Triceps Skinfold  25 mm    %  Body Fat  34 %    Grip Strength  31 kg    Flexibility  8 in    Single Leg Stand  14.31 seconds      Post Biometrics - 07/31/17 1539        Post  Biometrics   Height  '5\' 7"'  (1.702 m)    Weight  210 lb 8.6 oz (95.5 kg)    Waist Circumference  41.75 inches    Hip Circumference  45.5 inches    Waist to Hip Ratio  0.92 %    BMI (Calculated)  32.97    Triceps Skinfold  22 mm    % Body Fat  32.2 %    Grip Strength  36 kg    Flexibility  10 in    Single Leg Stand  30 seconds       Nutrition: Nutrition Therapy & Goals - 05/29/17 1016      Nutrition Therapy   Diet  Therapeutic Lifestyle Changes      Personal Nutrition Goals   Nutrition Goal  Wt loss of 1-2 lb/week to a wt loss goal of 6-24 lb at graduation from [place].      Intervention Plan   Intervention  Prescribe, educate and counsel regarding individualized specific dietary modifications aiming towards targeted core components such as weight, hypertension, lipid management, diabetes, heart failure and other comorbidities.    Expected Outcomes  Short Term Goal: Understand basic principles of dietary content, such as calories, fat, sodium, cholesterol and nutrients.;Long Term Goal: Adherence to prescribed nutrition plan.       Nutrition Discharge: Nutrition Assessments - 07/31/17 1414      MEDFICTS Scores   Pre Score  56    Post Score  9    Score Difference  -47       Education Questionnaire Score: Knowledge Questionnaire Score - 07/31/17 1533      Knowledge Questionnaire Score   Post Score  24/24       Goals reviewed with patient. Pt graduated from cardiac rehab program today with completion of 18 exercise sessions in Phase II. Pt maintained good attendance and progressed nicely during his participation in rehab as evidenced by increased MET level.   Medication list reconciled. Repeat  PHQ score-0. Pt elected not to complete the post assessment quality of life survey.  Pt has made significant lifestyle changes and should be commended for his success. Pt with weight loss of 19 pounds! Pt has increased flexibility in his ankles and developed an exercise  routine. It is for this reason pt would like to graduate early.  Pt feels he has achieved his goals during cardiac rehab.   Pt plans to continue exercise at home with the recumbent bike and stepper 5 x a week. Cherre Huger, BSN Cardiac and Training and development officer

## 2017-08-02 ENCOUNTER — Encounter (HOSPITAL_COMMUNITY): Admission: RE | Admit: 2017-08-02 | Payer: Medicare HMO | Source: Ambulatory Visit

## 2017-08-07 ENCOUNTER — Encounter (HOSPITAL_COMMUNITY): Payer: Medicare HMO

## 2017-08-09 ENCOUNTER — Encounter (HOSPITAL_COMMUNITY): Payer: Medicare HMO

## 2017-08-14 ENCOUNTER — Encounter (HOSPITAL_COMMUNITY): Payer: Medicare HMO

## 2017-08-14 DIAGNOSIS — L291 Pruritus scroti: Secondary | ICD-10-CM | POA: Diagnosis not present

## 2017-08-14 DIAGNOSIS — L570 Actinic keratosis: Secondary | ICD-10-CM | POA: Diagnosis not present

## 2017-08-16 ENCOUNTER — Encounter (HOSPITAL_COMMUNITY): Payer: Medicare HMO

## 2017-08-19 ENCOUNTER — Ambulatory Visit: Payer: Medicare HMO | Admitting: Physician Assistant

## 2017-08-21 ENCOUNTER — Encounter (HOSPITAL_COMMUNITY): Payer: Medicare HMO

## 2017-08-23 ENCOUNTER — Encounter (HOSPITAL_COMMUNITY): Payer: Medicare HMO

## 2017-08-28 ENCOUNTER — Encounter (HOSPITAL_COMMUNITY): Payer: Medicare HMO

## 2017-08-30 ENCOUNTER — Encounter (HOSPITAL_COMMUNITY): Payer: Medicare HMO

## 2017-09-04 ENCOUNTER — Encounter (HOSPITAL_COMMUNITY): Payer: Medicare HMO

## 2017-09-06 ENCOUNTER — Encounter (HOSPITAL_COMMUNITY): Payer: Medicare HMO

## 2017-09-11 ENCOUNTER — Encounter (HOSPITAL_COMMUNITY): Payer: Medicare HMO

## 2017-09-11 DIAGNOSIS — Z85828 Personal history of other malignant neoplasm of skin: Secondary | ICD-10-CM | POA: Diagnosis not present

## 2017-09-11 DIAGNOSIS — L821 Other seborrheic keratosis: Secondary | ICD-10-CM | POA: Diagnosis not present

## 2017-09-11 DIAGNOSIS — Z86018 Personal history of other benign neoplasm: Secondary | ICD-10-CM | POA: Diagnosis not present

## 2017-09-11 DIAGNOSIS — D485 Neoplasm of uncertain behavior of skin: Secondary | ICD-10-CM | POA: Diagnosis not present

## 2017-09-11 DIAGNOSIS — D1801 Hemangioma of skin and subcutaneous tissue: Secondary | ICD-10-CM | POA: Diagnosis not present

## 2017-09-11 DIAGNOSIS — D225 Melanocytic nevi of trunk: Secondary | ICD-10-CM | POA: Diagnosis not present

## 2017-09-11 DIAGNOSIS — L814 Other melanin hyperpigmentation: Secondary | ICD-10-CM | POA: Diagnosis not present

## 2017-09-11 DIAGNOSIS — Z23 Encounter for immunization: Secondary | ICD-10-CM | POA: Diagnosis not present

## 2017-09-11 DIAGNOSIS — L291 Pruritus scroti: Secondary | ICD-10-CM | POA: Diagnosis not present

## 2017-09-11 DIAGNOSIS — C44319 Basal cell carcinoma of skin of other parts of face: Secondary | ICD-10-CM | POA: Diagnosis not present

## 2017-09-13 ENCOUNTER — Encounter (HOSPITAL_COMMUNITY): Payer: Medicare HMO

## 2017-09-18 ENCOUNTER — Encounter (HOSPITAL_COMMUNITY): Payer: Medicare HMO

## 2017-09-20 ENCOUNTER — Encounter (HOSPITAL_COMMUNITY): Payer: Medicare HMO

## 2017-09-25 ENCOUNTER — Encounter (HOSPITAL_COMMUNITY): Payer: Medicare HMO

## 2017-09-27 ENCOUNTER — Encounter (HOSPITAL_COMMUNITY): Payer: Medicare HMO

## 2017-10-02 ENCOUNTER — Encounter (HOSPITAL_COMMUNITY): Payer: Medicare HMO

## 2017-10-04 ENCOUNTER — Encounter (HOSPITAL_COMMUNITY): Payer: Medicare HMO

## 2017-10-09 ENCOUNTER — Encounter (HOSPITAL_COMMUNITY): Payer: Medicare HMO

## 2017-10-11 ENCOUNTER — Encounter (HOSPITAL_COMMUNITY): Payer: Medicare HMO

## 2017-10-16 ENCOUNTER — Encounter (HOSPITAL_COMMUNITY): Payer: Medicare HMO

## 2017-10-18 ENCOUNTER — Encounter (HOSPITAL_COMMUNITY): Payer: Medicare HMO

## 2017-11-06 DIAGNOSIS — H25099 Other age-related incipient cataract, unspecified eye: Secondary | ICD-10-CM | POA: Diagnosis not present

## 2017-11-06 DIAGNOSIS — H524 Presbyopia: Secondary | ICD-10-CM | POA: Diagnosis not present

## 2018-01-23 DIAGNOSIS — G4733 Obstructive sleep apnea (adult) (pediatric): Secondary | ICD-10-CM | POA: Diagnosis not present

## 2018-01-29 ENCOUNTER — Encounter: Payer: Self-pay | Admitting: Cardiovascular Disease

## 2018-01-29 NOTE — Telephone Encounter (Signed)
Confirmed with patient he has no symptoms. He states he "feels great." Scheduled patient next available with Richardson Dopp, [place] 5/22.  He understands to call prior to that time if he has any questions/concerns. He was grateful for assistance.

## 2018-02-05 ENCOUNTER — Encounter: Payer: Self-pay | Admitting: Physician Assistant

## 2018-02-26 ENCOUNTER — Other Ambulatory Visit: Payer: Self-pay

## 2018-02-26 ENCOUNTER — Encounter: Payer: Self-pay | Admitting: Physician Assistant

## 2018-02-26 ENCOUNTER — Inpatient Hospital Stay (HOSPITAL_COMMUNITY)
Admission: EM | Admit: 2018-02-26 | Discharge: 2018-02-28 | DRG: 247 | Disposition: A | Discharge status: Home / Self Care | Payer: Medicare HMO | Attending: Cardiovascular Disease | Admitting: Cardiovascular Disease

## 2018-02-26 ENCOUNTER — Ambulatory Visit: Payer: Medicare HMO | Admitting: Physician Assistant

## 2018-02-26 ENCOUNTER — Encounter (HOSPITAL_COMMUNITY): Payer: Self-pay

## 2018-02-26 ENCOUNTER — Emergency Department (HOSPITAL_COMMUNITY): Payer: Medicare HMO

## 2018-02-26 VITALS — BP 114/62 | HR 74 | Ht 67.0 in | Wt 216.0 lb

## 2018-02-26 DIAGNOSIS — Z7982 Long term (current) use of aspirin: Secondary | ICD-10-CM

## 2018-02-26 DIAGNOSIS — E785 Hyperlipidemia, unspecified: Secondary | ICD-10-CM

## 2018-02-26 DIAGNOSIS — Z7902 Long term (current) use of antithrombotics/antiplatelets: Secondary | ICD-10-CM

## 2018-02-26 DIAGNOSIS — I2510 Atherosclerotic heart disease of native coronary artery without angina pectoris: Secondary | ICD-10-CM | POA: Diagnosis not present

## 2018-02-26 DIAGNOSIS — Z888 Allergy status to other drugs, medicaments and biological substances status: Secondary | ICD-10-CM

## 2018-02-26 DIAGNOSIS — Z87442 Personal history of urinary calculi: Secondary | ICD-10-CM

## 2018-02-26 DIAGNOSIS — G4730 Sleep apnea, unspecified: Secondary | ICD-10-CM | POA: Diagnosis present

## 2018-02-26 DIAGNOSIS — M1990 Unspecified osteoarthritis, unspecified site: Secondary | ICD-10-CM | POA: Diagnosis present

## 2018-02-26 DIAGNOSIS — R42 Dizziness and giddiness: Secondary | ICD-10-CM | POA: Diagnosis present

## 2018-02-26 DIAGNOSIS — Z8679 Personal history of other diseases of the circulatory system: Secondary | ICD-10-CM | POA: Diagnosis not present

## 2018-02-26 DIAGNOSIS — R0609 Other forms of dyspnea: Secondary | ICD-10-CM | POA: Diagnosis not present

## 2018-02-26 DIAGNOSIS — M109 Gout, unspecified: Secondary | ICD-10-CM | POA: Diagnosis present

## 2018-02-26 DIAGNOSIS — I25118 Atherosclerotic heart disease of native coronary artery with other forms of angina pectoris: Secondary | ICD-10-CM | POA: Diagnosis not present

## 2018-02-26 DIAGNOSIS — I209 Angina pectoris, unspecified: Secondary | ICD-10-CM

## 2018-02-26 DIAGNOSIS — R0602 Shortness of breath: Secondary | ICD-10-CM | POA: Diagnosis not present

## 2018-02-26 DIAGNOSIS — Z955 Presence of coronary angioplasty implant and graft: Secondary | ICD-10-CM

## 2018-02-26 DIAGNOSIS — K589 Irritable bowel syndrome without diarrhea: Secondary | ICD-10-CM | POA: Diagnosis present

## 2018-02-26 DIAGNOSIS — Z7951 Long term (current) use of inhaled steroids: Secondary | ICD-10-CM

## 2018-02-26 DIAGNOSIS — I249 Acute ischemic heart disease, unspecified: Secondary | ICD-10-CM | POA: Insufficient documentation

## 2018-02-26 DIAGNOSIS — I252 Old myocardial infarction: Secondary | ICD-10-CM

## 2018-02-26 HISTORY — DX: Atherosclerotic heart disease of native coronary artery without angina pectoris: I25.10

## 2018-02-26 LAB — CBC
HEMATOCRIT: 47.4 % (ref 39.0–52.0)
Hemoglobin: 16.1 g/dL (ref 13.0–17.0)
MCH: 32.5 pg (ref 26.0–34.0)
MCHC: 34 g/dL (ref 30.0–36.0)
MCV: 95.6 fL (ref 78.0–100.0)
Platelets: 221 10*3/uL (ref 150–400)
RBC: 4.96 MIL/uL (ref 4.22–5.81)
RDW: 12.8 % (ref 11.5–15.5)
WBC: 5.8 10*3/uL (ref 4.0–10.5)

## 2018-02-26 LAB — TROPONIN I

## 2018-02-26 LAB — BASIC METABOLIC PANEL
Anion gap: 11 (ref 5–15)
BUN: 18 mg/dL (ref 6–20)
CHLORIDE: 105 mmol/L (ref 101–111)
CO2: 24 mmol/L (ref 22–32)
Calcium: 10.4 mg/dL — ABNORMAL HIGH (ref 8.9–10.3)
Creatinine, Ser: 1 mg/dL (ref 0.61–1.24)
GFR calc Af Amer: >60 mL/min (ref >60)
GFR calc non Af Amer: >60 mL/min (ref >60)
GLUCOSE: 73 mg/dL (ref 65–99)
POTASSIUM: 4.7 mmol/L (ref 3.5–5.1)
Sodium: 140 mmol/L (ref 135–145)

## 2018-02-26 LAB — COMPREHENSIVE METABOLIC PANEL
ALK PHOS: 59 U/L (ref 38–126)
ALT: 43 U/L (ref 17–63)
AST: 31 U/L (ref 15–41)
Albumin: 3.9 g/dL (ref 3.5–5.0)
Anion gap: 8 (ref 5–15)
BUN: 17 mg/dL (ref 6–20)
CALCIUM: 10 mg/dL (ref 8.9–10.3)
CO2: 25 mmol/L (ref 22–32)
CREATININE: 1.05 mg/dL (ref 0.61–1.24)
Chloride: 105 mmol/L (ref 101–111)
GFR calc Af Amer: >60 mL/min (ref >60)
GFR calc non Af Amer: >60 mL/min (ref >60)
Glucose, Bld: 96 mg/dL (ref 65–99)
Potassium: 4.4 mmol/L (ref 3.5–5.1)
Sodium: 138 mmol/L (ref 135–145)
TOTAL PROTEIN: 7 g/dL (ref 6.5–8.1)
Total Bilirubin: 0.7 mg/dL (ref 0.3–1.2)

## 2018-02-26 LAB — URINALYSIS, ROUTINE W REFLEX MICROSCOPIC
BILIRUBIN URINE: NEGATIVE
GLUCOSE, UA: NEGATIVE mg/dL
HGB URINE DIPSTICK: NEGATIVE
Ketones, ur: NEGATIVE mg/dL
LEUKOCYTES UA: NEGATIVE
Nitrite: NEGATIVE
PH: 5 (ref 5.0–8.0)
Protein, ur: NEGATIVE mg/dL
Specific Gravity, Urine: 1.011 (ref 1.005–1.030)

## 2018-02-26 LAB — DIFFERENTIAL
ABS IMMATURE GRANULOCYTES: 0 10*3/uL (ref 0.0–0.1)
Basophils Absolute: 0.1 10*3/uL (ref 0.0–0.1)
Basophils Relative: 1 %
EOS ABS: 0.2 10*3/uL (ref 0.0–0.7)
Eosinophils Relative: 3 %
IMMATURE GRANULOCYTES: 0 %
Lymphocytes Relative: 28 %
Lymphs Abs: 1.6 10*3/uL (ref 0.7–4.0)
Monocytes Absolute: 0.8 10*3/uL (ref 0.1–1.0)
Monocytes Relative: 14 %
NEUTROS ABS: 3 10*3/uL (ref 1.7–7.7)
Neutrophils Relative %: 54 %

## 2018-02-26 LAB — SEDIMENTATION RATE: Sed Rate: 1 mm/hr (ref 0–16)

## 2018-02-26 LAB — TSH: TSH: 1.471 u[IU]/mL (ref 0.350–4.500)

## 2018-02-26 LAB — D-DIMER, QUANTITATIVE (NOT AT ARMC)

## 2018-02-26 LAB — I-STAT TROPONIN, ED: Troponin i, poc: 0.01 ng/mL (ref 0.00–0.08)

## 2018-02-26 LAB — BRAIN NATRIURETIC PEPTIDE: B NATRIURETIC PEPTIDE 5: 13.7 pg/mL (ref 0.0–100.0)

## 2018-02-26 MED ORDER — ACETAMINOPHEN 325 MG PO TABS: 650.0000 mg | ORAL_TABLET | ORAL | Status: DC | PRN

## 2018-02-26 MED ORDER — SODIUM CHLORIDE 0.9% FLUSH: 3.0000 mL | INTRAVENOUS | Status: DC | PRN

## 2018-02-26 MED ORDER — TAMSULOSIN HCL 0.4 MG PO CAPS
0.4000 mg | ORAL_CAPSULE | ORAL | Status: DC | PRN
  Filled 2018-02-26: qty 1

## 2018-02-26 MED ORDER — ONDANSETRON HCL 4 MG/2ML IJ SOLN: 4.0000 mg | Freq: Four times a day (QID) | INTRAMUSCULAR | Status: DC | PRN

## 2018-02-26 MED ORDER — ADULT MULTIVITAMIN W/MINERALS CH
1.0000 | ORAL_TABLET | Freq: Every day | ORAL | Status: DC
  Administered 2018-02-27 – 2018-02-28 (×2): 1 via ORAL
  Filled 2018-02-26 (×2): qty 1

## 2018-02-26 MED ORDER — LORATADINE 10 MG PO TABS
10.0000 mg | ORAL_TABLET | Freq: Two times a day (BID) | ORAL | Status: DC
  Administered 2018-02-26 – 2018-02-28 (×4): 10 mg via ORAL
  Filled 2018-02-26 (×4): qty 1

## 2018-02-26 MED ORDER — ASPIRIN EC 81 MG PO TBEC
81.0000 mg | DELAYED_RELEASE_TABLET | Freq: Every day | ORAL | Status: DC
  Administered 2018-02-27 – 2018-02-28 (×2): 81 mg via ORAL
  Filled 2018-02-26 (×2): qty 1

## 2018-02-26 MED ORDER — IBUPROFEN 200 MG PO TABS: 400.0000 mg | ORAL_TABLET | Freq: Four times a day (QID) | ORAL | Status: DC | PRN

## 2018-02-26 MED ORDER — SODIUM CHLORIDE 0.9% FLUSH
3.0000 mL | Freq: Two times a day (BID) | INTRAVENOUS | Status: DC
  Administered 2018-02-27: 3 mL via INTRAVENOUS

## 2018-02-26 MED ORDER — ALLOPURINOL 300 MG PO TABS
300.0000 mg | ORAL_TABLET | Freq: Every day | ORAL | Status: DC
  Administered 2018-02-27 – 2018-02-28 (×2): 300 mg via ORAL
  Filled 2018-02-26 (×2): qty 1

## 2018-02-26 MED ORDER — ENOXAPARIN SODIUM 40 MG/0.4ML ~~LOC~~ SOLN
40.0000 mg | SUBCUTANEOUS | Status: DC
  Filled 2018-02-26 (×2): qty 0.4

## 2018-02-26 MED ORDER — SODIUM CHLORIDE 0.9 % IV SOLN: 250.0000 mL | INTRAVENOUS | Status: DC | PRN

## 2018-02-26 MED ORDER — NITROGLYCERIN 0.4 MG SL SUBL: 0.4000 mg | SUBLINGUAL_TABLET | SUBLINGUAL | Status: DC | PRN

## 2018-02-26 MED ORDER — FLUTICASONE PROPIONATE 50 MCG/ACT NA SUSP
1.0000 | Freq: Two times a day (BID) | NASAL | Status: DC
  Administered 2018-02-26 – 2018-02-28 (×4): 1 via NASAL
  Filled 2018-02-26: qty 16

## 2018-02-26 MED ORDER — METOPROLOL SUCCINATE ER 25 MG PO TB24
12.5000 mg | ORAL_TABLET | Freq: Every day | ORAL | Status: DC
  Administered 2018-02-27 – 2018-02-28 (×2): 12.5 mg via ORAL
  Filled 2018-02-26 (×4): qty 1

## 2018-02-26 MED ORDER — CLOPIDOGREL BISULFATE 75 MG PO TABS
75.0000 mg | ORAL_TABLET | Freq: Every day | ORAL | Status: DC
  Administered 2018-02-27 – 2018-02-28 (×2): 75 mg via ORAL
  Filled 2018-02-26 (×2): qty 1

## 2018-02-26 NOTE — ED Provider Notes (Signed)
[facility] EMERGENCY DEPARTMENT Provider Note   CSN: [id number] Arrival date & time: 02/26/18  1029     History   Chief Complaint Chief Complaint  Patient presents with  . Weakness  . Dizziness    HPI Timothy Wilcox is a 77 y.o. male.  HPI Patient has known history of coronary artery disease.  He has been developing shortness of breath with exertion.  This has progressed over several weeks such that he is limited by very minor activity that typically would not cause him any distress.  Patient does not have any chest pain, fever, cough.  He is referred to the emergency department for admission and further diagnostic evaluation as an inpatient. Past Medical History:  Diagnosis Date  . Arthritis   . CAD (coronary artery disease) 02/26/2018   Inf STEMI 6/18: LHC - pLCx 60, dRCA 100, EF 45-50 >> PCI:  DES to RCA // Echo 8/18: EF 60-65, normal wall motion, grade 1 diastolic dysfunction, MAC  . Gout   . 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 Hyperlipemia    intol to some statins  . IBS (irritable bowel syndrome)   . Medication intolerance    severe SOB due to Brilinta >> changed to Plavix  . Renal disorder    kidney stones  . Seasonal allergies   . Sinus congestion    chronic  . Sleep apnea    uses a c-pap    Patient Active Problem List   Diagnosis Date Noted  . CAD (coronary artery disease) 02/26/2018  . Hyperlipidemia 05/22/2017  . 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    Past Surgical History:  Procedure Laterality Date  . COLONOSCOPY    . CORONARY STENT INTERVENTION N/A 04/03/2017   Procedure: Coronary Stent Intervention;  Surgeon: Sherren Mocha, MD;  Location: [facility];  Service: Cardiovascular;  Laterality: N/A;  . INCISION AND DRAINAGE  2011   infected finger  . LEFT HEART CATH AND CORONARY ANGIOGRAPHY N/A 04/03/2017   Procedure: Left Heart Cath and Coronary Angiography;  Surgeon: Sherren Mocha, MD;  Location: [place] [facility];  Service: Cardiovascular;  Laterality: N/A;  . SHOULDER ARTHROSCOPY WITH ROTATOR CUFF REPAIR AND SUBACROMIAL DECOMPRESSION Right 01/29/2013   Procedure: RIGHT SHOULDER ARTHROSCOPY WITH SUBACROMIAL DECOMPRESSION, THREE TENDON ROTATOR CUFF REPAIR;  Surgeon: Cammie Sickle., MD;  Location: [place];  Service: Orthopedics;  Laterality: Right;  . TOE DEBRIDEMENT     rt toe cyst        Home Medications    Prior to Admission medications   Medication Sig Start Date End Date Taking? Authorizing Provider  allopurinol (ZYLOPRIM) 300 MG tablet Take 300 mg by mouth daily.    [provider]  aspirin EC 81 MG tablet Take 81 mg by mouth daily.    [provider]  clopidogrel (PLAVIX) 75 MG tablet Take 1 tablet (75 mg total) by mouth daily. 04/30/17   Sherren Mocha, MD  fluticasone [facility]) 50 MCG/ACT nasal spray Place 1 spray into both nostrils 2 (two) times daily.    [provider]  ibuprofen (ADVIL,MOTRIN) 200 MG tablet Take 400 mg by mouth every 6 (six) hours as needed for headache or moderate pain.     [provider]  loratadine (CLARITIN) 10 MG tablet Take 10 mg by mouth 2 (two) times daily.     [provider]  metoprolol succinate (TOPROL XL) 25 MG 24 hr tablet Take 0.5  tablets (12.5 mg total) by mouth daily. 05/09/17 05/09/18  Sherren Mocha, MD  Multiple Vitamin (MULTIVITAMIN WITH MINERALS) TABS Take 1 tablet by mouth daily.    [provider]  nitroGLYCERIN (NITROSTAT) 0.4 MG SL tablet Place 1 tablet (0.4 mg total) under the tongue every 5 (five) minutes x 3 doses as needed for chest pain. 04/05/17   Leanor Kail, PA  Probiotic Product (DAILY PROBIOTIC PO) Take 1 tablet by mouth daily.    [provider]  sildenafil (VIAGRA) 100 MG tablet Take 100 mg by mouth daily as needed for erectile dysfunction.     [provider]  tamsulosin (FLOMAX) 0.4 MG CAPS Take 0.4 mg by mouth as needed  (used if her has a kidney stone). 03/24/12   Hyman Bible, PA-C    Family History History reviewed. No pertinent family history.  Social History Social History   Tobacco Use  . Smoking status: Never Smoker  . Smokeless tobacco: Never Used  Substance Use Topics  . Alcohol use: Yes    Comment: a bottle of win every night  . Drug use: No     Allergies   Brilinta [ticagrelor]; Statins; Dicyclomine; and Tagamet [cimetidine]   Review of Systems Review of Systems 10 Systems reviewed and are negative for acute change except as noted in the HPI.   Physical Exam Updated Vital Signs BP 113/76 (BP Location: Right Arm)   Pulse 68   Temp 97.8 F (36.6 C) (Oral)   SpO2 99%   Physical Exam  Constitutional: He is oriented to person, place, and time. He appears well-developed and well-nourished.  HENT:  Head: Normocephalic and atraumatic.  Eyes: Pupils are equal, round, and reactive to light. EOM are normal.  Neck: Neck supple.  Cardiovascular: Normal rate, regular rhythm, normal heart sounds and intact distal pulses.  Pulmonary/Chest: Effort normal and breath sounds normal.  Abdominal: Soft. Bowel sounds are normal. He exhibits no distension. There is no tenderness.  Musculoskeletal: Normal range of motion. He exhibits no edema or tenderness.  Neurological: He is alert and oriented to person, place, and time. He has normal strength. He exhibits normal muscle tone. Coordination normal. GCS eye subscore is 4. GCS verbal subscore is 5. GCS motor subscore is 6.  Skin: Skin is warm, dry and intact.  Psychiatric: He has a normal mood and affect.     ED Treatments / Results  Labs (all labs ordered are listed, but only abnormal results are displayed) Labs Reviewed  BASIC METABOLIC PANEL - Abnormal; Notable for the following components:      Result Value   Calcium 10.4 (*)    All other components within normal limits  CBC  COMPREHENSIVE METABOLIC PANEL  D-DIMER, QUANTITATIVE  (NOT AT [facility])  BRAIN NATRIURETIC PEPTIDE  URINALYSIS, ROUTINE W REFLEX MICROSCOPIC  SEDIMENTATION RATE  I-STAT TROPONIN, ED    EKG EKG Interpretation  Date/Time:  Wednesday Feb 26 2018 11:25:28 EDT Ventricular Rate:  68 PR Interval:  188 QRS Duration: 88 QT Interval:  392 QTC Calculation: 416 R Axis:   11 Text Interpretation:  Normal sinus rhythm Cannot rule out Anterior infarct , age undetermined Abnormal ECG no change Confirmed by Charlesetta Shanks [phone]) on 02/26/2018 1:21:26 PM   Radiology Dg Chest 2 View  Result Date: 02/26/2018 CLINICAL DATA:  Shortness of breath and dizziness EXAM: CHEST - 2 VIEW COMPARISON:  None. FINDINGS: There is minimal scarring in the left base. There is no edema or consolidation. The heart size and pulmonary  vascularity are normal. No adenopathy. There is aortic atherosclerosis. There is mild degenerative change in the thoracic spine. IMPRESSION: Aortic atherosclerosis. No edema or consolidation. Minimal scarring left base. Aortic Atherosclerosis (ICD10-I70.0). Electronically Signed   By: Lowella Grip III M.D.   On: 02/26/2018 12:16    Procedures Procedures (including critical care time)  Medications Ordered in ED Medications - No data to display   Initial Impression / Assessment and Plan / ED Course  I have reviewed the triage vital signs and the nursing notes.  Pertinent labs & imaging results that were available during my care of the patient were reviewed by me and considered in my medical decision making (see chart for details).      Final Clinical Impressions(s) / ED Diagnoses   Final diagnoses:  Dyspnea on exertion History of coronary artery disease    Patient presents as outlined above referred from outpatient cardiology.  Patient does not have active chest pain at this time.  Vital signs are stable.  Diagnostic studies reviewed.  Patient to be admitted as per cardiology plan. ED Discharge Orders    None       Charlesetta Shanks, MD 03/02/18 [phone]

## 2018-02-26 NOTE — H&P (Addendum)
Cardiology Admission History and Physical:   Patient ID: Timothy Wilcox; MRN: [id number]; DOB: 02-01-1941   Admission date: 02/26/2018  Primary Care Provider: Hulan Fess, MD Primary Cardiologist: Sherren Mocha, MD   Chief Complaint:  Shortness of breath  Patient Profile:   Timothy Wilcox is a 77 y.o. male with a history of coronary artery disease s/p inferior MI in 2018 who presents to the office today with rapidly worsening shortness of breath.  History of Present Illness:    Timothy Wilcox is a 77 y.o. male with coronary artery disease status post inferior ST elevation myocardial infarction in June 2018 treated with a DES to the distal RCA, hyperlipidemia, sleep apnea.  He had significant side effects to Ticagrelor prompting transition to Clopidogrel.  He was last seen by Dr. Burt Knack 05/09/2017.  Has been evaluated by the lipid clinic due to intolerance to statin therapy.  Timothy Wilcox returns to the office today for follow up.  Over the last 2 weeks, he has developed significant dyspnea on exertion with minimal activity.  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  Now, he cannot walk across a room without getting short of breath.  He denies chest pain, arm pain, jaw pain.  He denies syncope but has been lightheaded.  He denies orthopnea, paroxysmal nocturnal dyspnea, edema, weight gain.  He is visibly short of breath just talking to me.   Prior CV studies:   The following studies were reviewed today:  Echo 05/22/2017 EF 60-65, normal wall motion, grade 1 diastolic dysfunction, MAC  Cardiac catheterization 04/03/2017 LAD luminal irregularities LCx proximal 60 RCA distal 100 EF 45-50, inferior hypokinesis PCI: 2.75 x 16 mm Promus DES to the distal RCA  Past Medical History:  Diagnosis Date  . Arthritis   . CAD (coronary artery disease) 02/26/2018   Inf STEMI 6/18: LHC - pLCx 60, dRCA 100, EF 45-50 >> PCI:  DES to RCA // Echo 8/18: EF 60-65,  normal wall motion, grade 1 diastolic dysfunction, MAC  . Gout   . 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 Hyperlipemia    intol to some statins  . IBS (irritable bowel syndrome)   . Medication intolerance    severe SOB due to Brilinta >> changed to Plavix  . Renal disorder    kidney stones  . Seasonal allergies   . Sinus congestion    chronic  . Sleep apnea    uses a c-pap   Surgical Hx: The patient  has a past surgical history that includes Colonoscopy; Incision and drainage (2011); Toe Debridement; Shoulder arthroscopy with rotator cuff repair and subacromial decompression (Right, 01/29/2013); LEFT HEART CATH AND CORONARY ANGIOGRAPHY (N/A, 04/03/2017); and CORONARY STENT INTERVENTION (N/A, 04/03/2017).   Current Medications: Current Meds  Medication Sig  . allopurinol (ZYLOPRIM) 300 MG tablet Take 300 mg by mouth daily.  Marland Kitchen aspirin EC 81 MG tablet Take 81 mg by mouth daily.  . clopidogrel (PLAVIX) 75 MG tablet Take 1 tablet (75 mg total) by mouth daily.  . fluticasone (FLONASE) 50 MCG/ACT nasal spray Place 1 spray into both nostrils 2 (two) times daily.  Marland Kitchen ibuprofen (ADVIL,MOTRIN) 200 MG tablet Take 400 mg by mouth every 6 (six) hours as needed for headache or moderate pain.   Marland Kitchen loratadine (CLARITIN) 10 MG tablet Take 10 mg by mouth 2 (two) times daily.   . metoprolol succinate (TOPROL XL) 25 MG 24 hr tablet Take 0.5 tablets (12.5 mg  total) by mouth daily.  . Multiple Vitamin (MULTIVITAMIN WITH MINERALS) TABS Take 1 tablet by mouth daily.  . nitroGLYCERIN (NITROSTAT) 0.4 MG SL tablet Place 1 tablet (0.4 mg total) under the tongue every 5 (five) minutes x 3 doses as needed for chest pain.  . Probiotic Product (DAILY PROBIOTIC PO) Take 1 tablet by mouth daily.  . sildenafil (VIAGRA) 100 MG tablet Take 100 mg by mouth daily as needed for erectile dysfunction.   . tamsulosin (FLOMAX) 0.4 MG CAPS Take 0.4 mg by mouth as needed (used if her has a kidney stone).     Allergies:    Brilinta [ticagrelor]; Statins; Dicyclomine; and Tagamet [cimetidine]   Social History   Tobacco Use  . Smoking status: Never Smoker  . Smokeless tobacco: Never Used  Substance Use Topics  . Alcohol use: Yes    Comment: occ  . Drug use: No     Family Hx: The patient's family history is not on file.  ROS:   Please see the history of present illness.    Review of Systems  Constitution: Positive for malaise/fatigue.  Cardiovascular: Positive for chest pain.  Respiratory: Positive for shortness of breath.   Neurological: Positive for dizziness.   All other systems reviewed and are negative.   EKGs/Labs/Other Test Reviewed:    EKG:  EKG is  ordered today.  The ekg ordered today demonstrates normal sinus rhythm, heart rate 74, normal axis, QTC 435  Recent Labs: 04/03/2017: ALT 38 04/04/2017: BUN 16; Creatinine, Ser 1.01; Hemoglobin 12.6; Platelets 185; Potassium 4.0; Sodium 138   Recent Lipid Panel Lab Results  Component Value Date/Time   CHOL 165 07/15/2017 10:03 AM   TRIG 233 (H) 07/15/2017 10:03 AM   HDL 63 07/15/2017 10:03 AM   CHOLHDL 2.6 07/15/2017 10:03 AM   CHOLHDL 4.0 04/04/2017 12:08 AM   LDLCALC 55 07/15/2017 10:03 AM    Physical Exam:    VS:  BP 114/62   Pulse 74   Ht _0  (1.702 m)   Wt 216 lb (98 kg)   SpO2 99%   BMI 33.83 kg/m     Wt Readings from Last 3 Encounters:  02/26/18 216 lb (98 kg)  07/31/17 210 lb 8.6 oz (95.5 kg)  05/23/17 229 lb 4.5 oz (104 kg)     Physical Exam  Constitutional: He is oriented to person, place, and time. He appears well-developed and well-nourished. No distress.  HENT:  Head: Normocephalic and atraumatic.  Neck: Neck supple. No JVD present.  Cardiovascular: Normal rate, regular rhythm, S1 normal and S2 normal.  No murmur heard. Pulmonary/Chest: Breath sounds normal. He has no rales.  Abdominal: Soft. There is no hepatomegaly.  Musculoskeletal: He exhibits no edema.  Neurological: He is alert and oriented to  person, place, and time.  Skin: Skin is warm and dry.    ASSESSMENT & PLAN:    Shortness of breath Timothy Wilcox presents today with significant shortness of breath with just minimal activity.  As noted he does get short of breath which is talking to me.  He tells me that this is similar to what he had when he was on Brilinta.  However, he has been off of Brilinta for several months.  He denies chest discomfort like he had with his myocardial infarction.  He is not tachycardic.  His oxygen saturation is normal on room air.  He does not display any signs of volume excess.  Echocardiogram in August 2018 demonstrated normal LV function with  mild diastolic dysfunction.  He is low risk for pulmonary embolism according to Wells criteria.  He was also seen by Dr. Rayann Heman today.  -Admit to [facility]  -I recommended going by ambulance but he prefers to go by private vehicle  -Obtain d-dimer; proceed with chest CTA if positive  -Obtain CMET, CBC, TSH, BNP  -Obtain chest x-ray  -Obtain 2D echocardiogram  -If no other etiology for shortness of breath noted, proceed with R/L cardiac cath 5/23  Coronary artery disease involving native coronary artery of native heart without angina pectoris  History of inferior MI in 2018 treated with DES to the distal RCA.  He had residual proximal LCx stenosis of 60%.  As noted, he presents to the office today with significant shortness of breath etiology for his shortness of breath is not entirely clear.  He had clear chest pain with his myocardial infarction in the past.  His ECG is normal.  He will be admitted to the hospital for further evaluation.  We will obtain labs to rule out the possibility of anemia, pulmonary embolism, CHF.  I will obtain a chest x-ray and echocardiogram.  If work-up is unrevealing, he will undergo R/L cardiac cath tomorrow.  Hyperlipidemia, unspecified hyperlipidemia type He is intolerant of statins.  If he has progressive CAD requiring PCI on  cardiac catheterization, we should discharge him on Zetia 10 mg daily.  Signed, Timothy Dopp, PA-C  02/26/2018 10:18 AM    Timothy Wilcox [address], [place], [place]  [address] Phone: [phone]; Fax: [phone]    Severity of Illness: The appropriate patient status for this patient is OBSERVATION. Observation status is judged to be reasonable and necessary in order to provide the required intensity of service to ensure the patient's safety. The patient's presenting symptoms, physical exam findings, and initial radiographic and laboratory data in the context of their medical condition is felt to place them at decreased risk for further clinical deterioration. Furthermore, it is anticipated that the patient will be medically stable for discharge from the hospital within 2 midnights of admission. The following factors support the patient status of observation.   " The patient's presenting symptoms include shortness of breath. " The physical exam findings include n/a. " The initial radiographic and laboratory data are n/a.   For questions or updates, please contact [facility] Please consult [email or web address] for contact info under Cardiology/STEMI.    Signed, Timothy Dopp, PA-C  02/26/2018 11:09 AM    I have seen, examined the patient, and reviewed the above assessment and plan.  Changes to above are made where necessary.  On exam, RRR.  He is not wheezing and does not appear volume overloaded.  He is clearly tachypneac however and I worry that he may have significant pathology.  Given negative d Dimer, troponin, BNP, I worry that his SOB may be due to ACS.  I would advise admission to [facility] for cardiology evaluation/ management.  I would advise right and left heart catheterization tomorrow to further evaluate his acute progressive dyspnea.   The patient is currently quite ill.  He is at risk for acute decompensation. A high level of decision making was  required for this encounter.  Co Sign: Timothy Grayer, MD 02/26/2018 4:41 PM

## 2018-02-26 NOTE — Patient Instructions (Signed)
You have been admitted to [facility].

## 2018-02-26 NOTE — Progress Notes (Signed)
Cardiology Office Note:    Date:  02/26/2018   ID:  Timothy Wilcox Feb 27, 1941, MRN [id number]  PCP:  Timothy Fess, MD  Cardiologist:  Timothy Mocha, MD   Referring MD: Timothy Fess, MD   Chief Complaint  Patient presents with  . Shortness of Breath    History of Present Illness:    Timothy Wilcox is a 77 y.o. male with coronary artery disease status post inferior ST elevation myocardial infarction in June 2018 treated with a DES to the distal RCA, hyperlipidemia, sleep apnea.  He had significant side effects to Ticagrelor prompting transition to Clopidogrel.  He was last seen by Dr. Burt Wilcox 05/09/2017.  Has been evaluated by the lipid clinic due to intolerance to statin therapy.  Timothy Wilcox returns to the office today for follow up.  Over the last 2 weeks, he has developed significant dyspnea on exertion with minimal activity.  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  Now, he cannot walk across a room without getting short of breath.  He denies chest pain, arm pain, jaw pain.  He denies syncope but has been lightheaded.  He denies orthopnea, paroxysmal nocturnal dyspnea, edema, weight gain.  He is visibly short of breath just talking to me.   Prior CV studies:   The following studies were reviewed today:  Echo 05/22/2017 EF 60-65, normal wall motion, grade 1 diastolic dysfunction, MAC  Cardiac catheterization 04/03/2017 LAD luminal irregularities LCx proximal 60 RCA distal 100 EF 45-50, inferior hypokinesis PCI: 2.75 x 16 mm Promus DES to the distal RCA  Past Medical History:  Diagnosis Date  . Arthritis   . CAD (coronary artery disease) 02/26/2018   Inf STEMI 6/18: LHC - pLCx 60, dRCA 100, EF 45-50 >> PCI:  DES to RCA // Echo 8/18: EF 60-65, normal wall motion, grade 1 diastolic dysfunction, MAC  . Gout   . 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 Hyperlipemia    intol to some statins  . IBS (irritable bowel syndrome)   .  Medication intolerance    severe SOB due to Brilinta >> changed to Plavix  . Renal disorder    kidney stones  . Seasonal allergies   . Sinus congestion    chronic  . Sleep apnea    uses a c-pap   Surgical Hx: The patient  has a past surgical history that includes Colonoscopy; Incision and drainage (2011); Toe Debridement; Shoulder arthroscopy with rotator cuff repair and subacromial decompression (Right, 01/29/2013); LEFT HEART CATH AND CORONARY ANGIOGRAPHY (N/A, 04/03/2017); and CORONARY STENT INTERVENTION (N/A, 04/03/2017).   Current Medications: Current Meds  Medication Sig  . allopurinol (ZYLOPRIM) 300 MG tablet Take 300 mg by mouth daily.  Marland Kitchen aspirin EC 81 MG tablet Take 81 mg by mouth daily.  . clopidogrel (PLAVIX) 75 MG tablet Take 1 tablet (75 mg total) by mouth daily.  . fluticasone (FLONASE) 50 MCG/ACT nasal spray Place 1 spray into both nostrils 2 (two) times daily.  Marland Kitchen ibuprofen (ADVIL,MOTRIN) 200 MG tablet Take 400 mg by mouth every 6 (six) hours as needed for headache or moderate pain.   Marland Kitchen loratadine (CLARITIN) 10 MG tablet Take 10 mg by mouth 2 (two) times daily.   . metoprolol succinate (TOPROL XL) 25 MG 24 hr tablet Take 0.5 tablets (12.5 mg total) by mouth daily.  . Multiple Vitamin (MULTIVITAMIN WITH MINERALS) TABS Take 1 tablet by mouth daily.  . nitroGLYCERIN (NITROSTAT) 0.4 MG  SL tablet Place 1 tablet (0.4 mg total) under the tongue every 5 (five) minutes x 3 doses as needed for chest pain.  . Probiotic Product (DAILY PROBIOTIC PO) Take 1 tablet by mouth daily.  . sildenafil (VIAGRA) 100 MG tablet Take 100 mg by mouth daily as needed for erectile dysfunction.   . tamsulosin (FLOMAX) 0.4 MG CAPS Take 0.4 mg by mouth as needed (used if her has a kidney stone).     Allergies:   Brilinta [ticagrelor]; Statins; Dicyclomine; and Tagamet [cimetidine]   Social History   Tobacco Use  . Smoking status: Never Smoker  . Smokeless tobacco: Never Used  Substance Use Topics  .  Alcohol use: Yes    Comment: occ  . Drug use: No     Family Hx: The patient's family history is not on file.  ROS:   Please see the history of present illness.    Review of Systems  Constitution: Positive for malaise/fatigue.  Cardiovascular: Positive for chest pain.  Respiratory: Positive for shortness of breath.   Neurological: Positive for dizziness.   All other systems reviewed and are negative.   EKGs/Labs/Other Test Reviewed:    EKG:  EKG is  ordered today.  The ekg ordered today demonstrates normal sinus rhythm, heart rate 74, normal axis, QTC 435  Recent Labs: 04/03/2017: ALT 38 04/04/2017: BUN 16; Creatinine, Ser 1.01; Hemoglobin 12.6; Platelets 185; Potassium 4.0; Sodium 138   Recent Lipid Panel Lab Results  Component Value Date/Time   CHOL 165 07/15/2017 10:03 AM   TRIG 233 (H) 07/15/2017 10:03 AM   HDL 63 07/15/2017 10:03 AM   CHOLHDL 2.6 07/15/2017 10:03 AM   CHOLHDL 4.0 04/04/2017 12:08 AM   LDLCALC 55 07/15/2017 10:03 AM    Physical Exam:    VS:  BP 114/62   Pulse 74   Ht _0  (1.702 m)   Wt 216 lb (98 kg)   SpO2 99%   BMI 33.83 kg/m     Wt Readings from Last 3 Encounters:  02/26/18 216 lb (98 kg)  07/31/17 210 lb 8.6 oz (95.5 kg)  05/23/17 229 lb 4.5 oz (104 kg)     Physical Exam  Constitutional: He is oriented to person, place, and time. He appears well-developed and well-nourished. No distress.  HENT:  Head: Normocephalic and atraumatic.  Neck: Neck supple. No JVD present.  Cardiovascular: Normal rate, regular rhythm, S1 normal and S2 normal.  No murmur heard. Pulmonary/Chest: Breath sounds normal. He has no rales.  Abdominal: Soft. There is no hepatomegaly.  Musculoskeletal: He exhibits no edema.  Neurological: He is alert and oriented to person, place, and time.  Skin: Skin is warm and dry.    ASSESSMENT & PLAN:    Shortness of breath Timothy Wilcox presents today with significant shortness of breath with just minimal activity.   As noted he does get short of breath which is talking to me.  He tells me that this is similar to what he had when he was on Brilinta.  However, he has been off of Brilinta for several months.  He denies chest discomfort like he had with his myocardial infarction.  He is not tachycardic.  His oxygen saturation is normal on room air.  He does not display any signs of volume excess.  Echocardiogram in August 2018 demonstrated normal LV function with mild diastolic dysfunction.  He is low risk for pulmonary embolism according to Wells criteria.  He was also seen by Dr. Rayann Heman today.  -  Admit to [facility]  -I recommended going by ambulance but he prefers to go by private vehicle  -Obtain d-dimer; proceed with chest CTA if positive  -Obtain CMET, CBC, TSH, BNP  -Obtain chest x-ray  -Obtain 2D echocardiogram  -If no other etiology for shortness of breath noted, proceed with R/L cardiac cath 5/23  Coronary artery disease involving native coronary artery of native heart without angina pectoris  History of inferior MI in 2018 treated with DES to the distal RCA.  He had residual proximal LCx stenosis of 60%.  As noted, he presents to the office today with significant shortness of breath etiology for his shortness of breath is not entirely clear.  He had clear chest pain with his myocardial infarction in the past.  His ECG is normal.  He will be admitted to the hospital for further evaluation.  We will obtain labs to rule out the possibility of anemia, pulmonary embolism, CHF.  I will obtain a chest x-ray and echocardiogram.  If work-up is unrevealing, he will undergo R/L cardiac cath tomorrow.  Hyperlipidemia, unspecified hyperlipidemia type He is intolerant of statins.  If he has progressive CAD requiring PCI on cardiac catheterization, we should discharge him on Zetia 10 mg daily.   Dispo:  Return for Follow up after DC from hospital.   Medication Adjustments/Labs and Tests Ordered: Current medicines are  reviewed at length with the patient today.  Concerns regarding medicines are outlined above.  Tests Ordered: Orders Placed This Encounter  Procedures  . EKG 12-Lead   Medication Changes: No orders of the defined types were placed in this encounter.   Signed, Richardson Dopp, PA-C  02/26/2018 10:18 AM    [facility] [place], [place], [place]  [address] Phone: [phone]; Fax: [phone]

## 2018-02-26 NOTE — ED Triage Notes (Signed)
Pt sent by [facility] for dizziness, weakness, no energy, and shob since Friday. Pt has hx of heart blockage x 1 year ago and this feels the same. Denies CP. VSS

## 2018-02-27 ENCOUNTER — Encounter (HOSPITAL_COMMUNITY)
Admission: EM | Disposition: A | Discharge status: Home / Self Care | Payer: Self-pay | Source: Home / Self Care | Attending: Internal Medicine

## 2018-02-27 ENCOUNTER — Observation Stay (HOSPITAL_BASED_OUTPATIENT_CLINIC_OR_DEPARTMENT_OTHER): Payer: Medicare HMO

## 2018-02-27 DIAGNOSIS — M1990 Unspecified osteoarthritis, unspecified site: Secondary | ICD-10-CM | POA: Diagnosis present

## 2018-02-27 DIAGNOSIS — M109 Gout, unspecified: Secondary | ICD-10-CM | POA: Diagnosis present

## 2018-02-27 DIAGNOSIS — Z888 Allergy status to other drugs, medicaments and biological substances status: Secondary | ICD-10-CM | POA: Diagnosis not present

## 2018-02-27 DIAGNOSIS — I252 Old myocardial infarction: Secondary | ICD-10-CM | POA: Diagnosis not present

## 2018-02-27 DIAGNOSIS — I209 Angina pectoris, unspecified: Secondary | ICD-10-CM

## 2018-02-27 DIAGNOSIS — K589 Irritable bowel syndrome without diarrhea: Secondary | ICD-10-CM | POA: Diagnosis not present

## 2018-02-27 DIAGNOSIS — I249 Acute ischemic heart disease, unspecified: Secondary | ICD-10-CM | POA: Diagnosis not present

## 2018-02-27 DIAGNOSIS — Z7982 Long term (current) use of aspirin: Secondary | ICD-10-CM | POA: Diagnosis not present

## 2018-02-27 DIAGNOSIS — R0602 Shortness of breath: Secondary | ICD-10-CM | POA: Diagnosis present

## 2018-02-27 DIAGNOSIS — Z955 Presence of coronary angioplasty implant and graft: Secondary | ICD-10-CM | POA: Diagnosis not present

## 2018-02-27 DIAGNOSIS — Z7902 Long term (current) use of antithrombotics/antiplatelets: Secondary | ICD-10-CM | POA: Diagnosis not present

## 2018-02-27 DIAGNOSIS — R42 Dizziness and giddiness: Secondary | ICD-10-CM | POA: Diagnosis present

## 2018-02-27 DIAGNOSIS — G4730 Sleep apnea, unspecified: Secondary | ICD-10-CM | POA: Diagnosis not present

## 2018-02-27 DIAGNOSIS — E785 Hyperlipidemia, unspecified: Secondary | ICD-10-CM | POA: Diagnosis not present

## 2018-02-27 DIAGNOSIS — I25118 Atherosclerotic heart disease of native coronary artery with other forms of angina pectoris: Secondary | ICD-10-CM | POA: Diagnosis not present

## 2018-02-27 DIAGNOSIS — Z7951 Long term (current) use of inhaled steroids: Secondary | ICD-10-CM | POA: Diagnosis not present

## 2018-02-27 DIAGNOSIS — Z87442 Personal history of urinary calculi: Secondary | ICD-10-CM | POA: Diagnosis not present

## 2018-02-27 HISTORY — PX: CORONARY STENT INTERVENTION: CATH118234

## 2018-02-27 HISTORY — PX: LEFT HEART CATH AND CORONARY ANGIOGRAPHY: CATH118249

## 2018-02-27 LAB — TROPONIN I: Troponin I: <0.03 ng/mL (ref <0.03)

## 2018-02-27 LAB — LIPID PANEL
CHOL/HDL RATIO: 3.3 ratio
Cholesterol: 148 mg/dL (ref 0–200)
HDL: 45 mg/dL (ref >40)
LDL CALC: 61 mg/dL (ref 0–99)
TRIGLYCERIDES: 208 mg/dL — AB (ref <150)
VLDL: 42 mg/dL — AB (ref 0–40)

## 2018-02-27 LAB — CBC
HCT: 40.7 % (ref 39.0–52.0)
Hemoglobin: 13.6 g/dL (ref 13.0–17.0)
MCH: 32.1 pg (ref 26.0–34.0)
MCHC: 33.4 g/dL (ref 30.0–36.0)
MCV: 96 fL (ref 78.0–100.0)
PLATELETS: 209 10*3/uL (ref 150–400)
RBC: 4.24 MIL/uL (ref 4.22–5.81)
RDW: 12.7 % (ref 11.5–15.5)
WBC: 5.7 10*3/uL (ref 4.0–10.5)

## 2018-02-27 LAB — POCT ACTIVATED CLOTTING TIME
ACTIVATED CLOTTING TIME: 290 s
Activated Clotting Time: 279 seconds

## 2018-02-27 LAB — ECHOCARDIOGRAM COMPLETE
Height: 67 in
Weight: 3416 oz

## 2018-02-27 LAB — CREATININE, SERUM
CREATININE: 0.69 mg/dL (ref 0.61–1.24)
GFR calc Af Amer: >60 mL/min (ref >60)
GFR calc non Af Amer: >60 mL/min (ref >60)

## 2018-02-27 SURGERY — LEFT HEART CATH AND CORONARY ANGIOGRAPHY
Anesthesia: LOCAL

## 2018-02-27 MED ORDER — VERAPAMIL HCL 2.5 MG/ML IV SOLN
INTRAVENOUS | Status: DC | PRN
  Administered 2018-02-27: 10 mL via INTRA_ARTERIAL

## 2018-02-27 MED ORDER — CLOPIDOGREL BISULFATE 300 MG PO TABS
ORAL_TABLET | ORAL | Status: DC | PRN
  Administered 2018-02-27: 75 mg via ORAL

## 2018-02-27 MED ORDER — SODIUM CHLORIDE 0.9 % IV SOLN: 250.0000 mL | INTRAVENOUS | Status: DC | PRN

## 2018-02-27 MED ORDER — SODIUM CHLORIDE 0.9% FLUSH: 3.0000 mL | INTRAVENOUS | Status: DC | PRN

## 2018-02-27 MED ORDER — VERAPAMIL HCL 2.5 MG/ML IV SOLN
INTRAVENOUS | Status: AC
  Filled 2018-02-27: qty 2

## 2018-02-27 MED ORDER — MIDAZOLAM HCL 2 MG/2ML IJ SOLN
INTRAMUSCULAR | Status: DC | PRN
  Administered 2018-02-27: 1 mg via INTRAVENOUS
  Administered 2018-02-27: 2 mg via INTRAVENOUS

## 2018-02-27 MED ORDER — LIDOCAINE HCL (PF) 1 % IJ SOLN
INTRAMUSCULAR | Status: AC
  Filled 2018-02-27: qty 30

## 2018-02-27 MED ORDER — MIDAZOLAM HCL 2 MG/2ML IJ SOLN
INTRAMUSCULAR | Status: AC
  Filled 2018-02-27: qty 2

## 2018-02-27 MED ORDER — ASPIRIN 81 MG PO CHEW: 81.0000 mg | CHEWABLE_TABLET | Freq: Every day | ORAL | Status: DC

## 2018-02-27 MED ORDER — SODIUM CHLORIDE 0.9 % IV SOLN
INTRAVENOUS | Status: DC
  Administered 2018-02-27: 15:00:00 via INTRAVENOUS

## 2018-02-27 MED ORDER — DIAZEPAM 5 MG PO TABS: 5.0000 mg | ORAL_TABLET | Freq: Four times a day (QID) | ORAL | Status: DC | PRN

## 2018-02-27 MED ORDER — SODIUM CHLORIDE 0.9% FLUSH
3.0000 mL | Freq: Two times a day (BID) | INTRAVENOUS | Status: DC
  Administered 2018-02-27 – 2018-02-28 (×2): 3 mL via INTRAVENOUS

## 2018-02-27 MED ORDER — LIDOCAINE HCL (PF) 1 % IJ SOLN
INTRAMUSCULAR | Status: DC | PRN
  Administered 2018-02-27: 2 mL via SUBCUTANEOUS

## 2018-02-27 MED ORDER — CLOPIDOGREL BISULFATE 75 MG PO TABS
ORAL_TABLET | ORAL | Status: AC
  Filled 2018-02-27: qty 1

## 2018-02-27 MED ORDER — NITROGLYCERIN 1 MG/10 ML FOR IR/CATH LAB
INTRA_ARTERIAL | Status: DC | PRN
  Administered 2018-02-27 (×3): 200 ug via INTRACORONARY

## 2018-02-27 MED ORDER — HEPARIN (PORCINE) IN NACL 1000-0.9 UT/500ML-% IV SOLN
INTRAVENOUS | Status: AC
  Filled 2018-02-27: qty 500

## 2018-02-27 MED ORDER — SODIUM CHLORIDE 0.9 % WEIGHT BASED INFUSION: 1.0000 mL/kg/h | INTRAVENOUS | Status: DC

## 2018-02-27 MED ORDER — HEPARIN SODIUM (PORCINE) 5000 UNIT/ML IJ SOLN
5000.0000 [IU] | Freq: Three times a day (TID) | INTRAMUSCULAR | Status: DC
Start: 2018-02-28 — End: 2018-02-27

## 2018-02-27 MED ORDER — FENTANYL CITRATE (PF) 100 MCG/2ML IJ SOLN
INTRAMUSCULAR | Status: AC
  Filled 2018-02-27: qty 2

## 2018-02-27 MED ORDER — HEPARIN SODIUM (PORCINE) 1000 UNIT/ML IJ SOLN
INTRAMUSCULAR | Status: DC | PRN
  Administered 2018-02-27 (×2): 5000 [IU] via INTRAVENOUS

## 2018-02-27 MED ORDER — FENTANYL CITRATE (PF) 100 MCG/2ML IJ SOLN
INTRAMUSCULAR | Status: DC | PRN
  Administered 2018-02-27 (×3): 25 ug via INTRAVENOUS

## 2018-02-27 MED ORDER — LABETALOL HCL 5 MG/ML IV SOLN: 10.0000 mg | INTRAVENOUS | Status: AC | PRN

## 2018-02-27 MED ORDER — ACETAMINOPHEN 325 MG PO TABS: 650.0000 mg | ORAL_TABLET | ORAL | Status: DC | PRN

## 2018-02-27 MED ORDER — HEPARIN (PORCINE) IN NACL 2-0.9 UNITS/ML
INTRAMUSCULAR | Status: AC | PRN
  Administered 2018-02-27 (×2): 500 mL

## 2018-02-27 MED ORDER — ONDANSETRON HCL 4 MG/2ML IJ SOLN: 4.0000 mg | Freq: Four times a day (QID) | INTRAMUSCULAR | Status: DC | PRN

## 2018-02-27 MED ORDER — HYDRALAZINE HCL 20 MG/ML IJ SOLN
5.0000 mg | INTRAMUSCULAR | Status: AC | PRN
Start: 2018-02-27 — End: 2018-02-27

## 2018-02-27 MED ORDER — SODIUM CHLORIDE 0.9% FLUSH
3.0000 mL | Freq: Two times a day (BID) | INTRAVENOUS | Status: DC
  Administered 2018-02-27: 3 mL via INTRAVENOUS

## 2018-02-27 MED ORDER — ANGIOPLASTY BOOK
Freq: Once | Status: AC
  Administered 2018-02-27: 22:00:00
  Filled 2018-02-27: qty 1

## 2018-02-27 MED ORDER — IOHEXOL 350 MG/ML SOLN
INTRAVENOUS | Status: DC | PRN
  Administered 2018-02-27: 175 mL via INTRA_ARTERIAL

## 2018-02-27 MED ORDER — SODIUM CHLORIDE 0.9 % WEIGHT BASED INFUSION
3.0000 mL/kg/h | INTRAVENOUS | Status: DC
  Administered 2018-02-27: 3 mL/kg/h via INTRAVENOUS

## 2018-02-27 MED ORDER — CLOPIDOGREL BISULFATE 75 MG PO TABS: 75.0000 mg | ORAL_TABLET | Freq: Every day | ORAL | Status: DC

## 2018-02-27 SURGICAL SUPPLY — 22 items
BALLN EMERGE MR 2.5X12 (BALLOONS) ×2
BALLN ~~LOC~~ EMERGE MR 3.25X12 (BALLOONS) ×2
BALLOON EMERGE MR 2.5X12 (BALLOONS) IMPLANT
BALLOON ~~LOC~~ EMERGE MR 3.25X12 (BALLOONS) IMPLANT
CATH INFINITI 5FR ANG PIGTAIL (CATHETERS) ×1 IMPLANT
CATH INFINITI JR4 5F (CATHETERS) ×1 IMPLANT
CATH OPTITORQUE TIG 4.0 5F (CATHETERS) ×1 IMPLANT
CATH VISTA GUIDE 6FR XB3.5 (CATHETERS) ×1 IMPLANT
DEVICE RAD COMP TR BAND LRG (VASCULAR PRODUCTS) ×1 IMPLANT
GUIDEWIRE INQWIRE 1.5J.035X260 (WIRE) IMPLANT
INQWIRE 1.5J .035X260CM (WIRE) ×2
KIT ENCORE 26 ADVANTAGE (KITS) ×1 IMPLANT
KIT HEART LEFT (KITS) ×2 IMPLANT
NDL PERC 21GX4CM (NEEDLE) IMPLANT
NEEDLE PERC 21GX4CM (NEEDLE) ×2 IMPLANT
PACK CARDIAC CATHETERIZATION (CUSTOM PROCEDURE TRAY) ×2 IMPLANT
SHEATH RAIN RADIAL 21G 6FR (SHEATH) ×1 IMPLANT
STENT SVELTE  RX 3.00 X 13MM (Permanent Stent) ×1 IMPLANT
SYR MEDRAD MARK V 150ML (SYRINGE) ×2 IMPLANT
TRANSDUCER W/STOPCOCK (MISCELLANEOUS) ×2 IMPLANT
TUBING CIL FLEX 10 FLL-RA (TUBING) ×2 IMPLANT
WIRE ASAHI PROWATER 180CM (WIRE) ×1 IMPLANT

## 2018-02-27 NOTE — Research (Addendum)
OPTIMIZE Informed Consent   Subject Name: Timothy Wilcox  Subject met inclusion and exclusion criteria.  The informed consent form, study requirements and expectations were reviewed with the subject and questions and concerns were addressed prior to the signing of the consent form.  The subject verbalized understanding of the trail requirements.  The subject agreed to participate in the OPTIMIZE trial and signed the informed consent.  The informed consent was obtained prior to performance of any protocol-specific procedures for the subject.  A copy of the signed informed consent was given to the subject and a copy was placed in the subject's medical record. Applicable if randomized.  Hedrick,Lilou Kneip W 02/27/2018, 11:30

## 2018-02-27 NOTE — H&P (View-Only) (Signed)
Progress Note  Patient Name: Timothy Wilcox Date of Encounter: 02/27/2018  Primary Cardiologist: Sherren Mocha, MD   Subjective   Still short of breath with walking. Symptoms progressive x 2 weeks. No chest pain.  Inpatient Medications    Scheduled Meds: . allopurinol  300 mg Oral Daily  . aspirin EC  81 mg Oral Daily  . clopidogrel  75 mg Oral Daily  . enoxaparin (LOVENOX) injection  40 mg Subcutaneous Q24H  . fluticasone  1 spray Each Nare BID  . loratadine  10 mg Oral BID  . metoprolol succinate  12.5 mg Oral Daily  . multivitamin with minerals  1 tablet Oral Daily  . sodium chloride flush  3 mL Intravenous Q12H   Continuous Infusions: . sodium chloride     PRN Meds: sodium chloride, acetaminophen, ibuprofen, nitroGLYCERIN, ondansetron (ZOFRAN) IV, sodium chloride flush, tamsulosin   Vital Signs    Vitals:   02/26/18 1707 02/26/18 2015 02/26/18 2100 02/27/18 0634  BP: 123/78 (!) 199/66  110/64  Pulse: 74 74  63  Resp: 19     Temp: 98.2 F (36.8 C) 98 F (36.7 C)  97.7 F (36.5 C)  TempSrc: Oral Oral  Oral  SpO2: 98% 96%  100%  Weight: 214 lb 14.4 oz (97.5 kg)   213 lb 8 oz (96.8 kg)  Height:   5\' 7"  (1.702 m)     Intake/Output Summary (Last 24 hours) at 02/27/2018 0917 Last data filed at 02/26/2018 2100 Gross per 24 hour  Intake 240 ml  Output -  Net 240 ml   Filed Weights   02/26/18 1707 02/27/18 0634  Weight: 214 lb 14.4 oz (97.5 kg) 213 lb 8 oz (96.8 kg)    Telemetry    NSR, no arrhythmia - Personally Reviewed  ECG    NSR, within normal limits - Personally Reviewed  Physical Exam  Alert, oriented male, mildly dyspneic with talking GEN: No acute distress.   Neck: No JVD Cardiac: RRR, no murmurs, rubs, or gallops.  Respiratory: Clear to auscultation bilaterally. GI: Soft, nontender, non-distended MS: No edema; No deformity. Neuro:  Nonfocal  Psych: Normal affect   Labs    Chemistry Recent Labs  Lab 02/26/18 1135  02/26/18 1415  NA 140 138  K 4.7 4.4  CL 105 105  CO2 24 25  GLUCOSE 73 96  BUN 18 17  CREATININE 1.00 1.05  CALCIUM 10.4* 10.0  PROT  --  7.0  ALBUMIN  --  3.9  AST  --  31  ALT  --  43  ALKPHOS  --  59  BILITOT  --  0.7  GFRNONAA >60 >60  GFRAA >60 >60  ANIONGAP 11 8     Hematology Recent Labs  Lab 02/26/18 1135  WBC 5.8  RBC 4.96  HGB 16.1  HCT 47.4  MCV 95.6  MCH 32.5  MCHC 34.0  RDW 12.8  PLT 221    Cardiac Enzymes Recent Labs  Lab 02/26/18 1415 02/27/18 0220  TROPONINI <0.03 <0.03    Recent Labs  Lab 02/26/18 1141  TROPIPOC 0.01     BNP Recent Labs  Lab 02/26/18 1415  BNP 13.7     DDimer  Recent Labs  Lab 02/26/18 1415  DDIMER <0.27     Radiology    Dg Chest 2 View  Result Date: 02/26/2018 CLINICAL DATA:  Shortness of breath and dizziness EXAM: CHEST - 2 VIEW COMPARISON:  None. FINDINGS: There is minimal scarring in  the left base. There is no edema or consolidation. The heart size and pulmonary vascularity are normal. No adenopathy. There is aortic atherosclerosis. There is mild degenerative change in the thoracic spine. IMPRESSION: Aortic atherosclerosis. No edema or consolidation. Minimal scarring left base. Aortic Atherosclerosis (ICD10-I70.0). Electronically Signed   By: Lowella Grip III M.D.   On: 02/26/2018 12:16    Cardiac Studies   Echo 05/22/2017: Study Conclusions  - Left ventricle: The cavity size was normal. Wall thickness was   normal. Systolic function was normal. The estimated ejection   fraction was in the range of 60% to 65%. Wall motion was normal;   there were no regional wall motion abnormalities. Doppler   parameters are consistent with abnormal left ventricular   relaxation (grade 1 diastolic dysfunction). - Mitral valve: Mildly calcified annulus. Valve area by pressure   half-time: 2.44 cm^2.  Patient Profile     77 y.o. male with known CAD and history of inferior MI in 2018 who was hospitalized  5/22 with progressive shortness of breath  Assessment & Plan    CAD, native vessel, with atypical angina: Shortness of breath could be his anginal equivalent.  To date there is no objective evidence of ischemia/infarction with a normal EKG, d-dimer, troponin, BNP, and chest x-ray.  His previous cardiac catheterization study is reviewed and he did have moderate left circumflex stenosis. We discussed further diagnostic options and he strongly prefers definitive evaluation. I have recommended cardiac catheterization with possible angioplasty and stenting. I have reviewed the risks, indications, and alternatives to cardiac catheterization, possible angioplasty, and stenting with the patient. Risks include but are not limited to bleeding, infection, vascular injury, stroke, myocardial infection, arrhythmia, kidney injury, radiation-related injury in the case of prolonged fluoroscopy use, emergency cardiac surgery, and death. The patient understands the risks of serious complication is 1-2 in 3662 with diagnostic cardiac cath and 1-2% or less with angioplasty/stenting. Orders placed. Cath lab called patient put on add-on schedule.   For questions or updates, please contact [place] Please consult [email or web address] for contact info under Cardiology/STEMI.      Signed, Sherren Mocha, MD  02/27/2018, 9:17 AM

## 2018-02-27 NOTE — Progress Notes (Signed)
  Echocardiogram 2D Echocardiogram has been performed.  Jannett Celestine 02/27/2018, 11:51 AM

## 2018-02-27 NOTE — Progress Notes (Signed)
Progress Note  Patient Name: Timothy Wilcox Date of Encounter: 02/27/2018  Primary Cardiologist: Sherren Mocha, MD   Subjective   Still short of breath with walking. Symptoms progressive x 2 weeks. No chest pain.  Inpatient Medications    Scheduled Meds: . allopurinol  300 mg Oral Daily  . aspirin EC  81 mg Oral Daily  . clopidogrel  75 mg Oral Daily  . enoxaparin (LOVENOX) injection  40 mg Subcutaneous Q24H  . fluticasone  1 spray Each Nare BID  . loratadine  10 mg Oral BID  . metoprolol succinate  12.5 mg Oral Daily  . multivitamin with minerals  1 tablet Oral Daily  . sodium chloride flush  3 mL Intravenous Q12H   Continuous Infusions: . sodium chloride     PRN Meds: sodium chloride, acetaminophen, ibuprofen, nitroGLYCERIN, ondansetron (ZOFRAN) IV, sodium chloride flush, tamsulosin   Vital Signs    Vitals:   02/26/18 1707 02/26/18 2015 02/26/18 2100 02/27/18 0634  BP: 123/78 (!) 199/66  110/64  Pulse: 74 74  63  Resp: 19     Temp: 98.2 F (36.8 C) 98 F (36.7 C)  97.7 F (36.5 C)  TempSrc: Oral Oral  Oral  SpO2: 98% 96%  100%  Weight: 214 lb 14.4 oz (97.5 kg)   213 lb 8 oz (96.8 kg)  Height:   5\' 7"  (1.702 m)     Intake/Output Summary (Last 24 hours) at 02/27/2018 0917 Last data filed at 02/26/2018 2100 Gross per 24 hour  Intake 240 ml  Output -  Net 240 ml   Filed Weights   02/26/18 1707 02/27/18 0634  Weight: 214 lb 14.4 oz (97.5 kg) 213 lb 8 oz (96.8 kg)    Telemetry    NSR, no arrhythmia - Personally Reviewed  ECG    NSR, within normal limits - Personally Reviewed  Physical Exam  Alert, oriented male, mildly dyspneic with talking GEN: No acute distress.   Neck: No JVD Cardiac: RRR, no murmurs, rubs, or gallops.  Respiratory: Clear to auscultation bilaterally. GI: Soft, nontender, non-distended MS: No edema; No deformity. Neuro:  Nonfocal  Psych: Normal affect   Labs    Chemistry Recent Labs  Lab 02/26/18 1135  02/26/18 1415  NA 140 138  K 4.7 4.4  CL 105 105  CO2 24 25  GLUCOSE 73 96  BUN 18 17  CREATININE 1.00 1.05  CALCIUM 10.4* 10.0  PROT  --  7.0  ALBUMIN  --  3.9  AST  --  31  ALT  --  43  ALKPHOS  --  59  BILITOT  --  0.7  GFRNONAA >60 >60  GFRAA >60 >60  ANIONGAP 11 8     Hematology Recent Labs  Lab 02/26/18 1135  WBC 5.8  RBC 4.96  HGB 16.1  HCT 47.4  MCV 95.6  MCH 32.5  MCHC 34.0  RDW 12.8  PLT 221    Cardiac Enzymes Recent Labs  Lab 02/26/18 1415 02/27/18 0220  TROPONINI <0.03 <0.03    Recent Labs  Lab 02/26/18 1141  TROPIPOC 0.01     BNP Recent Labs  Lab 02/26/18 1415  BNP 13.7     DDimer  Recent Labs  Lab 02/26/18 1415  DDIMER <0.27     Radiology    Dg Chest 2 View  Result Date: 02/26/2018 CLINICAL DATA:  Shortness of breath and dizziness EXAM: CHEST - 2 VIEW COMPARISON:  None. FINDINGS: There is minimal scarring in  the left base. There is no edema or consolidation. The heart size and pulmonary vascularity are normal. No adenopathy. There is aortic atherosclerosis. There is mild degenerative change in the thoracic spine. IMPRESSION: Aortic atherosclerosis. No edema or consolidation. Minimal scarring left base. Aortic Atherosclerosis (ICD10-I70.0). Electronically Signed   By: Lowella Grip III M.D.   On: 02/26/2018 12:16    Cardiac Studies   Echo 05/22/2017: Study Conclusions  - Left ventricle: The cavity size was normal. Wall thickness was   normal. Systolic function was normal. The estimated ejection   fraction was in the range of 60% to 65%. Wall motion was normal;   there were no regional wall motion abnormalities. Doppler   parameters are consistent with abnormal left ventricular   relaxation (grade 1 diastolic dysfunction). - Mitral valve: Mildly calcified annulus. Valve area by pressure   half-time: 2.44 cm^2.  Patient Profile     77 y.o. male with known CAD and history of inferior MI in 2018 who was hospitalized  5/22 with progressive shortness of breath  Assessment & Plan    CAD, native vessel, with atypical angina: Shortness of breath could be his anginal equivalent.  To date there is no objective evidence of ischemia/infarction with a normal EKG, d-dimer, troponin, BNP, and chest x-ray.  His previous cardiac catheterization study is reviewed and he did have moderate left circumflex stenosis. We discussed further diagnostic options and he strongly prefers definitive evaluation. I have recommended cardiac catheterization with possible angioplasty and stenting. I have reviewed the risks, indications, and alternatives to cardiac catheterization, possible angioplasty, and stenting with the patient. Risks include but are not limited to bleeding, infection, vascular injury, stroke, myocardial infection, arrhythmia, kidney injury, radiation-related injury in the case of prolonged fluoroscopy use, emergency cardiac surgery, and death. The patient understands the risks of serious complication is 1-2 in 4503 with diagnostic cardiac cath and 1-2% or less with angioplasty/stenting. Orders placed. Cath lab called patient put on add-on schedule.   For questions or updates, please contact [place] Please consult [email or web address] for contact info under Cardiology/STEMI.      Signed, Sherren Mocha, MD  02/27/2018, 9:17 AM

## 2018-02-27 NOTE — Interval H&P Note (Signed)
Cath Lab Visit (complete for each Cath Lab visit)  Clinical Evaluation Leading to the Procedure:   ACS: No.  Non-ACS:    Anginal Classification: CCS III  Anti-ischemic medical therapy: Minimal Therapy (1 class of medications)  Non-Invasive Test Results: No non-invasive testing performed  Prior CABG: No previous CABG      History and Physical Interval Note:  02/27/2018 1:20 PM  Timothy Wilcox  has presented today for surgery, with the diagnosis of cp  The various methods of treatment have been discussed with the patient and family. After consideration of risks, benefits and other options for treatment, the patient has consented to  Procedure(s): LEFT HEART CATH AND CORONARY ANGIOGRAPHY (N/A) as a surgical intervention .  The patient's history has been reviewed, patient examined, no change in status, stable for surgery.  I have reviewed the patient's chart and labs.  Questions were answered to the patient's satisfaction.     Shelva Majestic

## 2018-02-28 ENCOUNTER — Encounter (HOSPITAL_COMMUNITY): Payer: Self-pay | Admitting: Cardiovascular Disease

## 2018-02-28 DIAGNOSIS — Z955 Presence of coronary angioplasty implant and graft: Secondary | ICD-10-CM

## 2018-02-28 DIAGNOSIS — I209 Angina pectoris, unspecified: Secondary | ICD-10-CM

## 2018-02-28 LAB — BASIC METABOLIC PANEL
ANION GAP: 10 (ref 5–15)
BUN: 17 mg/dL (ref 6–20)
CALCIUM: 9.1 mg/dL (ref 8.9–10.3)
CHLORIDE: 109 mmol/L (ref 101–111)
CO2: 21 mmol/L — AB (ref 22–32)
CREATININE: 0.97 mg/dL (ref 0.61–1.24)
GFR calc non Af Amer: >60 mL/min (ref >60)
Glucose, Bld: 92 mg/dL (ref 65–99)
Potassium: 4.5 mmol/L (ref 3.5–5.1)
SODIUM: 140 mmol/L (ref 135–145)

## 2018-02-28 LAB — CBC
HCT: 41.2 % (ref 39.0–52.0)
HEMOGLOBIN: 13.7 g/dL (ref 13.0–17.0)
MCH: 32.3 pg (ref 26.0–34.0)
MCHC: 33.3 g/dL (ref 30.0–36.0)
MCV: 97.2 fL (ref 78.0–100.0)
PLATELETS: 203 10*3/uL (ref 150–400)
RBC: 4.24 MIL/uL (ref 4.22–5.81)
RDW: 12.9 % (ref 11.5–15.5)
WBC: 6.2 10*3/uL (ref 4.0–10.5)

## 2018-02-28 LAB — TROPONIN I: Troponin I: <0.03 ng/mL (ref <0.03)

## 2018-02-28 MED FILL — Heparin Sod (Porcine)-NaCl IV Soln 1000 Unit/500ML-0.9%: INTRAVENOUS | Qty: 1000 | Status: AC

## 2018-02-28 NOTE — Progress Notes (Signed)
[facility] BAND REMOVAL  LOCATION:    right radial  DEFLATED PER PROTOCOL:    Yes.    TIME BAND OFF / DRESSING APPLIED:    2000   SITE UPON ARRIVAL:    Level 0  SITE AFTER BAND REMOVAL:    Level 0  CIRCULATION SENSATION AND MOVEMENT:    Within Normal Limits   Yes.    COMMENTS:   Pt.tolerated procedure well 

## 2018-02-28 NOTE — Progress Notes (Signed)
CARDIAC REHAB PHASE I   PRE:  Rate/Rhythm: 73 SR  BP:  Supine:   Sitting: 127/62  Standing:    SaO2:   MODE:  Ambulation: 700 ft   POST:  Rate/Rhythm: 85 SR  BP:  Supine:   Sitting: 159/73  Standing:    SaO2: 99%RA 0855-0935 Pt walked 700 ft on RA with steady gait and tolerated well. No CP or c/o SOB. Education completed with pt who voiced understanding. Stressed importance of plavix with stent. Reviewed NTG use, gave heart healthy diet, ex ed and CRP 2. Pt just completed CRP 2 in fall.  He does not want to repeat program. Will ex on his own at home as he has bought ex equipment.   Graylon Good, RN BSN  02/28/2018 9:33 AM

## 2018-02-28 NOTE — Discharge Summary (Signed)
Discharge Summary    Patient ID: Timothy Wilcox,  MRN: [id number], DOB/AGE: August 14, 1941 77 y.o.  Admit date: 02/26/2018 Discharge date: 02/28/2018  Primary Care Provider: Hulan Wilcox Primary Cardiologist: Timothy Mocha, MD  Discharge Diagnoses    Active Problems:   Shortness of breath   Angina pectoris [facility])   Status post primary angioplasty with coronary stent   Allergies Allergies  Allergen Reactions  . Brilinta [Ticagrelor] Shortness Of Breath and Other (See Comments)    Dizziness, weakness/Caused hospital admission  . Statins Other (See Comments)    Pain in joints, cant move knees  . Dicyclomine Other (See Comments)    Interfered with sleep,nervous  . Tagamet [Cimetidine] Other (See Comments)    Gynecomastia     Diagnostic Studies/Procedures    CORONARY STENT INTERVENTION  LEFT HEART CATH AND CORONARY ANGIOGRAPHY 02/27/18  Conclusion    Previously placed Dist RCA stent (unknown type) is widely patent.  Prox Cx lesion is 80% stenosed.  Post intervention, there is a 0% residual stenosis.  The left ventricular systolic function is normal.  A stent was successfully placed.   Preserved to low normal LV function with minimal residual mid to basal inferior hypocontractility and an ejection fraction of 50%.  LVEDP 10 mmHg.  Coronary obstructive disease with a normal LAD, normal ramus intermediate vessel, progressive 80% proximal left circumflex stenosis, and widely patent previously placed distal RCA stent at the site of prior inferior STEMI.  Successful PCI to the 80% left circumflex stenosis with the patient participating in the [place] stent protocol with ultimate insertion of a 3.0 x 13 mm sirolimus DES stent postdilated to 3.31 mm with the 80% stenosis being reduced to 0%.  RECOMMENDATION: Continue DAPT for at least a year.  Medical therapy.  The patient has intolerance to statins.  Consider Zetia.    Echocardiogram 02/27/18 Study  Conclusions  - Left ventricle: The cavity size was normal. Systolic function was   normal. The estimated ejection fraction was in the range of 55%   to 60%. Wall motion was normal; there were no regional wall   motion abnormalities. There was an increased relative   contribution of atrial contraction to ventricular filling.   Doppler parameters are consistent with abnormal left ventricular   relaxation (grade 1 diastolic dysfunction). Doppler parameters   are consistent with high ventricular filling pressure. - Aortic valve: Trileaflet; mildly thickened, moderately calcified   leaflets. - Mitral valve: Calcified annulus. - Right ventricle: The cavity size was mildly dilated. Wall   thickness was normal. Systolic function was mildly reduced.  _____________   History of Present Illness     Timothy Wilcox is a 77 y.o. male with coronary artery disease status post inferior ST elevation myocardial infarction in June 2018 treated with a DES to the distal RCA, hyperlipidemia, sleep apnea.  He had significant side effects to Ticagrelor prompting transition to Clopidogrel.  He was last seen by Dr. Burt Wilcox 05/09/2017.  Has been evaluated by the lipid clinic due to intolerance to statin therapy.  Timothy Wilcox returned to the office on 02/26/18 for follow up.  Over the prior 2 weeks, he had developed significant dyspnea on exertion with minimal activity.  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  On the day of his appointment he could not walk across a room without getting short of breath.  He denied chest pain, arm pain, jaw pain.  He denied syncope but had  been lightheaded.  He denied orthopnea, paroxysmal nocturnal dyspnea, edema, weight gain.  He was visibly short of breath just talking.   The patient was referred for admission to [facility] for workup.    Hospital Course     Consultants: none  There was no evidence of  ischemia/infarction with a normal EKG,  d-dimer, troponin, BNP, and chest x-ray.  His previous cardiac catheterization study was reviewed and he did have moderate left circumflex stenosis. He was taken to the cath lab on 02/27/18 and found to have patent RCA stent and 80% stenosed proximal circumflex. He underwent successful PCI to the circ with the patient participating in the [employer] Study stent protocol with ultimate insertion of a 3.0 x 13 mm sirolimus DES.   Today Labs are in range, EKG without acute changes. Stable for discharge this am. Continue DAPT with ASA and clopidogrel at least another 12 months without interruption.  Otherwise continue home medical program.  Hyperlipidemia: LDL 61. Statin-intolerant.  We discussed alternatives to statin drugs and he prefers to stay off of any lipid lowering medicines such as ezetimibe or fibrates at this point.  He will work on modifying his diet and work on lifestyle modification.  Patient has been seen by Dr. Burt Wilcox today and deemed ready for discharge home. All follow up appointments have been scheduled. Discharge medications are listed below.  _____________  Discharge Vitals Blood pressure 112/70, pulse 61, temperature (!) 97.3 F (36.3 C), temperature source Oral, resp. rate 13, height 5\' 7"  (1.702 m), weight 222 lb 7.1 oz (100.9 kg), SpO2 100 %.  Filed Weights   02/26/18 1707 02/27/18 0634 02/28/18 0334  Weight: 214 lb 14.4 oz (97.5 kg) 213 lb 8 oz (96.8 kg) 222 lb 7.1 oz (100.9 kg)    Labs & Radiologic Studies    CBC Recent Labs    02/26/18 1135 02/27/18 1542 02/28/18 0222  WBC 5.8 5.7 6.2  NEUTROABS 3.0  --   --   HGB 16.1 13.6 13.7  HCT 47.4 40.7 41.2  MCV 95.6 96.0 97.2  PLT 221 209 962   Basic Metabolic Panel Recent Labs    02/26/18 1415 02/27/18 1542 02/28/18 0222  NA 138  --  140  K 4.4  --  4.5  CL 105  --  109  CO2 25  --  21*  GLUCOSE 96  --  92  BUN 17  --  17  CREATININE 1.05 0.69 0.97  CALCIUM 10.0  --  9.1   Liver Function Tests Recent  Labs    02/26/18 1415  AST 31  ALT 43  ALKPHOS 59  BILITOT 0.7  PROT 7.0  ALBUMIN 3.9   No results for input(s): LIPASE, AMYLASE in the last 72 hours. Cardiac Enzymes Recent Labs    02/27/18 2056 02/28/18 0222 02/28/18 0847  TROPONINI <0.03 <0.03 <0.03   BNP Invalid input(s): POCBNP D-Dimer Recent Labs    02/26/18 1415  DDIMER <0.27   Hemoglobin A1C No results for input(s): HGBA1C in the last 72 hours. Fasting Lipid Panel Recent Labs    02/27/18 0220  CHOL 148  HDL 45  LDLCALC 61  TRIG 208*  CHOLHDL 3.3   Thyroid Function Tests Recent Labs    02/26/18 1600  TSH 1.471   _____________  Dg Chest 2 View  Result Date: 02/26/2018 CLINICAL DATA:  Shortness of breath and dizziness EXAM: CHEST - 2 VIEW COMPARISON:  None. FINDINGS: There is minimal scarring in the left base. There  is no edema or consolidation. The heart size and pulmonary vascularity are normal. No adenopathy. There is aortic atherosclerosis. There is mild degenerative change in the thoracic spine. IMPRESSION: Aortic atherosclerosis. No edema or consolidation. Minimal scarring left base. Aortic Atherosclerosis (ICD10-I70.0). Electronically Signed   By: Lowella Grip III M.D.   On: 02/26/2018 12:16   Disposition   Pt is being discharged home today in good condition.  Follow-up Plans & Appointments    Follow-up Information    [facility] [facility], [place], [place] Follow up.   Specialty:  Cardiology Why:  Cardiology hospital follow up on June 11th at 11:00. Please arrive 15 minutes early for check in.  Contact information: [place] [place] [phone]          Discharge Instructions    Amb Referral to Cardiac Rehabilitation   Complete by:  As directed    Diagnosis:  Coronary Stents   Diet - low sodium heart healthy   Complete by:  As directed    Discharge instructions   Complete by:  As directed    PLEASE REMEMBER TO BRING ALL OF YOUR MEDICATIONS TO EACH OF YOUR  FOLLOW-UP OFFICE VISITS.  PLEASE ATTEND ALL SCHEDULED FOLLOW-UP APPOINTMENTS.   Activity: Increase activity slowly as tolerated. You may shower, but no soaking baths (or swimming) for 1 week. No driving for 24 hours. No lifting over 5 lbs for 1 week. No sexual activity for 1 week.   You May Return to Work: in 1 week (if applicable)  Wound Care: You may wash cath site gently with soap and water. Keep cath site clean and dry. If you notice pain, swelling, bleeding or pus at your cath site, please call [phone].   Increase activity slowly   Complete by:  As directed       Discharge Medications   Allergies as of 02/28/2018      Reactions   Brilinta [ticagrelor] Shortness Of Breath, Other (See Comments)   Dizziness, weakness/Caused hospital admission   Statins Other (See Comments)   Pain in joints, cant move knees   Dicyclomine Other (See Comments)   Interfered with sleep,nervous   Tagamet [cimetidine] Other (See Comments)   Gynecomastia       Medication List    TAKE these medications   allopurinol 300 MG tablet Commonly known as:  ZYLOPRIM Take 300 mg by mouth daily.   aspirin EC 81 MG tablet Take 81 mg by mouth daily.   clopidogrel 75 MG tablet Commonly known as:  PLAVIX Take 1 tablet (75 mg total) by mouth daily.   DAILY PROBIOTIC PO Take 1 tablet by mouth daily.   fluticasone 50 MCG/ACT nasal spray Commonly known as:  FLONASE Place 1 spray into both nostrils 2 (two) times daily.   ibuprofen 200 MG tablet Commonly known as:  ADVIL,MOTRIN Take 400 mg by mouth every 6 (six) hours as needed for headache or moderate pain.   loratadine 10 MG tablet Commonly known as:  CLARITIN Take 10 mg by mouth 2 (two) times daily.   metoprolol succinate 25 MG 24 hr tablet Commonly known as:  TOPROL XL Take 0.5 tablets (12.5 mg total) by mouth daily.   multivitamin with minerals Tabs tablet Take 1 tablet by mouth daily.   nitroGLYCERIN 0.4 MG SL tablet Commonly known as:   NITROSTAT Place 1 tablet (0.4 mg total) under the tongue every 5 (five) minutes x 3 doses as needed for chest pain.   sildenafil 100 MG  tablet Commonly known as:  VIAGRA Take 100 mg by mouth daily as needed for erectile dysfunction.   tamsulosin 0.4 MG Caps capsule Commonly known as:  FLOMAX Take 0.4 mg by mouth as needed (used if her has a kidney stone).        Aspirin prescribed at discharge?  Yes High Intensity Statin Prescribed? (Lipitor 40-80mg  or Crestor 20-40mg ): No: intolerant Beta Blocker Prescribed? Yes For EF <40%, was ACEI/ARB Prescribed? No: normal EF ADP Receptor Inhibitor Prescribed? (i.e. Plavix etc.-Includes Medically Managed Patients): Yes For EF <40%, Aldosterone Inhibitor Prescribed? No: normal EF Was EF assessed during THIS hospitalization? Yes Was Cardiac Rehab II ordered? (Included Medically managed Patients): Yes   Outstanding Labs/Studies   none  Duration of Discharge Encounter   Greater than 30 minutes including physician time.  Signed, Luna Fuse, NP 02/28/2018, 12:05 PM

## 2018-02-28 NOTE — Progress Notes (Signed)
Progress Note  Patient Name: Timothy Wilcox Date of Encounter: 02/28/2018  Primary Cardiologist: Sherren Mocha, MD   Subjective   Feels well. No chest pain. Breathing improved.   Inpatient Medications    Scheduled Meds: . allopurinol  300 mg Oral Daily  . aspirin EC  81 mg Oral Daily  . clopidogrel  75 mg Oral Daily  . enoxaparin (LOVENOX) injection  40 mg Subcutaneous Q24H  . fluticasone  1 spray Each Nare BID  . loratadine  10 mg Oral BID  . metoprolol succinate  12.5 mg Oral Daily  . multivitamin with minerals  1 tablet Oral Daily  . sodium chloride flush  3 mL Intravenous Q12H   Continuous Infusions: . sodium chloride 100 mL/hr at 02/27/18 2319  . sodium chloride     PRN Meds: sodium chloride, acetaminophen, diazepam, ibuprofen, nitroGLYCERIN, ondansetron (ZOFRAN) IV, tamsulosin   Vital Signs    Vitals:   02/27/18 1956 02/27/18 2000 02/28/18 0334 02/28/18 0617  BP: 121/60  (!) 123/59 100/66  Pulse: 75 81 64   Resp: 12 20 11 11   Temp: 98 F (36.7 C)  97.7 F (36.5 C)   TempSrc: Oral  Oral   SpO2: 98% 98% 98%   Weight:   222 lb 7.1 oz (100.9 kg)   Height:        Intake/Output Summary (Last 24 hours) at 02/28/2018 5284 Last data filed at 02/28/2018 0600 Gross per 24 hour  Intake 2275.83 ml  Output 1225 ml  Net 1050.83 ml   Filed Weights   02/26/18 1707 02/27/18 0634 02/28/18 0334  Weight: 214 lb 14.4 oz (97.5 kg) 213 lb 8 oz (96.8 kg) 222 lb 7.1 oz (100.9 kg)    Telemetry    Normal sinus, no significant arrhythmia - Personally Reviewed  ECG    NSR, nonspecific ST abnormality - Personally Reviewed  Physical Exam  Alert, oriented male in NAD GEN: No acute distress.   Neck: No JVD Cardiac: RRR, no murmurs, rubs, or gallops.  Respiratory: Clear to auscultation bilaterally. GI: Soft, nontender, non-distended  MS: No edema; No deformity. Right radial site clear Neuro:  Nonfocal  Psych: Normal affect   Labs    Chemistry Recent Labs    Lab 02/26/18 1135 02/26/18 1415 02/27/18 1542 02/28/18 0222  NA 140 138  --  140  K 4.7 4.4  --  4.5  CL 105 105  --  109  CO2 24 25  --  21*  GLUCOSE 73 96  --  92  BUN 18 17  --  17  CREATININE 1.00 1.05 0.69 0.97  CALCIUM 10.4* 10.0  --  9.1  PROT  --  7.0  --   --   ALBUMIN  --  3.9  --   --   AST  --  31  --   --   ALT  --  43  --   --   ALKPHOS  --  59  --   --   BILITOT  --  0.7  --   --   GFRNONAA >60 >60 >60 >60  GFRAA >60 >60 >60 >60  ANIONGAP 11 8  --  10     Hematology Recent Labs  Lab 02/26/18 1135 02/27/18 1542 02/28/18 0222  WBC 5.8 5.7 6.2  RBC 4.96 4.24 4.24  HGB 16.1 13.6 13.7  HCT 47.4 40.7 41.2  MCV 95.6 96.0 97.2  MCH 32.5 32.1 32.3  MCHC 34.0 33.4 33.3  RDW 12.8 12.7 12.9  PLT 221 209 203    Cardiac Enzymes Recent Labs  Lab 02/26/18 1415 02/27/18 0220 02/27/18 2056 02/28/18 0222  TROPONINI <0.03 <0.03 <0.03 <0.03    Recent Labs  Lab 02/26/18 1141  TROPIPOC 0.01     BNP Recent Labs  Lab 02/26/18 1415  BNP 13.7     DDimer  Recent Labs  Lab 02/26/18 1415  DDIMER <0.27     Radiology    Dg Chest 2 View  Result Date: 02/26/2018 CLINICAL DATA:  Shortness of breath and dizziness EXAM: CHEST - 2 VIEW COMPARISON:  None. FINDINGS: There is minimal scarring in the left base. There is no edema or consolidation. The heart size and pulmonary vascularity are normal. No adenopathy. There is aortic atherosclerosis. There is mild degenerative change in the thoracic spine. IMPRESSION: Aortic atherosclerosis. No edema or consolidation. Minimal scarring left base. Aortic Atherosclerosis (ICD10-I70.0). Electronically Signed   By: Lowella Grip III M.D.   On: 02/26/2018 12:16    Cardiac Studies   Cardiac Cath: Conclusion     Previously placed Dist RCA stent (unknown type) is widely patent.  Prox Cx lesion is 80% stenosed.  Post intervention, there is a 0% residual stenosis.  The left ventricular systolic function is  normal.  A stent was successfully placed.   Preserved to low normal LV function with minimal residual mid to basal inferior hypocontractility and an ejection fraction of 50%.  LVEDP 10 mmHg.  Coronary obstructive disease with a normal LAD, normal ramus intermediate vessel, progressive 80% proximal left circumflex stenosis, and widely patent previously placed distal RCA stent at the site of prior inferior STEMI.  Successful PCI to the 80% left circumflex stenosis with the patient participating in the [place] stent protocol with ultimate insertion of a 3.0 x 13 mm sirolimus DES stent postdilated to 3.31 mm with the 80% stenosis being reduced to 0%.  RECOMMENDATION: Continue DAPT for at least a year.  Medical therapy.  The patient has intolerance to statins.  Consider Zetia.    02/27/2018: Study Conclusions  - Left ventricle: The cavity size was normal. Systolic function was   normal. The estimated ejection fraction was in the range of 55%   to 60%. Wall motion was normal; there were no regional wall   motion abnormalities. There was an increased relative   contribution of atrial contraction to ventricular filling.   Doppler parameters are consistent with abnormal left ventricular   relaxation (grade 1 diastolic dysfunction). Doppler parameters   are consistent with high ventricular filling pressure. - Aortic valve: Trileaflet; mildly thickened, moderately calcified   leaflets. - Mitral valve: Calcified annulus. - Right ventricle: The cavity size was mildly dilated. Wall   thickness was normal. Systolic function was mildly reduced.  Patient Profile     77 y.o. male with known CAD and history of inferior MI in 2018 who was hospitalized 5/22 with progressive shortness of breath  Assessment & Plan    CAD, native vessel, with angina: uncomplicated LCx PCI yesterday. Labs in range, EKG without acute changes. Stable for discharge this am. Continue DAPT with ASA and clopidogrel  at least another 12 months without interruption.  Otherwise continue home medical program.  Hyperlipidemia: LDL 61. Statin-intolerant.  We discussed alternatives to statin drugs and he prefers to stay off of any lipid lowering medicines such as ezetimibe or fibrates at this point.  He will work on modifying his diet and work on lifestyle modification.  Disposition:  Stable for discharge this morning.  APP follow-up 1 to 2 weeks.  For questions or updates, please contact [place] Please consult [email or web address] for contact info under Cardiology/STEMI.      Signed, Sherren Mocha, MD  02/28/2018, 7:12 AM

## 2018-03-04 NOTE — Consult Note (Signed)
            [facility] CM Primary Care Navigator  03/04/2018  Timothy Wilcox Mar 31, 1941 [id number]   Attempt to seepatient at the bedside to identify possible discharge needs buthe was already discharged home.  Per chart review, patient was admitted for shortness of breath and angina pectoris, status post primary angioplasty with coronary stent.  Primary care provider's office is listed as providing transition of care (TOC) follow-up.  Patient has discharge instruction to follow-up with cardiology on 03/18/18.   For additional questions please contact:  Edwena Felty A. Blonnie Maske, BSN, RN-BC [facility] PRIMARY CARE Navigator Cell: [phone]

## 2018-03-06 ENCOUNTER — Telehealth: Payer: Self-pay | Admitting: Cardiovascular Disease

## 2018-03-06 NOTE — Telephone Encounter (Signed)
Informed pt to keep f/u appt with Vin, PA on 03/18/18. I explained to patient that symptoms are to be expected and it takes time for the body to recovery. I explained if he develops chest pain or symptoms get significantly worse to call office. He stated understanding and thankful for the call

## 2018-03-06 NOTE — Telephone Encounter (Signed)
New Message:      Pt c/o Shortness Of Breath: STAT if SOB developed within the last 24 hours or pt is noticeably SOB on the phone  1. Are you currently SOB (can you hear that pt is SOB on the phone)? yes  2. How long have you been experiencing SOB? Since yesterday  3. Are you SOB when sitting or when up moving around? both  4. Are you currently experiencing any other symptoms? Also feeling fatigue/ Pt also states he had a stent put in on last Friday and feeling some what of the same way he was feeling before he had the stent put in

## 2018-03-06 NOTE — Telephone Encounter (Signed)
Pt c/o increased DOE and fatigue. Pt post PCI on 02/27/18. He states his symptoms improved significantly after stent placed. Starting Wednesday he felt more winded with walking just short distances. Pt states he can walk 154ft and will have to sit down. He states Wednesday he felt dizzy, lightheaded and fatigue with walking up stairs, he states he took a 1 nitro. BP was 150/80 He denies chest pain, palpitation or syncope. I explained that nitro should only be taken to relieve chest pain and not recommended to take for fatigue or sob. I spoke with Dr. Burt Knack, per Dr. Burt Knack continue to monitor and keep f/u appt with Vin, PA

## 2018-03-11 DIAGNOSIS — C44310 Basal cell carcinoma of skin of unspecified parts of face: Secondary | ICD-10-CM | POA: Diagnosis not present

## 2018-03-11 DIAGNOSIS — L291 Pruritus scroti: Secondary | ICD-10-CM | POA: Diagnosis not present

## 2018-03-17 NOTE — Progress Notes (Signed)
Cardiology Office Note    Date:  03/18/2018   ID:  Timothy, Wilcox 10-Feb-1941, MRN [id number]  PCP:  Hulan Fess, MD  Cardiologist:  Sherren Mocha, MD  Chief Complaint: Hospital follow up  History of Present Illness:   Timothy Wilcox is a 77 y.o. malewith coronary artery disease status post inferior ST elevation myocardial infarction in June 2018 treated with a DES to the distal RCA and recent DES to proximal circumflex, hyperlipidemia, sleep apnea who presents for hospital follow up.   He had significant side effects to Ticagrelor prompting transition to Clopidogrel. Has been evaluated by the lipid clinic due to intolerance to statin therapy.  Admitted to hospital from clinic 02/2018 for DOE. He was taken to the cath lab on 02/27/18 and found to have patent RCA stent and 80% stenosed proximal circumflex. He underwent successful PCI to the circ with the patient participating in the Optimize Study stent protocol with ultimate insertion of a 3.0 x 13 mm sirolimus DES. Declined lipid lowering medicines such as ezetimibe or fibrates at this point. He will work on modifying his diet and work on lifestyle modification.  Here today for follow-up.  Patient felt well for 3 to 4 days post PCI.  Since then he has progressive worsening dyspnea on exertion, similar to hospital presentation.  He denies any substernal chest pain/pressure.  No orthopnea, PND, syncope, dizziness or lower extremity edema.  Currently feels like hypoglycemic.  Blood sugar 138.  Given crackers and coke.   Past Medical History:  Diagnosis Date  . Arthritis   . CAD (coronary artery disease) 02/26/2018   A. Inf STEMI 6/18: LHC - pLCx 60, dRCA 100, EF 45-50 >> PCI:  DES to RCA // B. Echo 8/18: EF 60-65, nwm, grade 1 dd, MAC. C. LHD DES to circ, patnet RCA stent  . Gout   . 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 Hyperlipemia    intol to some statins  . IBS (irritable bowel syndrome)   . Medication  intolerance    severe SOB due to Brilinta >> changed to Plavix  . Renal disorder    kidney stones  . Seasonal allergies   . Sinus congestion    chronic  . Sleep apnea    uses a c-pap    Past Surgical History:  Procedure Laterality Date  . COLONOSCOPY    . CORONARY STENT INTERVENTION N/A 04/03/2017   Procedure: Coronary Stent Intervention;  Surgeon: Sherren Mocha, MD;  Location: [facility];  Service: Cardiovascular;  Laterality: N/A;  . CORONARY STENT INTERVENTION N/A 02/27/2018   Procedure: CORONARY STENT INTERVENTION;  Surgeon: Troy Sine, MD;  Location: [facility];  Service: Cardiovascular;  Laterality: N/A;  . INCISION AND DRAINAGE  2011   infected finger  . LEFT HEART CATH AND CORONARY ANGIOGRAPHY N/A 04/03/2017   Procedure: Left Heart Cath and Coronary Angiography;  Surgeon: Sherren Mocha, MD;  Location: [facility];  Service: Cardiovascular;  Laterality: N/A;  . LEFT HEART CATH AND CORONARY ANGIOGRAPHY N/A 02/27/2018   Procedure: LEFT HEART CATH AND CORONARY ANGIOGRAPHY;  Surgeon: Troy Sine, MD;  Location: [facility];  Service: Cardiovascular;  Laterality: N/A;  . SHOULDER ARTHROSCOPY WITH ROTATOR CUFF REPAIR AND SUBACROMIAL DECOMPRESSION Right 01/29/2013   Procedure: RIGHT SHOULDER ARTHROSCOPY WITH SUBACROMIAL DECOMPRESSION, THREE TENDON ROTATOR CUFF REPAIR;  Surgeon: Cammie Sickle., MD;  Location: [facility];  Service: Orthopedics;  Laterality: Right;  . TOE DEBRIDEMENT     rt toe cyst    Current Medications: Prior to Admission medications   Medication Sig Start Date End Date Taking? Authorizing Provider  allopurinol (ZYLOPRIM) 300 MG tablet Take 300 mg by mouth daily.    [provider]  aspirin EC 81 MG tablet Take 81 mg by mouth daily.    [provider]  clopidogrel (PLAVIX) 75 MG tablet Take 1 tablet (75 mg total) by mouth daily. 04/30/17   Sherren Mocha, MD  fluticasone [facility]) 50  MCG/ACT nasal spray Place 1 spray into both nostrils 2 (two) times daily.    [provider]  ibuprofen (ADVIL,MOTRIN) 200 MG tablet Take 400 mg by mouth every 6 (six) hours as needed for headache or moderate pain.     [provider]  loratadine (CLARITIN) 10 MG tablet Take 10 mg by mouth 2 (two) times daily.     [provider]  metoprolol succinate (TOPROL XL) 25 MG 24 hr tablet Take 0.5 tablets (12.5 mg total) by mouth daily. 05/09/17 05/09/18  Sherren Mocha, MD  Multiple Vitamin (MULTIVITAMIN WITH MINERALS) TABS Take 1 tablet by mouth daily.    [provider]  nitroGLYCERIN (NITROSTAT) 0.4 MG SL tablet Place 1 tablet (0.4 mg total) under the tongue every 5 (five) minutes x 3 doses as needed for chest pain. 04/05/17   Leanor Kail, PA  Probiotic Product (DAILY PROBIOTIC PO) Take 1 tablet by mouth daily.    [provider]  sildenafil (VIAGRA) 100 MG tablet Take 100 mg by mouth daily as needed for erectile dysfunction.     [provider]  tamsulosin (FLOMAX) 0.4 MG CAPS Take 0.4 mg by mouth as needed (used if her has a kidney stone). 03/24/12   Hyman Bible, PA-C    Allergies:   Brilinta [ticagrelor]; Statins; Dicyclomine; and Tagamet [cimetidine]   Social History   Socioeconomic History  . Marital status: Married    Spouse name: Not on file  . Number of children: Not on file  . Years of education: Not on file  . Highest education level: Not on file  Occupational History  . Not on file  Social Needs  . Financial resource strain: Not on file  . Food insecurity:    Worry: Not on file    Inability: Not on file  . Transportation needs:    Medical: Not on file    Non-medical: Not on file  Tobacco Use  . Smoking status: Never Smoker  . Smokeless tobacco: Never Used  Substance and Sexual Activity  . Alcohol use: Yes    Comment: a bottle of win every night  . Drug use: No  . Sexual activity: Not on file  Lifestyle  .  Physical activity:    Days per week: Not on file    Minutes per session: Not on file  . Stress: Not on file  Relationships  . Social connections:    Talks on phone: Not on file    Gets together: Not on file    Attends religious service: Not on file    Active member of club or organization: Not on file    Attends meetings of clubs or organizations: Not on file    Relationship status: Not on file  Other Topics Concern  . Not on file  Social History Narrative  . Not on file     Family History:  The patient's family history is not on file.  ROS:   Please see the history of present illness.    ROS All other systems reviewed and are negative.   PHYSICAL EXAM:   VS:  BP 108/74   Pulse 74   Ht 5' 7"  (1.702 m)   Wt 217 lb (98.4 kg)   BMI 33.99 kg/m    GEN: Well nourished, well developed, in no acute distress  HEENT: normal  Neck: no JVD, carotid bruits, or masses Cardiac:RRR; no murmurs, rubs, or gallops,no edema  Respiratory:  clear to auscultation bilaterally, normal work of breathing GI: soft, nontender, nondistended, + BS MS: no deformity or atrophy  Skin: warm and dry, no rash Neuro:  Alert and Oriented x 3, Strength and sensation are intact Psych: euthymic mood, full affect  Wt Readings from Last 3 Encounters:  03/18/18 217 lb (98.4 kg)  02/28/18 222 lb 7.1 oz (100.9 kg)  02/26/18 216 lb (98 kg)      Studies/Labs Reviewed:   EKG:  EKG is ordered today.  The ekg ordered today demonstrates normal sinus rhythm.  No EKG change compared to prior  Recent Labs: 02/26/2018: ALT 43; B Natriuretic Peptide 13.7; TSH 1.471 02/28/2018: BUN 17; Creatinine, Ser 0.97; Hemoglobin 13.7; Platelets 203; Potassium 4.5; Sodium 140   Lipid Panel    Component Value Date/Time   CHOL 148 02/27/2018 0220   CHOL 165 07/15/2017 1003   TRIG 208 (H) 02/27/2018 0220   HDL 45 02/27/2018 0220   HDL 63 07/15/2017 1003   CHOLHDL 3.3 02/27/2018 0220   VLDL 42 (H) 02/27/2018 0220   LDLCALC  61 02/27/2018 0220   LDLCALC 55 07/15/2017 1003    Additional studies/ records that were reviewed today include:   CORONARY STENT INTERVENTION  LEFT HEART CATH AND CORONARY ANGIOGRAPHY 02/27/18  Conclusion    Previously placed Dist RCA stent (unknown type) is widely patent.  Prox Cx lesion is 80% stenosed.  Post intervention, there is a 0% residual stenosis.  The left ventricular systolic function is normal.  A stent was successfully placed.  Preserved to low normal LV function with minimal residual mid to basal inferior hypocontractility and an ejection fraction of 50%. LVEDP 10 mmHg.  Coronary obstructive disease with a normal LAD, normal ramus intermediate vessel, progressive 80% proximal left circumflex stenosis, and widely patent previously placed distal RCA stent at the site of prior inferior STEMI.  Successful PCI to the 80% left circumflex stenosis with the patient participating in the [place] stent protocol with ultimate insertion of a 3.0 x 13 mm sirolimus DES stent postdilated to 3.31 mm with the 80% stenosis being reduced to 0%.  RECOMMENDATION: Continue DAPT for at least a year. Medical therapy. The patient has intolerance to statins. Consider Zetia.    Echocardiogram 02/27/18 Study Conclusions  - Left ventricle: The cavity size was normal. Systolic function was normal. The estimated ejection fraction was in the range of 55% to 60%. Wall motion was normal; there were no regional wall motion abnormalities. There was an increased relative contribution of atrial contraction to ventricular filling. Doppler parameters are consistent with abnormal left ventricular relaxation (grade 1 diastolic dysfunction). Doppler parameters are consistent with high ventricular filling pressure. - Aortic valve: Trileaflet; mildly thickened, moderately calcified leaflets. - Mitral valve: Calcified annulus. - Right ventricle: The cavity size was  mildly dilated. Wall thickness was normal. Systolic function was mildly reduced.   ASSESSMENT & PLAN:    1. CAD s/p DES to RCA and now DES to circumflex -He denies  any substernal chest pain or pressure.  He has progressive worsening dyspnea on exertion.  See below.  EKG without acute ischemic changes.  Continue aspirin, Plavix and beta-blocker.  2. HLD - 02/27/2018: Cholesterol 148; HDL 45; LDL Cholesterol 61; Triglycerides 208; VLDL 42  - Statin intolerance. Declined lipid lowering medicines such as ezetimibe or fibrates at this point. He will work on modifying his diet and work on lifestyle modification.  3. OSA -Compliant with CPAP.   4.  Dyspnea on exertion/acute hypoxia  -Patient was evaluated by Dr. Burt Knack today.  He did not think his symptoms are related to angina.  EKG is reassuring.  With ambulation his oxygen saturation was 78% after walking for about 50-100 feet.  He had negative d-dimer in hospital.  No travel since then.  Vital signs stable. Given hypoxia will admit to hospital and get stat coronary CT angiogram to r/o PE. He is also very weak and fatigue. Consider pulmonary/IM consult based on findings. Check BMET, CBC and BNP.   I have spent over 45 minutes, face-to-face with the patient and over 50% was spent in counseling and/or coordination of care. This includes review of prior records, care discussion with Dr. Burt Knack, need of magine agent, developing & discussing different plans, discussion of disease and its complications, life style changes.    Medication Adjustments/Labs and Tests Ordered: Current medicines are reviewed at length with the patient today.  Concerns regarding medicines are outlined above.  Medication changes, Labs and Tests ordered today are listed in the Patient Instructions below.    Jarrett Soho, [place]  03/18/2018 12:21 PM    [facility] [place], [place], [place]  [address] Phone: [phone]; Fax:  [phone]

## 2018-03-18 ENCOUNTER — Other Ambulatory Visit: Payer: Self-pay

## 2018-03-18 ENCOUNTER — Inpatient Hospital Stay (HOSPITAL_COMMUNITY): Payer: Medicare HMO

## 2018-03-18 ENCOUNTER — Encounter: Payer: Self-pay | Admitting: Physician Assistant

## 2018-03-18 ENCOUNTER — Inpatient Hospital Stay (HOSPITAL_COMMUNITY)
Admission: AD | Admit: 2018-03-18 | Discharge: 2018-03-20 | DRG: 189 | Disposition: A | Discharge status: Home / Self Care | Payer: Medicare HMO | Source: Ambulatory Visit | Attending: Cardiovascular Disease | Admitting: Cardiovascular Disease

## 2018-03-18 ENCOUNTER — Ambulatory Visit (INDEPENDENT_AMBULATORY_CARE_PROVIDER_SITE_OTHER): Payer: Medicare HMO | Admitting: Physician Assistant

## 2018-03-18 VITALS — BP 108/74 | HR 74 | Ht 67.0 in | Wt 217.0 lb

## 2018-03-18 DIAGNOSIS — G4733 Obstructive sleep apnea (adult) (pediatric): Secondary | ICD-10-CM | POA: Diagnosis present

## 2018-03-18 DIAGNOSIS — I252 Old myocardial infarction: Secondary | ICD-10-CM

## 2018-03-18 DIAGNOSIS — E7800 Pure hypercholesterolemia, unspecified: Secondary | ICD-10-CM | POA: Diagnosis not present

## 2018-03-18 DIAGNOSIS — Z7982 Long term (current) use of aspirin: Secondary | ICD-10-CM

## 2018-03-18 DIAGNOSIS — R0602 Shortness of breath: Secondary | ICD-10-CM | POA: Diagnosis present

## 2018-03-18 DIAGNOSIS — J9601 Acute respiratory failure with hypoxia: Secondary | ICD-10-CM | POA: Diagnosis not present

## 2018-03-18 DIAGNOSIS — Z6833 Body mass index (BMI) 33.0-33.9, adult: Secondary | ICD-10-CM

## 2018-03-18 DIAGNOSIS — Z888 Allergy status to other drugs, medicaments and biological substances status: Secondary | ICD-10-CM

## 2018-03-18 DIAGNOSIS — I249 Acute ischemic heart disease, unspecified: Secondary | ICD-10-CM | POA: Diagnosis not present

## 2018-03-18 DIAGNOSIS — I2510 Atherosclerotic heart disease of native coronary artery without angina pectoris: Secondary | ICD-10-CM | POA: Diagnosis present

## 2018-03-18 DIAGNOSIS — Z9289 Personal history of other medical treatment: Secondary | ICD-10-CM

## 2018-03-18 DIAGNOSIS — Z87442 Personal history of urinary calculi: Secondary | ICD-10-CM

## 2018-03-18 DIAGNOSIS — Z79899 Other long term (current) drug therapy: Secondary | ICD-10-CM

## 2018-03-18 DIAGNOSIS — K589 Irritable bowel syndrome without diarrhea: Secondary | ICD-10-CM | POA: Diagnosis present

## 2018-03-18 DIAGNOSIS — R0600 Dyspnea, unspecified: Secondary | ICD-10-CM | POA: Diagnosis not present

## 2018-03-18 DIAGNOSIS — R0609 Other forms of dyspnea: Secondary | ICD-10-CM | POA: Diagnosis not present

## 2018-03-18 DIAGNOSIS — E663 Overweight: Secondary | ICD-10-CM | POA: Diagnosis present

## 2018-03-18 DIAGNOSIS — M109 Gout, unspecified: Secondary | ICD-10-CM | POA: Diagnosis present

## 2018-03-18 DIAGNOSIS — E785 Hyperlipidemia, unspecified: Secondary | ICD-10-CM | POA: Diagnosis not present

## 2018-03-18 DIAGNOSIS — K449 Diaphragmatic hernia without obstruction or gangrene: Secondary | ICD-10-CM | POA: Diagnosis present

## 2018-03-18 DIAGNOSIS — Z9989 Dependence on other enabling machines and devices: Secondary | ICD-10-CM | POA: Diagnosis not present

## 2018-03-18 DIAGNOSIS — J302 Other seasonal allergic rhinitis: Secondary | ICD-10-CM | POA: Diagnosis present

## 2018-03-18 DIAGNOSIS — Z7902 Long term (current) use of antithrombotics/antiplatelets: Secondary | ICD-10-CM | POA: Diagnosis not present

## 2018-03-18 DIAGNOSIS — Z955 Presence of coronary angioplasty implant and graft: Secondary | ICD-10-CM

## 2018-03-18 DIAGNOSIS — N200 Calculus of kidney: Secondary | ICD-10-CM | POA: Diagnosis not present

## 2018-03-18 LAB — CBC
HCT: 46 % (ref 39.0–52.0)
Hemoglobin: 15.4 g/dL (ref 13.0–17.0)
MCH: 32 pg (ref 26.0–34.0)
MCHC: 33.5 g/dL (ref 30.0–36.0)
MCV: 95.6 fL (ref 78.0–100.0)
Platelets: 219 10*3/uL (ref 150–400)
RBC: 4.81 MIL/uL (ref 4.22–5.81)
RDW: 12.8 % (ref 11.5–15.5)
WBC: 5.1 10*3/uL (ref 4.0–10.5)

## 2018-03-18 LAB — BASIC METABOLIC PANEL
Anion gap: 6 (ref 5–15)
BUN: 15 mg/dL (ref 6–20)
CO2: 27 mmol/L (ref 22–32)
CREATININE: 0.99 mg/dL (ref 0.61–1.24)
Calcium: 10.2 mg/dL (ref 8.9–10.3)
Chloride: 106 mmol/L (ref 101–111)
GFR calc Af Amer: >60 mL/min (ref >60)
Glucose, Bld: 114 mg/dL — ABNORMAL HIGH (ref 65–99)
Potassium: 4.2 mmol/L (ref 3.5–5.1)
SODIUM: 139 mmol/L (ref 135–145)

## 2018-03-18 LAB — GLUCOSE, CAPILLARY: Glucose-Capillary: 127 mg/dL — ABNORMAL HIGH (ref 65–99)

## 2018-03-18 LAB — TROPONIN I: Troponin I: <0.03 ng/mL (ref <0.03)

## 2018-03-18 LAB — BRAIN NATRIURETIC PEPTIDE: B NATRIURETIC PEPTIDE 5: 20.8 pg/mL (ref 0.0–100.0)

## 2018-03-18 MED ORDER — ASPIRIN EC 81 MG PO TBEC
81.0000 mg | DELAYED_RELEASE_TABLET | Freq: Every day | ORAL | Status: DC
  Administered 2018-03-19 – 2018-03-20 (×2): 81 mg via ORAL
  Filled 2018-03-18 (×2): qty 1

## 2018-03-18 MED ORDER — ONDANSETRON HCL 4 MG/2ML IJ SOLN: 4.0000 mg | Freq: Four times a day (QID) | INTRAMUSCULAR | Status: DC | PRN

## 2018-03-18 MED ORDER — IOPAMIDOL (ISOVUE-370) INJECTION 76%
INTRAVENOUS | Status: AC
  Administered 2018-03-18: 100 mL
  Filled 2018-03-18: qty 100

## 2018-03-18 MED ORDER — TAMSULOSIN HCL 0.4 MG PO CAPS: 0.4000 mg | ORAL_CAPSULE | ORAL | Status: DC | PRN

## 2018-03-18 MED ORDER — HEPARIN SODIUM (PORCINE) 5000 UNIT/ML IJ SOLN
5000.0000 [IU] | Freq: Three times a day (TID) | INTRAMUSCULAR | Status: DC
  Filled 2018-03-18: qty 1

## 2018-03-18 MED ORDER — CLOPIDOGREL BISULFATE 75 MG PO TABS
75.0000 mg | ORAL_TABLET | Freq: Every day | ORAL | Status: DC
  Administered 2018-03-19 – 2018-03-20 (×2): 75 mg via ORAL
  Filled 2018-03-18 (×2): qty 1

## 2018-03-18 MED ORDER — NITROGLYCERIN 0.4 MG SL SUBL: 0.4000 mg | SUBLINGUAL_TABLET | SUBLINGUAL | Status: DC | PRN

## 2018-03-18 MED ORDER — FLUTICASONE PROPIONATE 50 MCG/ACT NA SUSP
1.0000 | Freq: Two times a day (BID) | NASAL | Status: DC
  Filled 2018-03-18: qty 16

## 2018-03-18 MED ORDER — ACETAMINOPHEN 325 MG PO TABS: 650.0000 mg | ORAL_TABLET | ORAL | Status: DC | PRN

## 2018-03-18 MED ORDER — ADULT MULTIVITAMIN W/MINERALS CH
1.0000 | ORAL_TABLET | Freq: Every day | ORAL | Status: DC
  Administered 2018-03-19 – 2018-03-20 (×2): 1 via ORAL
  Filled 2018-03-18 (×2): qty 1

## 2018-03-18 MED ORDER — ALLOPURINOL 300 MG PO TABS
300.0000 mg | ORAL_TABLET | Freq: Every day | ORAL | Status: DC
  Administered 2018-03-19 – 2018-03-20 (×2): 300 mg via ORAL
  Filled 2018-03-18 (×2): qty 1

## 2018-03-18 MED ORDER — LORATADINE 10 MG PO TABS
10.0000 mg | ORAL_TABLET | Freq: Every day | ORAL | Status: DC
  Administered 2018-03-18 – 2018-03-19 (×2): 10 mg via ORAL
  Filled 2018-03-18 (×2): qty 1

## 2018-03-18 MED ORDER — METOPROLOL SUCCINATE ER 25 MG PO TB24
12.5000 mg | ORAL_TABLET | Freq: Every day | ORAL | Status: DC
  Administered 2018-03-19 – 2018-03-20 (×2): 12.5 mg via ORAL
  Filled 2018-03-18 (×2): qty 1

## 2018-03-18 NOTE — Consult Note (Signed)
Name: Timothy Wilcox MRN: [id number] DOB: [birth year]    ADMISSION DATE:  03/18/2018 CONSULTATION DATE: March 18, 2018  REFERRING MD : Dr. Burt Knack cardiology  CHIEF COMPLAINT: Dyspnea  HISTORY OF PRESENT ILLNESS: 77 year old man with past medical history as below, which is significant for coronary artery disease, hyperlipidemia, renal insufficiency, and obstructive sleep apnea.  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 He was readmitted in May 2019 with complaints of persistent dyspnea.  He again underwent left heart catheterization which demonstrated a proximal circumflex lesion which was treated with a drug-eluting stent. This treatment did not offer him symptomatic relief. During outpatient follow up 6/11he complained again of progressive dyspnea. He ambulated a short distance on room air and desaturated into the high 70s. He was directly admitted to [facility] and PCCM has been asked to evaluate.   SIGNIFICANT EVENTS  6/11 admit for hypoxia  STUDIES:  CTA chest 6/11 > No evidence of PE, peribronchial thickening without infiltrated. Small hiatal hernia.  Echo bubble study > V/Q > PFT > RUQ [place] >  PAST MEDICAL HISTORY :   has a past medical history of Arthritis, CAD (coronary artery disease) (02/26/2018), Gout, History of acute inferior wall MI (04/03/2017), Hyperlipemia, IBS (irritable bowel syndrome), Medication intolerance, Renal disorder, Seasonal allergies, Sinus congestion, and Sleep apnea.  has a past surgical history that includes Colonoscopy; Incision and drainage (2011); Toe Debridement; Shoulder arthroscopy with rotator cuff repair and subacromial decompression (Right, 01/29/2013); LEFT HEART CATH AND CORONARY ANGIOGRAPHY (N/A, 04/03/2017); CORONARY STENT INTERVENTION (N/A, 04/03/2017); LEFT HEART CATH AND CORONARY ANGIOGRAPHY (N/A, 02/27/2018); and CORONARY STENT  INTERVENTION (N/A, 02/27/2018). Prior to Admission medications   Medication Sig Start Date End Date Taking? Authorizing Provider  allopurinol (ZYLOPRIM) 300 MG tablet Take 300 mg by mouth daily.   Yes [provider]  aspirin EC 81 MG tablet Take 81 mg by mouth daily.   Yes [provider]  clopidogrel (PLAVIX) 75 MG tablet Take 1 tablet (75 mg total) by mouth daily. 04/30/17  Yes Sherren Mocha, MD  fluticasone [facility]) 50 MCG/ACT nasal spray Place 1 spray into both nostrils 2 (two) times daily.   Yes [provider]  ibuprofen (ADVIL,MOTRIN) 200 MG tablet Take 400 mg by mouth every 6 (six) hours as needed for headache or moderate pain.    Yes [provider]  loratadine (CLARITIN) 10 MG tablet Take 10 mg by mouth 2 (two) times daily.    Yes [provider]  metoprolol succinate (TOPROL XL) 25 MG 24 hr tablet Take 0.5 tablets (12.5 mg total) by mouth daily. 05/09/17 05/09/18 Yes Sherren Mocha, MD  Multiple Vitamin (MULTIVITAMIN WITH MINERALS) TABS Take 1 tablet by mouth daily.   Yes [provider]  nitroGLYCERIN (NITROSTAT) 0.4 MG SL tablet Place 1 tablet (0.4 mg total) under the tongue every 5 (five) minutes x 3 doses as needed for chest pain. 04/05/17  Yes Bhagat, Bhavinkumar, PA  Probiotic Product (DAILY PROBIOTIC PO) Take 1 tablet by mouth daily.   Yes [provider]  sildenafil (VIAGRA) 100 MG tablet Take 100 mg by mouth daily as needed for erectile dysfunction.    Yes [provider]  tamsulosin (FLOMAX) 0.4 MG CAPS Take 0.4 mg by mouth as needed (used if he has a kidney stone).  03/24/12  Yes Hyman Bible, PA-C   Allergies  Allergen Reactions  . Brilinta [Ticagrelor]  Shortness Of Breath and Other (See Comments)    Dizziness, weakness/Caused hospital admission  . Statins Other (See Comments)    Pain in joints, cant move knees  . Dicyclomine Other (See Comments)    Interfered with sleep,nervous  . Tagamet  [Cimetidine] Other (See Comments)    Gynecomastia     FAMILY HISTORY:  family history is not on file. SOCIAL HISTORY:  reports that he has never smoked. He has never used smokeless tobacco. He reports that he drinks alcohol. He reports that he does not use drugs.  REVIEW OF SYSTEMS:   Bolds are positive  Constitutional: weight loss, gain, night sweats, Fevers, chills, fatigue .  HEENT: headaches, Sore throat, sneezing, nasal congestion, post nasal drip, Difficulty swallowing, Tooth/dental problems, visual complaints visual changes, ear ache CV:  chest pain, radiates:,Orthopnea, PND, swelling in lower extremities, dizziness, palpitations, syncope.  GI  heartburn, indigestion, abdominal pain, nausea, vomiting, diarrhea, change in bowel habits, loss of appetite, bloody stools.  Resp: cough non-productive chronic for years.Due to post nasal drainage. , productive: , hemoptysis, dyspnea, chest pain, pleuritic.  Skin: rash or itching or icterus GU: dysuria, change in color of urine, urgency or frequency. flank pain, hematuria  MS: joint pain or swelling. decreased range of motion  Psych: change in mood or affect. depression or anxiety.  Neuro: difficulty with speech, weakness, numbness, ataxia  .  SUBJECTIVE:   VITAL SIGNS: Temp:  [98.5 F (36.9 C)] 98.5 F (36.9 C) (06/11 1404) Pulse Rate:  [73-74] 73 (06/11 1404) Resp:  [16] 16 (06/11 1404) BP: (108-144)/(71-74) 144/71 (06/11 1404) SpO2:  [100 %] 100 % (06/11 1404) Weight:  [97.6 kg (215 lb 3.2 oz)-98.4 kg (217 lb)] 97.6 kg (215 lb 3.2 oz) (06/11 1355)  PHYSICAL EXAMINATION: General:  Overweight male in NAD Neuro:  Alert, oriented, non-focal HEENT:  [place]/AT, PERRL, no JVD Cardiovascular:  RRR, no MRG Lungs:  Clear bilateral breath sounds Abdomen:  Soft, non-tender, non-distended. Protuberant Musculoskeletal:  No acute deformity or ROM limitation Skin:  Grossly intact.   Recent Labs  Lab 03/18/18 1413  NA 139  K 4.2  CL 106   CO2 27  BUN 15  CREATININE 0.99  GLUCOSE 114*   Recent Labs  Lab 03/18/18 1413  HGB 15.4  HCT 46.0  WBC 5.1  PLT 219   Ct Angio Chest Pe W Or Wo Contrast  Result Date: 03/18/2018 CLINICAL DATA:  Increased shortness of breath and dizziness for 3 days, high pretest probability for pulmonary embolism, history hypertension, coronary artery disease post STEMI, irritable bowel syndrome, gout EXAM: CT ANGIOGRAPHY CHEST WITH CONTRAST TECHNIQUE: Multidetector CT imaging of the chest was performed using the standard protocol during bolus administration of intravenous contrast. Multiplanar CT image reconstructions and MIPs were obtained to evaluate the vascular anatomy. CONTRAST:  115mL ISOVUE-370 IOPAMIDOL (ISOVUE-370) INJECTION 76% IV COMPARISON:  None FINDINGS: Cardiovascular: Scattered atherosclerotic calcifications aorta, proximal great vessels and coronary arteries. Aorta normal caliber without aneurysm. Pulmonary arteries well opacified and patent. No evidence of pulmonary embolism. No pericardial effusion. Mediastinum/Nodes: Small hiatal hernia. Esophagus unremarkable. Base of cervical region normal appearance. No thoracic adenopathy. Tiny calcified mediastinal lymph nodes noted. Lungs/Pleura: Peribronchial thickening. No acute infiltrate, pleural effusion or pneumothorax. Upper Abdomen: Visualized upper abdomen unremarkable Musculoskeletal: Unremarkable Review of the MIP images confirms the above findings. IMPRESSION: No evidence of pulmonary embolism. Peribronchial thickening without infiltrate. Scattered atherosclerotic disease changes including coronary arteries. Small hiatal hernia. Aortic Atherosclerosis (ICD10-I70.0). Electronically Signed   By: Elta Gu[place]  Thornton Papas M.D.   On: 03/18/2018 16:21    ASSESSMENT / PLAN:  Hypoxia: cardiac history, however, symptoms did not improve with recent stent. No evidence of volume overload on exam or imaging. CXR and CTA clear. No PE. Differential includes chronic  venous thromboembolism and intracardiac/pulmonary shunting.   Plan: Supplemental O2 at tolerated to keep SpO2 > 92% Ambulate with pulse oximetry V/Q scan Bubble study PFT RUQ ultrasound.  Interesting case  Will continue to follow.    Georgann Housekeeper, AGACNP-BC [facility] Pulmonology/Critical Care Pager [phone] or [phone]  03/18/2018 6:28 PM

## 2018-03-18 NOTE — Patient Instructions (Addendum)
Your physician recommends that you continue on your current medications as directed. Please refer to the Current Medication list given to you today.  Your physician recommends that you return for lab work in: TODAY CBC BMET BNP AND D DIMER  You have been referred to [place]   Your physician recommends that you schedule a follow-up appointment in:  [place]

## 2018-03-18 NOTE — H&P (Signed)
Cardiology Office Note    Date:  03/18/2018   ID:  Rondey, Fallen [birth year], MRN [id number]  PCP:  Hulan Fess, MD            Cardiologist:  Sherren Mocha, MD  Chief Complaint: Hospital follow up  History of Present Illness:   Timothy Wilcox is a 77 y.o. malewith coronary artery disease status post inferior ST elevation myocardial infarction in June 2018 treated with a DES to the distal RCA and recent DES to proximal circumflex, hyperlipidemia, sleep apnea who presents for hospital follow up.   He had significant side effects to Ticagrelor prompting transition to Clopidogrel. Has been evaluated by the lipid clinic due to intolerance to statin therapy.  Admitted to hospital from clinic 02/2018 for DOE. He was taken to the cath lab on 02/27/18 and found to have patent RCA stent and 80% stenosed proximal circumflex. He underwent successful PCI to the circwith the patient participating in the Optimize Study stent protocol with ultimate insertion of a 3.0 x 13 mm sirolimus DES. Declined lipid lowering medicines such as ezetimibe or fibrates at this point. He will work on modifying his diet and work on lifestyle modification.  Here today for follow-up.  Patient felt well for 3 to 4 days post PCI.  Since then he has progressive worsening dyspnea on exertion, similar to hospital presentation.  He denies any substernal chest pain/pressure.  No orthopnea, PND, syncope, dizziness or lower extremity edema.  Currently feels like hypoglycemic.  Blood sugar 138.  Given crackers and coke.       Past Medical History:  Diagnosis Date  . Arthritis   . CAD (coronary artery disease) 02/26/2018   A. Inf STEMI 6/18: LHC - pLCx 60, dRCA 100, EF 45-50 >> PCI:  DES to RCA // B. Echo 8/18: EF 60-65, nwm, grade 1 dd, MAC. C. LHD DES to circ, patnet RCA stent  . Gout   . 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 Hyperlipemia    intol to some statins  . IBS (irritable  bowel syndrome)   . Medication intolerance    severe SOB due to Brilinta >> changed to Plavix  . Renal disorder    kidney stones  . Seasonal allergies   . Sinus congestion    chronic  . Sleep apnea    uses a c-pap         Past Surgical History:  Procedure Laterality Date  . COLONOSCOPY    . CORONARY STENT INTERVENTION N/A 04/03/2017   Procedure: Coronary Stent Intervention;  Surgeon: Sherren Mocha, MD;  Location: [facility];  Service: Cardiovascular;  Laterality: N/A;  . CORONARY STENT INTERVENTION N/A 02/27/2018   Procedure: CORONARY STENT INTERVENTION;  Surgeon: Troy Sine, MD;  Location: [facility];  Service: Cardiovascular;  Laterality: N/A;  . INCISION AND DRAINAGE  2011   infected finger  . LEFT HEART CATH AND CORONARY ANGIOGRAPHY N/A 04/03/2017   Procedure: Left Heart Cath and Coronary Angiography;  Surgeon: Sherren Mocha, MD;  Location: [facility];  Service: Cardiovascular;  Laterality: N/A;  . LEFT HEART CATH AND CORONARY ANGIOGRAPHY N/A 02/27/2018   Procedure: LEFT HEART CATH AND CORONARY ANGIOGRAPHY;  Surgeon: Troy Sine, MD;  Location: [facility];  Service: Cardiovascular;  Laterality: N/A;  . SHOULDER ARTHROSCOPY WITH ROTATOR CUFF REPAIR AND SUBACROMIAL DECOMPRESSION Right 01/29/2013   Procedure: RIGHT SHOULDER ARTHROSCOPY WITH SUBACROMIAL DECOMPRESSION, THREE TENDON ROTATOR CUFF  REPAIR;  Surgeon: Cammie Sickle., MD;  Location: [facility];  Service: Orthopedics;  Laterality: Right;  . TOE DEBRIDEMENT     rt toe cyst    Current Medications:        Prior to Admission medications   Medication Sig Start Date End Date Taking? Authorizing Provider  allopurinol (ZYLOPRIM) 300 MG tablet Take 300 mg by mouth daily.    [provider]  aspirin EC 81 MG tablet Take 81 mg by mouth daily.    [provider]  clopidogrel (PLAVIX) 75 MG tablet Take 1 tablet (75 mg total)  by mouth daily. 04/30/17   Sherren Mocha, MD  fluticasone [facility]) 50 MCG/ACT nasal spray Place 1 spray into both nostrils 2 (two) times daily.    [provider]  ibuprofen (ADVIL,MOTRIN) 200 MG tablet Take 400 mg by mouth every 6 (six) hours as needed for headache or moderate pain.     [provider]  loratadine (CLARITIN) 10 MG tablet Take 10 mg by mouth 2 (two) times daily.     [provider]  metoprolol succinate (TOPROL XL) 25 MG 24 hr tablet Take 0.5 tablets (12.5 mg total) by mouth daily. 05/09/17 05/09/18  Sherren Mocha, MD  Multiple Vitamin (MULTIVITAMIN WITH MINERALS) TABS Take 1 tablet by mouth daily.    [provider]  nitroGLYCERIN (NITROSTAT) 0.4 MG SL tablet Place 1 tablet (0.4 mg total) under the tongue every 5 (five) minutes x 3 doses as needed for chest pain. 04/05/17   Leanor Kail, PA  Probiotic Product (DAILY PROBIOTIC PO) Take 1 tablet by mouth daily.    [provider]  sildenafil (VIAGRA) 100 MG tablet Take 100 mg by mouth daily as needed for erectile dysfunction.     [provider]  tamsulosin (FLOMAX) 0.4 MG CAPS Take 0.4 mg by mouth as needed (used if her has a kidney stone). 03/24/12   Hyman Bible, PA-C    Allergies:   Brilinta [ticagrelor]; Statins; Dicyclomine; and Tagamet [cimetidine]   Social History        Socioeconomic History  . Marital status: Married    Spouse name: Not on file  . Number of children: Not on file  . Years of education: Not on file  . Highest education level: Not on file  Occupational History  . Not on file  Social Needs  . Financial resource strain: Not on file  . Food insecurity:    Worry: Not on file    Inability: Not on file  . Transportation needs:    Medical: Not on file    Non-medical: Not on file  Tobacco Use  . Smoking status: Never Smoker  . Smokeless tobacco: Never Used  Substance and Sexual Activity  . Alcohol  use: Yes    Comment: a bottle of win every night  . Drug use: No  . Sexual activity: Not on file  Lifestyle  . Physical activity:    Days per week: Not on file    Minutes per session: Not on file  . Stress: Not on file  Relationships  . Social connections:    Talks on phone: Not on file    Gets together: Not on file    Attends religious service: Not on file    Active member of club or organization: Not on file    Attends meetings of clubs or organizations: Not on file    Relationship status: Not on file  Other Topics Concern  .  Not on file  Social History Narrative  . Not on file     Family History:  The patient's family history is not on file.   ROS:   Please see the history of present illness.    ROS All other systems reviewed and are negative.   PHYSICAL EXAM:   VS:  BP 108/74   Pulse 74   Ht _0  (1.702 m)   Wt 217 lb (98.4 kg)   BMI 33.99 kg/m    GEN: Well nourished, well developed, in no acute distress  HEENT: normal  Neck: no JVD, carotid bruits, or masses Cardiac:RRR; no murmurs, rubs, or gallops,no edema  Respiratory:  clear to auscultation bilaterally, normal work of breathing GI: soft, nontender, nondistended, + BS MS: no deformity or atrophy  Skin: warm and dry, no rash Neuro:  Alert and Oriented x 3, Strength and sensation are intact Psych: euthymic mood, full affect     Wt Readings from Last 3 Encounters:  03/18/18 217 lb (98.4 kg)  02/28/18 222 lb 7.1 oz (100.9 kg)  02/26/18 216 lb (98 kg)      Studies/Labs Reviewed:   EKG:  EKG is ordered today.  The ekg ordered today demonstrates normal sinus rhythm.  No EKG change compared to prior  Recent Labs: 02/26/2018: ALT 43; B Natriuretic Peptide 13.7; TSH 1.471 02/28/2018: BUN 17; Creatinine, Ser 0.97; Hemoglobin 13.7; Platelets 203; Potassium 4.5; Sodium 140   Lipid Panel Labs(Brief)          Component Value Date/Time   CHOL 148 02/27/2018 0220   CHOL 165  07/15/2017 1003   TRIG 208 (H) 02/27/2018 0220   HDL 45 02/27/2018 0220   HDL 63 07/15/2017 1003   CHOLHDL 3.3 02/27/2018 0220   VLDL 42 (H) 02/27/2018 0220   LDLCALC 61 02/27/2018 0220   LDLCALC 55 07/15/2017 1003      Additional studies/ records that were reviewed today include:   CORONARY STENT INTERVENTION  LEFT HEART CATH AND CORONARY ANGIOGRAPHY5/23/19  Conclusion    Previously placed Dist RCA stent (unknown type) is widely patent.  Prox Cx lesion is 80% stenosed.  Post intervention, there is a 0% residual stenosis.  The left ventricular systolic function is normal.  A stent was successfully placed.  Preserved to low normal LV function with minimal residual mid to basal inferior hypocontractility and an ejection fraction of 50%. LVEDP 10 mmHg.  Coronary obstructive disease with a normal LAD, normal ramus intermediate vessel, progressive 80% proximal left circumflex stenosis, and widely patent previously placed distal RCA stent at the site of prior inferior STEMI.  Successful PCI to the 80% left circumflex stenosis with the patient participating in the [place] stent protocol with ultimate insertion of a 3.0 x 13 mm sirolimus DES stent postdilated to 3.31 mm with the 80% stenosis being reduced to 0%.  RECOMMENDATION: Continue DAPT for at least a year. Medical therapy. The patient has intolerance to statins. Consider Zetia.    Echocardiogram 02/27/18 Study Conclusions  - Left ventricle: The cavity size was normal. Systolic function was normal. The estimated ejection fraction was in the range of 55% to 60%. Wall motion was normal; there were no regional wall motion abnormalities. There was an increased relative contribution of atrial contraction to ventricular filling. Doppler parameters are consistent with abnormal left ventricular relaxation (grade 1 diastolic dysfunction). Doppler parameters are consistent with high  ventricular filling pressure. - Aortic valve: Trileaflet; mildly thickened, moderately calcified leaflets. - Mitral valve: Calcified  annulus. - Right ventricle: The cavity size was mildly dilated. Wall thickness was normal. Systolic function was mildly reduced.   ASSESSMENT & PLAN:    1. CAD s/p DES to RCA and now DES to circumflex -He denies any substernal chest pain or pressure.  He has progressive worsening dyspnea on exertion.  See below.  EKG without acute ischemic changes.  Continue aspirin, Plavix and beta-blocker.  2. HLD - 02/27/2018: Cholesterol 148; HDL 45; LDL Cholesterol 61; Triglycerides 208; VLDL 42  - Statin intolerance. Declined lipid lowering medicines such as ezetimibe or fibrates at this point. He will work on modifying his diet and work on lifestyle modification.  3. OSA -Compliant with CPAP.   4.  Dyspnea on exertion/acute hypoxia  -Patient was evaluated by Dr. Burt Knack today.  He did not think his symptoms are related to angina.  EKG is reassuring.  With ambulation his oxygen saturation was 78% after walking for about 50-100 feet.  He had negative d-dimer in hospital.  No travel since then.  Vital signs stable. Given hypoxia will admit to hospital and get stat coronary CT angiogram to r/o PE. He is also very weak and fatigue. Consider pulmonary/IM consult based on findings. Check BMET, CBC and BNP.   I have spent over 45 minutes, face-to-face with the patient and over 50% was spent in counseling and/or coordination of care. This includes review of prior records, care discussion with Dr. Burt Knack, need of magine agent, developing & discussing different plans, discussion of disease and its complications, life style changes.    Medication Adjustments/Labs and Tests Ordered: Current medicines are reviewed at length with the patient today.  Concerns regarding medicines are outlined above.  Medication changes, Labs and Tests ordered today are listed in the  Patient Instructions below.    Jarrett Soho, [place]  03/18/2018 12:21 PM    [facility] [address], [place], [place]  [address] Phone: [phone]; Fax: [phone]

## 2018-03-19 ENCOUNTER — Inpatient Hospital Stay (HOSPITAL_COMMUNITY): Payer: Medicare HMO

## 2018-03-19 ENCOUNTER — Encounter (HOSPITAL_COMMUNITY): Payer: Self-pay | Admitting: *Deleted

## 2018-03-19 DIAGNOSIS — R0600 Dyspnea, unspecified: Secondary | ICD-10-CM

## 2018-03-19 DIAGNOSIS — E785 Hyperlipidemia, unspecified: Secondary | ICD-10-CM

## 2018-03-19 DIAGNOSIS — I2510 Atherosclerotic heart disease of native coronary artery without angina pectoris: Secondary | ICD-10-CM

## 2018-03-19 LAB — TROPONIN I: Troponin I: <0.03 ng/mL (ref <0.03)

## 2018-03-19 MED ORDER — LORATADINE 10 MG PO TABS
10.0000 mg | ORAL_TABLET | Freq: Two times a day (BID) | ORAL | Status: DC
  Administered 2018-03-19 – 2018-03-20 (×2): 10 mg via ORAL
  Filled 2018-03-19 (×2): qty 1

## 2018-03-19 MED ORDER — TECHNETIUM TC 99M DIETHYLENETRIAME-PENTAACETIC ACID
32.0000 | Freq: Once | INTRAVENOUS | Status: AC | PRN
  Administered 2018-03-19: 32 via INTRAVENOUS

## 2018-03-19 MED ORDER — TECHNETIUM TO 99M ALBUMIN AGGREGATED
4.0000 | Freq: Once | INTRAVENOUS | Status: AC | PRN
  Administered 2018-03-19: 4 via INTRAVENOUS

## 2018-03-19 NOTE — Progress Notes (Signed)
Name: Timothy Wilcox MRN: [id number] DOB: Jul 17, 1941    ADMISSION DATE:  03/18/2018 CONSULTATION DATE: March 18, 2018  REFERRING MD : Dr. Burt Knack, Cardiology  CHIEF COMPLAINT: Dyspnea  HISTORY OF PRESENT ILLNESS: 77 year old man with past medical history as below, which is significant for coronary artery disease, hyperlipidemia, renal insufficiency, and obstructive sleep apnea.  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 He was readmitted in May 2019 with complaints of persistent dyspnea.  He again underwent left heart catheterization which demonstrated a proximal circumflex lesion which was treated with a drug-eluting stent. This treatment did not offer him symptomatic relief. During outpatient follow up 6/11he complained again of progressive dyspnea. He ambulated a short distance on room air and desaturated into the high 70s. He was directly admitted to [facility] and PCCM has been asked to evaluate.   Pets: Has a dog.  No birds, farm animals Occupation: Retired from administration. Exposures: Does woodworking as a hobby.  Exposed to wood dust.  No other exposures. Smoking history: Never smoker Travel history: No significant travel Relevant family history: No significant family history of lung disease.  SIGNIFICANT EVENTS  6/11 admit for hypoxia  STUDIES:  CTA chest 6/11 > No evidence of PE, peribronchial thickening without infiltrated. Small hiatal hernia.  Echo bubble study 6/12> Negative for shunt V/Q 6/12> negative PFT > pending RUQ [place] 6/11> nonspecific increase in echogenicity.  Possible hepatic steatosis  PAST MEDICAL HISTORY :   has a past medical history of Arthritis, CAD (coronary artery disease) (02/26/2018), Gout, History of acute inferior wall MI (04/03/2017), Hyperlipemia, IBS (irritable bowel syndrome), Medication intolerance, Renal disorder, Seasonal  allergies, Sinus congestion, and Sleep apnea.  has a past surgical history that includes Colonoscopy; Incision and drainage (2011); Toe Debridement; Shoulder arthroscopy with rotator cuff repair and subacromial decompression (Right, 01/29/2013); LEFT HEART CATH AND CORONARY ANGIOGRAPHY (N/A, 04/03/2017); CORONARY STENT INTERVENTION (N/A, 04/03/2017); LEFT HEART CATH AND CORONARY ANGIOGRAPHY (N/A, 02/27/2018); and CORONARY STENT INTERVENTION (N/A, 02/27/2018). Prior to Admission medications   Medication Sig Start Date End Date Taking? Authorizing Provider  allopurinol (ZYLOPRIM) 300 MG tablet Take 300 mg by mouth daily.   Yes [provider]  aspirin EC 81 MG tablet Take 81 mg by mouth daily.   Yes [provider]  clopidogrel (PLAVIX) 75 MG tablet Take 1 tablet (75 mg total) by mouth daily. 04/30/17  Yes Sherren Mocha, MD  fluticasone [facility]) 50 MCG/ACT nasal spray Place 1 spray into both nostrils 2 (two) times daily.   Yes [provider]  ibuprofen (ADVIL,MOTRIN) 200 MG tablet Take 400 mg by mouth every 6 (six) hours as needed for headache or moderate pain.    Yes [provider]  loratadine (CLARITIN) 10 MG tablet Take 10 mg by mouth 2 (two) times daily.    Yes [provider]  metoprolol succinate (TOPROL XL) 25 MG 24 hr tablet Take 0.5 tablets (12.5 mg total) by mouth daily. 05/09/17 05/09/18 Yes Sherren Mocha, MD  Multiple Vitamin (MULTIVITAMIN WITH MINERALS) TABS Take 1 tablet by mouth daily.   Yes [provider]  nitroGLYCERIN (NITROSTAT) 0.4 MG SL tablet Place 1 tablet (0.4 mg total) under the tongue every 5 (five) minutes x 3 doses as needed for chest pain. 04/05/17  Yes Bhagat, Bhavinkumar, PA  Probiotic Product (DAILY PROBIOTIC PO) Take 1 tablet by mouth daily.   Yes [provider]  sildenafil (VIAGRA) 100 MG tablet Take 100 mg by mouth daily as needed for erectile dysfunction.    Yes [provider]  tamsulosin (FLOMAX)  0.4 MG CAPS Take 0.4 mg by mouth as needed (used if he has a kidney stone).  03/24/12  Yes Hyman Bible, PA-C   Allergies  Allergen Reactions  . Brilinta [Ticagrelor] Shortness Of Breath and Other (See Comments)    Dizziness, weakness/Caused hospital admission  . Statins Other (See Comments)    Pain in joints, cant move knees  . Dicyclomine Other (See Comments)    Interfered with sleep,nervous  . Tagamet [Cimetidine] Other (See Comments)    Gynecomastia     FAMILY HISTORY:  family history is not on file. SOCIAL HISTORY:  reports that he has never smoked. He has never used smokeless tobacco. He reports that he drinks alcohol. He reports that he does not use drugs.  REVIEW OF SYSTEMS:   All negative; except for those that are bolded, which indicate positives.  Constitutional: weight loss, weight gain, night sweats, fevers, chills, fatigue, weakness.  HEENT: headaches, sore throat, sneezing, nasal congestion, post nasal drip, difficulty swallowing, tooth/dental problems, visual complaints, visual changes, ear aches. Neuro: difficulty with speech, weakness, numbness, ataxia. CV:  chest pain, orthopnea, PND, swelling in lower extremities, dizziness, palpitations, syncope.  Resp: cough, hemoptysis, dyspnea, wheezing. GI: heartburn, indigestion, abdominal pain, nausea, vomiting, diarrhea, constipation, change in bowel habits, loss of appetite, hematemesis, melena, hematochezia.  GU: dysuria, change in color of urine, urgency or frequency, flank pain, hematuria. MSK: joint pain or swelling, decreased range of motion. Psych: change in mood or affect, depression, anxiety, suicidal ideations, homicidal ideations. Skin: rash, itching, bruising.    SUBJECTIVE:  Feels better today AM   VITAL SIGNS: Temp:  [97.3 F (36.3 C)-97.5 F (36.4 C)] 97.3 F (36.3 C) (06/12 0509) Pulse Rate:  [63-77] 65 (06/12 0800) BP: (106-127)/(55-92) 124/92 (06/12 0800) SpO2:  [98 %-99 %] 99 % (06/12  0509) Weight:  [214 lb 1.6 oz (97.1 kg)] 214 lb 1.6 oz (97.1 kg) (06/12 0509)  PHYSICAL EXAMINATION: Blood pressure 120/74, pulse 74, temperature 98.1 F (36.7 C), temperature source Oral, resp. rate 16, height 5\' 7"  (1.702 m), weight 214 lb 1.6 oz (97.1 kg), SpO2 97 %. Gen:      No acute distress HEENT:  EOMI, sclera anicteric Neck:     No masses; no thyromegaly Lungs:    Clear to auscultation bilaterally; normal respiratory effort CV:         Regular rate and rhythm; no murmurs Abd:      + bowel sounds; soft, non-tender; no palpable masses, no distension Ext:    No edema; adequate peripheral perfusion Skin:      Warm and dry; no rash Neuro: alert and oriented x 3 Psych: normal mood and affect  Recent Labs  Lab 03/18/18 1413  NA 139  K 4.2  CL 106  CO2 27  BUN 15  CREATININE 0.99  GLUCOSE 114*   Recent Labs  Lab 03/18/18 1413  HGB 15.4  HCT 46.0  WBC 5.1  PLT 219    ASSESSMENT / PLAN: 77 year old with coronary artery disease, OSA being evaluated for dyspnea, hypoxia of unclear etiology  I have reviewed the CT scan with Dr. Weber Cooks, radiologist.  It shows several thin-walled surface of unclear etiology with bronchial wall thickening.  There is no clear evidence of interstitial lung disease No PE on CT angiogram or VQ scan.  No evidence of shunt  on echocardiogram or cirrhosis on liver ultrasound Given the bronchial wall thickening he may have asthma although presentation is not typical. We will review the PFTs which is scheduled for tomorrow.  - Follow PFTs - Supplemental oxygen. Wean O2 as tolerated - Consider cardiopulmonary exercise test as an outpatient to further work-up etiology of dyspnea. - He may benefit from cardiopulmonary rehab and an exercise program.  Marshell Garfinkel MD  Pulmonary and Critical Care 03/19/2018, 5:46 PM

## 2018-03-19 NOTE — Progress Notes (Signed)
Progress Note  Patient Name: Timothy Wilcox Date of Encounter: 03/19/2018  Primary Cardiologist: Sherren Mocha, MD   Subjective   Continues to complain of SOB  Inpatient Medications    Scheduled Meds: . allopurinol  300 mg Oral Daily  . aspirin EC  81 mg Oral Daily  . clopidogrel  75 mg Oral Daily  . fluticasone  1 spray Each Nare BID  . heparin  5,000 Units Subcutaneous Q8H  . loratadine  10 mg Oral Daily  . metoprolol succinate  12.5 mg Oral Daily  . multivitamin with minerals  1 tablet Oral Daily   Continuous Infusions:  PRN Meds: acetaminophen, nitroGLYCERIN, ondansetron (ZOFRAN) IV   Vital Signs    Vitals:   03/18/18 1404 03/18/18 2026 03/19/18 0509 03/19/18 0800  BP: (!) 144/71 127/76 (!) 106/55 (!) 124/92  Pulse: 73 77 63 65  Resp: 16     Temp: 98.5 F (36.9 C) (!) 97.5 F (36.4 C) (!) 97.3 F (36.3 C)   TempSrc: Oral Oral Oral   SpO2: 100% 98% 99%   Weight:   214 lb 1.6 oz (97.1 kg)   Height:        Intake/Output Summary (Last 24 hours) at 03/19/2018 0907 Last data filed at 03/19/2018 0513 Gross per 24 hour  Intake -  Output 400 ml  Net -400 ml   Filed Weights   03/18/18 1355 03/19/18 0509  Weight: 215 lb 3.2 oz (97.6 kg) 214 lb 1.6 oz (97.1 kg)    Telemetry    NSR - Personally Reviewed  ECG    No new EKG changes to review - Personally Reviewed  Physical Exam   GEN: No acute distress.   Neck: No JVD Cardiac: RRR, no murmurs, rubs, or gallops.  Respiratory: Clear to auscultation bilaterally. GI: Soft, nontender, non-distended  MS: No edema; No deformity. Neuro:  Nonfocal  Psych: Normal affect   Labs    Chemistry Recent Labs  Lab 03/18/18 1413  NA 139  K 4.2  CL 106  CO2 27  GLUCOSE 114*  BUN 15  CREATININE 0.99  CALCIUM 10.2  GFRNONAA >60  GFRAA >60  ANIONGAP 6     Hematology Recent Labs  Lab 03/18/18 1413  WBC 5.1  RBC 4.81  HGB 15.4  HCT 46.0  MCV 95.6  MCH 32.0  MCHC 33.5  RDW 12.8  PLT 219     Cardiac Enzymes Recent Labs  Lab 03/18/18 1646 03/18/18 2201 03/19/18 0403  TROPONINI <0.03 <0.03 <0.03   No results for input(s): TROPIPOC in the last 168 hours.   BNP Recent Labs  Lab 03/18/18 1413  BNP 20.8     DDimer No results for input(s): DDIMER in the last 168 hours.   Radiology    Ct Angio Chest Pe W Or Wo Contrast  Result Date: 03/18/2018 CLINICAL DATA:  Increased shortness of breath and dizziness for 3 days, high pretest probability for pulmonary embolism, history hypertension, coronary artery disease post STEMI, irritable bowel syndrome, gout EXAM: CT ANGIOGRAPHY CHEST WITH CONTRAST TECHNIQUE: Multidetector CT imaging of the chest was performed using the standard protocol during bolus administration of intravenous contrast. Multiplanar CT image reconstructions and MIPs were obtained to evaluate the vascular anatomy. CONTRAST:  171mL ISOVUE-370 IOPAMIDOL (ISOVUE-370) INJECTION 76% IV COMPARISON:  None FINDINGS: Cardiovascular: Scattered atherosclerotic calcifications aorta, proximal great vessels and coronary arteries. Aorta normal caliber without aneurysm. Pulmonary arteries well opacified and patent. No evidence of pulmonary embolism. No pericardial effusion. Mediastinum/Nodes:  Small hiatal hernia. Esophagus unremarkable. Base of cervical region normal appearance. No thoracic adenopathy. Tiny calcified mediastinal lymph nodes noted. Lungs/Pleura: Peribronchial thickening. No acute infiltrate, pleural effusion or pneumothorax. Upper Abdomen: Visualized upper abdomen unremarkable Musculoskeletal: Unremarkable Review of the MIP images confirms the above findings. IMPRESSION: No evidence of pulmonary embolism. Peribronchial thickening without infiltrate. Scattered atherosclerotic disease changes including coronary arteries. Small hiatal hernia. Aortic Atherosclerosis (ICD10-I70.0). Electronically Signed   By: Lavonia Dana M.D.   On: 03/18/2018 16:21   US Abdomen Limited  Ruq  Result Date: 03/18/2018 CLINICAL DATA:  Rule out cirrhosis. EXAM: ULTRASOUND ABDOMEN LIMITED RIGHT UPPER QUADRANT COMPARISON:  None. FINDINGS: Gallbladder: No gallstones or wall thickening visualized. No sonographic Murphy sign noted by sonographer. Common bile duct: Diameter: 6.7 mm Liver: Increased echogenicity with no focal mass. Portal vein is patent on color Doppler imaging with normal direction of blood flow towards the liver. IMPRESSION: 1. The common bile duct measures 6.7 mm which is borderline in a patient of this age. Recommend clinical correlation and correlation with labs. 2. Increased echogenicity in the liver is nonspecific but often seen with hepatic steatosis. No nodular contour to definitively suggest cirrhosis. 3. Nonobstructive 7 mm stone in the right kidney. Electronically Signed   By: Dorise Bullion III M.D   On: 03/18/2018 22:28    Cardiac Studies   2D echo 02/27/2018 Study Conclusions  - Left ventricle: The cavity size was normal. Systolic function was   normal. The estimated ejection fraction was in the range of 55%   to 60%. Wall motion was normal; there were no regional wall   motion abnormalities. There was an increased relative   contribution of atrial contraction to ventricular filling.   Doppler parameters are consistent with abnormal left ventricular   relaxation (grade 1 diastolic dysfunction). Doppler parameters   are consistent with high ventricular filling pressure. - Aortic valve: Trileaflet; mildly thickened, moderately calcified   leaflets. - Mitral valve: Calcified annulus. - Right ventricle: The cavity size was mildly dilated. Wall   thickness was normal. Systolic function was mildly reduced.  Cardiac Cath 02/27/2018 Conclusion     Previously placed Dist RCA stent (unknown type) is widely patent.  Prox Cx lesion is 80% stenosed.  Post intervention, there is a 0% residual stenosis.  The left ventricular systolic function is normal.  A  stent was successfully placed.   Preserved to low normal LV function with minimal residual mid to basal inferior hypocontractility and an ejection fraction of 50%.  LVEDP 10 mmHg.  Coronary obstructive disease with a normal LAD, normal ramus intermediate vessel, progressive 80% proximal left circumflex stenosis, and widely patent previously placed distal RCA stent at the site of prior inferior STEMI.  Successful PCI to the 80% left circumflex stenosis with the patient participating in the [place] stent protocol with ultimate insertion of a 3.0 x 13 mm sirolimus DES stent postdilated to 3.31 mm with the 80% stenosis being reduced to 0%.  RECOMMENDATION: Continue DAPT for at least a year.  Medical therapy.  The patient has intolerance to statins.  Consider Zetia.     Patient Profile     77 y.o. male male with coronary artery disease status post inferior ST elevation myocardial infarction in June 2018 treated with a DES to the distal RCAand recent DES to proximal circumflex, hyperlipidemia, sleep apneawho presented to office with significant SOB and admitted for further workup   Assessment & Plan    1. CAD s/p DES to  RCA and now DES to circumflex -He denies any substernal chest pain or pressure. He has progressive worsening dyspnea on exertion.  -EKG without acute ischemic changes and troponin normal x 3 -Dr. Burt Knack felt his SOB likely not related to coronary ischemia - last cath showed patent RCA stent and 80% prox LCx s/p PCI - he never really felt better from a breathing standpoint s/p PCI -Continue aspirin, Plavix and beta-blocker.  2. HLD -02/27/2018: Cholesterol 148; HDL 45; LDL Cholesterol 61; Triglycerides 208; VLDL 42 - Statin intolerance. Declinedlipid lowering medicines such as ezetimibe or fibrates at this point. He will work on modifying his diet and work on lifestyle modification. -consider referral to lipid clinic for [place]  3. OSA -Compliant with  CPAP.  4.Dyspnea on exertion/acute hypoxia  -Dr. Burt Knack felt  his symptoms were not related to angina.  -EKG is nonischemic, trop neg x 3 and BNP normal.  -With ambulation his oxygen saturation was 78% after walking for about 50-100 feet.  -Chest CT angio with no PE.  There was mild peribronchial thickening without infiltrate. -Pulmonary on board and plan for 6 minute walk test, VQ scan to rule out chronic thromboembolism, Echo with bubble study, liver US, PFTs.  There does not appear to be a current cardiac etiology for his SOB so will ask pulmonary to assume care.    For questions or updates, please contact [place] Please consult [email or web address] for contact info under Cardiology/STEMI.      Signed, Fransico Him, MD  03/19/2018, 9:07 AM

## 2018-03-20 ENCOUNTER — Other Ambulatory Visit: Payer: Self-pay | Admitting: Medical

## 2018-03-20 ENCOUNTER — Inpatient Hospital Stay (HOSPITAL_COMMUNITY): Payer: Medicare HMO

## 2018-03-20 DIAGNOSIS — R0602 Shortness of breath: Secondary | ICD-10-CM

## 2018-03-20 DIAGNOSIS — E7800 Pure hypercholesterolemia, unspecified: Secondary | ICD-10-CM

## 2018-03-20 DIAGNOSIS — R0600 Dyspnea, unspecified: Secondary | ICD-10-CM

## 2018-03-20 LAB — PULMONARY FUNCTION TEST
DL/VA % PRED: 85 %
DL/VA: 3.73 ml/min/mmHg/L
DLCO COR: 21.87 ml/min/mmHg
DLCO UNC % PRED: 78 %
DLCO cor % pred: 77 %
DLCO unc: 22.35 ml/min/mmHg
FEF 25-75 PRE: 4.06 L/s
FEF 25-75 Post: 3.69 L/sec
FEF2575-%CHANGE-POST: -8 %
FEF2575-%PRED-POST: 188 %
FEF2575-%PRED-PRE: 206 %
FEV1-%Change-Post: -1 %
FEV1-%PRED-PRE: 139 %
FEV1-%Pred-Post: 137 %
FEV1-POST: 3.71 L
FEV1-Pre: 3.78 L
FEV1FVC-%CHANGE-POST: 0 %
FEV1FVC-%PRED-PRE: 113 %
FEV6-%CHANGE-POST: -2 %
FEV6-%PRED-PRE: 129 %
FEV6-%Pred-Post: 126 %
FEV6-Post: 4.42 L
FEV6-Pre: 4.53 L
FEV6FVC-%Change-Post: 0 %
FEV6FVC-%Pred-Post: 107 %
FEV6FVC-%Pred-Pre: 106 %
FVC-%Change-Post: -1 %
FVC-%PRED-POST: 120 %
FVC-%PRED-PRE: 121 %
FVC-POST: 4.51 L
FVC-PRE: 4.57 L
POST FEV1/FVC RATIO: 82 %
PRE FEV1/FVC RATIO: 83 %
Post FEV6/FVC ratio: 100 %
Pre FEV6/FVC Ratio: 99 %
RV % pred: 81 %
RV: 1.95 L
TLC % pred: 102 %
TLC: 6.58 L

## 2018-03-20 MED ORDER — ALBUTEROL SULFATE (2.5 MG/3ML) 0.083% IN NEBU
2.5000 mg | INHALATION_SOLUTION | Freq: Once | RESPIRATORY_TRACT | Status: AC
  Administered 2018-03-20: 2.5 mg via RESPIRATORY_TRACT

## 2018-03-20 NOTE — Progress Notes (Signed)
Ambulated in hallway on room with pulse ox monitor. SpO2% = 98% with ambulation x 400 feet.

## 2018-03-20 NOTE — Progress Notes (Signed)
Progress Note  Patient Name: Timothy Wilcox Date of Encounter: 03/20/2018  Primary Cardiologist: Sherren Mocha, MD   Subjective   Still has episodic SOB  Inpatient Medications    Scheduled Meds: . allopurinol  300 mg Oral Daily  . aspirin EC  81 mg Oral Daily  . clopidogrel  75 mg Oral Daily  . fluticasone  1 spray Each Nare BID  . heparin  5,000 Units Subcutaneous Q8H  . loratadine  10 mg Oral BID  . metoprolol succinate  12.5 mg Oral Daily  . multivitamin with minerals  1 tablet Oral Daily   Continuous Infusions:  PRN Meds: acetaminophen, nitroGLYCERIN, ondansetron (ZOFRAN) IV   Vital Signs    Vitals:   03/19/18 0800 03/19/18 1507 03/19/18 2102 03/20/18 0646  BP: (!) 124/92 120/74 111/67 120/65  Pulse: 65 74 72 67  Resp:   18 18  Temp:  98.1 F (36.7 C) 98.1 F (36.7 C) 98.5 F (36.9 C)  TempSrc:  Oral Oral Oral  SpO2:  97% 95% 100%  Weight:    214 lb 4.6 oz (97.2 kg)  Height:        Intake/Output Summary (Last 24 hours) at 03/20/2018 0841 Last data filed at 03/20/2018 0248 Gross per 24 hour  Intake 480 ml  Output 1800 ml  Net -1320 ml   Filed Weights   03/18/18 1355 03/19/18 0509 03/20/18 0646  Weight: 215 lb 3.2 oz (97.6 kg) 214 lb 1.6 oz (97.1 kg) 214 lb 4.6 oz (97.2 kg)    Telemetry    NSR - Personally Reviewed  ECG    No new EKG to review - Personally Reviewed  Physical Exam   GEN: No acute distress.   Neck: No JVD Cardiac: RRR, no murmurs, rubs, or gallops.  Respiratory: Clear to auscultation bilaterally. GI: Soft, nontender, non-distended  MS: No edema; No deformity. Neuro:  Nonfocal  Psych: Normal affect   Labs    Chemistry Recent Labs  Lab 03/18/18 1413  NA 139  K 4.2  CL 106  CO2 27  GLUCOSE 114*  BUN 15  CREATININE 0.99  CALCIUM 10.2  GFRNONAA >60  GFRAA >60  ANIONGAP 6     Hematology Recent Labs  Lab 03/18/18 1413  WBC 5.1  RBC 4.81  HGB 15.4  HCT 46.0  MCV 95.6  MCH 32.0  MCHC 33.5  RDW 12.8   PLT 219    Cardiac Enzymes Recent Labs  Lab 03/18/18 1646 03/18/18 2201 03/19/18 0403  TROPONINI <0.03 <0.03 <0.03   No results for input(s): TROPIPOC in the last 168 hours.   BNP Recent Labs  Lab 03/18/18 1413  BNP 20.8     DDimer No results for input(s): DDIMER in the last 168 hours.   Radiology    Dg Chest 2 View  Result Date: 03/19/2018 CLINICAL DATA:  Shortness of breath for 2 weeks, coronary artery disease with cardiac stenting 3 weeks ago, MI 1 year ago EXAM: CHEST - 2 VIEW COMPARISON:  02/26/2018 FINDINGS: Normal heart size, mediastinal contours, and pulmonary vascularity. Atherosclerotic calcifications aorta. Lungs clear. No pleural effusion or pneumothorax. Bones demineralized. IMPRESSION: No acute abnormalities. Electronically Signed   By: Lavonia Dana M.D.   On: 03/19/2018 11:00   Ct Angio Chest Pe W Or Wo Contrast  Result Date: 03/18/2018 CLINICAL DATA:  Increased shortness of breath and dizziness for 3 days, high pretest probability for pulmonary embolism, history hypertension, coronary artery disease post STEMI, irritable bowel syndrome, gout  EXAM: CT ANGIOGRAPHY CHEST WITH CONTRAST TECHNIQUE: Multidetector CT imaging of the chest was performed using the standard protocol during bolus administration of intravenous contrast. Multiplanar CT image reconstructions and MIPs were obtained to evaluate the vascular anatomy. CONTRAST:  181mL ISOVUE-370 IOPAMIDOL (ISOVUE-370) INJECTION 76% IV COMPARISON:  None FINDINGS: Cardiovascular: Scattered atherosclerotic calcifications aorta, proximal great vessels and coronary arteries. Aorta normal caliber without aneurysm. Pulmonary arteries well opacified and patent. No evidence of pulmonary embolism. No pericardial effusion. Mediastinum/Nodes: Small hiatal hernia. Esophagus unremarkable. Base of cervical region normal appearance. No thoracic adenopathy. Tiny calcified mediastinal lymph nodes noted. Lungs/Pleura: Peribronchial  thickening. No acute infiltrate, pleural effusion or pneumothorax. Upper Abdomen: Visualized upper abdomen unremarkable Musculoskeletal: Unremarkable Review of the MIP images confirms the above findings. IMPRESSION: No evidence of pulmonary embolism. Peribronchial thickening without infiltrate. Scattered atherosclerotic disease changes including coronary arteries. Small hiatal hernia. Aortic Atherosclerosis (ICD10-I70.0). Electronically Signed   By: Lavonia Dana M.D.   On: 03/18/2018 16:21   Nm Pulmonary Perf And Vent  Result Date: 03/19/2018 CLINICAL DATA:  Shortness of breath EXAM: NUCLEAR MEDICINE VENTILATION - PERFUSION LUNG SCAN VIEWS: Anterior, posterior, left lateral, right lateral, RPO, LPO, RAO, LAO-ventilation and perfusion RADIOPHARMACEUTICALS:  31.5 mCi of Tc-75m DTPA aerosol inhalation and 4.15 mCi Tc85m-MAA IV COMPARISON:  Chest radiograph March 19, 2018 FINDINGS: Ventilation: Radiotracer uptake on the ventilation study is homogeneous and symmetric bilaterally. No ventilation defects evident. Perfusion: Radiotracer uptake on the perfusion study is homogeneous and symmetric bilaterally. No perfusion defects evident. IMPRESSION: No appreciable ventilation or perfusion defects. Very low probability of pulmonary embolus. Electronically Signed   By: Lowella Grip III M.D.   On: 03/19/2018 11:03   US Abdomen Limited Ruq  Result Date: 03/18/2018 CLINICAL DATA:  Rule out cirrhosis. EXAM: ULTRASOUND ABDOMEN LIMITED RIGHT UPPER QUADRANT COMPARISON:  None. FINDINGS: Gallbladder: No gallstones or wall thickening visualized. No sonographic Murphy sign noted by sonographer. Common bile duct: Diameter: 6.7 mm Liver: Increased echogenicity with no focal mass. Portal vein is patent on color Doppler imaging with normal direction of blood flow towards the liver. IMPRESSION: 1. The common bile duct measures 6.7 mm which is borderline in a patient of this age. Recommend clinical correlation and correlation with  labs. 2. Increased echogenicity in the liver is nonspecific but often seen with hepatic steatosis. No nodular contour to definitively suggest cirrhosis. 3. Nonobstructive 7 mm stone in the right kidney. Electronically Signed   By: Dorise Bullion III M.D   On: 03/18/2018 22:28    Cardiac Studies   2D echo 02/27/2018 Study Conclusions  - Left ventricle: The cavity size was normal. Systolic function was normal. The estimated ejection fraction was in the range of 55% to 60%. Wall motion was normal; there were no regional wall motion abnormalities. There was an increased relative contribution of atrial contraction to ventricular filling. Doppler parameters are consistent with abnormal left ventricular relaxation (grade 1 diastolic dysfunction). Doppler parameters are consistent with high ventricular filling pressure. - Aortic valve: Trileaflet; mildly thickened, moderately calcified leaflets. - Mitral valve: Calcified annulus. - Right ventricle: The cavity size was mildly dilated. Wall thickness was normal. Systolic function was mildly reduced.  Cardiac Cath 02/27/2018 Conclusion     Previously placed Dist RCA stent (unknown type) is widely patent.  Prox Cx lesion is 80% stenosed.  Post intervention, there is a 0% residual stenosis.  The left ventricular systolic function is normal.  A stent was successfully placed.  Preserved to low normal LV function with  minimal residual mid to basal inferior hypocontractility and an ejection fraction of 50%. LVEDP 10 mmHg.  Coronary obstructive disease with a normal LAD, normal ramus intermediate vessel, progressive 80% proximal left circumflex stenosis, and widely patent previously placed distal RCA stent at the site of prior inferior STEMI.  Successful PCI to the 80% left circumflex stenosis with the patient participating in the [place] stent protocol with ultimate insertion of a 3.0 x 13 mm sirolimus DES  stent postdilated to 3.31 mm with the 80% stenosis being reduced to 0%.  RECOMMENDATION: Continue DAPT for at least a year. Medical therapy. The patient has intolerance to statins. Consider Zetia.     Patient Profile     77 y.o. male with coronary artery disease status post inferior ST elevation myocardial infarction in June 2018 treated with a DES to the distal RCAand recent DES to proximal circumflex, hyperlipidemia, sleep apneawho presented to office with significant SOB and admitted for further workup  Assessment & Plan    1. CAD s/p DES to RCA and now DES to circumflex -He denies any substernal chest pain or pressure. He has had progressive worsening dyspnea on exertion.  -EKG without acute ischemic changes and troponin normal x 3 -Dr. Burt Knack felt his SOB likely not related to coronary ischemia - last cath showed patent RCA stent and 80% prox LCx s/p PCI - he never really felt better from a breathing standpoint s/p PCI -Continue aspirin, Plavix and beta-blocker.  2. HLD -02/27/2018: Cholesterol 148; HDL 45; LDL Cholesterol 61; Triglycerides 208; VLDL 42 - Statin intolerance. Declinedlipid lowering medicines such as ezetimibe or fibrates at this point. He will work on modifying his diet and work on lifestyle modification. -will refer to lipid clinic for [place] [facility]i  3. OSA -Compliant with CPAP.  4.Dyspnea on exertion/acute hypoxia  -Dr. Burt Knack felt  his symptoms were not related to angina.  -EKG is nonischemic, trop neg x 3 and BNP normal.  -With ambulation his oxygen saturation was 78% after walking for about 50-100 feet.  -Chest CT angio with no PE.  There was mild peribronchial thickening without infiltrate.  -Appreciate pulmonary evaluation.  2D echocardiogram bubble study yesterday showed no evidence of shunt.  VQ scan showed no evidence of chronic cardioembolic disease.  PFTs are pending.  Right upper quadrant ultrasound showed nonspecific increase in  echogenicity possibly hepatic steatosis.  Pulmonary feels that given bronchial wall thickening he may have asthma with an atypical presentation.  They have recommended outpatient cardiopulmonary exercise test as well as referral to cardiopulmonary rehab and an exercise program. -Await further recs from pulmonary today.  Patient may be able to be discharged home later today.   For questions or updates, please contact [place] Please consult [email or web address] for contact info under Cardiology/STEMI.      Signed, Fransico Him, MD  03/20/2018, 8:41 AM

## 2018-03-20 NOTE — Progress Notes (Signed)
Name: Timothy Wilcox MRN: [id number] DOB: July 11, 1941    ADMISSION DATE:  03/18/2018 CONSULTATION DATE: March 18, 2018  REFERRING MD : Dr. Burt Knack, Cardiology  CHIEF COMPLAINT: Dyspnea  HISTORY OF PRESENT ILLNESS: 77 year old man with past medical history as below, which is significant for coronary artery disease, hyperlipidemia, renal insufficiency, and obstructive sleep apnea.  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 He was readmitted in May 2019 with complaints of persistent dyspnea.  He again underwent left heart catheterization which demonstrated a proximal circumflex lesion which was treated with a drug-eluting stent. This treatment did not offer him symptomatic relief. During outpatient follow up 6/11he complained again of progressive dyspnea. He ambulated a short distance on room air and desaturated into the high 70s. He was directly admitted to [facility] and PCCM has been asked to evaluate.   Pets: Has a dog.  No birds, farm animals Occupation: Retired from administration. Exposures: Does woodworking as a hobby.  Exposed to wood dust.  No other exposures. Smoking history: Never smoker Travel history: No significant travel Relevant family history: No significant family history of lung disease.  SIGNIFICANT EVENTS  6/11 admit for hypoxia  STUDIES:  CTA chest 6/11 > No evidence of PE, peribronchial thickening without infiltrated. Small hiatal hernia.  Echo bubble study 6/12> Negative for shunt V/Q 6/12> negative PFT 6/13 > no obstruction or restriction.  Minimal diffusion defect RUQ [place] 6/11> nonspecific increase in echogenicity.  Possible hepatic steatosis  PAST MEDICAL HISTORY :   has a past medical history of Arthritis, CAD (coronary artery disease) (02/26/2018), Gout, History of acute inferior wall MI (04/03/2017), Hyperlipemia, IBS (irritable bowel syndrome),  Medication intolerance, Renal disorder, Seasonal allergies, Sinus congestion, and Sleep apnea.  has a past surgical history that includes Colonoscopy; Incision and drainage (2011); Toe Debridement; Shoulder arthroscopy with rotator cuff repair and subacromial decompression (Right, 01/29/2013); LEFT HEART CATH AND CORONARY ANGIOGRAPHY (N/A, 04/03/2017); CORONARY STENT INTERVENTION (N/A, 04/03/2017); LEFT HEART CATH AND CORONARY ANGIOGRAPHY (N/A, 02/27/2018); and CORONARY STENT INTERVENTION (N/A, 02/27/2018). Prior to Admission medications   Medication Sig Start Date End Date Taking? Authorizing Provider  allopurinol (ZYLOPRIM) 300 MG tablet Take 300 mg by mouth daily.   Yes [provider]  aspirin EC 81 MG tablet Take 81 mg by mouth daily.   Yes [provider]  clopidogrel (PLAVIX) 75 MG tablet Take 1 tablet (75 mg total) by mouth daily. 04/30/17  Yes Sherren Mocha, MD  fluticasone [facility]) 50 MCG/ACT nasal spray Place 1 spray into both nostrils 2 (two) times daily.   Yes [provider]  ibuprofen (ADVIL,MOTRIN) 200 MG tablet Take 400 mg by mouth every 6 (six) hours as needed for headache or moderate pain.    Yes [provider]  loratadine (CLARITIN) 10 MG tablet Take 10 mg by mouth 2 (two) times daily.    Yes [provider]  metoprolol succinate (TOPROL XL) 25 MG 24 hr tablet Take 0.5 tablets (12.5 mg total) by mouth daily. 05/09/17 05/09/18 Yes Sherren Mocha, MD  Multiple Vitamin (MULTIVITAMIN WITH MINERALS) TABS Take 1 tablet by mouth daily.   Yes [provider]  nitroGLYCERIN (NITROSTAT) 0.4 MG SL tablet Place 1 tablet (0.4 mg total) under the tongue every 5 (five) minutes x 3 doses as needed for chest pain. 04/05/17  Yes Bhagat, Bhavinkumar, PA  Probiotic Product (DAILY PROBIOTIC PO) Take 1 tablet by  mouth daily.   Yes [provider]  sildenafil (VIAGRA) 100 MG tablet Take 100 mg by mouth daily as needed for erectile dysfunction.     Yes [provider]  tamsulosin (FLOMAX) 0.4 MG CAPS Take 0.4 mg by mouth as needed (used if he has a kidney stone).  03/24/12  Yes Hyman Bible, PA-C   Allergies  Allergen Reactions  . Brilinta [Ticagrelor] Shortness Of Breath and Other (See Comments)    Dizziness, weakness/Caused hospital admission  . Statins Other (See Comments)    Pain in joints, cant move knees  . Dicyclomine Other (See Comments)    Interfered with sleep,nervous  . Tagamet [Cimetidine] Other (See Comments)    Gynecomastia     FAMILY HISTORY:  family history is not on file. SOCIAL HISTORY:  reports that he has never smoked. He has never used smokeless tobacco. He reports that he drinks alcohol. He reports that he does not use drugs.  REVIEW OF SYSTEMS:   All negative; except for those that are bolded, which indicate positives.  Constitutional: weight loss, weight gain, night sweats, fevers, chills, fatigue, weakness.  HEENT: headaches, sore throat, sneezing, nasal congestion, post nasal drip, difficulty swallowing, tooth/dental problems, visual complaints, visual changes, ear aches. Neuro: difficulty with speech, weakness, numbness, ataxia. CV:  chest pain, orthopnea, PND, swelling in lower extremities, dizziness, palpitations, syncope.  Resp: cough, hemoptysis, dyspnea, wheezing. GI: heartburn, indigestion, abdominal pain, nausea, vomiting, diarrhea, constipation, change in bowel habits, loss of appetite, hematemesis, melena, hematochezia.  GU: dysuria, change in color of urine, urgency or frequency, flank pain, hematuria. MSK: joint pain or swelling, decreased range of motion. Psych: change in mood or affect, depression, anxiety, suicidal ideations, homicidal ideations. Skin: rash, itching, bruising.    SUBJECTIVE:  No complaints today.  Feels he is doing well with no dyspnea.  VITAL SIGNS: Temp:  [98.1 F (36.7 C)-98.5 F (36.9 C)] 98.5 F (36.9 C) (06/13 0646) Pulse Rate:  [67-74] 67  (06/13 0646) Resp:  [18] 18 (06/13 0646) BP: (111-120)/(65-74) 120/65 (06/13 0646) SpO2:  [95 %-100 %] 100 % (06/13 0646) Weight:  [214 lb 4.6 oz (97.2 kg)] 214 lb 4.6 oz (97.2 kg) (06/13 0646)  PHYSICAL EXAMINATION: Gen:      No acute distress HEENT:  EOMI, sclera anicteric Neck:     No masses; no thyromegaly Lungs:    Clear to auscultation bilaterally; normal respiratory effort CV:         Regular rate and rhythm; no murmurs Abd:      + bowel sounds; soft, non-tender; no palpable masses, no distension Ext:    No edema; adequate peripheral perfusion Skin:      Warm and dry; no rash Neuro: alert and oriented x 3 Psych: normal mood and affect  Recent Labs  Lab 03/18/18 1413  NA 139  K 4.2  CL 106  CO2 27  BUN 15  CREATININE 0.99  GLUCOSE 114*   Recent Labs  Lab 03/18/18 1413  HGB 15.4  HCT 46.0  WBC 5.1  PLT 219   PFTs 03/20/2018 FVC 4.51 [920%), FEV1 3.71 [137%), F/F 82, TLC 102%, DLCO 78%, DLCO/VA 85%  ASSESSMENT / PLAN: 77 year old with coronary artery disease, OSA being evaluated for dyspnea, hypoxia of unclear etiology  I have reviewed the CT scan with Dr. Weber Cooks, radiologist.  It shows several thin-walled surface of unclear etiology with bronchial wall thickening.  There is no clear evidence of interstitial lung disease No PE on CT angiogram or  VQ scan.  No evidence of shunt on echocardiogram or cirrhosis on liver ultrasound PFTs reviewed with no evidence of airway obstruction or restriction.  There is minimal reduction in diffusion capacity that corrects for alveolar volume.  This is likely not significant  - No further recommendations at this point - Consider cardiopulmonary exercise test as an outpatient to further work-up etiology of dyspnea. - Check O2 levels on exertion before discharge.  Marshell Garfinkel MD [facility] 03/20/2018, 2:07 PM

## 2018-03-20 NOTE — Discharge Summary (Signed)
Discharge Summary    Patient ID: Timothy Wilcox,  MRN: [id number], DOB/AGE: 1940-12-23 77 y.o.  Admit date: 03/18/2018 Discharge date: 03/20/2018  Primary Care Provider: Hulan Fess Primary Cardiologist: Sherren Mocha, MD  Discharge Diagnoses    Principal Problem:   Acute respiratory failure with hypoxia [facility]) Active Problems:   Hyperlipidemia   CAD (coronary artery disease)   Shortness of breath   Allergies Allergies  Allergen Reactions  . Brilinta [Ticagrelor] Shortness Of Breath and Other (See Comments)    Dizziness, weakness/Caused hospital admission  . Statins Other (See Comments)    Pain in joints, cant move knees  . Dicyclomine Other (See Comments)    Interfered with sleep,nervous  . Tagamet [Cimetidine] Other (See Comments)    Gynecomastia     Diagnostic Studies/Procedures    None _____________   History of Present Illness     Timothy Wilcox a 76 y.o.malewith coronary artery disease status post inferior ST elevation myocardial infarction in June 2018 treated with a DES to the distal RCAand recent DES to proximal circumflex, hyperlipidemia, sleep apneawho presents for hospital follow up.  He had significant side effects to Ticagrelor prompting transition to Clopidogrel. Has been evaluated by the lipid clinic due to intolerance to statin therapy.  Admitted to hospital from clinic 02/2018 for DOE.He was taken to the cath lab on 02/27/18 and found to have patent RCA stent and 80% stenosed proximal circumflex. He underwent successful PCI to the circwith the patient participating in the Optimize Study stent protocol with ultimate insertion of a 3.0 x 13 mm sirolimus DES.Declinedlipid lowering medicines such as ezetimibe or fibrates at this point. He will work on modifying his diet and work on lifestyle modification.  He presented outpatient for follow up 03/18/18. Patient felt well for 3 to 4 days post PCI. Since then he has progressive  worsening dyspnea on exertion,similar to hospital presentation. He denies any substernal chest pain/pressure. No orthopnea, PND, syncope, dizziness or lower extremity edema.     Hospital Course     Consultants: Pulmonary   1. DOE with hypoxia: presented with progressively worsening DOE. Ambulatory O2 sats decreased to 78% on RA. CT Chest without PE but was noted to have mild peribronchial thickening without infiltrate. VQ scan was negative for PE. RUQ [place] without evidence of cirrhosis but did reveal hepatic steatosis. Thought to not be related to angina given recent LHC with successful stenting and medication compliance. EKG non-ischemic. Trop negative x3. BNP wnl. Pulmonary was consulted and thought CT findings may reflect asthma with an atypical presentation. He underwent PFTs which showed no evidence of airway obstruction or restriction. He was recommended to obtain an outpatient cardiopulmonary exercise test to further evaluate the etiology of his dyspnea. Ambulatory O2 sat prior to discharge was 98% and patient was without SOB complaints.  - Cardiopulmonary exercise test scheduled for 04/09/18 at 10:30am    2. CAD s/p PCI/DES LCx 02/27/18: DOE likely not related to coronary ischemia. No complaints of chest pain. LHC 02/27/18 with widely patent RCA stent and 80% stenosis of the proximal circumflex, managed with PCI/DES. Trop negative x3 and EKG non-ischemic. No additional ischemic evaluation performed this admission. - Continue ASA, plavix, and BBlocker  3. HLD: Cholesterol 148; HDL 45; LDL 61; Triglycerides 208. He is intolerant to statins and declining alternatives such as zetia or fibrates. He has been seen in the lipid clinic in the past and was not interested in alternative medications at that time.  -  Consider referral back to lipid clinic for PSK-9 inhibitors if patient is agreeable  4. OSA: - Continue home CPAP   _____________  Discharge Vitals Blood pressure 120/65, pulse 67,  temperature 98.5 F (36.9 C), temperature source Oral, resp. rate 18, height 5\' 7"  (1.702 m), weight 214 lb 4.6 oz (97.2 kg), SpO2 98 %.  Filed Weights   03/18/18 1355 03/19/18 0509 03/20/18 0646  Weight: 215 lb 3.2 oz (97.6 kg) 214 lb 1.6 oz (97.1 kg) 214 lb 4.6 oz (97.2 kg)    Labs & Radiologic Studies    CBC Recent Labs    03/18/18 1413  WBC 5.1  HGB 15.4  HCT 46.0  MCV 95.6  PLT 366   Basic Metabolic Panel Recent Labs    03/18/18 1413  NA 139  K 4.2  CL 106  CO2 27  GLUCOSE 114*  BUN 15  CREATININE 0.99  CALCIUM 10.2   Liver Function Tests No results for input(s): AST, ALT, ALKPHOS, BILITOT, PROT, ALBUMIN in the last 72 hours. No results for input(s): LIPASE, AMYLASE in the last 72 hours. Cardiac Enzymes Recent Labs    03/18/18 1646 03/18/18 2201 03/19/18 0403  TROPONINI <0.03 <0.03 <0.03   BNP Invalid input(s): POCBNP D-Dimer No results for input(s): DDIMER in the last 72 hours. Hemoglobin A1C No results for input(s): HGBA1C in the last 72 hours. Fasting Lipid Panel No results for input(s): CHOL, HDL, LDLCALC, TRIG, CHOLHDL, LDLDIRECT in the last 72 hours. Thyroid Function Tests No results for input(s): TSH, T4TOTAL, T3FREE, THYROIDAB in the last 72 hours.  Invalid input(s): FREET3 _____________  Dg Chest 2 View  Result Date: 03/19/2018 CLINICAL DATA:  Shortness of breath for 2 weeks, coronary artery disease with cardiac stenting 3 weeks ago, MI 1 year ago EXAM: CHEST - 2 VIEW COMPARISON:  02/26/2018 FINDINGS: Normal heart size, mediastinal contours, and pulmonary vascularity. Atherosclerotic calcifications aorta. Lungs clear. No pleural effusion or pneumothorax. Bones demineralized. IMPRESSION: No acute abnormalities. Electronically Signed   By: Lavonia Dana M.D.   On: 03/19/2018 11:00   Dg Chest 2 View  Result Date: 02/26/2018 CLINICAL DATA:  Shortness of breath and dizziness EXAM: CHEST - 2 VIEW COMPARISON:  None. FINDINGS: There is minimal  scarring in the left base. There is no edema or consolidation. The heart size and pulmonary vascularity are normal. No adenopathy. There is aortic atherosclerosis. There is mild degenerative change in the thoracic spine. IMPRESSION: Aortic atherosclerosis. No edema or consolidation. Minimal scarring left base. Aortic Atherosclerosis (ICD10-I70.0). Electronically Signed   By: Lowella Grip III M.D.   On: 02/26/2018 12:16   Ct Angio Chest Pe W Or Wo Contrast  Result Date: 03/18/2018 CLINICAL DATA:  Increased shortness of breath and dizziness for 3 days, high pretest probability for pulmonary embolism, history hypertension, coronary artery disease post STEMI, irritable bowel syndrome, gout EXAM: CT ANGIOGRAPHY CHEST WITH CONTRAST TECHNIQUE: Multidetector CT imaging of the chest was performed using the standard protocol during bolus administration of intravenous contrast. Multiplanar CT image reconstructions and MIPs were obtained to evaluate the vascular anatomy. CONTRAST:  125mL ISOVUE-370 IOPAMIDOL (ISOVUE-370) INJECTION 76% IV COMPARISON:  None FINDINGS: Cardiovascular: Scattered atherosclerotic calcifications aorta, proximal great vessels and coronary arteries. Aorta normal caliber without aneurysm. Pulmonary arteries well opacified and patent. No evidence of pulmonary embolism. No pericardial effusion. Mediastinum/Nodes: Small hiatal hernia. Esophagus unremarkable. Base of cervical region normal appearance. No thoracic adenopathy. Tiny calcified mediastinal lymph nodes noted. Lungs/Pleura: Peribronchial thickening. No acute infiltrate, pleural effusion  or pneumothorax. Upper Abdomen: Visualized upper abdomen unremarkable Musculoskeletal: Unremarkable Review of the MIP images confirms the above findings. IMPRESSION: No evidence of pulmonary embolism. Peribronchial thickening without infiltrate. Scattered atherosclerotic disease changes including coronary arteries. Small hiatal hernia. Aortic Atherosclerosis  (ICD10-I70.0). Electronically Signed   By: Lavonia Dana M.D.   On: 03/18/2018 16:21   Nm Pulmonary Perf And Vent  Result Date: 03/19/2018 CLINICAL DATA:  Shortness of breath EXAM: NUCLEAR MEDICINE VENTILATION - PERFUSION LUNG SCAN VIEWS: Anterior, posterior, left lateral, right lateral, RPO, LPO, RAO, LAO-ventilation and perfusion RADIOPHARMACEUTICALS:  31.5 mCi of Tc-64m DTPA aerosol inhalation and 4.15 mCi Tc22m-MAA IV COMPARISON:  Chest radiograph March 19, 2018 FINDINGS: Ventilation: Radiotracer uptake on the ventilation study is homogeneous and symmetric bilaterally. No ventilation defects evident. Perfusion: Radiotracer uptake on the perfusion study is homogeneous and symmetric bilaterally. No perfusion defects evident. IMPRESSION: No appreciable ventilation or perfusion defects. Very low probability of pulmonary embolus. Electronically Signed   By: Lowella Grip III M.D.   On: 03/19/2018 11:03   US Abdomen Limited Ruq  Result Date: 03/18/2018 CLINICAL DATA:  Rule out cirrhosis. EXAM: ULTRASOUND ABDOMEN LIMITED RIGHT UPPER QUADRANT COMPARISON:  None. FINDINGS: Gallbladder: No gallstones or wall thickening visualized. No sonographic Murphy sign noted by sonographer. Common bile duct: Diameter: 6.7 mm Liver: Increased echogenicity with no focal mass. Portal vein is patent on color Doppler imaging with normal direction of blood flow towards the liver. IMPRESSION: 1. The common bile duct measures 6.7 mm which is borderline in a patient of this age. Recommend clinical correlation and correlation with labs. 2. Increased echogenicity in the liver is nonspecific but often seen with hepatic steatosis. No nodular contour to definitively suggest cirrhosis. 3. Nonobstructive 7 mm stone in the right kidney. Electronically Signed   By: Dorise Bullion III M.D   On: 03/18/2018 22:28   Disposition   Patient was seen and examined by Dr. Radford Pax who deemed patient as stable for discharge. Follow-up has been  arranged. Discharge medications as listed below.   Follow-up Plans & Appointments    Follow-up Information    [facility], Crista Luria, [place] Follow up on 04/07/2018.   Specialty:  Cardiology Why:  Please arrive 15 minutes early for your 11:30am appointment. Contact information: [address] [place] [address] [phone]        [facility] Follow up on 04/09/2018.   Why:  Please check-in at the main entrance of the hospital at 10:30am for your Cardiopulmonary Exercise Test. Contact information: [address] [place] [address] [phone]         Discharge Instructions    Diet - low sodium heart healthy   Complete by:  As directed    Increase activity slowly   Complete by:  As directed       Discharge Medications   Allergies as of 03/20/2018      Reactions   Brilinta [ticagrelor] Shortness Of Breath, Other (See Comments)   Dizziness, weakness/Caused hospital admission   Statins Other (See Comments)   Pain in joints, cant move knees   Dicyclomine Other (See Comments)   Interfered with sleep,nervous   Tagamet [cimetidine] Other (See Comments)   Gynecomastia       Medication List    STOP taking these medications   ibuprofen 200 MG tablet Commonly known as:  ADVIL,MOTRIN     TAKE these medications   allopurinol 300 MG tablet Commonly known as:  ZYLOPRIM Take 300 mg  by mouth daily.   aspirin EC 81 MG tablet Take 81 mg by mouth daily.   clopidogrel 75 MG tablet Commonly known as:  PLAVIX Take 1 tablet (75 mg total) by mouth daily.   DAILY PROBIOTIC PO Take 1 tablet by mouth daily.   fluticasone 50 MCG/ACT nasal spray Commonly known as:  FLONASE Place 1 spray into both nostrils 2 (two) times daily.   loratadine 10 MG tablet Commonly known as:  CLARITIN Take 10 mg by mouth 2 (two) times daily.   metoprolol succinate 25 MG 24 hr tablet Commonly known as:  TOPROL XL Take 0.5 tablets (12.5 mg total) by  mouth daily.   multivitamin with minerals Tabs tablet Take 1 tablet by mouth daily.   nitroGLYCERIN 0.4 MG SL tablet Commonly known as:  NITROSTAT Place 1 tablet (0.4 mg total) under the tongue every 5 (five) minutes x 3 doses as needed for chest pain.   sildenafil 100 MG tablet Commonly known as:  VIAGRA Take 100 mg by mouth daily as needed for erectile dysfunction.   tamsulosin 0.4 MG Caps capsule Commonly known as:  FLOMAX Take 0.4 mg by mouth as needed (used if he has a kidney stone).         Outstanding Labs/Studies   Cardiopulmonary exercise test scheduled for 04/09/18 at 10:30am  Duration of Discharge Encounter   Greater than 30 minutes including physician time.  Signed, Abigail Butts PA-C 03/20/2018, 3:29 PM

## 2018-03-20 NOTE — Discharge Instructions (Signed)
PLEASE REMEMBER TO BRING ALL OF YOUR MEDICATIONS TO EACH OF YOUR FOLLOW-UP OFFICE VISITS. ° °PLEASE ATTEND ALL SCHEDULED FOLLOW-UP APPOINTMENTS.  ° ° ° °

## 2018-03-24 ENCOUNTER — Telehealth: Payer: Self-pay

## 2018-03-24 ENCOUNTER — Encounter (INDEPENDENT_AMBULATORY_CARE_PROVIDER_SITE_OTHER): Payer: Self-pay

## 2018-03-24 ENCOUNTER — Encounter: Payer: Self-pay | Admitting: Physician Assistant

## 2018-03-24 ENCOUNTER — Telehealth: Payer: Self-pay | Admitting: Physician Assistant

## 2018-03-24 NOTE — Telephone Encounter (Addendum)
This message below is on behalf of Vin Bhagat PA-C that came from a pt advice request sent to him at the same time pt called into the office:                         Yes moved appointment after test while Dr. Burt Knack in clinic. We can check blood sugar but if patient wants glucose tolerance test that need to be done by PCP.     Thanks         ----- Message -----  From: Nuala Alpha, LPN  Sent: 1/89/8421  9:24 AM  To: Leanor Kail, PA, Jeanann Lewandowsky, [place]  Subject: FW: Visit Follow-Up Question                 ----- Message -----  From: Lona Kettle  Sent: 03/24/2018  9:22 AM  To: [address] Triage  Subject: Visit Follow-Up Question               ----- Message from [place], Generic sent at 03/24/2018 9:22 AM EDT -----    I have a stress test scheduled 7-3. I am moving my appointment with you, from 7-1 to sometime after the stress test. Also, could we add blood glucose testing to this stress test?

## 2018-03-24 NOTE — Telephone Encounter (Signed)
Per response from Vin, I will call pt and move appt, next available is July 31st, Cooper not here that day but per [facility] don't push it out any further

## 2018-03-24 NOTE — Telephone Encounter (Addendum)
Patient contacted regarding discharge from [facility] on 03/20/18.  Patient understands to follow up with provider Timothy Lis, PA-c on 04/07/18 at 11:30 at [facility] in [place]. Patient understands discharge instructions? Yes Patient understands medications and regiment? Yes Patient understands to bring all medications to this visit? Yes  He c/o exhaustion and SOB but denies CP, dizziness, diaphoresis or nausea.  The pt is requesting a sooner hospital f/u appt as he states that his symptoms are no better now than before he was put in the hospital and he feels that he should have his Cardiopulmonary exercise test prior to seeing Timothy Bhagat, PA-c not after seeing him.  The pt is advised to call our heart failure clinic at [place]. 3 daily to check to see if there have been any cancellations and that if so they will get him in sooner for his Cardiopulmonary exercise test. Also, he is advised to call us if his s/s worsen or if he has any questions or concerns. He verbalized understanding and thanked me for my assistance.

## 2018-03-24 NOTE — Telephone Encounter (Signed)
Scheduling and myself have both left a message for the pt to call back to help accommodate his scheduling preference, that was okayed per Vin PA-C.  Pt did not answer. When the pt calls back, he is to ask for Lorenda Hatchet in scheduling to assist with follow-up appts, and per Vin, when he "prefers" to be seen, we can assess his Blood sugar, but his glucose tolerance test should be done by his PCP.

## 2018-03-24 NOTE — Telephone Encounter (Signed)
Pt called to move 04-07-18 appt to after the CPX 04-09-18, he will not be here the next week, and next open appt is 7-31, pt would like to know what he should do?

## 2018-03-24 NOTE — Telephone Encounter (Signed)
-----   Message from Rivka Barbara sent at 03/20/2018 11:51 AM EDT ----- Regarding: TOC  TOC appointment Timothy Wilcox 7/1 at 11:30am

## 2018-03-25 ENCOUNTER — Ambulatory Visit: Payer: Medicare HMO

## 2018-04-01 ENCOUNTER — Telehealth: Payer: Self-pay | Admitting: Cardiovascular Disease

## 2018-04-01 ENCOUNTER — Encounter: Payer: Self-pay | Admitting: *Deleted

## 2018-04-01 DIAGNOSIS — Z006 Encounter for examination for normal comparison and control in clinical research program: Secondary | ICD-10-CM

## 2018-04-01 NOTE — Telephone Encounter (Signed)
Timothy Wilcox reports he no longer thinks his medications are benefiting him. He thinks he has been on Plavix for too long and he has "5 or 6 other meds" he wants to talk to talk to Dr. Burt Knack about. He has a CPX study scheduled next week, but does not necessarily think it is needed after all the recent tests he had in the hospital where "everything was fine."  His main complaint is that he is tired. He "cannot wait 4 weeks to be evaluated" (he scheduled an appointment with Vin at the end of July).  He requests to be seen by Dr. Burt Knack to discuss medications, symptoms, and further testing. Scheduled patient with Dr. Burt Knack this Thursday.

## 2018-04-01 NOTE — Progress Notes (Signed)
OPTIMIZE  30 DAY FOLLOW UP  PT NUMBER: [id number] PT INTIALS:   DATE: 04/01/18    Type of Visit: []  Office visit [x]  Telephone visit   Angina Status: [x]  asymptomatic / free of symptoms    []  silent ischemia    []  stable angina    []  unstable angina   If silent ischemia: []  EKG     []  Echo     []  ST-Holter     []  Treadmill stress test     []  Nuclear     []  Other  CCS classification: Choose an item.  [place] Classification: Choose an item.  UPDATE THE MEDICATION LOG WITH ANY CHANGES FROM THE NEXT 3 QUESITONS  Did the subject continue on the appropriate course of aspirin:  [x]  YES      []  NO  Did the subject continue on the appropriate course of antiplatelet therapy:  [x]  YES   [] NO  Did any of the subject's daily cardiovascular or antiplatelet medications change: [] YES    []  NO (If yes update the medication log)  Did any new adverse events occur since the last follow up:    [x]  YES   []  NO    * Patient continues to complain of Shortness of breath and dizziness. He denies Chest pain. He has a CPX test scheduled 04/09/18 and then follow up with cardiology 05/07/18. He sounds concerned about his symptoms and thinks waiting until after testing until maybe they can gets some answerers. He also thinks his symptoms could be some of his medication. I instructed him to continue current medication, but if he is concerned see if [facility] could get an appointment sooner than end of July. Patient states he will consider. I thanked him for his participation in the [place] study. I will continue to follow. Next research study required visit (telephone)is due between 27-Jul-2018 - 25-Sep-2018.

## 2018-04-01 NOTE — Telephone Encounter (Signed)
New message    Pt is calling asking for a call back.  Pt c/o medication issue:  1. Name of Medication: toprol and plavix  2. How are you currently taking this medication (dosage and times per day)?   3. Are you having a reaction (difficulty breathing--STAT)?   4. What is your medication issue? Pt states that he thinks his medication isn't helping his problems he's having. He would like to discuss decreasing or stopping one of them. Please call.

## 2018-04-03 ENCOUNTER — Ambulatory Visit: Payer: Medicare HMO | Admitting: Cardiovascular Disease

## 2018-04-03 ENCOUNTER — Encounter: Payer: Self-pay | Admitting: Cardiovascular Disease

## 2018-04-03 VITALS — BP 120/62 | HR 77 | Ht 67.0 in | Wt 219.0 lb

## 2018-04-03 DIAGNOSIS — R0602 Shortness of breath: Secondary | ICD-10-CM

## 2018-04-03 DIAGNOSIS — I2510 Atherosclerotic heart disease of native coronary artery without angina pectoris: Secondary | ICD-10-CM

## 2018-04-03 NOTE — Patient Instructions (Signed)
Medication Instructions:  1) STOP Metoprolol   Labwork: None  Testing/Procedures: Keep appointment to have CPX.  Follow-Up: Keep appointment with Robbie Lis, PA-C on 7/31.  Any Other Special Instructions Will Be Listed Below (If Applicable).     If you need a refill on your cardiac medications before your next appointment, please call your pharmacy.

## 2018-04-03 NOTE — Progress Notes (Signed)
Cardiology Office Note Date:  04/03/2018   ID:  Neilan, Rizzo 04/27/1941, MRN [id number]  PCP:  Hulan Fess, MD  Cardiologist:  Sherren Mocha, MD    Chief Complaint  Patient presents with  . Shortness of Breath    History of Present Illness: Timothy Wilcox is a 77 y.o. male who presents for follow-up of shortness of breath.  The patient has coronary artery disease.  He presented with an inferior STEMI in 2018 and was treated with a drug-eluting stent in the right coronary artery.  He could not tolerate ticagrelor because of shortness of breath.  He was switched to clopidogrel with a good response.  He then presented in May 2019 with progressive dyspnea.  He underwent repeat cardiac catheterization as this was his presenting symptom before.  This demonstrated patency of his stent site but progressive and severe stenosis of the left circumflex.  He underwent uncomplicated stenting and continued on clopidogrel.  The patient is continued to have marked shortness of breath sometimes at rest and other times with physical activity.  He was readmitted to the hospital about 2 weeks ago because he was markedly short of breath and with ambulation pulse oximetry demonstrated an oxygen saturation of 78%.  He underwent extensive evaluation including a VQ scan, CT PE study, echocardiogram, and lab and EKG testing.  He was seen by pulmonary in the hospital and was noted to have some bronchial wall thickening.  Pulmonary function tests were unremarkable.  PRN follow-up was recommended.  There is no evidence of pulmonary emboli.  The patient returns today for follow-up after hospital discharge.  He is frustrated that nothing has been found as a cause of his shortness of breath.  He continues to have dyspnea with low-level activity.  He denies orthopnea, PND, or chest pain.  He is scheduled for a cardiopulmonary exercise test.  He is eager to stop some of his medications to see if this improves his  breathing since he previously had difficulty tolerating ticagrelor.   Past Medical History:  Diagnosis Date  . Arthritis   . CAD (coronary artery disease) 02/26/2018   A. Inf STEMI 6/18: LHC - pLCx 60, dRCA 100, EF 45-50 >> PCI:  DES to RCA // B. Echo 8/18: EF 60-65, nwm, grade 1 dd, MAC. C. LHD DES to circ, patnet RCA stent  . Gout   . 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 Hyperlipemia    intol to some statins  . IBS (irritable bowel syndrome)   . Medication intolerance    severe SOB due to Brilinta >> changed to Plavix  . Renal disorder    kidney stones  . Seasonal allergies   . Sinus congestion    chronic  . Sleep apnea    uses a c-pap    Past Surgical History:  Procedure Laterality Date  . COLONOSCOPY    . CORONARY STENT INTERVENTION N/A 04/03/2017   Procedure: Coronary Stent Intervention;  Surgeon: Sherren Mocha, MD;  Location: [place] [facility];  Service: Cardiovascular;  Laterality: N/A;  . CORONARY STENT INTERVENTION N/A 02/27/2018   Procedure: CORONARY STENT INTERVENTION;  Surgeon: Troy Sine, MD;  Location: [place] [facility];  Service: Cardiovascular;  Laterality: N/A;  . INCISION AND DRAINAGE  2011   infected finger  . LEFT HEART CATH AND CORONARY ANGIOGRAPHY N/A 04/03/2017   Procedure: Left Heart Cath and Coronary Angiography;  Surgeon: Sherren Mocha, MD;  Location: [place] [facility];  Service: Cardiovascular;  Laterality: N/A;  . LEFT HEART CATH AND CORONARY ANGIOGRAPHY N/A 02/27/2018   Procedure: LEFT HEART CATH AND CORONARY ANGIOGRAPHY;  Surgeon: Troy Sine, MD;  Location: [place] [facility];  Service: Cardiovascular;  Laterality: N/A;  . SHOULDER ARTHROSCOPY WITH ROTATOR CUFF REPAIR AND SUBACROMIAL DECOMPRESSION Right 01/29/2013   Procedure: RIGHT SHOULDER ARTHROSCOPY WITH SUBACROMIAL DECOMPRESSION, THREE TENDON ROTATOR CUFF REPAIR;  Surgeon: Cammie Sickle., MD;  Location: [place];  Service: Orthopedics;  Laterality:  Right;  . TOE DEBRIDEMENT     rt toe cyst    Current Outpatient Medications  Medication Sig Dispense Refill  . allopurinol (ZYLOPRIM) 300 MG tablet Take 300 mg by mouth daily.    Marland Kitchen aspirin EC 81 MG tablet Take 81 mg by mouth daily.    . clopidogrel (PLAVIX) 75 MG tablet Take 1 tablet (75 mg total) by mouth daily. 90 tablet 3  . fluticasone (FLONASE) 50 MCG/ACT nasal spray Place 1 spray into both nostrils 2 (two) times daily.    Marland Kitchen loratadine (CLARITIN) 10 MG tablet Take 10 mg by mouth 2 (two) times daily.     . Multiple Vitamin (MULTIVITAMIN WITH MINERALS) TABS Take 1 tablet by mouth daily.    . nitroGLYCERIN (NITROSTAT) 0.4 MG SL tablet Place 1 tablet (0.4 mg total) under the tongue every 5 (five) minutes x 3 doses as needed for chest pain. 25 tablet 12  . Probiotic Product (DAILY PROBIOTIC PO) Take 1 tablet by mouth daily.    . sildenafil (VIAGRA) 100 MG tablet Take 100 mg by mouth daily as needed for erectile dysfunction.     . tamsulosin (FLOMAX) 0.4 MG CAPS Take 0.4 mg by mouth as needed (used if he has a kidney stone).      No current facility-administered medications for this visit.     Allergies:   Brilinta [ticagrelor]; Statins; Dicyclomine; and Tagamet [cimetidine]   Social History:  The patient  reports that he has never smoked. He has never used smokeless tobacco. He reports that he drinks alcohol. He reports that he does not use drugs.   Family History:  The patient's family history is not on file.    ROS:  Please see the history of present illness.   All other systems are reviewed and negative.   PHYSICAL EXAM: VS:  BP 120/62   Pulse 77   Ht _0  (1.702 m)   Wt 219 lb (99.3 kg)   SpO2 98%   BMI 34.30 kg/m  , BMI Body mass index is 34.3 kg/m. GEN: Well nourished, well developed, in no acute distress  HEENT: normal  Neck: no JVD, no masses. No carotid bruits Cardiac: RRR without murmur or gallop      Respiratory:  clear to auscultation bilaterally, normal work  of breathing GI: soft, nontender, nondistended, + BS MS: no deformity or atrophy  Ext: no pretibial edema, pedal pulses 2+= bilaterally Skin: warm and dry, no rash Neuro:  Strength and sensation are intact Psych: euthymic mood, full affect  EKG:  EKG is not ordered today.  Recent Labs: 02/26/2018: ALT 43; TSH 1.471 03/18/2018: B Natriuretic Peptide 20.8; BUN 15; Creatinine, Ser 0.99; Hemoglobin 15.4; Platelets 219; Potassium 4.2; Sodium 139   Lipid Panel     Component Value Date/Time   CHOL 148 02/27/2018 0220   CHOL 165 07/15/2017 1003   TRIG 208 (H) 02/27/2018 0220   HDL 45 02/27/2018 0220   HDL 63 07/15/2017 1003  CHOLHDL 3.3 02/27/2018 0220   VLDL 42 (H) 02/27/2018 0220   LDLCALC 61 02/27/2018 0220   LDLCALC 55 07/15/2017 1003      Wt Readings from Last 3 Encounters:  04/03/18 219 lb (99.3 kg)  03/20/18 214 lb 4.6 oz (97.2 kg)  03/18/18 217 lb (98.4 kg)     Cardiac Studies Reviewed: 2D Echo 03/19/2018: Study Conclusions  - Left ventricle: The cavity size was normal. Systolic function was   normal. The estimated ejection fraction was in the range of 55%   to 60%. Wall motion was normal; there were no regional wall   motion abnormalities. The study is not technically sufficient to   allow evaluation of LV diastolic function. - Mitral valve: Calcified annulus. Systolic bowing without   prolapse. - Atrial septum: No defect or patent foramen ovale was identified.   Echo contrast study showed no right-to-left atrial level shunt,   at baseline or with provocation. No late shunting is seen.  VQ scan: FINDINGS: Ventilation: Radiotracer uptake on the ventilation study is homogeneous and symmetric bilaterally. No ventilation defects evident.  Perfusion: Radiotracer uptake on the perfusion study is homogeneous and symmetric bilaterally. No perfusion defects evident.  IMPRESSION: No appreciable ventilation or perfusion defects. Very low probability of pulmonary  embolus.  CT angio PE study: IMPRESSION: No evidence of pulmonary embolism.  Peribronchial thickening without infiltrate.  Scattered atherosclerotic disease changes including coronary arteries.  Small hiatal hernia.  ASSESSMENT AND PLAN: 1.  Coronary artery disease, native vessel, without angina: I do not think his symptoms are related to myocardial ischemia.  He should continue on aspirin and clopidogrel for at least 6 months from the time of his most recent PCI.  I advised he could stop metoprolol succinate to see if he feels better off of this medication.  He has normal LV function and no evidence of heart failure.  2.  Shortness of breath: Still under evaluation.  Really no causes been determined despite extensive testing.  He is scheduled for a cardiopulmonary exercise test in the near future.  3.  Hyperlipidemia: He is resistant to taking any medication for this.  He is been intolerant of statin drugs because of myalgias.  Current medicines are reviewed with the patient today.  The patient does not have concerns regarding medicines.  Labs/ tests ordered today include:  No orders of the defined types were placed in this encounter.   Disposition:   FU as planned  Signed, Sherren Mocha, MD  04/03/2018 5:28 PM    [address], [place], [place]  [address] Phone: [phone]; Fax: [phone]

## 2018-04-07 ENCOUNTER — Ambulatory Visit: Payer: Medicare HMO | Admitting: Physician Assistant

## 2018-04-09 ENCOUNTER — Ambulatory Visit (HOSPITAL_COMMUNITY): Payer: Medicare HMO | Attending: Cardiovascular Disease

## 2018-04-09 ENCOUNTER — Other Ambulatory Visit (HOSPITAL_COMMUNITY): Payer: Self-pay | Admitting: *Deleted

## 2018-04-09 DIAGNOSIS — R0602 Shortness of breath: Secondary | ICD-10-CM

## 2018-04-15 ENCOUNTER — Encounter: Payer: Self-pay | Admitting: Cardiovascular Disease

## 2018-04-16 DIAGNOSIS — E669 Obesity, unspecified: Secondary | ICD-10-CM | POA: Diagnosis not present

## 2018-04-16 DIAGNOSIS — Z6839 Body mass index (BMI) 39.0-39.9, adult: Secondary | ICD-10-CM | POA: Diagnosis not present

## 2018-04-16 DIAGNOSIS — R0600 Dyspnea, unspecified: Secondary | ICD-10-CM | POA: Diagnosis not present

## 2018-04-25 ENCOUNTER — Other Ambulatory Visit: Payer: Self-pay | Admitting: Cardiovascular Disease

## 2018-05-07 ENCOUNTER — Encounter: Payer: Self-pay | Admitting: Physician Assistant

## 2018-05-07 ENCOUNTER — Encounter

## 2018-05-07 ENCOUNTER — Ambulatory Visit: Payer: Medicare HMO | Admitting: Physician Assistant

## 2018-05-07 VITALS — BP 122/70 | HR 85 | Ht 67.0 in | Wt 218.0 lb

## 2018-05-07 DIAGNOSIS — E785 Hyperlipidemia, unspecified: Secondary | ICD-10-CM | POA: Diagnosis not present

## 2018-05-07 DIAGNOSIS — I2510 Atherosclerotic heart disease of native coronary artery without angina pectoris: Secondary | ICD-10-CM | POA: Diagnosis not present

## 2018-05-07 DIAGNOSIS — Z77120 Contact with and (suspected) exposure to mold (toxic): Secondary | ICD-10-CM

## 2018-05-07 DIAGNOSIS — R0602 Shortness of breath: Secondary | ICD-10-CM

## 2018-05-07 NOTE — Progress Notes (Signed)
Cardiology Office Note    Date:  05/07/2018   ID:  Garcia, Dalzell Jan 07, 1941, MRN [id number]  PCP:  Hulan Fess, MD  Cardiologist:  Dr. Burt Knack   Chief Complaint: Hospital follow up   History of Present Illness:   Timothy Wilcox is a 77 y.o. male with coronary artery disease status post inferior ST elevation myocardial infarction in June 2018 treated with a DES to the distal RCAand recent DES to proximal circumflex 02/2018, hyperlipidemia, sleep apneawho presents for hospital follow up.  He presented with an inferior STEMI in 2018 and was treated with a drug-eluting stent in the right coronary artery.  He could not tolerate ticagrelor because of shortness of breath.  He was switched to clopidogrel with a good response.  He then presented in May 2019 with progressive dyspnea.  He underwent repeat cardiac catheterization as this was his presenting symptom before.  This demonstrated patency of his stent site but progressive and severe stenosis of the left circumflex.  He underwent uncomplicated stenting and continued on clopidogrel.  Again admitted 03/2018 for DOE with hypoxia.  He underwent extensive evaluation including a VQ scan, CT PE study, echocardiogram, and lab and EKG testing.  He was seen by pulmonary in the hospital and was noted to have some bronchial wall thickening. Pulmonary function tests were unremarkable.  PRN follow-up was recommended.  There is no evidence of pulmonary emboli  Seen by Dr. Burt Knack 04/03/18 for hospital follow up. Continued to have DOE. Stopped Toprol XL. Follow up cardiopulmonary test with normal finding. Recommended weight loss and daily exercise.   Here today for follow up.  No improvement in energy level despite continuation of beta-blocker.  Continues to feel fatigued.  Limited exercise.  Denies chest pain, palpitation, dizziness, headache, orthopnea, PND, syncope or melena.  Stable dyspnea on exertion.  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   Past Medical History:  Diagnosis Date  . Arthritis   . CAD (coronary artery disease) 02/26/2018   A. Inf STEMI 6/18: LHC - pLCx 60, dRCA 100, EF 45-50 >> PCI:  DES to RCA // B. Echo 8/18: EF 60-65, nwm, grade 1 dd, MAC. C. LHD DES to circ, patnet RCA stent  . Gout   . 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 Hyperlipemia    intol to some statins  . IBS (irritable bowel syndrome)   . Medication intolerance    severe SOB due to Brilinta >> changed to Plavix  . Renal disorder    kidney stones  . Seasonal allergies   . Sinus congestion    chronic  . Sleep apnea    uses a c-pap    Past Surgical History:  Procedure Laterality Date  . COLONOSCOPY    . CORONARY STENT INTERVENTION N/A 04/03/2017   Procedure: Coronary Stent Intervention;  Surgeon: Sherren Mocha, MD;  Location: [place] [facility];  Service: Cardiovascular;  Laterality: N/A;  . CORONARY STENT INTERVENTION N/A 02/27/2018   Procedure: CORONARY STENT INTERVENTION;  Surgeon: Troy Sine, MD;  Location: [place] [facility];  Service: Cardiovascular;  Laterality: N/A;  . INCISION AND DRAINAGE  2011   infected finger  . LEFT HEART CATH AND CORONARY ANGIOGRAPHY N/A 04/03/2017   Procedure: Left Heart Cath and Coronary Angiography;  Surgeon: Sherren Mocha, MD;  Location: [place] [facility];  Service: Cardiovascular;  Laterality:  N/A;  . LEFT HEART CATH AND CORONARY ANGIOGRAPHY N/A 02/27/2018   Procedure: LEFT HEART CATH AND CORONARY ANGIOGRAPHY;  Surgeon: Troy Sine, MD;  Location: [place] [facility];  Service: Cardiovascular;  Laterality: N/A;  . SHOULDER ARTHROSCOPY WITH ROTATOR CUFF REPAIR AND SUBACROMIAL DECOMPRESSION Right 01/29/2013   Procedure: RIGHT SHOULDER ARTHROSCOPY WITH SUBACROMIAL DECOMPRESSION, THREE TENDON ROTATOR CUFF REPAIR;  Surgeon: Cammie Sickle., MD;  Location: [place];  Service:  Orthopedics;  Laterality: Right;  . TOE DEBRIDEMENT     rt toe cyst    Current Medications: Prior to Admission medications   Medication Sig Start Date End Date Taking? Authorizing Provider  allopurinol (ZYLOPRIM) 300 MG tablet Take 300 mg by mouth daily.    [provider]  aspirin EC 81 MG tablet Take 81 mg by mouth daily.    [provider]  clopidogrel (PLAVIX) 75 MG tablet TAKE ONE TABLET BY MOUTH DAILY 04/25/18   Sherren Mocha, MD  fluticasone [facility]) 50 MCG/ACT nasal spray Place 1 spray into both nostrils 2 (two) times daily.    [provider]  loratadine (CLARITIN) 10 MG tablet Take 10 mg by mouth 2 (two) times daily.     [provider]  Multiple Vitamin (MULTIVITAMIN WITH MINERALS) TABS Take 1 tablet by mouth daily.    [provider]  nitroGLYCERIN (NITROSTAT) 0.4 MG SL tablet Place 1 tablet (0.4 mg total) under the tongue every 5 (five) minutes x 3 doses as needed for chest pain. 04/05/17   Leanor Kail, PA  Probiotic Product (DAILY PROBIOTIC PO) Take 1 tablet by mouth daily.    [provider]  sildenafil (VIAGRA) 100 MG tablet Take 100 mg by mouth daily as needed for erectile dysfunction.     [provider]  tamsulosin (FLOMAX) 0.4 MG CAPS Take 0.4 mg by mouth as needed (used if he has a kidney stone).  03/24/12   Hyman Bible, PA-C    Allergies:   Brilinta [ticagrelor]; Statins; Dicyclomine; and Tagamet [cimetidine]   Social History   Socioeconomic History  . Marital status: Married    Spouse name: Not on file  . Number of children: Not on file  . Years of education: Not on file  . Highest education level: Not on file  Occupational History  . Not on file  Social Needs  . Financial resource strain: Not on file  . Food insecurity:    Worry: Not on file    Inability: Not on file  . Transportation needs:    Medical: Not on file    Non-medical: Not on file  Tobacco Use  . Smoking status:  Never Smoker  . Smokeless tobacco: Never Used  Substance and Sexual Activity  . Alcohol use: Yes    Comment: a bottle of win every night  . Drug use: No  . Sexual activity: Not on file  Lifestyle  . Physical activity:    Days per week: Not on file    Minutes per session: Not on file  . Stress: Not on file  Relationships  . Social connections:    Talks on phone: Not on file    Gets together: Not on file    Attends religious service: Not on file    Active member of club or organization: Not on file    Attends meetings of clubs or organizations: Not on file    Relationship status: Not on file  Other Topics Concern  . Not on  file  Social History Narrative  . Not on file     Family History:  The patient's family history is not on file.   ROS:   Please see the history of present illness.    ROS All other systems reviewed and are negative.   PHYSICAL EXAM:   VS:  BP 122/70   Pulse 85   Ht _0  (1.702 m)   Wt 218 lb (98.9 kg)   SpO2 98%   BMI 34.14 kg/m    GEN: Well nourished, well developed, in no acute distress  HEENT: normal  Neck: no JVD, carotid bruits, or masses Cardiac: RRR; no murmurs, rubs, or gallops,no edema  Respiratory:  clear to auscultation bilaterally, normal work of breathing GI: soft, nontender, nondistended, + BS MS: no deformity or atrophy  Skin: warm and dry, no rash Neuro:  Alert and Oriented x 3, Strength and sensation are intact Psych: euthymic mood, full affect  Wt Readings from Last 3 Encounters:  05/07/18 218 lb (98.9 kg)  04/03/18 219 lb (99.3 kg)  03/20/18 214 lb 4.6 oz (97.2 kg)      Studies/Labs Reviewed:   EKG:  EKG is not ordered today.    Recent Labs: 02/26/2018: ALT 43; TSH 1.471 03/18/2018: B Natriuretic Peptide 20.8; BUN 15; Creatinine, Ser 0.99; Hemoglobin 15.4; Platelets 219; Potassium 4.2; Sodium 139   Lipid Panel    Component Value Date/Time   CHOL 148 02/27/2018 0220   CHOL 165 07/15/2017 1003   TRIG 208 (H)  02/27/2018 0220   HDL 45 02/27/2018 0220   HDL 63 07/15/2017 1003   CHOLHDL 3.3 02/27/2018 0220   VLDL 42 (H) 02/27/2018 0220   LDLCALC 61 02/27/2018 0220   LDLCALC 55 07/15/2017 1003    Additional studies/ records that were reviewed today include:   Echo with bubble study 03/19/18 Study Conclusions  - Left ventricle: The cavity size was normal. Systolic function was   normal. The estimated ejection fraction was in the range of 55%   to 60%. Wall motion was normal; there were no regional wall   motion abnormalities. The study is not technically sufficient to   allow evaluation of LV diastolic function. - Mitral valve: Calcified annulus. Systolic bowing without   prolapse. - Atrial septum: No defect or patent foramen ovale was identified.   Echo contrast study showed no right-to-left atrial level shunt,   at baseline or with provocation. No late shunting is seen.  CORONARY STENT INTERVENTION  02/27/18  LEFT HEART CATH AND CORONARY ANGIOGRAPHY  Conclusion     Previously placed Dist RCA stent (unknown type) is widely patent.  Prox Cx lesion is 80% stenosed.  Post intervention, there is a 0% residual stenosis.  The left ventricular systolic function is normal.  A stent was successfully placed.   Preserved to low normal LV function with minimal residual mid to basal inferior hypocontractility and an ejection fraction of 50%.  LVEDP 10 mmHg.  Coronary obstructive disease with a normal LAD, normal ramus intermediate vessel, progressive 80% proximal left circumflex stenosis, and widely patent previously placed distal RCA stent at the site of prior inferior STEMI.  Successful PCI to the 80% left circumflex stenosis with the patient participating in the [place] stent protocol with ultimate insertion of a 3.0 x 13 mm sirolimus DES stent postdilated to 3.31 mm with the 80% stenosis being reduced to 0%.  RECOMMENDATION: Continue DAPT for at least a year.  Medical therapy.   The patient has  intolerance to statins.  Consider Zetia.      ASSESSMENT & PLAN:    1. Shortness of breath Reassuring cardiac work-up as summarized above.  Cardiopulmonary exercise stress reassuring.  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  Discussed referral to pulmonology however declined.  He does not want to follow-up with his primary PCP.  Wants Dr. Burt Knack for referral advised.  2.  CAD No anginal symptoms.  Continue aspirin, Plavix.  3.hyperlipidemia - 02/27/2018: Cholesterol 148; HDL 45; LDL Cholesterol 61; Triglycerides 208; VLDL 42  -Not taking any statin due to intolerance.    Medication Adjustments/Labs and Tests Ordered: Current medicines are reviewed at length with the patient today.  Concerns regarding medicines are outlined above.  Medication changes, Labs and Tests ordered today are listed in the Patient Instructions below. Patient Instructions  Medication Instructions:   Your physician recommends that you continue on your current medications as directed. Please refer to the Current Medication list given to you today.   If you need a refill on your cardiac medications before your next appointment, please call your pharmacy.  Labwork: NONE ORDERED  TODAY    Testing/Procedures: NONE ORDERED  TODAY    Follow-Up:  Your physician wants you to follow-up in:  IN  6  MONTHS WITH DR Burt Knack You will receive a reminder letter in the mail two months in advance. If you don't receive a letter, please call our office to schedule the follow-up appointment.      Any Other Special Instructions Will Be Listed Below (If Applicable).                                                                                                                                                      Timothy Wilcox, [place]  05/07/2018 4:07 PM    [facility] [address] [place], [place], [place]  [address] Phone: [phone];  Fax: [phone]

## 2018-05-07 NOTE — Patient Instructions (Signed)
Medication Instructions:   Your physician recommends that you continue on your current medications as directed. Please refer to the Current Medication list given to you today.   If you need a refill on your cardiac medications before your next appointment, please call your pharmacy.  Labwork: NONE ORDERED  TODAY    Testing/Procedures: NONE ORDERED  TODAY    Follow-Up:  Your physician wants you to follow-up in:  IN  6  MONTHS WITH DR Burt Knack You will receive a reminder letter in the mail two months in advance. If you don't receive a letter, please call our office to schedule the follow-up appointment.      Any Other Special Instructions Will Be Listed Below (If Applicable).

## 2018-05-09 ENCOUNTER — Telehealth: Payer: Self-pay | Admitting: *Deleted

## 2018-05-09 NOTE — Telephone Encounter (Signed)
Called pt to let him know that per Robbie Lis, PA-C, that Dr. Burt Knack recommended pt follow up with a Pulmonologist if he didn't want to f/u with his PCP. Pt verbalized understanding.

## 2018-05-09 NOTE — Telephone Encounter (Signed)
-----   Message from [place], [place] sent at 05/08/2018  4:28 PM EDT ----- Please let patient know >> Dr. Burt Knack recommends to follow up with pulmonologist. If he does not wish to follow up with his regular PCP, he needs to call by him self to other practice.    ----- Message ----- From: Sherren Mocha, MD Sent: 05/08/2018   3:06 PM To: Leanor Kail, PA  I'm not sure I understand the question. If he doesn't want to see a pulmonologist I don't really have any other recommendations. thx ----- Message ----- From: Leanor Kail, PA Sent: 05/07/2018   4:11 PM To: Sherren Mocha, MD  Recently find out exposure to unknown mold Declined referral to pulmonologist and he does not want to follow-up with his PCP. Wants your opinion for referral/follow-up.

## 2018-05-28 DIAGNOSIS — E782 Mixed hyperlipidemia: Secondary | ICD-10-CM | POA: Diagnosis not present

## 2018-05-28 DIAGNOSIS — Z8739 Personal history of other diseases of the musculoskeletal system and connective tissue: Secondary | ICD-10-CM | POA: Diagnosis not present

## 2018-05-28 DIAGNOSIS — Z1329 Encounter for screening for other suspected endocrine disorder: Secondary | ICD-10-CM | POA: Diagnosis not present

## 2018-05-28 DIAGNOSIS — Z79899 Other long term (current) drug therapy: Secondary | ICD-10-CM | POA: Diagnosis not present

## 2018-05-28 DIAGNOSIS — G4733 Obstructive sleep apnea (adult) (pediatric): Secondary | ICD-10-CM | POA: Diagnosis not present

## 2018-05-29 DIAGNOSIS — E782 Mixed hyperlipidemia: Secondary | ICD-10-CM | POA: Diagnosis not present

## 2018-05-29 DIAGNOSIS — R7301 Impaired fasting glucose: Secondary | ICD-10-CM | POA: Diagnosis not present

## 2018-05-29 DIAGNOSIS — I2510 Atherosclerotic heart disease of native coronary artery without angina pectoris: Secondary | ICD-10-CM | POA: Diagnosis not present

## 2018-05-29 DIAGNOSIS — Z79899 Other long term (current) drug therapy: Secondary | ICD-10-CM | POA: Diagnosis not present

## 2018-05-29 DIAGNOSIS — Z0000 Encounter for general adult medical examination without abnormal findings: Secondary | ICD-10-CM | POA: Diagnosis not present

## 2018-05-29 DIAGNOSIS — Z1389 Encounter for screening for other disorder: Secondary | ICD-10-CM | POA: Diagnosis not present

## 2018-05-29 DIAGNOSIS — G4733 Obstructive sleep apnea (adult) (pediatric): Secondary | ICD-10-CM | POA: Diagnosis not present

## 2018-05-29 DIAGNOSIS — K760 Fatty (change of) liver, not elsewhere classified: Secondary | ICD-10-CM | POA: Diagnosis not present

## 2018-05-29 DIAGNOSIS — Z87442 Personal history of urinary calculi: Secondary | ICD-10-CM | POA: Diagnosis not present

## 2018-05-29 DIAGNOSIS — Z86010 Personal history of colonic polyps: Secondary | ICD-10-CM | POA: Diagnosis not present

## 2018-05-29 DIAGNOSIS — N401 Enlarged prostate with lower urinary tract symptoms: Secondary | ICD-10-CM | POA: Diagnosis not present

## 2018-05-29 DIAGNOSIS — Z8739 Personal history of other diseases of the musculoskeletal system and connective tissue: Secondary | ICD-10-CM | POA: Diagnosis not present

## 2018-06-19 ENCOUNTER — Ambulatory Visit: Payer: Medicare HMO | Admitting: Pulmonary Disease

## 2018-06-19 ENCOUNTER — Encounter: Payer: Self-pay | Admitting: Pulmonary Disease

## 2018-06-19 VITALS — BP 124/70 | HR 80 | Ht 67.0 in | Wt 221.0 lb

## 2018-06-19 DIAGNOSIS — R0600 Dyspnea, unspecified: Secondary | ICD-10-CM | POA: Diagnosis not present

## 2018-06-19 NOTE — Patient Instructions (Signed)
Continue to work on exercise program, weight loss Monitor your symptoms Please give [place] a call if there is any worsening then we can reevaluate  Follow-up as needed.

## 2018-06-19 NOTE — Progress Notes (Signed)
Timothy Wilcox    [id number]    17-Jan-1941  Primary Care Physician:Little, Lennette Bihari, MD  Referring Physician: Hulan Fess, MD [place], [place] [id number]  Chief complaint:   Follow-up for dyspnea.  HPI: 77 year old man with past medical history as below, which is significant for coronary artery disease, hyperlipidemia, renal insufficiency, and obstructive sleep apnea.  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 He was readmitted in May 2019 with complaints of persistent dyspnea.  He again underwent left heart catheterization which demonstrated a proximal circumflex lesion which was treated with a drug-eluting stent. This treatment did not offer him symptomatic relief.  PCCM consulted during this admission for persistent hypoxia He had further work-up including CTA, VQ scan, bubble study with no clear etiology of hypoxia He gradually improved and was discharged  Post discharge he had a cardiopulmonary exercise test which showed normal exercise capacity, increased to space ventilation, limitation secondary to obesity and body habitus. He continues to have very slow improvement in dyspnea and is frustrated by this.  Pets: Has a dog.  No birds, farm animals Occupation: Retired from administration. Exposures: Does woodworking as a hobby. He makes sure to wear a mask and minimizes exposure to wood dust.  Reports isolated exposure to mold in May 2019 Smoking history: Never smoker Travel history: No significant travel Relevant family history: No significant family history of lung disease.  Interim History:  Physical Exam: Blood pressure 124/70, pulse 80, height 5' 7"  (1.702 m), weight 221 lb (100.2 kg), SpO2 97 %. Gen:      No acute distress HEENT:  EOMI, sclera anicteric Neck:     No masses; no thyromegaly Lungs:    Clear to auscultation bilaterally; normal  respiratory effort CV:         Regular rate and rhythm; no murmurs Abd:      + bowel sounds; soft, non-tender; no palpable masses, no distension Ext:    No edema; adequate peripheral perfusion Skin:      Warm and dry; no rash Neuro: alert and oriented x 3 Psych: normal mood and affect  Data Reviewed: Imaging: CTA 03/18/2018-no evidence of PE, peribronchial thickening without infiltrate, small hiatal hernia VQ scan 6/12-negative I have reviewed the images personally.  PFTs: 03/20/18 FVC 4.51 [120%), FEV1 3.71 [137%), F/F 82, TLC 102%, RV/TLC 140%, DLCO 78% Minimal diffusion defect.  Cardiac Echo 03/19/2018- EF 55-60%, no shunt by bubble study.  CPET  04/09/18 Normal functional capacity. At peak exercise patient limited by his ventilation. Suspect majority of his exercise intolerance due to obesity and related ventilatory limitation. However mildly elevated VE/VCO2 slope may indicate a component of diastolic dysfunction. Suggest weight loss and exercise program with retesting if symptoms persist/worsen.    Labs: CBC 02/26/1989-WBC 5.8, hemoglobin 16.1, eos 3%, absolute eosinophil count 174  Assessment:  77 year old with coronary artery disease, OSA evaluated for dyspnea of unclear etiology  I have reviewed the CT scan with Dr. Weber Cooks, radiology.  It shows several thin-walled cysts of unclear etiology with bronchial wall thickening.  There is no clear evidence of interstitial lung disease He has a thorough work-up and ruled out for PE, thromboembolism, shunt PFTs reviewed with no evidence of airway obstruction or restriction.  There is minimal reduction in diffusion capacity that corrects for alveolar volume.  This is not significant Reports mold exposure earlier this  year but there is no evidence of hypersensitivity pneumonitis.  CPET suggests deconditioning, effect of body habitus, obesity. Encouraged him to continue on his exercise program We could do a high-resolution CT to make  sure were not missing any subtle interstitial changes however he would like to defer it for now as he feels has enough testing so far. He will follow-up with [place] as needed as per his wishes.  If there is any worsening then we can always reevaluate with a high res CT scan, PFTs.  Plan/Recommendations: - Continue graded exercise program, weight loss with diet and exercise  Follow-up as needed.  Outpatient Encounter Medications as of 06/19/2018  Medication Sig  . allopurinol (ZYLOPRIM) 300 MG tablet Take 300 mg by mouth daily.  Marland Kitchen aspirin EC 81 MG tablet Take 81 mg by mouth daily.  . clopidogrel (PLAVIX) 75 MG tablet TAKE ONE TABLET BY MOUTH DAILY  . fluticasone (FLONASE) 50 MCG/ACT nasal spray Place 1 spray into both nostrils 2 (two) times daily.  Marland Kitchen loratadine (CLARITIN) 10 MG tablet Take 10 mg by mouth 2 (two) times daily.   . Multiple Vitamin (MULTIVITAMIN WITH MINERALS) TABS Take 1 tablet by mouth daily.  . nitroGLYCERIN (NITROSTAT) 0.4 MG SL tablet Place 1 tablet (0.4 mg total) under the tongue every 5 (five) minutes x 3 doses as needed for chest pain.  . Probiotic Product (DAILY PROBIOTIC PO) Take 1 tablet by mouth daily.  . sildenafil (VIAGRA) 100 MG tablet Take 100 mg by mouth daily as needed for erectile dysfunction.   . tamsulosin (FLOMAX) 0.4 MG CAPS Take 0.4 mg by mouth as needed (used if he has a kidney stone).    No facility-administered encounter medications on file as of 06/19/2018.     Allergies as of 06/19/2018 - Review Complete 06/19/2018  Allergen Reaction Noted  . Brilinta [ticagrelor] Shortness Of Breath and Other (See Comments) 05/17/2017  . Statins Other (See Comments)   . Dicyclomine Other (See Comments) 04/30/2017  . Tagamet [cimetidine] Other (See Comments) 04/03/2017    Past Medical History:  Diagnosis Date  . Arthritis   . CAD (coronary artery disease) 02/26/2018   A. Inf STEMI 6/18: LHC - pLCx 60, dRCA 100, EF 45-50 >> PCI:  DES to RCA // B. Echo 8/18: EF  60-65, nwm, grade 1 dd, MAC. C. LHD DES to circ, patnet RCA stent  . Gout   . 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 Hyperlipemia    intol to some statins  . IBS (irritable bowel syndrome)   . Medication intolerance    severe SOB due to Brilinta >> changed to Plavix  . Renal disorder    kidney stones  . Seasonal allergies   . Sinus congestion    chronic  . Sleep apnea    uses a c-pap    Past Surgical History:  Procedure Laterality Date  . COLONOSCOPY    . CORONARY STENT INTERVENTION N/A 04/03/2017   Procedure: Coronary Stent Intervention;  Surgeon: Sherren Mocha, MD;  Location: [facility];  Service: Cardiovascular;  Laterality: N/A;  . CORONARY STENT INTERVENTION N/A 02/27/2018   Procedure: CORONARY STENT INTERVENTION;  Surgeon: Troy Sine, MD;  Location: [facility];  Service: Cardiovascular;  Laterality: N/A;  . INCISION AND DRAINAGE  2011   infected finger  . LEFT HEART CATH AND CORONARY ANGIOGRAPHY N/A 04/03/2017   Procedure: Left Heart Cath and Coronary Angiography;  Surgeon: Sherren Mocha, MD;  Location: [facility]  INVASIVE [facility];  Service: Cardiovascular;  Laterality: N/A;  . LEFT HEART CATH AND CORONARY ANGIOGRAPHY N/A 02/27/2018   Procedure: LEFT HEART CATH AND CORONARY ANGIOGRAPHY;  Surgeon: Troy Sine, MD;  Location: [facility];  Service: Cardiovascular;  Laterality: N/A;  . SHOULDER ARTHROSCOPY WITH ROTATOR CUFF REPAIR AND SUBACROMIAL DECOMPRESSION Right 01/29/2013   Procedure: RIGHT SHOULDER ARTHROSCOPY WITH SUBACROMIAL DECOMPRESSION, THREE TENDON ROTATOR CUFF REPAIR;  Surgeon: Cammie Sickle., MD;  Location: [facility];  Service: Orthopedics;  Laterality: Right;  . TOE DEBRIDEMENT     rt toe cyst    Family History  Problem Relation Age of Onset  . Skin cancer Mother   . Pancreatic cancer Mother   . Skin cancer Father     Social History   Socioeconomic History  . Marital status: Married    Spouse name:  Not on file  . Number of children: Not on file  . Years of education: Not on file  . Highest education level: Not on file  Occupational History  . Not on file  Social Needs  . Financial resource strain: Not on file  . Food insecurity:    Worry: Not on file    Inability: Not on file  . Transportation needs:    Medical: Not on file    Non-medical: Not on file  Tobacco Use  . Smoking status: Never Smoker  . Smokeless tobacco: Never Used  Substance and Sexual Activity  . Alcohol use: Yes    Comment: a bottle of wine every night  . Drug use: No  . Sexual activity: Not on file  Lifestyle  . Physical activity:    Days per week: Not on file    Minutes per session: Not on file  . Stress: Not on file  Relationships  . Social connections:    Talks on phone: Not on file    Gets together: Not on file    Attends religious service: Not on file    Active member of club or organization: Not on file    Attends meetings of clubs or organizations: Not on file    Relationship status: Not on file  . Intimate partner violence:    Fear of current or ex partner: Not on file    Emotionally abused: Not on file    Physically abused: Not on file    Forced sexual activity: Not on file  Other Topics Concern  . Not on file  Social History Narrative  . Not on file   Review of systems: Review of Systems  Constitutional: Negative for fever and chills.  HENT: Negative.   Eyes: Negative for blurred vision.  Respiratory: as per HPI  Cardiovascular: Negative for chest pain and palpitations.  Gastrointestinal: Negative for vomiting, diarrhea, blood per rectum. Genitourinary: Negative for dysuria, urgency, frequency and hematuria.  Musculoskeletal: Negative for myalgias, back pain and joint pain.  Skin: Negative for itching and rash.  Neurological: Negative for dizziness, tremors, focal weakness, seizures and loss of consciousness.  Endo/Heme/Allergies: Negative for environmental allergies.    Psychiatric/Behavioral: Negative for depression, suicidal ideas and hallucinations.  All other systems reviewed and are negative.  Marshell Garfinkel MD [facility] 06/19/2018, 12:01 PM  CC: Hulan Fess, MD

## 2018-06-20 DIAGNOSIS — N200 Calculus of kidney: Secondary | ICD-10-CM | POA: Diagnosis not present

## 2018-06-20 DIAGNOSIS — N401 Enlarged prostate with lower urinary tract symptoms: Secondary | ICD-10-CM | POA: Diagnosis not present

## 2018-06-20 DIAGNOSIS — R351 Nocturia: Secondary | ICD-10-CM | POA: Diagnosis not present

## 2018-06-20 DIAGNOSIS — N5201 Erectile dysfunction due to arterial insufficiency: Secondary | ICD-10-CM | POA: Diagnosis not present

## 2018-07-01 IMAGING — US US ABDOMEN LIMITED
1 series · 14 of 25 positions shown · non-contrast
Comparison: None.

CLINICAL DATA: Rule out cirrhosis.

EXAM:
ULTRASOUND ABDOMEN LIMITED RIGHT UPPER QUADRANT

[Series 1: us abdomen limited · 0.17mm/px · 14 of 53 slices shown]
[im 1/53]
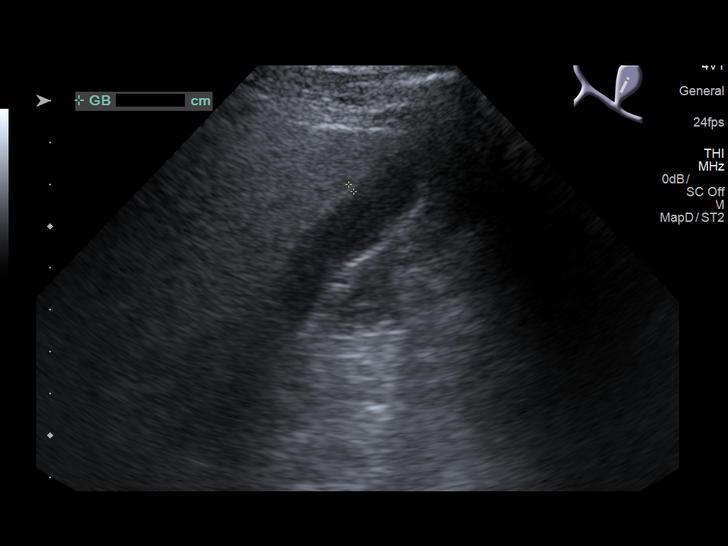
[im 5/53]
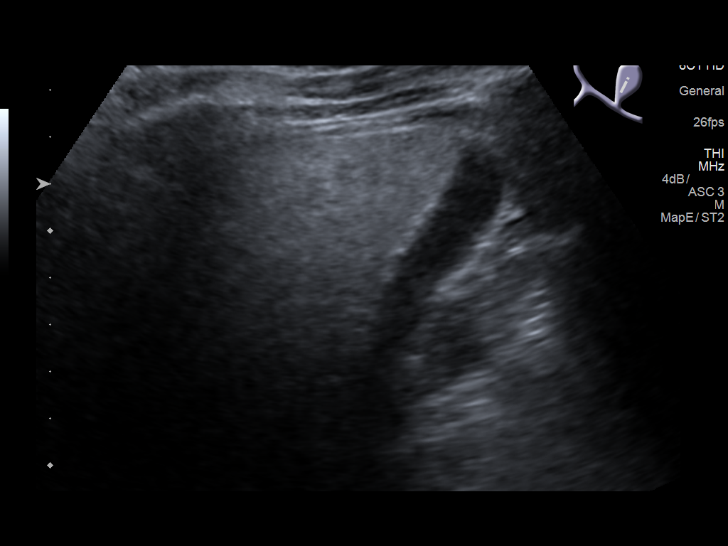
[im 9/53]
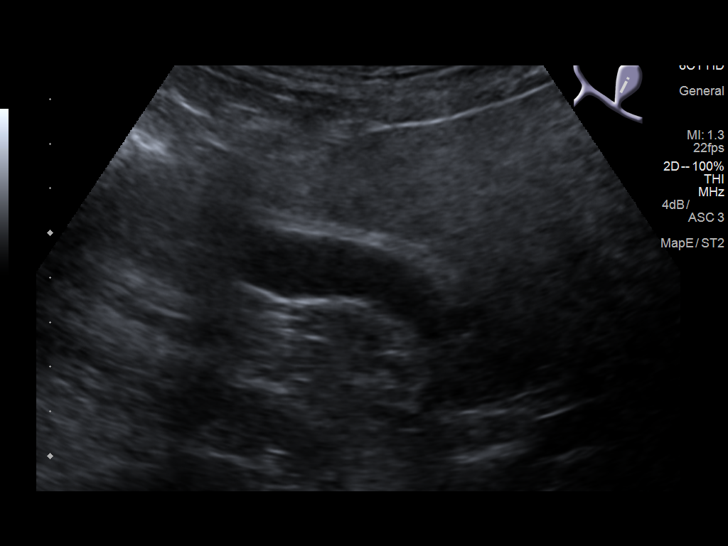
[im 14/53]
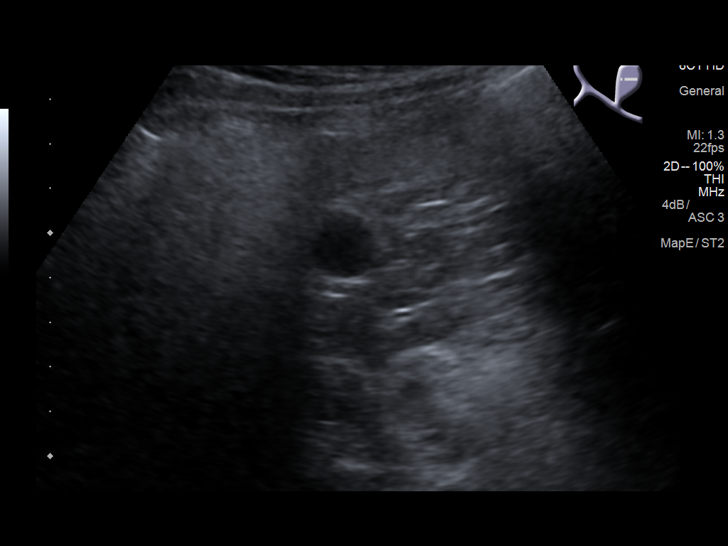
[im 18/53]
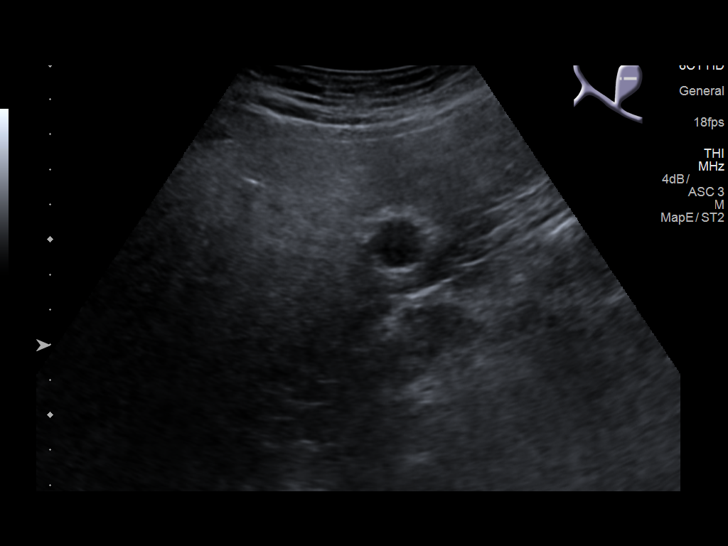
[im 20/53]
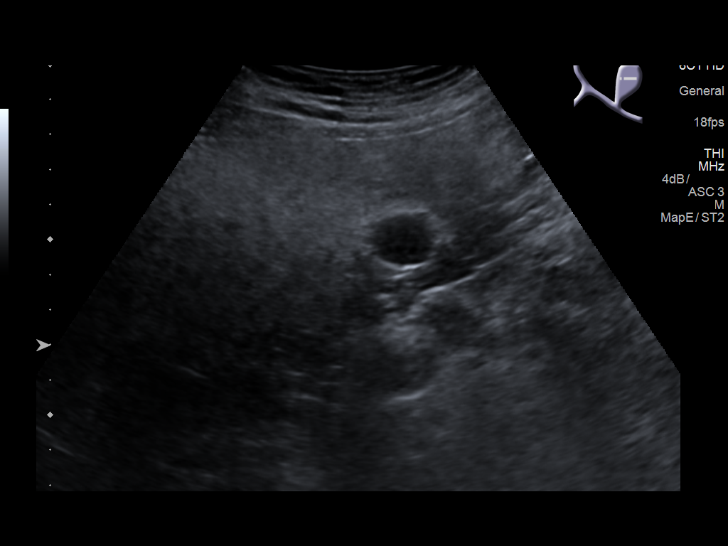
[im 24/53]
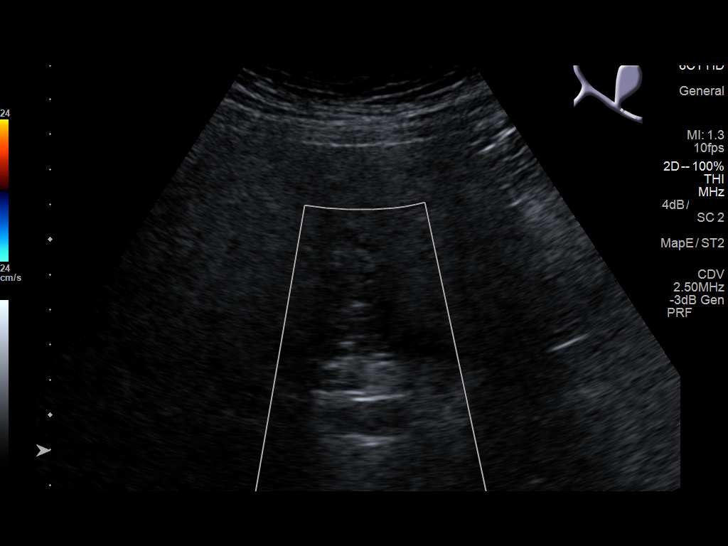
[im 29/53]
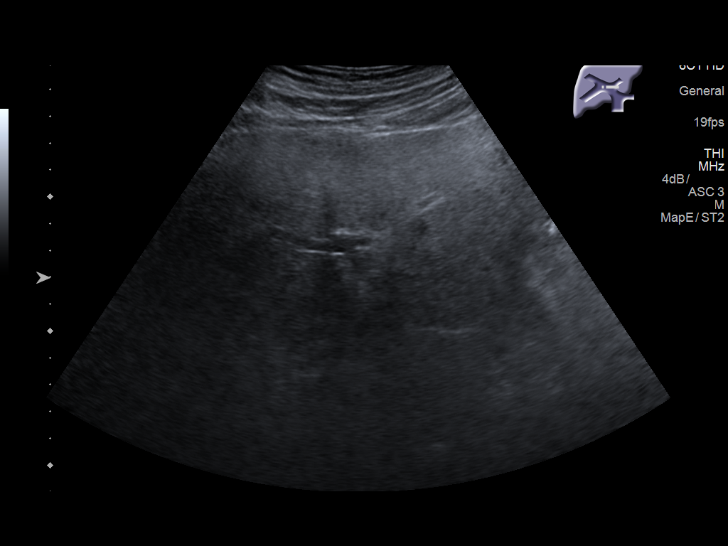
[im 33/53]
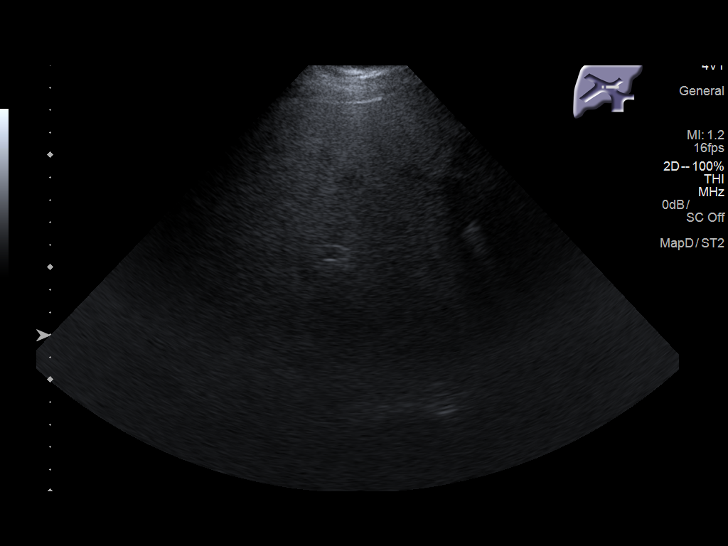
[im 35/53]
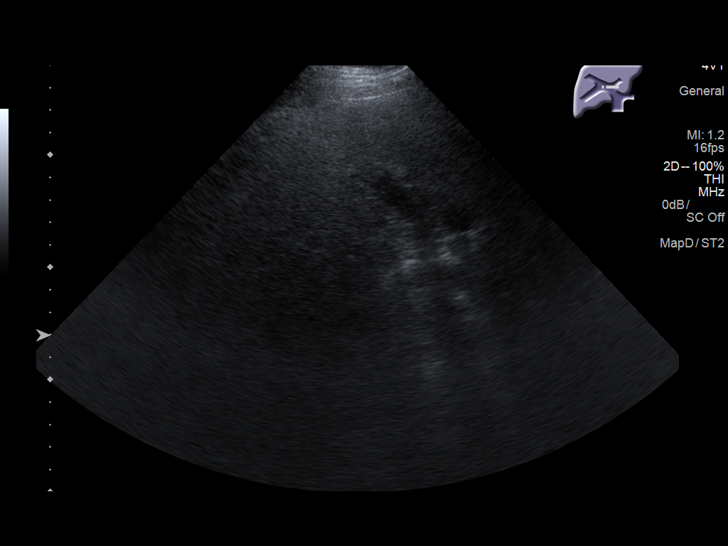
[im 40/53]
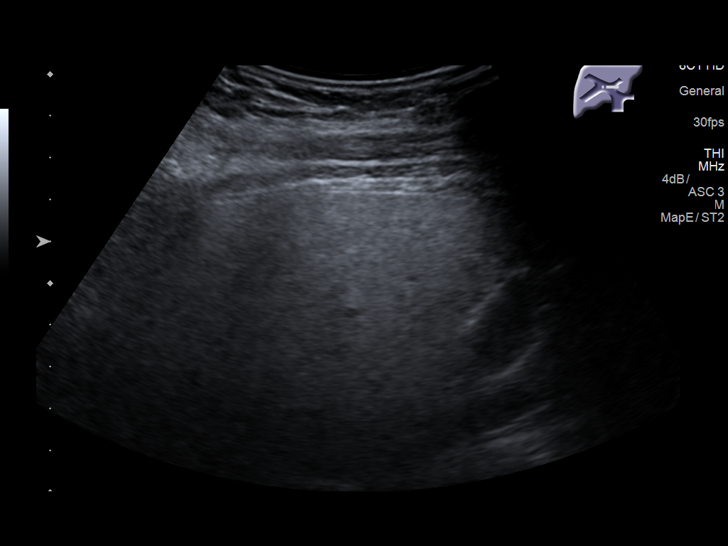
[im 44/53]
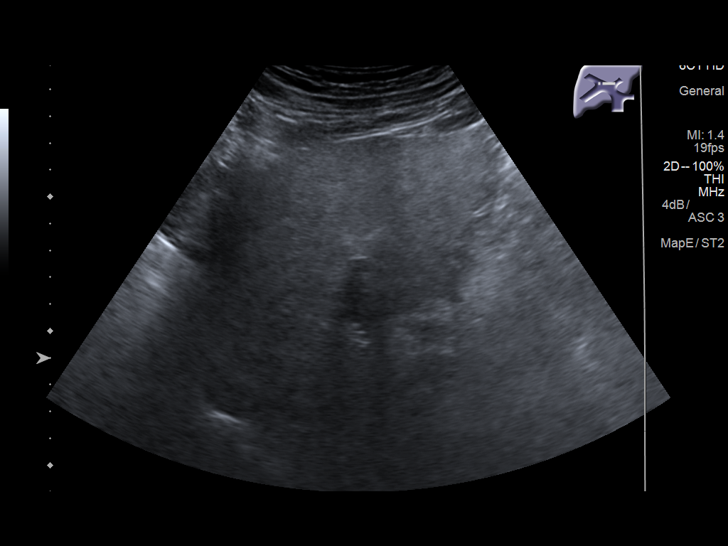
[im 48/53]
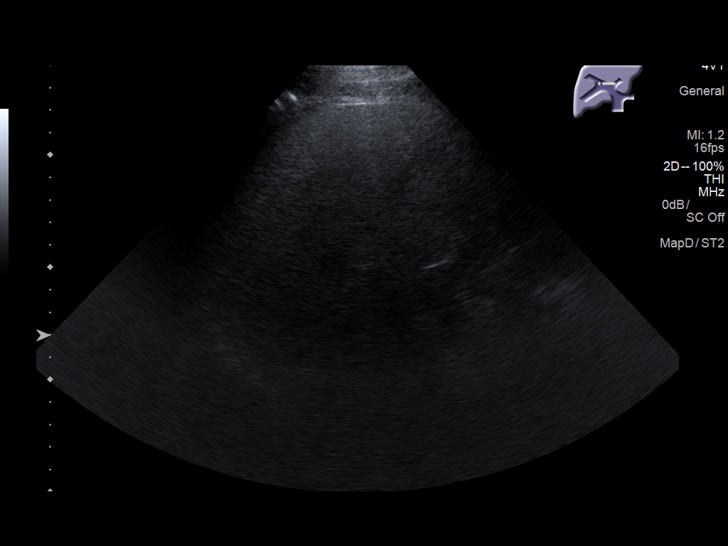
[im 53/53]
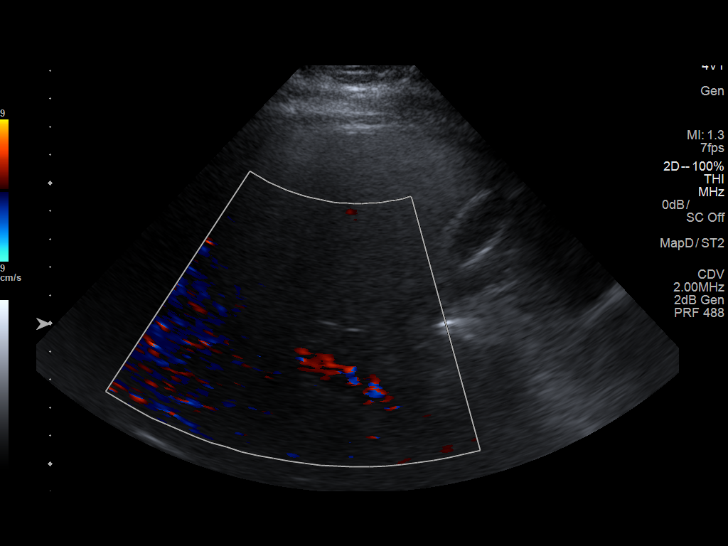

[14 of 25 positions shown; findings below may reference images not displayed]

FINDINGS: Gallbladder:

No gallstones or wall thickening visualized. No sonographic Murphy
sign noted by sonographer.

Common bile duct:

Diameter: 6.7 mm

Liver:

Increased echogenicity with no focal mass. Portal vein is patent on
color Doppler imaging with normal direction of blood flow towards
the liver.
IMPRESSION: 1. The common bile duct measures 6.7 mm which is borderline in a
patient of this age. Recommend clinical correlation and correlation
with labs.
2. Increased echogenicity in the liver is nonspecific but often seen
with hepatic steatosis. No nodular contour to definitively suggest
cirrhosis.
3. Nonobstructive 7 mm stone in the right kidney.

## 2018-07-23 DIAGNOSIS — G4733 Obstructive sleep apnea (adult) (pediatric): Secondary | ICD-10-CM | POA: Diagnosis not present

## 2018-07-30 DIAGNOSIS — Z23 Encounter for immunization: Secondary | ICD-10-CM | POA: Diagnosis not present

## 2018-08-01 ENCOUNTER — Encounter: Payer: Self-pay | Admitting: *Deleted

## 2018-08-01 DIAGNOSIS — Z006 Encounter for examination for normal comparison and control in clinical research program: Secondary | ICD-10-CM

## 2018-08-01 NOTE — Research (Signed)
OPTIMIZE research study: 6 month telephone follow up completed. Patient denies any adverse events of chest pain since last follow up . No changes in medication. I thanked the patient for his participation. [facility] research required follow up will be due between 22/Apr/2020---21/Jun/2020. At the next visit patient will need to come to research office for exam and EKG. Patient verbalized understanding.

## 2018-09-09 DIAGNOSIS — D225 Melanocytic nevi of trunk: Secondary | ICD-10-CM | POA: Diagnosis not present

## 2018-09-09 DIAGNOSIS — Z86018 Personal history of other benign neoplasm: Secondary | ICD-10-CM | POA: Diagnosis not present

## 2018-09-09 DIAGNOSIS — L219 Seborrheic dermatitis, unspecified: Secondary | ICD-10-CM | POA: Diagnosis not present

## 2018-09-09 DIAGNOSIS — Z85828 Personal history of other malignant neoplasm of skin: Secondary | ICD-10-CM | POA: Diagnosis not present

## 2018-09-09 DIAGNOSIS — Z23 Encounter for immunization: Secondary | ICD-10-CM | POA: Diagnosis not present

## 2018-09-09 DIAGNOSIS — L814 Other melanin hyperpigmentation: Secondary | ICD-10-CM | POA: Diagnosis not present

## 2018-09-09 DIAGNOSIS — L291 Pruritus scroti: Secondary | ICD-10-CM | POA: Diagnosis not present

## 2018-09-09 DIAGNOSIS — L821 Other seborrheic keratosis: Secondary | ICD-10-CM | POA: Diagnosis not present

## 2018-09-25 DIAGNOSIS — J3081 Allergic rhinitis due to animal (cat) (dog) hair and dander: Secondary | ICD-10-CM | POA: Diagnosis not present

## 2018-09-25 DIAGNOSIS — J301 Allergic rhinitis due to pollen: Secondary | ICD-10-CM | POA: Diagnosis not present

## 2018-09-25 DIAGNOSIS — R05 Cough: Secondary | ICD-10-CM | POA: Diagnosis not present

## 2018-09-25 DIAGNOSIS — J3089 Other allergic rhinitis: Secondary | ICD-10-CM | POA: Diagnosis not present

## 2018-11-07 DIAGNOSIS — H8110 Benign paroxysmal vertigo, unspecified ear: Secondary | ICD-10-CM | POA: Diagnosis not present

## 2018-11-07 DIAGNOSIS — J340 Abscess, furuncle and carbuncle of nose: Secondary | ICD-10-CM | POA: Diagnosis not present

## 2018-11-07 DIAGNOSIS — Z6836 Body mass index (BMI) 36.0-36.9, adult: Secondary | ICD-10-CM | POA: Diagnosis not present

## 2018-11-08 ENCOUNTER — Encounter (HOSPITAL_COMMUNITY): Payer: Self-pay | Admitting: *Deleted

## 2018-11-08 ENCOUNTER — Emergency Department (HOSPITAL_COMMUNITY)
Admission: EM | Admit: 2018-11-08 | Discharge: 2018-11-08 | Disposition: A | Discharge status: Home / Self Care | Payer: Medicare HMO | Attending: Emergency Medicine | Admitting: Emergency Medicine

## 2018-11-08 DIAGNOSIS — Z79899 Other long term (current) drug therapy: Secondary | ICD-10-CM | POA: Diagnosis not present

## 2018-11-08 DIAGNOSIS — Z7982 Long term (current) use of aspirin: Secondary | ICD-10-CM | POA: Insufficient documentation

## 2018-11-08 DIAGNOSIS — I2510 Atherosclerotic heart disease of native coronary artery without angina pectoris: Secondary | ICD-10-CM | POA: Diagnosis not present

## 2018-11-08 DIAGNOSIS — L03211 Cellulitis of face: Secondary | ICD-10-CM | POA: Diagnosis not present

## 2018-11-08 DIAGNOSIS — I252 Old myocardial infarction: Secondary | ICD-10-CM | POA: Insufficient documentation

## 2018-11-08 DIAGNOSIS — Z7902 Long term (current) use of antithrombotics/antiplatelets: Secondary | ICD-10-CM | POA: Diagnosis not present

## 2018-11-08 DIAGNOSIS — R220 Localized swelling, mass and lump, head: Secondary | ICD-10-CM | POA: Diagnosis present

## 2018-11-08 LAB — CBC WITH DIFFERENTIAL/PLATELET
Abs Immature Granulocytes: 0.02 10*3/uL (ref 0.00–0.07)
BASOS PCT: 1 %
Basophils Absolute: 0.1 10*3/uL (ref 0.0–0.1)
EOS PCT: 3 %
Eosinophils Absolute: 0.2 10*3/uL (ref 0.0–0.5)
HCT: 48.1 % (ref 39.0–52.0)
Hemoglobin: 15.7 g/dL (ref 13.0–17.0)
Immature Granulocytes: 0 %
Lymphocytes Relative: 18 %
Lymphs Abs: 0.9 10*3/uL (ref 0.7–4.0)
MCH: 32.4 pg (ref 26.0–34.0)
MCHC: 32.6 g/dL (ref 30.0–36.0)
MCV: 99.4 fL (ref 80.0–100.0)
MONO ABS: 0.7 10*3/uL (ref 0.1–1.0)
MONOS PCT: 14 %
Neutro Abs: 3.1 10*3/uL (ref 1.7–7.7)
Neutrophils Relative %: 64 %
Platelets: 204 10*3/uL (ref 150–400)
RBC: 4.84 MIL/uL (ref 4.22–5.81)
RDW: 13.1 % (ref 11.5–15.5)
WBC: 4.9 10*3/uL (ref 4.0–10.5)
nRBC: 0 % (ref 0.0–0.2)

## 2018-11-08 LAB — BASIC METABOLIC PANEL
ANION GAP: 6 (ref 5–15)
BUN: 18 mg/dL (ref 8–23)
CALCIUM: 10.3 mg/dL (ref 8.9–10.3)
CHLORIDE: 108 mmol/L (ref 98–111)
CO2: 26 mmol/L (ref 22–32)
CREATININE: 0.97 mg/dL (ref 0.61–1.24)
GFR calc Af Amer: >60 mL/min (ref >60)
GLUCOSE: 108 mg/dL — AB (ref 70–99)
POTASSIUM: 5.1 mmol/L (ref 3.5–5.1)
Sodium: 140 mmol/L (ref 135–145)

## 2018-11-08 MED ORDER — MUPIROCIN CALCIUM 2 % NA OINT: 1.0000 "application " | TOPICAL_OINTMENT | Freq: Two times a day (BID) | NASAL | 0 refills | Status: DC

## 2018-11-08 MED ORDER — VANCOMYCIN HCL IN DEXTROSE 1-5 GM/200ML-% IV SOLN
1000.0000 mg | Freq: Once | INTRAVENOUS | Status: AC
  Administered 2018-11-08: 1000 mg via INTRAVENOUS
  Filled 2018-11-08: qty 200

## 2018-11-08 NOTE — ED Triage Notes (Signed)
Pt complains of redness, swelling to right upper nose radiating towards right eye. Pt saw PCP yesterday and diagnosed with cellulitis. had the area swabbed for MRSA but has not gotten the report back yet. Pt was given ancef shot in office and started on kelfex yesterday  Redness has spread since. Pt denies pain

## 2018-11-08 NOTE — Discharge Instructions (Addendum)
Mr. Bradburn, Please start taking the Doxycyline prescription that you have filled.  You can discontinue the Keflex. I am prescribing you an antibiotic ointment to put in your nose as well. Please return if the redness and swelling worsens.

## 2018-11-08 NOTE — ED Provider Notes (Signed)
[facility] DEPT Provider Note   CSN: [id number] Arrival date & time: 11/08/18  0920     History   Chief Complaint Chief Complaint  Patient presents with  . Cellulitis    HPI Timothy Wilcox is a 78 y.o. male presenting with progressive redness and swelling on the right side of his nose. 4 days ago he noticed a small area of swelling at the external right naris that felt like a pimple. He tried warm compressed at home without improvement, and redness and swelling began to spread up his nose. He presented to his PCP yesterday and received a shot of Ancef and was given prescriptions for Keflex and Doxycycline. He was told to go the the ED if infection progressed. Patient only filled the Keflex and started taking it yesterday.  This morning when he woke up, infection had spread up his nose and under his right eye. No vision changes or pain with eye movements. Denies fevers, chills, n/v, or diarrhea.    Past Medical History:  Diagnosis Date  . Arthritis   . CAD (coronary artery disease) 02/26/2018   A. Inf STEMI 6/18: LHC - pLCx 60, dRCA 100, EF 45-50 >> PCI:  DES to RCA // B. Echo 8/18: EF 60-65, nwm, grade 1 dd, MAC. C. LHD DES to circ, patnet RCA stent  . Gout   . 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 Hyperlipemia    intol to some statins  . IBS (irritable bowel syndrome)   . Medication intolerance    severe SOB due to Brilinta >> changed to Plavix  . Renal disorder    kidney stones  . Seasonal allergies   . Sinus congestion    chronic  . Sleep apnea    uses a c-pap    Patient Active Problem List   Diagnosis Date Noted  . Dyspnea   . Acute respiratory failure with hypoxia ([place]) 03/18/2018  . Status post primary angioplasty with coronary stent 02/28/2018  . Angina pectoris ([place])   . CAD (coronary artery disease) 02/26/2018  . Shortness of breath 02/26/2018  . Acute coronary syndrome ([place])   . Hyperlipidemia 05/22/2017  . Old  MI (myocardial infarction) 04/03/2017    Past Surgical History:  Procedure Laterality Date  . COLONOSCOPY    . CORONARY STENT INTERVENTION N/A 04/03/2017   Procedure: Coronary Stent Intervention;  Surgeon: Sherren Mocha, MD;  Location: [facility];  Service: Cardiovascular;  Laterality: N/A;  . CORONARY STENT INTERVENTION N/A 02/27/2018   Procedure: CORONARY STENT INTERVENTION;  Surgeon: Troy Sine, MD;  Location: [facility];  Service: Cardiovascular;  Laterality: N/A;  . INCISION AND DRAINAGE  2011   infected finger  . LEFT HEART CATH AND CORONARY ANGIOGRAPHY N/A 04/03/2017   Procedure: Left Heart Cath and Coronary Angiography;  Surgeon: Sherren Mocha, MD;  Location: [facility];  Service: Cardiovascular;  Laterality: N/A;  . LEFT HEART CATH AND CORONARY ANGIOGRAPHY N/A 02/27/2018   Procedure: LEFT HEART CATH AND CORONARY ANGIOGRAPHY;  Surgeon: Troy Sine, MD;  Location: [facility];  Service: Cardiovascular;  Laterality: N/A;  . SHOULDER ARTHROSCOPY WITH ROTATOR CUFF REPAIR AND SUBACROMIAL DECOMPRESSION Right 01/29/2013   Procedure: RIGHT SHOULDER ARTHROSCOPY WITH SUBACROMIAL DECOMPRESSION, THREE TENDON ROTATOR CUFF REPAIR;  Surgeon: Cammie Sickle., MD;  Location: [facility];  Service: Orthopedics;  Laterality: Right;  . TOE DEBRIDEMENT     rt toe cyst  Home Medications    Prior to Admission medications   Medication Sig Start Date End Date Taking? Authorizing Provider  allopurinol (ZYLOPRIM) 300 MG tablet Take 300 mg by mouth daily.    [provider]  aspirin EC 81 MG tablet Take 81 mg by mouth daily.    [provider]  clopidogrel (PLAVIX) 75 MG tablet TAKE ONE TABLET BY MOUTH DAILY 04/25/18   Sherren Mocha, MD  fluticasone [facility]) 50 MCG/ACT nasal spray Place 1 spray into both nostrils 2 (two) times daily.    [provider]  loratadine (CLARITIN) 10 MG tablet Take 10 mg by mouth 2  (two) times daily.     [provider]  Multiple Vitamin (MULTIVITAMIN WITH MINERALS) TABS Take 1 tablet by mouth daily.    [provider]  mupirocin nasal ointment (BACTROBAN) 2 % Place 1 application into the nose 2 (two) times daily. Use one-half of tube in each nostril twice daily for five (5) days. After application, press sides of nose together and gently massage. 11/08/18   Aliviana Burdell D, DO  nitroGLYCERIN (NITROSTAT) 0.4 MG SL tablet Place 1 tablet (0.4 mg total) under the tongue every 5 (five) minutes x 3 doses as needed for chest pain. 04/05/17   Leanor Kail, PA  Probiotic Product (DAILY PROBIOTIC PO) Take 1 tablet by mouth daily.    [provider]  sildenafil (VIAGRA) 100 MG tablet Take 100 mg by mouth daily as needed for erectile dysfunction.     [provider]  tamsulosin (FLOMAX) 0.4 MG CAPS Take 0.4 mg by mouth as needed (used if he has a kidney stone).  03/24/12   Hyman Bible, PA-C    Family History Family History  Problem Relation Age of Onset  . Skin cancer Mother   . Pancreatic cancer Mother   . Skin cancer Father     Social History Social History   Tobacco Use  . Smoking status: Never Smoker  . Smokeless tobacco: Never Used  Substance Use Topics  . Alcohol use: Yes    Comment: a bottle of wine every night  . Drug use: No     Allergies   Brilinta [ticagrelor]; Statins; Dicyclomine; and Tagamet [cimetidine]   Review of Systems Review of Systems  All other systems reviewed and are negative.    Physical Exam Updated Vital Signs BP 122/78 (BP Location: Left Arm)   Pulse 73   Temp 97.6 F (36.4 C) (Oral)   Resp 14   Ht _0  (1.702 m)   Wt 99.8 kg   SpO2 100%   BMI 34.46 kg/m   Physical Exam Vitals signs reviewed.  Constitutional:      General: He is not in acute distress.    Appearance: He is not toxic-appearing.  HENT:     Nose:     Comments: Right nose with increased warmth, erythema and  swelling beginning at ala and moving proximally as well as mild swelling and erythema beneath right eyelid. No involvement of internal naris.  Eyes:     Extraocular Movements: Extraocular movements intact.     Conjunctiva/sclera: Conjunctivae normal.  Cardiovascular:     Rate and Rhythm: Normal rate and regular rhythm.     Heart sounds: Normal heart sounds.  Pulmonary:     Effort: Pulmonary effort is normal.     Breath sounds: Normal breath sounds.  Abdominal:     General: Bowel sounds are normal.     Palpations: Abdomen is soft.  Tenderness: There is no abdominal tenderness.  Musculoskeletal:        General: No swelling.  Neurological:     General: No focal deficit present.     Mental Status: He is alert and oriented to person, place, and time.      ED Treatments / Results  Labs (all labs ordered are listed, but only abnormal results are displayed) Labs Reviewed  BASIC METABOLIC PANEL - Abnormal; Notable for the following components:      Result Value   Glucose, Bld 108 (*)    All other components within normal limits  CBC WITH DIFFERENTIAL/PLATELET    EKG None  Radiology No results found.  Procedures Procedures (including critical care time)  Medications Ordered in ED Medications  vancomycin (VANCOCIN) IVPB 1000 mg/200 mL premix (1,000 mg Intravenous New Bag/Given 11/08/18 1157)     Initial Impression / Assessment and Plan / ED Course  I have reviewed the triage vital signs and the nursing notes.  Pertinent labs & imaging results that were available during my care of the patient were reviewed by me and considered in my medical decision making (see chart for details).   Patient presents with worsening non-purulent cellulitis to the right side of nose that has spread beneath eye. No orbital involvement. He is afebrile without other signs or symptoms of infection. He received shot of Ancef at his PCP's office yesterday and was given Keflex and Doxycycline.  However, he only filled the Keflex. Will check CBC and BMP. Given that his has spread since yesterday, will give dose of IV vancomycin to cover MRSA and will plan to discharge on Doxy and nasal mupirocin.     Final Clinical Impressions(s) / ED Diagnoses   Final diagnoses:  Cellulitis of face   Patient medically stable for discharge. He was instructed to take Doxycycline he had already filled, as well as nasal mupirocin. Given strict return precautions.   ED Discharge Orders         Ordered    mupirocin nasal ointment (BACTROBAN) 2 %  2 times daily     11/08/18 [address] D, DO 11/08/18 1259    Isla Pence, MD 11/08/18 1315

## 2018-11-19 DIAGNOSIS — H524 Presbyopia: Secondary | ICD-10-CM | POA: Diagnosis not present

## 2018-12-03 ENCOUNTER — Ambulatory Visit: Payer: Medicare HMO | Admitting: Cardiovascular Disease

## 2018-12-03 ENCOUNTER — Encounter: Payer: Self-pay | Admitting: Cardiovascular Disease

## 2018-12-03 VITALS — BP 150/72 | HR 75 | Ht 67.0 in | Wt 226.1 lb

## 2018-12-03 DIAGNOSIS — E782 Mixed hyperlipidemia: Secondary | ICD-10-CM | POA: Diagnosis not present

## 2018-12-03 DIAGNOSIS — I2510 Atherosclerotic heart disease of native coronary artery without angina pectoris: Secondary | ICD-10-CM

## 2018-12-03 DIAGNOSIS — I10 Essential (primary) hypertension: Secondary | ICD-10-CM

## 2018-12-03 DIAGNOSIS — R0602 Shortness of breath: Secondary | ICD-10-CM | POA: Diagnosis not present

## 2018-12-03 NOTE — Progress Notes (Signed)
Cardiology Office Note:    Date:  12/03/2018   ID:  Loreto, Loescher 1941-08-13, MRN [id number]  PCP:  Hulan Fess, MD  Cardiologist:  Sherren Mocha, MD  Electrophysiologist:  None   Referring MD: Hulan Fess, MD   Chief Complaint  Patient presents with  . Shortness of Breath   History of Present Illness:    JHAMIR PICKUP is a 78 y.o. male with a hx of coronary artery disease and chronic shortness of breath, presenting for follow-up evaluation.  The patient has coronary artery disease.  He presented with an inferior STEMI in 2018 and was treated with a drug-eluting stent in the right coronary artery.  He could not tolerate ticagrelor because of shortness of breath.  He was switched to clopidogrel with a good response.  He then presented in May 2019 with progressive dyspnea.  He underwent repeat cardiac catheterization as this was his presenting symptom before.  This demonstrated patency of his stent site but progressive and severe stenosis of the left circumflex.  He underwent uncomplicated stenting and continued on clopidogrel. The patient really never got better after stenting of his circumflex with respect to his breathing.  He underwent extensive evaluation including VQ scan, CT pulmonary embolism study, echocardiography, and cardiopulmonary exercise testing.  He also underwent formal pulmonary evaluation.  No clear cause of his dyspnea was found and a diet and exercise program was recommended.  He then followed up with an allergist and underwent some testing that demonstrated findings consistent with asthma.  He had modest improvement with use of Flovent and Singulair.  He continues to have limitation from shortness of breath.  He also complains of generalized fatigue.  He denies orthopnea, PND, or chest pain.  He denies leg swelling.  Past Medical History:  Diagnosis Date  . Arthritis   . CAD (coronary artery disease) 02/26/2018   A. Inf STEMI 6/18: LHC - pLCx 60, dRCA  100, EF 45-50 >> PCI:  DES to RCA // B. Echo 8/18: EF 60-65, nwm, grade 1 dd, MAC. C. LHD DES to circ, patnet RCA stent  . Gout   . 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 Hyperlipemia    intol to some statins  . IBS (irritable bowel syndrome)   . Medication intolerance    severe SOB due to Brilinta >> changed to Plavix  . Renal disorder    kidney stones  . Seasonal allergies   . Sinus congestion    chronic  . Sleep apnea    uses a c-pap    Past Surgical History:  Procedure Laterality Date  . COLONOSCOPY    . CORONARY STENT INTERVENTION N/A 04/03/2017   Procedure: Coronary Stent Intervention;  Surgeon: Sherren Mocha, MD;  Location: [facility];  Service: Cardiovascular;  Laterality: N/A;  . CORONARY STENT INTERVENTION N/A 02/27/2018   Procedure: CORONARY STENT INTERVENTION;  Surgeon: Troy Sine, MD;  Location: [facility];  Service: Cardiovascular;  Laterality: N/A;  . INCISION AND DRAINAGE  2011   infected finger  . LEFT HEART CATH AND CORONARY ANGIOGRAPHY N/A 04/03/2017   Procedure: Left Heart Cath and Coronary Angiography;  Surgeon: Sherren Mocha, MD;  Location: [facility];  Service: Cardiovascular;  Laterality: N/A;  . LEFT HEART CATH AND CORONARY ANGIOGRAPHY N/A 02/27/2018   Procedure: LEFT HEART CATH AND CORONARY ANGIOGRAPHY;  Surgeon: Troy Sine, MD;  Location: [facility];  Service: Cardiovascular;  Laterality: N/A;  .  SHOULDER ARTHROSCOPY WITH ROTATOR CUFF REPAIR AND SUBACROMIAL DECOMPRESSION Right 01/29/2013   Procedure: RIGHT SHOULDER ARTHROSCOPY WITH SUBACROMIAL DECOMPRESSION, THREE TENDON ROTATOR CUFF REPAIR;  Surgeon: Cammie Sickle., MD;  Location: [facility];  Service: Orthopedics;  Laterality: Right;  . TOE DEBRIDEMENT     rt toe cyst    Current Medications: Current Meds  Medication Sig  . allopurinol (ZYLOPRIM) 300 MG tablet Take 300 mg by mouth daily.  Marland Kitchen aspirin EC 81 MG tablet Take 81 mg by  mouth daily.  Marland Kitchen FLOVENT HFA 110 MCG/ACT inhaler Take 1 puff by mouth 2 (two) times daily.  . fluticasone (FLONASE) 50 MCG/ACT nasal spray Place 1 spray into both nostrils 2 (two) times daily.  Marland Kitchen ibuprofen (ADVIL,MOTRIN) 200 MG tablet Take 200 mg by mouth every 6 (six) hours as needed.  . loratadine (CLARITIN) 10 MG tablet Take 10 mg by mouth 2 (two) times daily.   . montelukast (SINGULAIR) 10 MG tablet Take 1 tablet by mouth daily.  . Multiple Vitamin (MULTIVITAMIN WITH MINERALS) TABS Take 1 tablet by mouth daily.  . nitroGLYCERIN (NITROSTAT) 0.4 MG SL tablet Place 1 tablet (0.4 mg total) under the tongue every 5 (five) minutes x 3 doses as needed for chest pain.  . Probiotic Product (DAILY PROBIOTIC PO) Take 1 tablet by mouth daily.  . sildenafil (VIAGRA) 100 MG tablet Take 100 mg by mouth daily as needed for erectile dysfunction.   . Wheat Dextrin (BENEFIBER PO) Take 1 tablet by mouth daily.  . [DISCONTINUED] clopidogrel (PLAVIX) 75 MG tablet TAKE ONE TABLET BY MOUTH DAILY     Allergies:   Brilinta [ticagrelor]; Statins; Dicyclomine; and Tagamet [cimetidine]   Social History   Socioeconomic History  . Marital status: Married    Spouse name: Not on file  . Number of children: Not on file  . Years of education: Not on file  . Highest education level: Not on file  Occupational History  . Not on file  Social Needs  . Financial resource strain: Not on file  . Food insecurity:    Worry: Not on file    Inability: Not on file  . Transportation needs:    Medical: Not on file    Non-medical: Not on file  Tobacco Use  . Smoking status: Never Smoker  . Smokeless tobacco: Never Used  Substance and Sexual Activity  . Alcohol use: Yes    Comment: a bottle of wine every night  . Drug use: No  . Sexual activity: Not on file  Lifestyle  . Physical activity:    Days per week: Not on file    Minutes per session: Not on file  . Stress: Not on file  Relationships  . Social connections:     Talks on phone: Not on file    Gets together: Not on file    Attends religious service: Not on file    Active member of club or organization: Not on file    Attends meetings of clubs or organizations: Not on file    Relationship status: Not on file  Other Topics Concern  . Not on file  Social History Narrative  . Not on file     Family History: The patient's family history includes Pancreatic cancer in his mother; Skin cancer in his father and mother.  ROS:   Please see the history of present illness.    All other systems reviewed and are negative.  EKGs/Labs/Other Studies Reviewed:  The following studies were reviewed today: Cath 02-27-2018: Conclusion     Previously placed Dist RCA stent (unknown type) is widely patent.  Prox Cx lesion is 80% stenosed.  Post intervention, there is a 0% residual stenosis.  The left ventricular systolic function is normal.  A stent was successfully placed.   Preserved to low normal LV function with minimal residual mid to basal inferior hypocontractility and an ejection fraction of 50%.  LVEDP 10 mmHg.  Coronary obstructive disease with a normal LAD, normal ramus intermediate vessel, progressive 80% proximal left circumflex stenosis, and widely patent previously placed distal RCA stent at the site of prior inferior STEMI.  Successful PCI to the 80% left circumflex stenosis with the patient participating in the [place] stent protocol with ultimate insertion of a 3.0 x 13 mm sirolimus DES stent postdilated to 3.31 mm with the 80% stenosis being reduced to 0%.  RECOMMENDATION: Continue DAPT for at least a year.  Medical therapy.  The patient has intolerance to statins.  Consider Zetia.   Coronary Findings   Diagnostic  Dominance: Right  Left Circumflex  Prox Cx lesion 80% stenosed  Prox Cx lesion is 80% stenosed.  Right Coronary Artery  Dist RCA lesion 0% stenosed  Previously placed Dist RCA stent (unknown type) is  widely patent.  Intervention   Prox Cx lesion  Stent  A stent was successfully placed.  Post-Intervention Lesion Assessment  The intervention was successful. Pre-interventional TIMI flow is 3. Post-intervention TIMI flow is 3. No complications occurred at this lesion.  There is a 0% residual stenosis post intervention.  Wall Motion              Left Heart   Left Ventricle The left ventricular systolic function is normal. There is preserved overall LV function with an EF of approximately 50%. There is mild mid to basal inferior hypocontractility. LVEDP 10 mmHg.  Coronary Diagrams   Diagnostic  Dominance: Right    Intervention     Echo 03/19/2018: Study Conclusions  - Left ventricle: The cavity size was normal. Systolic function was   normal. The estimated ejection fraction was in the range of 55%   to 60%. Wall motion was normal; there were no regional wall   motion abnormalities. The study is not technically sufficient to   allow evaluation of LV diastolic function. - Mitral valve: Calcified annulus. Systolic bowing without   prolapse. - Atrial septum: No defect or patent foramen ovale was identified.   Echo contrast study showed no right-to-left atrial level shunt,   at baseline or with provocation. No late shunting is seen.  CPX 04-09-2018:  Interpretation  Notes: Patient arrived short of breath from the walk in, however all resting parameters within normal range. Exercise was performed on a cycle ergometer starting at [facility] and increasing by 10W/min. Pulse-oximetry remained 99-100% for the duration of exercise.   ECG: Resting ECG in sinus rhythm with occasional isolated PVCs. HR response normal. There were frequent PVCs progressing to ventricular trigeminy during exercise that resolved during recovery. There were no sustained arrhythmias or significant ST-T changes. BP response normal.   PFT: Pre-exercise spirometry was within normal limits. The MVV was normal.    CPX: Exercise testing with gas exchange demonstrates a normal peak VO2 of 20.4 ml/kg/min (109% of the age/gender/weight matched sedentary norms). The RER of 1.08 indicates a near maximal effort. When adjusted to the patient's ideal body weight of 162.6 lb (73.7 kg) the peak VO2 is 27.5 ml/kg (  ibw)/min (119% of the ibw-adjusted predicted). The VE/VCO2 slope is elevated and indicates increased dead space ventilation. The oxygen uptake efficiency slope (OUES) is normal. The VO2 at the ventilatory threshold was normal at 84% of the predicted peak VO2 and 78% measured PVO. At peak exercise, the ventilation reached 93% of the measured MVV indicating ventilatory reserve was depleted. The O2pulse (a surrogate for stroke volume) increased with incremental exercise, reaching peak at 16 ml/beat (123% predicted).    Conclusion: Exercise testing with gas exchange demonstrates normal functional impairment when compared to matched sedentary norms. There is no clear evidence for cardiopulmonary limitation. At peak exercise patient is ventilatory limited due to his body habitus.   Test, report and preliminary impression by: Landis Martins, MS, ACSM-RCEP 04/09/2018 4:18 PM  Agree with above. Normal functional capacity. At peak exercise patient limited by his ventilation. Suspect majority of his exercise intolerance due to obesity and related ventilatory limitation. However mildly elevated VE/VCO2 slope may indicate a component of diastolic dysfunction. Suggest weight loss and exercise program with retesting if symptoms persist/worsen.   EKG:  EKG is not ordered today.    Recent Labs: 02/26/2018: ALT 43; TSH 1.471 03/18/2018: B Natriuretic Peptide 20.8 11/08/2018: BUN 18; Creatinine, Ser 0.97; Hemoglobin 15.7; Platelets 204; Potassium 5.1; Sodium 140  Recent Lipid Panel    Component Value Date/Time   CHOL 148 02/27/2018 0220   CHOL 165 07/15/2017 1003   TRIG 208 (H) 02/27/2018 0220   HDL 45 02/27/2018 0220   HDL  63 07/15/2017 1003   CHOLHDL 3.3 02/27/2018 0220   VLDL 42 (H) 02/27/2018 0220   LDLCALC 61 02/27/2018 0220   LDLCALC 55 07/15/2017 1003    Physical Exam:    VS:  BP (!) 150/72   Pulse 75   Ht _0  (1.702 m)   Wt 226 lb 1.9 oz (102.6 kg)   SpO2 93%   BMI 35.42 kg/m     Wt Readings from Last 3 Encounters:  12/03/18 226 lb 1.9 oz (102.6 kg)  11/08/18 220 lb (99.8 kg)  06/19/18 221 lb (100.2 kg)     GEN:  Well nourished, well developed obese male in no acute distress HEENT: Normal NECK: No JVD; No carotid bruits LYMPHATICS: No lymphadenopathy CARDIAC: RRR, no murmurs, rubs, gallops RESPIRATORY:  Clear to auscultation without rales, wheezing or rhonchi  ABDOMEN: Soft, non-tender, non-distended, obese MUSCULOSKELETAL:  No edema; No deformity  SKIN: Warm and dry NEUROLOGIC:  Alert and oriented x 3 PSYCHIATRIC:  Normal affect   ASSESSMENT:    1. Hypertension, unspecified type   2. Coronary artery disease involving native coronary artery of native heart without angina pectoris   3. Shortness of breath   4. Mixed hyperlipidemia    PLAN:    In order of problems listed above:  1. The patient reports erratic home blood pressure readings.  I recommended a 24-hour monitor.  He will continue his current medicines at this time. 2. The patient is having no anginal symptoms.  We reviewed all of his previous studies and have recommended continued medical management.  He is greater than 6 months out from PCI in the context of stable angina and I have recommended discontinuation of clopidogrel.  He should remain on aspirin long-term. 3. His previous evaluation is reviewed.  He has had some improvement with use of inhalers and medications for asthma.  I think obesity and deconditioning is the primary driver of his shortness of breath.  We had extensive discussion today  about dietary recommendations.  He has done better in the past on a low protein diet and I encouraged him to revisit this  as he has been able to sustain it for several years and kept his weight down much better. 4. The patient is intolerant to lipid-lowering therapies.  He will work on lifestyle modification.   Medication Adjustments/Labs and Tests Ordered: Current medicines are reviewed at length with the patient today.  Concerns regarding medicines are outlined above.  Orders Placed This Encounter  Procedures  . HOLTER MONITOR - 24 HOUR   No orders of the defined types were placed in this encounter.   Patient Instructions  Medication Instructions:  1) STOP PLAVIX  Labwork: None  Testing/Procedures: Dr. Burt Knack recommends you wear an AMBULATORY BLOOD PRESSURE MONITOR.  Follow-Up: Your provider wants you to follow-up in: 6 months with Dr. Burt Knack or his assistant. You will receive a reminder letter in the mail two months in advance. If you don't receive a letter, please call our office to schedule the follow-up appointment.         Signed, Sherren Mocha, MD  12/03/2018 12:55 PM    [place]

## 2018-12-03 NOTE — Patient Instructions (Signed)
Medication Instructions:  1) STOP PLAVIX  Labwork: None  Testing/Procedures: Dr. Burt Knack recommends you wear an AMBULATORY BLOOD PRESSURE MONITOR.  Follow-Up: Your provider wants you to follow-up in: 6 months with Dr. Burt Knack or his assistant. You will receive a reminder letter in the mail two months in advance. If you don't receive a letter, please call our office to schedule the follow-up appointment.

## 2019-02-11 ENCOUNTER — Encounter: Payer: Self-pay | Admitting: *Deleted

## 2019-02-11 DIAGNOSIS — Z006 Encounter for examination for normal comparison and control in clinical research program: Secondary | ICD-10-CM

## 2019-02-11 NOTE — Research (Addendum)
[place] study 12 month telephone follow up completed. (COVID-19). Patient doing much better with breathing issues. He states that he stopped plavix a few months ago and thinks SOB improved after that. He denies chest pain,  No other changes in medication or any Adverse events. Next research required follow up will be a call in 1 year. I thanked him for his participation.

## 2019-05-26 DIAGNOSIS — I2510 Atherosclerotic heart disease of native coronary artery without angina pectoris: Secondary | ICD-10-CM | POA: Diagnosis not present

## 2019-05-26 DIAGNOSIS — E782 Mixed hyperlipidemia: Secondary | ICD-10-CM | POA: Diagnosis not present

## 2019-05-26 DIAGNOSIS — N401 Enlarged prostate with lower urinary tract symptoms: Secondary | ICD-10-CM | POA: Diagnosis not present

## 2019-06-03 ENCOUNTER — Ambulatory Visit: Payer: Medicare HMO | Admitting: Physician Assistant

## 2019-06-05 ENCOUNTER — Telehealth: Payer: Self-pay

## 2019-06-05 NOTE — Telephone Encounter (Signed)
Spoke with pt and went over meds

## 2019-06-07 NOTE — Progress Notes (Signed)
Virtual Visit via Video Note   This visit type was conducted due to national recommendations for restrictions regarding the COVID-19 Pandemic (e.g. social distancing) in an effort to limit this patient's exposure and mitigate transmission in our community.  Due to his co-morbid illnesses, this patient is at least at moderate risk for complications without adequate follow up.  This format is felt to be most appropriate for this patient at this time.  All issues noted in this document were discussed and addressed.  A limited physical exam was performed with this format.  Please refer to the patient's chart for his consent to telehealth for [facility].   Date:  06/08/2019   ID:  Delphin, Funes 30-Jun-1941, MRN [id number]  Patient Location: Home Provider Location: Home  PCP:  Hulan Fess, MD  Cardiologist:  Sherren Mocha, MD   Electrophysiologist:  None   Evaluation Performed:  Follow-Up Visit  Chief Complaint:  CAD  History of Present Illness:    DAMARKO STITELY is a 78 y.o. male with:  Coronary artery disease   S/p Inf STEMI in 03/2017 >> PCI: DES to dist RCA  side effects to Ticagrelor >> changed to Clopidogrel   [place] 5/19 >> PCI: DES to pLCx; RCA stent patent  Hyperlipidemia   OSA  Chronic shortness of breath   Prior w/u with Chest CTA, VQ scan, CPET, Echocardiogram and PFTs - no clear cause found  Asthma    Mr. Seales was last seen by Dr. Burt Knack in 11/2018.  Today, he notes he is doing well.  He was taken off of Plavix after his last appointment.  Since then, he has felt dramatically better.  He feels that his breathing is now normal.  He has not had any chest discomfort.  He has not had syncope, leg swelling.  The patient does not have symptoms concerning for COVID-19 infection (fever, chills, cough, or new shortness of breath).    Past Medical History:  Diagnosis Date  . Arthritis   . CAD (coronary artery disease) 02/26/2018   A. Inf STEMI  6/18: LHC - pLCx 60, dRCA 100, EF 45-50 >> PCI:  DES to RCA // B. Echo 8/18: EF 60-65, nwm, grade 1 dd, MAC. C. LHD DES to circ, patnet RCA stent  . Gout   . 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 Hyperlipemia    intol to some statins  . IBS (irritable bowel syndrome)   . Medication intolerance    severe SOB due to Brilinta >> changed to Plavix  . Renal disorder    kidney stones  . Seasonal allergies   . Sinus congestion    chronic  . Sleep apnea    uses a c-pap   Past Surgical History:  Procedure Laterality Date  . COLONOSCOPY    . CORONARY STENT INTERVENTION N/A 04/03/2017   Procedure: Coronary Stent Intervention;  Surgeon: Sherren Mocha, MD;  Location: [place] [facility];  Service: Cardiovascular;  Laterality: N/A;  . CORONARY STENT INTERVENTION N/A 02/27/2018   Procedure: CORONARY STENT INTERVENTION;  Surgeon: Troy Sine, MD;  Location: [place] [facility];  Service: Cardiovascular;  Laterality: N/A;  . INCISION AND DRAINAGE  2011   infected finger  . LEFT HEART CATH AND CORONARY ANGIOGRAPHY N/A 04/03/2017   Procedure: Left Heart Cath and Coronary Angiography;  Surgeon: Sherren Mocha, MD;  Location: [place] [facility];  Service: Cardiovascular;  Laterality: N/A;  . LEFT HEART CATH AND CORONARY ANGIOGRAPHY  N/A 02/27/2018   Procedure: LEFT HEART CATH AND CORONARY ANGIOGRAPHY;  Surgeon: Troy Sine, MD;  Location: [place] [facility];  Service: Cardiovascular;  Laterality: N/A;  . SHOULDER ARTHROSCOPY WITH ROTATOR CUFF REPAIR AND SUBACROMIAL DECOMPRESSION Right 01/29/2013   Procedure: RIGHT SHOULDER ARTHROSCOPY WITH SUBACROMIAL DECOMPRESSION, THREE TENDON ROTATOR CUFF REPAIR;  Surgeon: Cammie Sickle., MD;  Location: [place];  Service: Orthopedics;  Laterality: Right;  . TOE DEBRIDEMENT     rt toe cyst     Current Meds  Medication Sig  . allopurinol (ZYLOPRIM) 300 MG tablet Take 300 mg by mouth daily.  Marland Kitchen aspirin EC 81 MG tablet Take 81 mg by  mouth daily.  . fluticasone (FLONASE) 50 MCG/ACT nasal spray Place 1 spray into both nostrils 2 (two) times daily.  Marland Kitchen ibuprofen (ADVIL,MOTRIN) 200 MG tablet Take 200 mg by mouth every 6 (six) hours as needed.  . loratadine (CLARITIN) 10 MG tablet Take 10 mg by mouth 2 (two) times daily.   . Multiple Vitamin (MULTIVITAMIN WITH MINERALS) TABS Take 1 tablet by mouth daily.  . nitroGLYCERIN (NITROSTAT) 0.4 MG SL tablet Place 1 tablet (0.4 mg total) under the tongue every 5 (five) minutes x 3 doses as needed for chest pain.  . Probiotic Product (DAILY PROBIOTIC PO) Take 1 tablet by mouth daily.  . sildenafil (VIAGRA) 100 MG tablet Take 100 mg by mouth daily as needed for erectile dysfunction.   . Wheat Dextrin (BENEFIBER PO) Take 1 tablet by mouth daily.     Allergies:   Brilinta [ticagrelor], Statins, Dicyclomine, Plavix [clopidogrel], and Tagamet [cimetidine]   Social History   Tobacco Use  . Smoking status: Never Smoker  . Smokeless tobacco: Never Used  Substance Use Topics  . Alcohol use: Yes    Comment: a bottle of wine every night  . Drug use: No     Family Hx: The patient's family history includes Pancreatic cancer in his mother; Skin cancer in his father and mother.  ROS:   Please see the history of present illness.    All other systems reviewed and are negative.   Prior CV studies:   The following studies were reviewed today:  CPET 04/09/18 Conclusion: Exercise testing with gas exchange demonstrates normal functional impairment when compared to matched sedentary norms. There is no clear evidence for cardiopulmonary limitation. At peak exercise patient is ventilatory limited due to his body habitus.   Agree with above. Normal functional capacity. At peak exercise patient limited by his ventilation. Suspect majority of his exercise intolerance due to obesity and related ventilatory limitation. However mildly elevated VE/VCO2 slope may indicate a component of diastolic  dysfunction. Suggest weight loss and exercise program with retesting if symptoms persist/worsen.    Echocardiogram 03/19/18 (Bubble Study)  EF 55-60, no RWMA, MAC, Echocardiogram contrast with no R-L atrial level shunt   Chest CTA 03/18/18 IMPRESSION: No evidence of pulmonary embolism.  Peribronchial thickening without infiltrate.  Scattered atherosclerotic disease changes including coronary arteries.  Small hiatal hernia.  Aortic Atherosclerosis (ICD10-I70.0).  Cardiac catheterization 02/27/18 LCx prox 80 RCA dist sent patent EF 50 PCI:  3 x 13 mm Sirolimus DES to pLCx (Optimize Study Stent protocol)      Echocardiogram 02/27/18 EF 55-60, no rEWMA, Gr 1 DD, MAC, mild reduced RVSF  Echo 05/22/2017 EF 60-65, normal wall motion, grade 1 diastolic dysfunction, MAC  Cardiac catheterization 04/03/2017 LAD luminal irregularities LCx proximal 60 RCA distal 100 EF 45-50, inferior hypokinesis PCI:  2.75 x 16 mm Promus DES to the distal RCA   Labs/Other Tests and Data Reviewed:    EKG:  No ECG reviewed.  Recent Labs: 11/08/2018: BUN 18; Creatinine, Ser 0.97; Hemoglobin 15.7; Platelets 204; Potassium 5.1; Sodium 140   Recent Lipid Panel Lab Results  Component Value Date/Time   CHOL 148 02/27/2018 02:20 AM   CHOL 165 07/15/2017 10:03 AM   TRIG 208 (H) 02/27/2018 02:20 AM   HDL 45 02/27/2018 02:20 AM   HDL 63 07/15/2017 10:03 AM   CHOLHDL 3.3 02/27/2018 02:20 AM   LDLCALC 61 02/27/2018 02:20 AM   LDLCALC 55 07/15/2017 10:03 AM     Wt Readings from Last 3 Encounters:  06/08/19 215 lb (97.5 kg)  12/03/18 226 lb 1.9 oz (102.6 kg)  11/08/18 220 lb (99.8 kg)     Objective:    Vital Signs:  BP 138/79   Pulse 70   Ht _0  (1.702 m)   Wt 215 lb (97.5 kg)   SpO2 95%   BMI 33.67 kg/m    VITAL SIGNS:  reviewed GEN:  no acute distress EYES:  Sclera anicteric RESPIRATORY:  Normal respiratory effort NEURO:  alert and oriented x 3, no obvious focal deficit PSYCH:   normal affect  ASSESSMENT & PLAN:    1. Coronary artery disease involving native coronary artery of native heart without angina pectoris History of inferior MI in 2018 treated with drug-eluting stent to the RCA.  He underwent drug-eluting stent to the LCx in May 2019 due to unstable angina pectoris.  He has had chronic shortness of breath with essentially negative work-up over the years.  He now notes dramatically improved breathing after stopping Plavix.  Therefore, it seems that he was having side effects to this drug.  He also had significant side effects to Brilinta.  Should he need PCI in the future, we will need to determine whether or not he can use Effient.  Continue aspirin therapy.  He has intolerance to statins.  2. Hyperlipidemia, unspecified hyperlipidemia type His lipids will be obtained by his primary care doctor in the next few weeks.  If his LDL remains above goal, we could consider Zetia or PCSK9 inhibitor therapy.  3. Essential hypertension Fair control.  His systolic pressures usually range 130s to 140.  We discussed limiting salt, diet, exercise and weight loss to keep his blood pressure control.  4. Educated About Covid-19 Virus Infection The signs and symptoms of COVID-19 were discussed with the patient and how to seek care for testing (follow up with PCP or arrange E-visit).  The importance of social distancing was discussed today.  Time:   Today, I have spent 11 minutes with the patient with telehealth technology discussing the above problems.     Medication Adjustments/Labs and Tests Ordered: Current medicines are reviewed at length with the patient today.  Concerns regarding medicines are outlined above.   Tests Ordered: No orders of the defined types were placed in this encounter.   Medication Changes: Meds ordered this encounter  Medications  . nitroGLYCERIN (NITROSTAT) 0.4 MG SL tablet    Sig: Place 1 tablet (0.4 mg total) under the tongue every 5 (five)  minutes x 3 doses as needed for chest pain.    Dispense:  25 tablet    Refill:  12    Order Specific Question:   Supervising Provider    Answer:   Lelon Perla [[id number]]    Follow Up:  Virtual Visit or In  Person in 6 month(s)  Signed, Richardson Dopp, PA-C  06/08/2019 5:13 PM    [facility]

## 2019-06-08 ENCOUNTER — Telehealth (INDEPENDENT_AMBULATORY_CARE_PROVIDER_SITE_OTHER): Payer: Medicare HMO | Admitting: Physician Assistant

## 2019-06-08 ENCOUNTER — Other Ambulatory Visit: Payer: Self-pay

## 2019-06-08 ENCOUNTER — Encounter: Payer: Self-pay | Admitting: Physician Assistant

## 2019-06-08 VITALS — BP 138/79 | HR 70 | Ht 67.0 in | Wt 215.0 lb

## 2019-06-08 DIAGNOSIS — I2510 Atherosclerotic heart disease of native coronary artery without angina pectoris: Secondary | ICD-10-CM | POA: Diagnosis not present

## 2019-06-08 DIAGNOSIS — Z7189 Other specified counseling: Secondary | ICD-10-CM

## 2019-06-08 DIAGNOSIS — E785 Hyperlipidemia, unspecified: Secondary | ICD-10-CM

## 2019-06-08 DIAGNOSIS — I10 Essential (primary) hypertension: Secondary | ICD-10-CM

## 2019-06-08 MED ORDER — NITROGLYCERIN 0.4 MG SL SUBL: 0.4000 mg | SUBLINGUAL_TABLET | SUBLINGUAL | 12 refills | Status: DC | PRN

## 2019-06-08 NOTE — Patient Instructions (Signed)
Medication Instructions:  Your physician recommends that you continue on your current medications as directed. Please refer to the Current Medication list given to you today.  If you need a refill on your cardiac medications before your next appointment, please call your pharmacy.   Lab work: None  If you have labs (blood work) drawn today and your tests are completely normal, you will receive your results only by: . MyChart Message (if you have MyChart) OR . A paper copy in the mail If you have any lab test that is abnormal or we need to change your treatment, we will call you to review the results.  Testing/Procedures: None  Follow-Up: At [facility], you and your health needs are our priority.  As part of our continuing mission to provide you with exceptional heart care, we have created designated Provider Care Teams.  These Care Teams include your primary Cardiologist (physician) and Advanced Practice Providers (APPs -  Physician Assistants and Nurse Practitioners) who all work together to provide you with the care you need, when you need it. You will need a follow up appointment in:  6 months.  Please call our office 2 months in advance to schedule this appointment.  You may see Michael Cooper, MD or one of the following Advanced Practice Providers on your designated Care Team: Scott Weaver, PA-C Vin Bhagat, PA-C . Janine Hammond, NP  Any Other Special Instructions Will Be Listed Below (If Applicable).    

## 2019-06-09 DIAGNOSIS — R351 Nocturia: Secondary | ICD-10-CM | POA: Diagnosis not present

## 2019-06-09 DIAGNOSIS — N5201 Erectile dysfunction due to arterial insufficiency: Secondary | ICD-10-CM | POA: Diagnosis not present

## 2019-06-09 DIAGNOSIS — N200 Calculus of kidney: Secondary | ICD-10-CM | POA: Diagnosis not present

## 2019-06-09 DIAGNOSIS — N401 Enlarged prostate with lower urinary tract symptoms: Secondary | ICD-10-CM | POA: Diagnosis not present

## 2019-06-25 DIAGNOSIS — N401 Enlarged prostate with lower urinary tract symptoms: Secondary | ICD-10-CM | POA: Diagnosis not present

## 2019-06-25 DIAGNOSIS — E782 Mixed hyperlipidemia: Secondary | ICD-10-CM | POA: Diagnosis not present

## 2019-06-25 DIAGNOSIS — I2510 Atherosclerotic heart disease of native coronary artery without angina pectoris: Secondary | ICD-10-CM | POA: Diagnosis not present

## 2019-06-25 DIAGNOSIS — Z86010 Personal history of colonic polyps: Secondary | ICD-10-CM | POA: Diagnosis not present

## 2019-06-25 DIAGNOSIS — Z1159 Encounter for screening for other viral diseases: Secondary | ICD-10-CM | POA: Diagnosis not present

## 2019-06-25 DIAGNOSIS — K760 Fatty (change of) liver, not elsewhere classified: Secondary | ICD-10-CM | POA: Diagnosis not present

## 2019-06-25 DIAGNOSIS — R7301 Impaired fasting glucose: Secondary | ICD-10-CM | POA: Diagnosis not present

## 2019-06-25 DIAGNOSIS — Z8739 Personal history of other diseases of the musculoskeletal system and connective tissue: Secondary | ICD-10-CM | POA: Diagnosis not present

## 2019-06-25 DIAGNOSIS — Z0000 Encounter for general adult medical examination without abnormal findings: Secondary | ICD-10-CM | POA: Diagnosis not present

## 2019-06-25 DIAGNOSIS — G4733 Obstructive sleep apnea (adult) (pediatric): Secondary | ICD-10-CM | POA: Diagnosis not present

## 2019-07-01 DIAGNOSIS — J3089 Other allergic rhinitis: Secondary | ICD-10-CM | POA: Diagnosis not present

## 2019-07-01 DIAGNOSIS — J3081 Allergic rhinitis due to animal (cat) (dog) hair and dander: Secondary | ICD-10-CM | POA: Diagnosis not present

## 2019-07-01 DIAGNOSIS — J301 Allergic rhinitis due to pollen: Secondary | ICD-10-CM | POA: Diagnosis not present

## 2019-07-01 DIAGNOSIS — R05 Cough: Secondary | ICD-10-CM | POA: Diagnosis not present

## 2019-07-22 DIAGNOSIS — Z23 Encounter for immunization: Secondary | ICD-10-CM | POA: Diagnosis not present

## 2019-07-22 DIAGNOSIS — S61211A Laceration without foreign body of left index finger without damage to nail, initial encounter: Secondary | ICD-10-CM | POA: Diagnosis not present

## 2019-08-03 DIAGNOSIS — G4733 Obstructive sleep apnea (adult) (pediatric): Secondary | ICD-10-CM | POA: Diagnosis not present

## 2019-09-07 DIAGNOSIS — E782 Mixed hyperlipidemia: Secondary | ICD-10-CM | POA: Diagnosis not present

## 2019-09-07 DIAGNOSIS — N401 Enlarged prostate with lower urinary tract symptoms: Secondary | ICD-10-CM | POA: Diagnosis not present

## 2019-09-07 DIAGNOSIS — I2510 Atherosclerotic heart disease of native coronary artery without angina pectoris: Secondary | ICD-10-CM | POA: Diagnosis not present

## 2019-09-14 DIAGNOSIS — L570 Actinic keratosis: Secondary | ICD-10-CM | POA: Diagnosis not present

## 2019-09-14 DIAGNOSIS — Z86018 Personal history of other benign neoplasm: Secondary | ICD-10-CM | POA: Diagnosis not present

## 2019-09-14 DIAGNOSIS — L814 Other melanin hyperpigmentation: Secondary | ICD-10-CM | POA: Diagnosis not present

## 2019-09-14 DIAGNOSIS — L291 Pruritus scroti: Secondary | ICD-10-CM | POA: Diagnosis not present

## 2019-09-14 DIAGNOSIS — Z85828 Personal history of other malignant neoplasm of skin: Secondary | ICD-10-CM | POA: Diagnosis not present

## 2019-09-14 DIAGNOSIS — Z23 Encounter for immunization: Secondary | ICD-10-CM | POA: Diagnosis not present

## 2019-09-14 DIAGNOSIS — L821 Other seborrheic keratosis: Secondary | ICD-10-CM | POA: Diagnosis not present

## 2019-09-14 DIAGNOSIS — D225 Melanocytic nevi of trunk: Secondary | ICD-10-CM | POA: Diagnosis not present

## 2019-09-14 DIAGNOSIS — L219 Seborrheic dermatitis, unspecified: Secondary | ICD-10-CM | POA: Diagnosis not present

## 2019-10-22 IMAGING — CT CT ANGIO CHEST
2 of 6 series · 18 of 36 positions shown · IV contrast (Omni 300)
Comparison: None

CLINICAL DATA: Increased shortness of breath and dizziness for 3
days, high pretest probability for pulmonary embolism, history
hypertension, coronary artery disease post STEMI, irritable bowel
syndrome, gout

EXAM:
CT ANGIOGRAPHY CHEST WITH CONTRAST
TECHNIQUE: Multidetector CT imaging of the chest was performed using the
standard protocol during bolus administration of intravenous
contrast. Multiplanar CT image reconstructions and MIPs were
obtained to evaluate the vascular anatomy.
CONTRAST:  100mL 2T28ML-DM7 IOPAMIDOL (2T28ML-DM7) INJECTION 76% IV

[Series 7: pe thins · axial · 0.77mm/px · z∈[+1727,+1999]mm · 17 of 308 slices shown]
[im 18/308  lung]
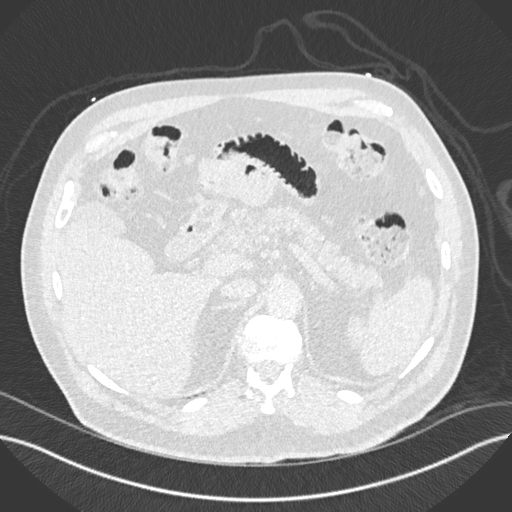
[im 35/308  mediastinal]
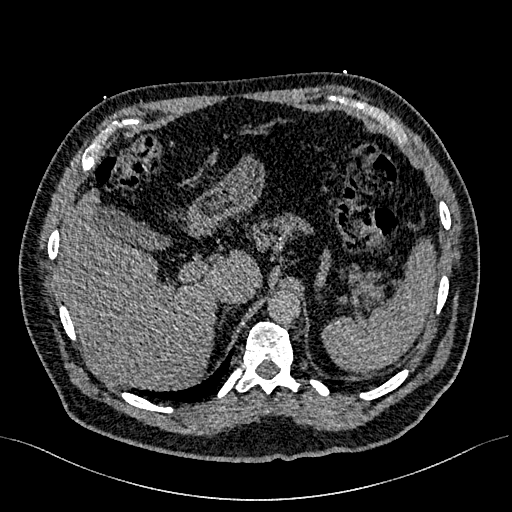
[im 52/308  lung]
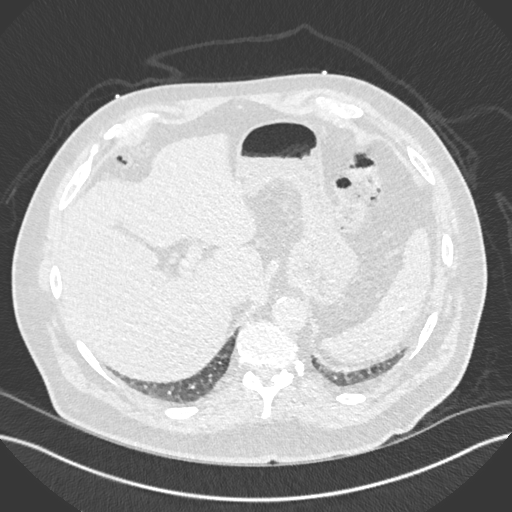
[im 69/308  mediastinal]
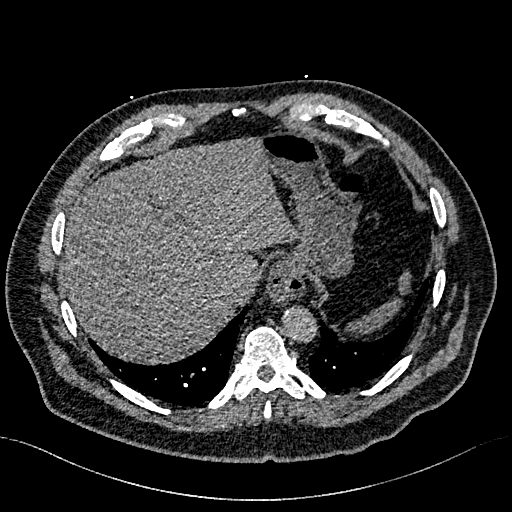
[im 86/308  lung]
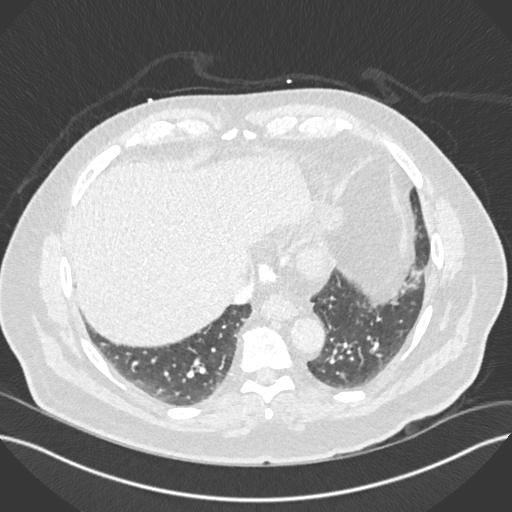
[im 103/308  mediastinal]
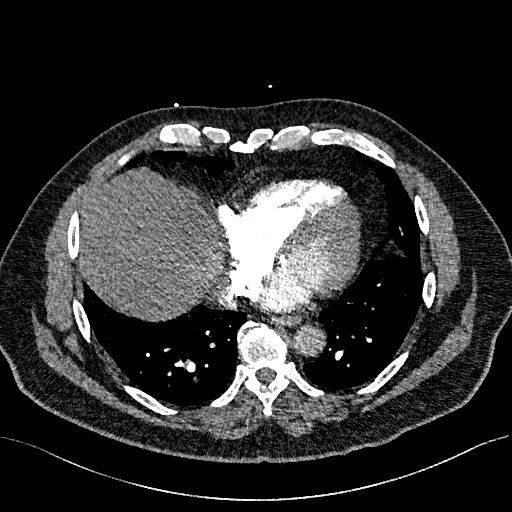
[im 120/308  lung]
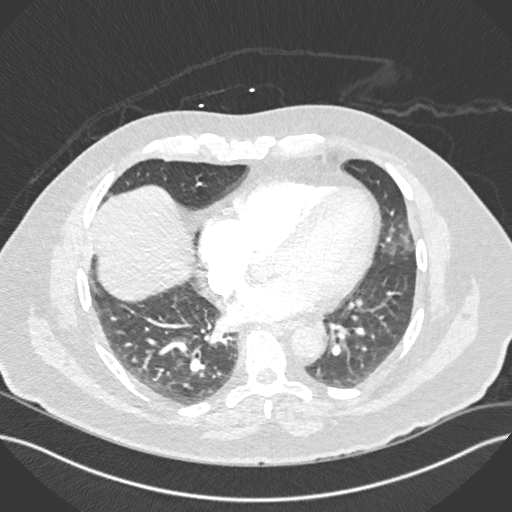
[im 137/308  mediastinal]
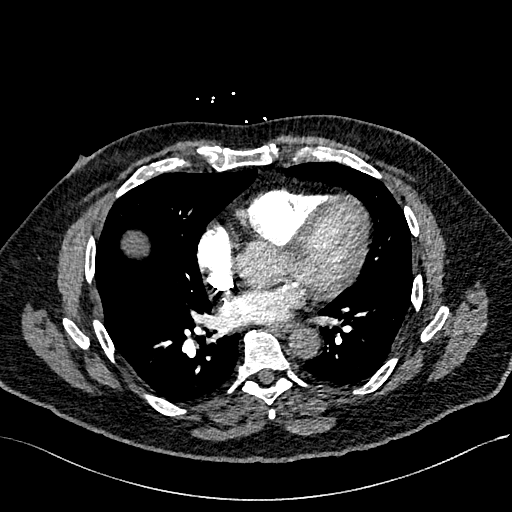
[im 154/308  lung]
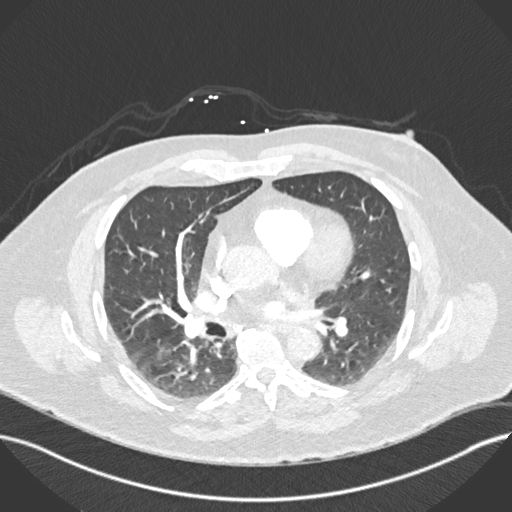
[im 171/308  mediastinal]
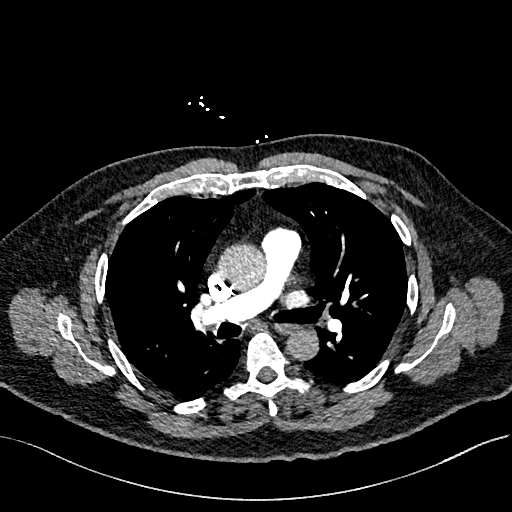
[im 188/308  lung]
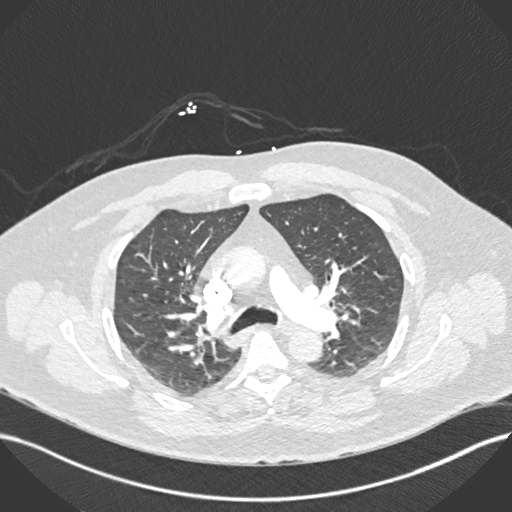
[im 205/308  mediastinal]
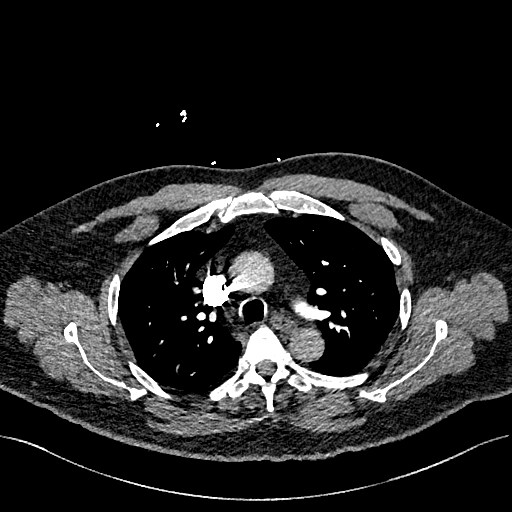
[im 222/308  lung]
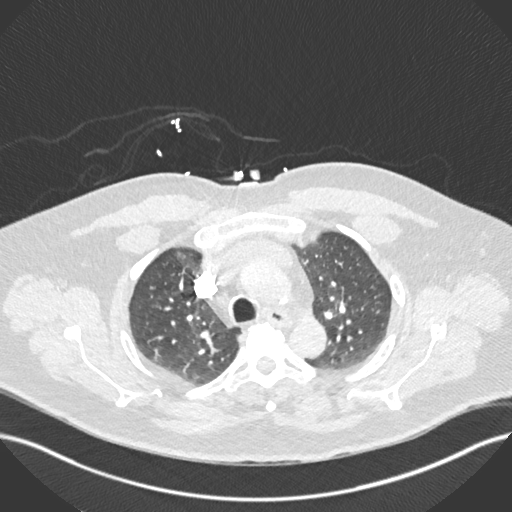
[im 239/308  mediastinal]
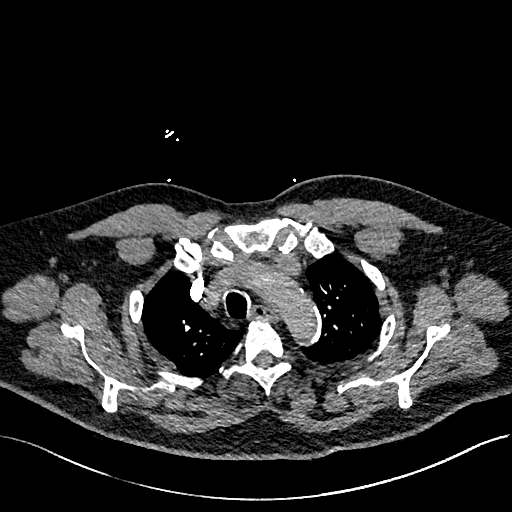
[im 256/308  lung]
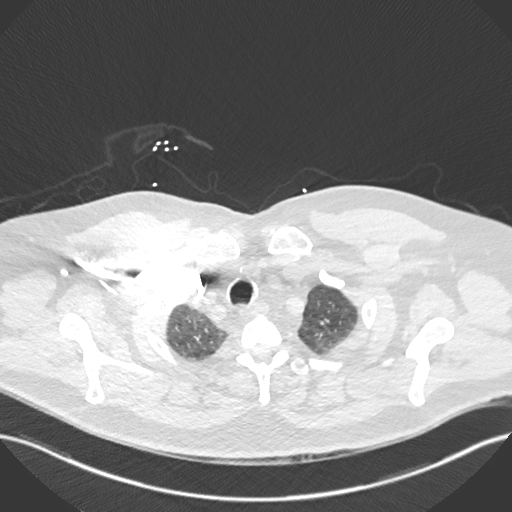
[im 273/308  mediastinal]
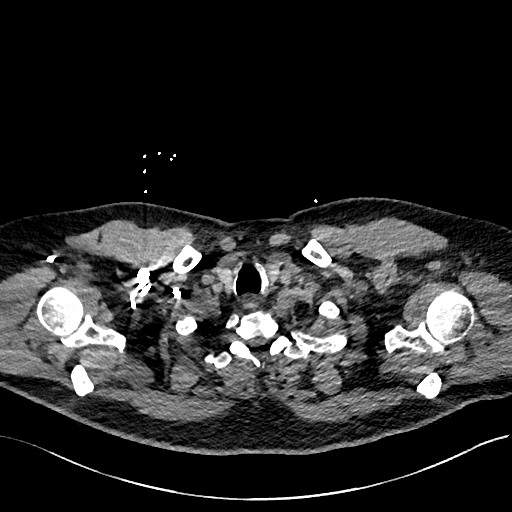
[im 290/308  lung]
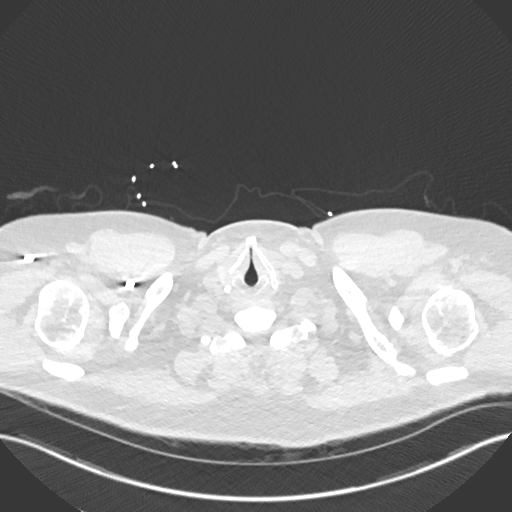

[Series 8: pe 2mm cor · coronal · 0.57mm/px · 1 of 151 slices shown]
[im 76/151  mediastinal]
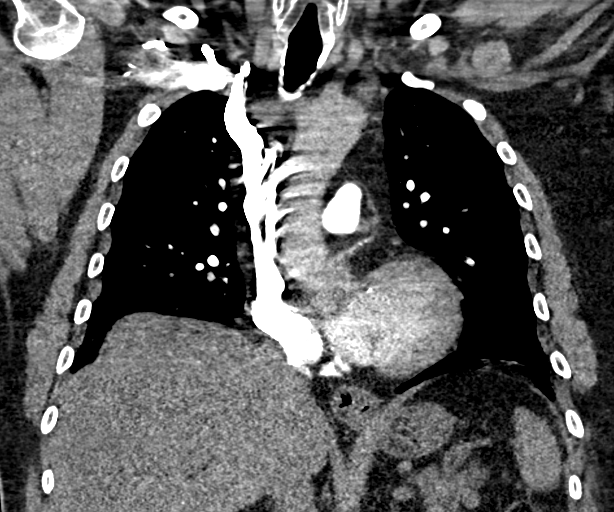

[18 of 36 positions shown; findings below may reference images not displayed]

FINDINGS: Cardiovascular: Scattered atherosclerotic calcifications aorta,
proximal great vessels and coronary arteries. Aorta normal caliber
without aneurysm. Pulmonary arteries well opacified and patent. No
evidence of pulmonary embolism. No pericardial effusion.

Mediastinum/Nodes: Small hiatal hernia. Esophagus unremarkable. Base
of cervical region normal appearance. No thoracic adenopathy. Tiny
calcified mediastinal lymph nodes noted.

Lungs/Pleura: Peribronchial thickening. No acute infiltrate, pleural
effusion or pneumothorax.

Upper Abdomen: Visualized upper abdomen unremarkable

Musculoskeletal: Unremarkable

Review of the MIP images confirms the above findings.
IMPRESSION: No evidence of pulmonary embolism.

Peribronchial thickening without infiltrate.

Scattered atherosclerotic disease changes including coronary
arteries.

Small hiatal hernia.

Aortic Atherosclerosis ([id number]).

## 2019-10-26 DIAGNOSIS — R69 Illness, unspecified: Secondary | ICD-10-CM | POA: Diagnosis not present

## 2019-10-28 DIAGNOSIS — I2510 Atherosclerotic heart disease of native coronary artery without angina pectoris: Secondary | ICD-10-CM | POA: Diagnosis not present

## 2019-10-28 DIAGNOSIS — N401 Enlarged prostate with lower urinary tract symptoms: Secondary | ICD-10-CM | POA: Diagnosis not present

## 2019-10-28 DIAGNOSIS — E782 Mixed hyperlipidemia: Secondary | ICD-10-CM | POA: Diagnosis not present

## 2019-11-23 DIAGNOSIS — H5203 Hypermetropia, bilateral: Secondary | ICD-10-CM | POA: Diagnosis not present

## 2019-12-07 DIAGNOSIS — I2510 Atherosclerotic heart disease of native coronary artery without angina pectoris: Secondary | ICD-10-CM | POA: Diagnosis not present

## 2019-12-07 DIAGNOSIS — E782 Mixed hyperlipidemia: Secondary | ICD-10-CM | POA: Diagnosis not present

## 2019-12-07 DIAGNOSIS — N401 Enlarged prostate with lower urinary tract symptoms: Secondary | ICD-10-CM | POA: Diagnosis not present

## 2020-02-02 DIAGNOSIS — Z006 Encounter for examination for normal comparison and control in clinical research program: Secondary | ICD-10-CM

## 2020-02-02 NOTE — Research (Signed)
Optimize 2 year follow up-Telephone  OPTIMIZE Research study 2 year telephone follow up completed. Patient doing well at this time, no adverse events since last visit. Next research visit will be a call in one year.

## 2020-02-17 DIAGNOSIS — N401 Enlarged prostate with lower urinary tract symptoms: Secondary | ICD-10-CM | POA: Diagnosis not present

## 2020-02-17 DIAGNOSIS — I2510 Atherosclerotic heart disease of native coronary artery without angina pectoris: Secondary | ICD-10-CM | POA: Diagnosis not present

## 2020-02-17 DIAGNOSIS — E782 Mixed hyperlipidemia: Secondary | ICD-10-CM | POA: Diagnosis not present

## 2020-03-23 DIAGNOSIS — N401 Enlarged prostate with lower urinary tract symptoms: Secondary | ICD-10-CM | POA: Diagnosis not present

## 2020-03-23 DIAGNOSIS — I2510 Atherosclerotic heart disease of native coronary artery without angina pectoris: Secondary | ICD-10-CM | POA: Diagnosis not present

## 2020-03-23 DIAGNOSIS — E782 Mixed hyperlipidemia: Secondary | ICD-10-CM | POA: Diagnosis not present

## 2020-04-04 DIAGNOSIS — R69 Illness, unspecified: Secondary | ICD-10-CM | POA: Diagnosis not present

## 2020-04-12 DIAGNOSIS — G4733 Obstructive sleep apnea (adult) (pediatric): Secondary | ICD-10-CM | POA: Diagnosis not present

## 2020-05-17 DIAGNOSIS — N401 Enlarged prostate with lower urinary tract symptoms: Secondary | ICD-10-CM | POA: Diagnosis not present

## 2020-05-17 DIAGNOSIS — I2510 Atherosclerotic heart disease of native coronary artery without angina pectoris: Secondary | ICD-10-CM | POA: Diagnosis not present

## 2020-05-17 DIAGNOSIS — E782 Mixed hyperlipidemia: Secondary | ICD-10-CM | POA: Diagnosis not present

## 2020-06-20 DIAGNOSIS — N5201 Erectile dysfunction due to arterial insufficiency: Secondary | ICD-10-CM | POA: Diagnosis not present

## 2020-06-20 DIAGNOSIS — N200 Calculus of kidney: Secondary | ICD-10-CM | POA: Diagnosis not present

## 2020-06-20 DIAGNOSIS — N400 Enlarged prostate without lower urinary tract symptoms: Secondary | ICD-10-CM | POA: Diagnosis not present

## 2020-07-13 DIAGNOSIS — G4733 Obstructive sleep apnea (adult) (pediatric): Secondary | ICD-10-CM | POA: Diagnosis not present

## 2020-07-13 DIAGNOSIS — N529 Male erectile dysfunction, unspecified: Secondary | ICD-10-CM | POA: Diagnosis not present

## 2020-07-13 DIAGNOSIS — E782 Mixed hyperlipidemia: Secondary | ICD-10-CM | POA: Diagnosis not present

## 2020-07-13 DIAGNOSIS — K760 Fatty (change of) liver, not elsewhere classified: Secondary | ICD-10-CM | POA: Diagnosis not present

## 2020-07-13 DIAGNOSIS — Z789 Other specified health status: Secondary | ICD-10-CM | POA: Diagnosis not present

## 2020-07-13 DIAGNOSIS — Z87442 Personal history of urinary calculi: Secondary | ICD-10-CM | POA: Diagnosis not present

## 2020-07-13 DIAGNOSIS — Z0000 Encounter for general adult medical examination without abnormal findings: Secondary | ICD-10-CM | POA: Diagnosis not present

## 2020-07-13 DIAGNOSIS — I2510 Atherosclerotic heart disease of native coronary artery without angina pectoris: Secondary | ICD-10-CM | POA: Diagnosis not present

## 2020-07-13 DIAGNOSIS — R7301 Impaired fasting glucose: Secondary | ICD-10-CM | POA: Diagnosis not present

## 2020-07-20 ENCOUNTER — Ambulatory Visit: Payer: Medicare HMO | Admitting: Cardiovascular Disease

## 2020-07-20 ENCOUNTER — Encounter: Payer: Self-pay | Admitting: Cardiovascular Disease

## 2020-07-20 ENCOUNTER — Other Ambulatory Visit: Payer: Self-pay

## 2020-07-20 VITALS — BP 128/70 | HR 77 | Ht 67.0 in | Wt 222.0 lb

## 2020-07-20 DIAGNOSIS — I2510 Atherosclerotic heart disease of native coronary artery without angina pectoris: Secondary | ICD-10-CM

## 2020-07-20 DIAGNOSIS — E782 Mixed hyperlipidemia: Secondary | ICD-10-CM

## 2020-07-20 NOTE — Patient Instructions (Signed)
Medication Instructions:  Your provider recommends that you continue on your current medications as directed. Please refer to the Current Medication list given to you today.   *If you need a refill on your cardiac medications before your next appointment, please call your pharmacy*   Follow-Up: You have been referred to the [facility].  At [facility], you and your health needs are our priority.  As part of our continuing mission to provide you with exceptional heart care, we have created designated Provider Care Teams.  These Care Teams include your primary Cardiologist (physician) and Advanced Practice Providers (APPs -  Physician Assistants and Nurse Practitioners) who all work together to provide you with the care you need, when you need it. Your next appointment:   12 month(s) The format for your next appointment:   In Person Provider:   You may see Michael Cooper, MD or one of the following Advanced Practice Providers on your designated Care Team:    Scott Weaver, PA-C  Vin Bhagat, PA-C   

## 2020-07-20 NOTE — Progress Notes (Signed)
Cardiology Office Note:    Date:  07/21/2020   ID:  Timothy, Wilcox [birth year], MRN [id number]  PCP:  Hulan Fess, MD  [facility] Cardiologist:  Sherren Mocha, MD  [facility] Electrophysiologist:  None   Referring MD: Hulan Fess, MD   Chief Complaint  Patient presents with   Shortness of Breath    History of Present Illness:    Timothy Wilcox is a 79 y.o. male with a hx of coronary artery disease and chronic shortness of breath, presenting for follow-up evaluation today.  He initially presented with an inferior STEMI in 2018 and was treated with a drug-eluting stent in the right coronary artery. He could not tolerate ticagrelor because of shortness of breath. He was switched to clopidogrel with a good response. He then presented in 2019 with progressive dyspnea. He underwent repeat cardiac catheterization as this was his presenting symptom before. This demonstrated patency of his stent site but progressive and severe stenosis of the left circumflex. He underwent uncomplicated stenting and continued on clopidogrel.  He continued to have symptoms of shortness of breath. He underwent extensive evaluation including VQ scan, CT pulmonary embolism study, echocardiography, and cardiopulmonary exercise testing.  He also underwent formal pulmonary evaluation.  No clear cause of his dyspnea was found and a diet and exercise program was recommended.  The patient is here alone today.  He is doing fairly well.  He really noticed significant improvement in his breathing after he discontinued clopidogrel.  He thinks the antiplatelet therapy was because of his dramatic shortness of breath.  He has been doing better since that time.  He has had no recurrent chest pain or pressure.  No orthopnea, PND, or leg swelling.  Past Medical History:  Diagnosis Date   Arthritis    CAD (coronary artery disease) 02/26/2018   A. Inf STEMI 6/18: LHC - pLCx 60, dRCA 100, EF 45-50 >> PCI:   DES to RCA // B. Echo 8/18: EF 60-65, nwm, grade 1 dd, MAC. C. 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   Hyperlipemia    intol to some statins   IBS (irritable bowel syndrome)    Medication intolerance    severe SOB due to Brilinta >> changed to Plavix   Renal disorder    kidney stones   Seasonal allergies    Sinus congestion    chronic   Sleep apnea    uses a c-pap    Past Surgical History:  Procedure Laterality Date   COLONOSCOPY     CORONARY STENT INTERVENTION N/A 04/03/2017   Procedure: Coronary Stent Intervention;  Surgeon: Sherren Mocha, MD;  Location: [place] [facility];  Service: Cardiovascular;  Laterality: N/A;   CORONARY STENT INTERVENTION N/A 02/27/2018   Procedure: CORONARY STENT INTERVENTION;  Surgeon: Troy Sine, MD;  Location: [place] [facility];  Service: Cardiovascular;  Laterality: N/A;   INCISION AND DRAINAGE  2011   infected finger   LEFT HEART CATH AND CORONARY ANGIOGRAPHY N/A 04/03/2017   Procedure: Left Heart Cath and Coronary Angiography;  Surgeon: Sherren Mocha, MD;  Location: [place] [facility];  Service: Cardiovascular;  Laterality: N/A;   LEFT HEART CATH AND CORONARY ANGIOGRAPHY N/A 02/27/2018   Procedure: LEFT HEART CATH AND CORONARY ANGIOGRAPHY;  Surgeon: Troy Sine, MD;  Location: [place] [facility];  Service: Cardiovascular;  Laterality: N/A;   SHOULDER ARTHROSCOPY WITH ROTATOR CUFF REPAIR  AND SUBACROMIAL DECOMPRESSION Right 01/29/2013   Procedure: RIGHT SHOULDER ARTHROSCOPY WITH SUBACROMIAL DECOMPRESSION, THREE TENDON ROTATOR CUFF REPAIR;  Surgeon: Cammie Sickle., MD;  Location: [facility];  Service: Orthopedics;  Laterality: Right;   TOE DEBRIDEMENT     rt toe cyst    Current Medications: Current Meds  Medication Sig   allopurinol (ZYLOPRIM) 300 MG tablet Take 300 mg by mouth daily.   aspirin EC 81 MG tablet Take 81 mg by mouth daily.    fluticasone (FLONASE) 50 MCG/ACT nasal spray Place 1 spray into both nostrils 2 (two) times daily.   hydrocortisone cream 1 % Apply 1 application topically 3 (three) times a week.   ibuprofen (ADVIL,MOTRIN) 200 MG tablet Take 200 mg by mouth every 6 (six) hours as needed.   ketoconazole (NIZORAL) 2 % cream daily.   loratadine (CLARITIN) 10 MG tablet Take 10 mg by mouth 2 (two) times daily.    Multiple Vitamin (MULTIVITAMIN WITH MINERALS) TABS Take 1 tablet by mouth daily.   nitroGLYCERIN (NITROSTAT) 0.4 MG SL tablet Place 1 tablet (0.4 mg total) under the tongue every 5 (five) minutes x 3 doses as needed for chest pain.   Probiotic Product (DAILY PROBIOTIC PO) Take 1 tablet by mouth daily.   sildenafil (VIAGRA) 100 MG tablet Take 100 mg by mouth daily as needed for erectile dysfunction.    Wheat Dextrin (BENEFIBER PO) Take 1 tablet by mouth daily.     Allergies:   Brilinta [ticagrelor], Statins, Dicyclomine, Plavix [clopidogrel], and Tagamet [cimetidine]   Social History   Socioeconomic History   Marital status: Married    Spouse name: Not on file   Number of children: Not on file   Years of education: Not on file   Highest education level: Not on file  Occupational History   Not on file  Tobacco Use   Smoking status: Never Smoker   Smokeless tobacco: Never Used  Vaping Use   Vaping Use: Never used  Substance and Sexual Activity   Alcohol use: Yes    Comment: a bottle of wine every night   Drug use: No   Sexual activity: Not on file  Other Topics Concern   Not on file  Social History Narrative   Not on file   Social Determinants of Health   Financial Resource Strain:    Difficulty of Paying Living Expenses: Not on file  Food Insecurity:    Worried About [place] in the Last Year: Not on file   Ran Out of Food in the Last Year: Not on file  Transportation Needs:    Lack of Transportation (Medical): Not on file   Lack of  Transportation (Non-Medical): Not on file  Physical Activity:    Days of Exercise per Week: Not on file   Minutes of Exercise per Session: Not on file  Stress:    Feeling of Stress : Not on file  Social Connections:    Frequency of Communication with Friends and Family: Not on file   Frequency of Social Gatherings with Friends and Family: Not on file   Attends Religious Services: Not on file   Active Member of Clubs or Organizations: Not on file   Attends Archivist Meetings: Not on file   Marital Status: Not on file     Family History: The patient's family history includes Pancreatic cancer in his mother; Skin cancer in his father and mother.  ROS:   Please see the  history of present illness.    All other systems reviewed and are negative.  EKGs/Labs/Other Studies Reviewed:    The following studies were reviewed today: Echo 03-19-2018: Study Conclusions   - Left ventricle: The cavity size was normal. Systolic function was  normal. The estimated ejection fraction was in the range of 55%  to 60%. Wall motion was normal; there were no regional wall  motion abnormalities. The study is not technically sufficient to  allow evaluation of LV diastolic function.  - Mitral valve: Calcified annulus. Systolic bowing without  prolapse.  - Atrial septum: No defect or patent foramen ovale was identified.  Echo contrast study showed no right-to-left atrial level shunt,  at baseline or with provocation. No late shunting is seen.   CPX Testing 04-09-2018: Agree with above. Normal functional capacity. At peak exercise patient limited by his ventilation. Suspect majority of his exercise intolerance due to obesity and related ventilatory limitation. However mildly elevated VE/VCO2 slope may indicate a component of diastolic dysfunction. Suggest weight loss and exercise program with retesting if symptoms persist/worsen.   EKG:  EKG is ordered today.  The ekg ordered  today demonstrates NSR 73 bpm, age-indeterminate inferior infarct  Recent Labs: No results found for requested labs within last 8760 hours.  Recent Lipid Panel    Component Value Date/Time   CHOL 148 02/27/2018 0220   CHOL 165 07/15/2017 1003   TRIG 208 (H) 02/27/2018 0220   HDL 45 02/27/2018 0220   HDL 63 07/15/2017 1003   CHOLHDL 3.3 02/27/2018 0220   VLDL 42 (H) 02/27/2018 0220   LDLCALC 61 02/27/2018 0220   LDLCALC 55 07/15/2017 1003     Risk Assessment/Calculations:       Physical Exam:    VS:  BP 128/70    Pulse 77    Ht _0  (1.702 m)    Wt 222 lb (100.7 kg)    SpO2 98%    BMI 34.77 kg/m     Wt Readings from Last 3 Encounters:  07/20/20 222 lb (100.7 kg)  06/08/19 215 lb (97.5 kg)  12/03/18 226 lb 1.9 oz (102.6 kg)     GEN:  Well nourished, well developed in no acute distress HEENT: Normal NECK: No JVD; No carotid bruits LYMPHATICS: No lymphadenopathy CARDIAC: RRR, no murmurs, rubs, gallops RESPIRATORY:  Clear to auscultation without rales, wheezing or rhonchi  ABDOMEN: Soft, non-tender, non-distended MUSCULOSKELETAL:  No edema; No deformity  SKIN: Warm and dry NEUROLOGIC:  Alert and oriented x 3 PSYCHIATRIC:  Normal affect   ASSESSMENT:    1. Mixed hyperlipidemia   2. Coronary artery disease involving native coronary artery of native heart without angina pectoris    PLAN:    In order of problems listed above:  1. Lipids reviewed with an LDL of 87, HDL 58, total cholesterol 205, triglycerides 298.  The patient is unable to take any statin drugs.  He has had severe myalgias in the past.  We discussed diet and lifestyle modification today.  We discussed lipid clinic referral for consideration of a PCSK9 inhibitor.  He is willing to explore this.  I think he would benefit significantly with his atherosclerotic vascular disease with a goal LDL cholesterol less than 70 mg/dL. 2. Continue aspirin for antiplatelet therapy.  Lipid clinic referral as  above.   Medication Adjustments/Labs and Tests Ordered: Current medicines are reviewed at length with the patient today.  Concerns regarding medicines are outlined above.  Orders Placed This Encounter  Procedures  AMB Referral to [facility] Pharm-D   EKG 12-Lead   No orders of the defined types were placed in this encounter.   Patient Instructions  Medication Instructions:  Your provider recommends that you continue on your current medications as directed. Please refer to the Current Medication list given to you today.   *If you need a refill on your cardiac medications before your next appointment, please call your pharmacy*  Follow-Up: You have been referred to the [place].  At [facility], you and your health needs are our priority.  As part of our continuing mission to provide you with exceptional heart care, we have created designated Provider Care Teams.  These Care Teams include your primary Cardiologist (physician) and Advanced Practice Providers (APPs -  Physician Assistants and Nurse Practitioners) who all work together to provide you with the care you need, when you need it. Your next appointment:   12 month(s) The format for your next appointment:   In Person Provider:   You may see Sherren Mocha, MD or one of the following Advanced Practice Providers on your designated Care Team:    Richardson Dopp, PA-C  Robbie Lis, [place]      Signed, Sherren Mocha, MD  07/21/2020 3:14 PM    [place]

## 2020-07-21 ENCOUNTER — Encounter: Payer: Self-pay | Admitting: Cardiovascular Disease

## 2020-07-25 NOTE — Progress Notes (Addendum)
Patient ID: Timothy Wilcox                 DOB: 04-08-1941                    MRN: [id number]     HPI: Timothy Wilcox is a 79 y.o. male patient referred to lipid clinic by Dr. Burt Knack. PMH is significant for CAD s/p STEMI (2018) with DES to the distal RCA and to proximal circumflex; angina, PCI stenting in 2019 due to severe stenosis of the left circumflex, HLD, and sleep apnea.   Patient presents today in good spirits. Reports currently not on any lipid-lowering agent for >10 years. Reports previously on simvastatin for 10 days which caused "bad knee pain" and has not tried any other statin before. Reports history of knee pain that would flare occasionally even after stopping simvastatin. Additionally, patient has a physical next week with PCP that will include a lipid panel check.  Current Medications: none Intolerances: simvastatin 79m daily in 2005 - pain in joints, couldn't move knees Risk Factors: CAD s/p STEMI, PCI d/t severe stenosis, HLD LDL goal: <55 mg/dL  Diet: Eat 3 meals/day Breakfast: cheese, eggs, limits bread, bacon  Lunch: soup, sandwiches Dinner: limits fried food, fish, chicken, shrimp, steak/hamburgers Snacks: nuts, peanut butter Drinks: diet sodas, 4 glasses of wine (5-6x/week)  Exercise: Floor stretches 15 mins 6x/week and aerobic exercising (15 mins of hardworking and 15 mins on machine) on 3x/weeks  Family History: No known family history of cardiovascular disease.   Social History: denies tobacco use, 4 glasses of wine (5-6x/week) Labs: 06/25/19: LDL 87, TG 298, TC 205, HDL 58 (none) 02/27/18: LDL 61, TG 208, TC 148, HDL 45 (none)  Past Medical History:  Diagnosis Date  . Arthritis   . CAD (coronary artery disease) 02/26/2018   A. Inf STEMI 6/18: LHC - pLCx 60, dRCA 100, EF 45-50 >> PCI:  DES to RCA // B. Echo 8/18: EF 60-65, nwm, grade 1 dd, MAC. C. LHD DES to circ, patnet RCA stent  . Gout   . 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  Hyperlipemia    intol to some statins  . IBS (irritable bowel syndrome)   . Medication intolerance    severe SOB due to Brilinta >> changed to Plavix  . Renal disorder    kidney stones  . Seasonal allergies   . Sinus congestion    chronic  . Sleep apnea    uses a c-pap    Current Outpatient Medications on File Prior to Visit  Medication Sig Dispense Refill  . allopurinol (ZYLOPRIM) 300 MG tablet Take 300 mg by mouth daily.    .Marland Kitchenaspirin EC 81 MG tablet Take 81 mg by mouth daily.    . fluticasone (FLONASE) 50 MCG/ACT nasal spray Place 1 spray into both nostrils 2 (two) times daily.    . hydrocortisone cream 1 % Apply 1 application topically 3 (three) times a week.    .Marland Kitchenibuprofen (ADVIL,MOTRIN) 200 MG tablet Take 200 mg by mouth every 6 (six) hours as needed.    .Marland Kitchenketoconazole (NIZORAL) 2 % cream daily.    .Marland Kitchenloratadine (CLARITIN) 10 MG tablet Take 10 mg by mouth 2 (two) times daily.     . Multiple Vitamin (MULTIVITAMIN WITH MINERALS) TABS Take 1 tablet by mouth daily.    . nitroGLYCERIN (NITROSTAT) 0.4 MG SL tablet Place 1 tablet (0.4 mg total) under the tongue every 5 (  five) minutes x 3 doses as needed for chest pain. 25 tablet 12  . Probiotic Product (DAILY PROBIOTIC PO) Take 1 tablet by mouth daily.    . sildenafil (VIAGRA) 100 MG tablet Take 100 mg by mouth daily as needed for erectile dysfunction.     . Wheat Dextrin (BENEFIBER PO) Take 1 tablet by mouth daily.     No current facility-administered medications on file prior to visit.    Allergies  Allergen Reactions  . Brilinta [Ticagrelor] Shortness Of Breath and Other (See Comments)    Dizziness, weakness/Caused hospital admission  . Statins Other (See Comments)    Pain in joints, cant move knees  . Dicyclomine Other (See Comments)    Interfered with sleep,nervous  . Plavix [Clopidogrel]     SHORT OF BREATH, FATIQUE  . Tagamet [Cimetidine] Other (See Comments)    Gynecomastia     Assessment/Plan:  1. Hyperlipidemia  - LDL not at goal <55 mg/dL due to progressive heart disease (STEMI in 2018 and then severe stenosis in 2019 requiring additional stenting). Patient is not on lipid-lowering therapy and experienced knee pain with simvastatin. Discussed initiation of a PCSK9-inhibitor, ezetimibe, or rosuvastatin. Patient is amendable to trying low-dose rosuvastatin 5 mg daily and will titrate to maximum tolerable dose. If pt does not tolerate rosuvastatin, can discuss trial of ezetimibe or PCSK9i therapy. Discussed healthy eating options such asa diet full of vegetables, fruit and lean meats (chicken, [place], fish)andto limit salt, carbs (bread, pasta, sugar, rice), and red meat consumption.Will schedule fasting lipid panel and LFTs in 3 months. Additionally, requested patient to provide lipid panel results once received from primary care office.  Lorel Monaco, PharmD PGY2 [place] [address], [place], [place] [address] Phone: [phone]; Fax: [phone]

## 2020-07-26 ENCOUNTER — Other Ambulatory Visit: Payer: Self-pay

## 2020-07-26 ENCOUNTER — Ambulatory Visit (INDEPENDENT_AMBULATORY_CARE_PROVIDER_SITE_OTHER): Payer: Medicare HMO | Admitting: Pharmacist

## 2020-07-26 DIAGNOSIS — T466X5A Adverse effect of antihyperlipidemic and antiarteriosclerotic drugs, initial encounter: Secondary | ICD-10-CM | POA: Diagnosis not present

## 2020-07-26 DIAGNOSIS — E785 Hyperlipidemia, unspecified: Secondary | ICD-10-CM

## 2020-07-26 DIAGNOSIS — G720 Drug-induced myopathy: Secondary | ICD-10-CM | POA: Diagnosis not present

## 2020-07-26 MED ORDER — ROSUVASTATIN CALCIUM 5 MG PO TABS: 5.0000 mg | ORAL_TABLET | Freq: Every day | ORAL | 3 refills | Status: DC

## 2020-07-26 NOTE — Patient Instructions (Addendum)
Nice to see you today!  Keep up the good work with diet and exercise. Aim for a diet full of vegetables, fruit and lean meats (chicken, [place], fish). Try to limit carbs (bread, pasta, sugar, rice) and red meat consumption.  Your goal LDL is <55 mg/dL  Medication Changes: Begin taking Rosuvastatin 5 mg daily  Please give [place] a call at [phone] with any questions or concerns.   More information about the cholesterol injection/shot called Praluent or Repatha.

## 2020-07-27 ENCOUNTER — Ambulatory Visit: Payer: Medicare HMO

## 2020-07-27 DIAGNOSIS — C44311 Basal cell carcinoma of skin of nose: Secondary | ICD-10-CM | POA: Diagnosis not present

## 2020-07-27 DIAGNOSIS — L309 Dermatitis, unspecified: Secondary | ICD-10-CM | POA: Diagnosis not present

## 2020-07-27 DIAGNOSIS — D225 Melanocytic nevi of trunk: Secondary | ICD-10-CM | POA: Diagnosis not present

## 2020-07-27 DIAGNOSIS — D2272 Melanocytic nevi of left lower limb, including hip: Secondary | ICD-10-CM | POA: Diagnosis not present

## 2020-07-27 DIAGNOSIS — C44319 Basal cell carcinoma of skin of other parts of face: Secondary | ICD-10-CM | POA: Diagnosis not present

## 2020-07-27 DIAGNOSIS — L218 Other seborrheic dermatitis: Secondary | ICD-10-CM | POA: Diagnosis not present

## 2020-07-27 DIAGNOSIS — L570 Actinic keratosis: Secondary | ICD-10-CM | POA: Diagnosis not present

## 2020-07-27 DIAGNOSIS — L814 Other melanin hyperpigmentation: Secondary | ICD-10-CM | POA: Diagnosis not present

## 2020-07-27 DIAGNOSIS — L820 Inflamed seborrheic keratosis: Secondary | ICD-10-CM | POA: Diagnosis not present

## 2020-07-27 DIAGNOSIS — L821 Other seborrheic keratosis: Secondary | ICD-10-CM | POA: Diagnosis not present

## 2020-07-27 DIAGNOSIS — D2271 Melanocytic nevi of right lower limb, including hip: Secondary | ICD-10-CM | POA: Diagnosis not present

## 2020-07-28 DIAGNOSIS — I2510 Atherosclerotic heart disease of native coronary artery without angina pectoris: Secondary | ICD-10-CM | POA: Diagnosis not present

## 2020-07-28 DIAGNOSIS — N529 Male erectile dysfunction, unspecified: Secondary | ICD-10-CM | POA: Diagnosis not present

## 2020-07-28 DIAGNOSIS — Z0000 Encounter for general adult medical examination without abnormal findings: Secondary | ICD-10-CM | POA: Diagnosis not present

## 2020-07-28 DIAGNOSIS — E782 Mixed hyperlipidemia: Secondary | ICD-10-CM | POA: Diagnosis not present

## 2020-07-28 DIAGNOSIS — R7301 Impaired fasting glucose: Secondary | ICD-10-CM | POA: Diagnosis not present

## 2020-07-28 DIAGNOSIS — Z87442 Personal history of urinary calculi: Secondary | ICD-10-CM | POA: Diagnosis not present

## 2020-07-28 DIAGNOSIS — Z789 Other specified health status: Secondary | ICD-10-CM | POA: Diagnosis not present

## 2020-07-28 DIAGNOSIS — G4733 Obstructive sleep apnea (adult) (pediatric): Secondary | ICD-10-CM | POA: Diagnosis not present

## 2020-07-28 DIAGNOSIS — K760 Fatty (change of) liver, not elsewhere classified: Secondary | ICD-10-CM | POA: Diagnosis not present

## 2020-08-01 DIAGNOSIS — G4733 Obstructive sleep apnea (adult) (pediatric): Secondary | ICD-10-CM | POA: Diagnosis not present

## 2020-08-03 DIAGNOSIS — R69 Illness, unspecified: Secondary | ICD-10-CM | POA: Diagnosis not present

## 2020-08-05 DIAGNOSIS — E782 Mixed hyperlipidemia: Secondary | ICD-10-CM | POA: Diagnosis not present

## 2020-08-05 DIAGNOSIS — I2510 Atherosclerotic heart disease of native coronary artery without angina pectoris: Secondary | ICD-10-CM | POA: Diagnosis not present

## 2020-08-05 DIAGNOSIS — N401 Enlarged prostate with lower urinary tract symptoms: Secondary | ICD-10-CM | POA: Diagnosis not present

## 2020-08-30 DIAGNOSIS — C44311 Basal cell carcinoma of skin of nose: Secondary | ICD-10-CM | POA: Diagnosis not present

## 2020-08-30 DIAGNOSIS — Z85828 Personal history of other malignant neoplasm of skin: Secondary | ICD-10-CM | POA: Diagnosis not present

## 2020-09-06 DIAGNOSIS — L309 Dermatitis, unspecified: Secondary | ICD-10-CM | POA: Diagnosis not present

## 2020-09-06 DIAGNOSIS — C44319 Basal cell carcinoma of skin of other parts of face: Secondary | ICD-10-CM | POA: Diagnosis not present

## 2020-09-06 DIAGNOSIS — Z85828 Personal history of other malignant neoplasm of skin: Secondary | ICD-10-CM | POA: Diagnosis not present

## 2020-09-19 DIAGNOSIS — E782 Mixed hyperlipidemia: Secondary | ICD-10-CM | POA: Diagnosis not present

## 2020-09-19 DIAGNOSIS — G8929 Other chronic pain: Secondary | ICD-10-CM | POA: Diagnosis not present

## 2020-09-19 DIAGNOSIS — I2510 Atherosclerotic heart disease of native coronary artery without angina pectoris: Secondary | ICD-10-CM | POA: Diagnosis not present

## 2020-09-19 DIAGNOSIS — N401 Enlarged prostate with lower urinary tract symptoms: Secondary | ICD-10-CM | POA: Diagnosis not present

## 2020-10-19 DIAGNOSIS — G8929 Other chronic pain: Secondary | ICD-10-CM | POA: Diagnosis not present

## 2020-10-19 DIAGNOSIS — E782 Mixed hyperlipidemia: Secondary | ICD-10-CM | POA: Diagnosis not present

## 2020-10-19 DIAGNOSIS — N401 Enlarged prostate with lower urinary tract symptoms: Secondary | ICD-10-CM | POA: Diagnosis not present

## 2020-10-19 DIAGNOSIS — I2510 Atherosclerotic heart disease of native coronary artery without angina pectoris: Secondary | ICD-10-CM | POA: Diagnosis not present

## 2020-10-26 ENCOUNTER — Other Ambulatory Visit: Payer: Self-pay

## 2020-10-26 ENCOUNTER — Other Ambulatory Visit: Payer: Medicare HMO | Admitting: *Deleted

## 2020-10-26 DIAGNOSIS — E785 Hyperlipidemia, unspecified: Secondary | ICD-10-CM | POA: Diagnosis not present

## 2020-10-27 LAB — LIPID PANEL
Chol/HDL Ratio: 1.8 ratio (ref 0.0–5.0)
Cholesterol, Total: 122 mg/dL (ref 100–199)
HDL: 66 mg/dL (ref >39)
LDL Chol Calc (NIH): 35 mg/dL (ref 0–99)
Triglycerides: 124 mg/dL (ref 0–149)
VLDL Cholesterol Cal: 21 mg/dL (ref 5–40)

## 2020-10-27 LAB — HEPATIC FUNCTION PANEL
ALT: 35 IU/L (ref 0–44)
AST: 23 IU/L (ref 0–40)
Albumin: 4.3 g/dL (ref 3.7–4.7)
Alkaline Phosphatase: 72 IU/L (ref 44–121)
Bilirubin Total: <0.2 mg/dL (ref 0.0–1.2)
Bilirubin, Direct: <0.1 mg/dL (ref 0.00–0.40)
Total Protein: 6.8 g/dL (ref 6.0–8.5)

## 2020-11-18 ENCOUNTER — Other Ambulatory Visit: Payer: Self-pay | Admitting: Cardiovascular Disease

## 2020-11-18 DIAGNOSIS — E785 Hyperlipidemia, unspecified: Secondary | ICD-10-CM

## 2020-12-01 DIAGNOSIS — H5203 Hypermetropia, bilateral: Secondary | ICD-10-CM | POA: Diagnosis not present

## 2020-12-18 DIAGNOSIS — E785 Hyperlipidemia, unspecified: Secondary | ICD-10-CM

## 2020-12-19 DIAGNOSIS — N401 Enlarged prostate with lower urinary tract symptoms: Secondary | ICD-10-CM | POA: Diagnosis not present

## 2020-12-19 DIAGNOSIS — E782 Mixed hyperlipidemia: Secondary | ICD-10-CM | POA: Diagnosis not present

## 2020-12-19 DIAGNOSIS — I2510 Atherosclerotic heart disease of native coronary artery without angina pectoris: Secondary | ICD-10-CM | POA: Diagnosis not present

## 2020-12-19 DIAGNOSIS — G8929 Other chronic pain: Secondary | ICD-10-CM | POA: Diagnosis not present

## 2020-12-20 MED ORDER — ROSUVASTATIN CALCIUM 5 MG PO TABS: 5.0000 mg | ORAL_TABLET | Freq: Every day | ORAL | 6 refills | Status: DC

## 2021-02-15 DIAGNOSIS — Z006 Encounter for examination for normal comparison and control in clinical research program: Secondary | ICD-10-CM

## 2021-02-15 NOTE — Research (Signed)
Optimize Research Study  3 Year Follow-up  Patient doing well at this time, the only medication change to report is Rosuvastatin 5 MG has been added. No adverse events to reports.   Current Outpatient Medications:  .  allopurinol (ZYLOPRIM) 300 MG tablet, Take 300 mg by mouth daily., Disp: , Rfl:  .  aspirin EC 81 MG tablet, Take 81 mg by mouth daily., Disp: , Rfl:  .  fluticasone (FLONASE) 50 MCG/ACT nasal spray, Place 1 spray into both nostrils 2 (two) times daily., Disp: , Rfl:  .  ibuprofen (ADVIL,MOTRIN) 200 MG tablet, Take 200 mg by mouth every 6 (six) hours as needed., Disp: , Rfl:  .  loratadine (CLARITIN) 10 MG tablet, Take 10 mg by mouth 2 (two) times daily. , Disp: , Rfl:  .  Multiple Vitamin (MULTIVITAMIN WITH MINERALS) TABS, Take 1 tablet by mouth daily., Disp: , Rfl:  .  Probiotic Product (DAILY PROBIOTIC PO), Take 1 tablet by mouth daily., Disp: , Rfl:  .  rosuvastatin (CRESTOR) 5 MG tablet, Take 1 tablet (5 mg total) by mouth daily., Disp: 30 tablet, Rfl: 6 .  sildenafil (VIAGRA) 100 MG tablet, Take 100 mg by mouth daily as needed for erectile dysfunction. , Disp: , Rfl:  .  Wheat Dextrin (BENEFIBER PO), Take 1 tablet by mouth daily., Disp: , Rfl:  .  hydrocortisone cream 1 %, Apply 1 application topically 3 (three) times a week. (Patient not taking: Reported on 02/15/2021), Disp: , Rfl:  .  ketoconazole (NIZORAL) 2 % cream, daily., Disp: , Rfl:  .  nitroGLYCERIN (NITROSTAT) 0.4 MG SL tablet, Place 1 tablet (0.4 mg total) under the tongue every 5 (five) minutes x 3 doses as needed for chest pain. (Patient not taking: Reported on 02/15/2021), Disp: 25 tablet, Rfl: 12

## 2021-02-21 DIAGNOSIS — G4733 Obstructive sleep apnea (adult) (pediatric): Secondary | ICD-10-CM | POA: Diagnosis not present

## 2021-03-24 DIAGNOSIS — G4733 Obstructive sleep apnea (adult) (pediatric): Secondary | ICD-10-CM | POA: Diagnosis not present

## 2021-04-23 DIAGNOSIS — G4733 Obstructive sleep apnea (adult) (pediatric): Secondary | ICD-10-CM | POA: Diagnosis not present

## 2021-06-08 DIAGNOSIS — N400 Enlarged prostate without lower urinary tract symptoms: Secondary | ICD-10-CM | POA: Diagnosis not present

## 2021-06-08 DIAGNOSIS — N200 Calculus of kidney: Secondary | ICD-10-CM | POA: Diagnosis not present

## 2021-06-08 DIAGNOSIS — N5201 Erectile dysfunction due to arterial insufficiency: Secondary | ICD-10-CM | POA: Diagnosis not present

## 2021-06-19 DIAGNOSIS — N5201 Erectile dysfunction due to arterial insufficiency: Secondary | ICD-10-CM | POA: Diagnosis not present

## 2021-07-27 DIAGNOSIS — R69 Illness, unspecified: Secondary | ICD-10-CM | POA: Diagnosis not present

## 2021-07-27 DIAGNOSIS — R7301 Impaired fasting glucose: Secondary | ICD-10-CM | POA: Diagnosis not present

## 2021-07-27 DIAGNOSIS — Z0000 Encounter for general adult medical examination without abnormal findings: Secondary | ICD-10-CM | POA: Diagnosis not present

## 2021-07-27 DIAGNOSIS — I2510 Atherosclerotic heart disease of native coronary artery without angina pectoris: Secondary | ICD-10-CM | POA: Diagnosis not present

## 2021-07-27 DIAGNOSIS — M109 Gout, unspecified: Secondary | ICD-10-CM | POA: Diagnosis not present

## 2021-07-27 DIAGNOSIS — Z23 Encounter for immunization: Secondary | ICD-10-CM | POA: Diagnosis not present

## 2021-07-27 DIAGNOSIS — E782 Mixed hyperlipidemia: Secondary | ICD-10-CM | POA: Diagnosis not present

## 2021-07-27 DIAGNOSIS — K760 Fatty (change of) liver, not elsewhere classified: Secondary | ICD-10-CM | POA: Diagnosis not present

## 2021-07-27 DIAGNOSIS — N529 Male erectile dysfunction, unspecified: Secondary | ICD-10-CM | POA: Diagnosis not present

## 2021-07-27 DIAGNOSIS — N401 Enlarged prostate with lower urinary tract symptoms: Secondary | ICD-10-CM | POA: Diagnosis not present

## 2021-07-27 DIAGNOSIS — I700 Atherosclerosis of aorta: Secondary | ICD-10-CM | POA: Diagnosis not present

## 2021-08-01 DIAGNOSIS — Z85828 Personal history of other malignant neoplasm of skin: Secondary | ICD-10-CM | POA: Diagnosis not present

## 2021-08-01 DIAGNOSIS — L218 Other seborrheic dermatitis: Secondary | ICD-10-CM | POA: Diagnosis not present

## 2021-08-01 DIAGNOSIS — L570 Actinic keratosis: Secondary | ICD-10-CM | POA: Diagnosis not present

## 2021-08-01 DIAGNOSIS — D1801 Hemangioma of skin and subcutaneous tissue: Secondary | ICD-10-CM | POA: Diagnosis not present

## 2021-08-01 DIAGNOSIS — D0439 Carcinoma in situ of skin of other parts of face: Secondary | ICD-10-CM | POA: Diagnosis not present

## 2021-08-01 DIAGNOSIS — L814 Other melanin hyperpigmentation: Secondary | ICD-10-CM | POA: Diagnosis not present

## 2021-08-01 DIAGNOSIS — L821 Other seborrheic keratosis: Secondary | ICD-10-CM | POA: Diagnosis not present

## 2021-08-01 DIAGNOSIS — L298 Other pruritus: Secondary | ICD-10-CM | POA: Diagnosis not present

## 2021-08-08 DIAGNOSIS — R7301 Impaired fasting glucose: Secondary | ICD-10-CM | POA: Diagnosis not present

## 2021-08-08 DIAGNOSIS — I700 Atherosclerosis of aorta: Secondary | ICD-10-CM | POA: Diagnosis not present

## 2021-08-08 DIAGNOSIS — R69 Illness, unspecified: Secondary | ICD-10-CM | POA: Diagnosis not present

## 2021-08-08 DIAGNOSIS — E782 Mixed hyperlipidemia: Secondary | ICD-10-CM | POA: Diagnosis not present

## 2021-08-08 DIAGNOSIS — K760 Fatty (change of) liver, not elsewhere classified: Secondary | ICD-10-CM | POA: Diagnosis not present

## 2021-08-08 DIAGNOSIS — I2510 Atherosclerotic heart disease of native coronary artery without angina pectoris: Secondary | ICD-10-CM | POA: Diagnosis not present

## 2021-08-08 DIAGNOSIS — M109 Gout, unspecified: Secondary | ICD-10-CM | POA: Diagnosis not present

## 2021-08-08 DIAGNOSIS — Z0000 Encounter for general adult medical examination without abnormal findings: Secondary | ICD-10-CM | POA: Diagnosis not present

## 2021-08-08 DIAGNOSIS — G4733 Obstructive sleep apnea (adult) (pediatric): Secondary | ICD-10-CM | POA: Diagnosis not present

## 2021-08-29 DIAGNOSIS — G4733 Obstructive sleep apnea (adult) (pediatric): Secondary | ICD-10-CM | POA: Diagnosis not present

## 2021-09-14 DIAGNOSIS — G4733 Obstructive sleep apnea (adult) (pediatric): Secondary | ICD-10-CM | POA: Diagnosis not present

## 2021-09-28 DIAGNOSIS — G4733 Obstructive sleep apnea (adult) (pediatric): Secondary | ICD-10-CM | POA: Diagnosis not present

## 2021-10-17 ENCOUNTER — Ambulatory Visit: Payer: Self-pay

## 2021-10-17 NOTE — Telephone Encounter (Signed)
°  Chief Complaint: eye pain Symptoms: blurred vision, dizziness, nausea Frequency: 1 hr Pertinent Negatives: Patient denies pain in L eye Disposition: [] ED /[x] Urgent Care (no appt availability in office) / [] Appointment(In office/virtual)/ []  [place] [facility]/ [] Home Care/ [] Refused Recommended Disposition /[] [place] [facility]/ []  Follow-up with PCP Additional Notes: Pt states he had a flash of bright light suddenly then ripple effect of vision. Hard for him to focus vision and now vision blurred in L eye. He also c/o dizziness and nausea but mild. Advised pt to go to [facility] or ED whichever was closest to him to be seen. Pt states he was trying to find an eye dr to see but no one was available but he would go to UC.    Summary: eye pain, dizziness and nausea.   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 DESCRIPTION: "What is the vision loss like? Describe it for me." (e.g., complete vision loss, blurred vision, double vision, floaters, etc.)     Blind spot  2. LOCATION: "One or both eyes?" If one, ask: "Which eye?"     L eye 3. SEVERITY: "Can you see anything?" If Yes, ask: "What can you see?" (e.g., fine print)     Blurred, hard to focus 4. ONSET: "When did this begin?" "Did it start suddenly or has this been gradual?"     A few hrs  5. PATTERN: "Does this come and go, or has it been constant since it started?"     Constant  6. PAIN: "Is there any pain in your eye(s)?"  (Scale 1-10; or mild, moderate, severe)     No 7. CONTACTS-GLASSES: "Do you wear contacts or glasses?"     Glasses,  reading and driving 9. OTHER SYMPTOMS: "Do you have any other symptoms?" (e.g., confusion, headache, arm or leg weakness, speech problems)     Dizziness, and nausea  Protocols used: Vision Loss or Change-A-AH

## 2021-10-19 ENCOUNTER — Ambulatory Visit: Payer: Medicare HMO | Admitting: Cardiovascular Disease

## 2021-10-19 ENCOUNTER — Encounter: Payer: Self-pay | Admitting: Cardiovascular Disease

## 2021-10-19 ENCOUNTER — Other Ambulatory Visit: Payer: Self-pay

## 2021-10-19 VITALS — BP 132/90 | HR 65 | Ht 67.0 in | Wt 226.8 lb

## 2021-10-19 DIAGNOSIS — I10 Essential (primary) hypertension: Secondary | ICD-10-CM

## 2021-10-19 DIAGNOSIS — I2510 Atherosclerotic heart disease of native coronary artery without angina pectoris: Secondary | ICD-10-CM | POA: Diagnosis not present

## 2021-10-19 DIAGNOSIS — E782 Mixed hyperlipidemia: Secondary | ICD-10-CM

## 2021-10-19 NOTE — Patient Instructions (Signed)
Medication Instructions:   *If you need a refill on your cardiac medications before your next appointment, please call your pharmacy*   Lab Work: NONE If you have labs (blood work) drawn today and your tests are completely normal, you will receive your results only by: [place] (if you have MyChart) OR A paper copy in the mail If you have any lab test that is abnormal or we need to change your treatment, we will call you to review the results.   Testing/Procedures: NONE   Follow-Up: At [facility], you and your health needs are our priority.  As part of our continuing mission to provide you with exceptional heart care, we have created designated Provider Care Teams.  These Care Teams include your primary Cardiologist (physician) and Advanced Practice Providers (APPs -  Physician Assistants and Nurse Practitioners) who all work together to provide you with the care you need, when you need it.   Your next appointment:   1 year(s)  The format for your next appointment:   In Person  Provider:   Sherren Mocha, MD

## 2021-10-19 NOTE — Progress Notes (Signed)
Cardiology Office Note:    Date:  10/19/2021   ID:  Timothy, Wilcox 1941/01/16, MRN [id number]  PCP:  Hulan Fess, MD   [facility] Providers Cardiologist:  Sherren Mocha, MD     Referring MD: Hulan Fess, MD   Chief Complaint  Patient presents with   Coronary Artery Disease    History of Present Illness:    Timothy Wilcox is a 81 y.o. male with a hx of  coronary artery disease and chronic shortness of breath, presenting for follow-up evaluation today.  He initially presented with an inferior STEMI in 2018 and was treated with a drug-eluting stent in the right coronary artery.  He could not tolerate ticagrelor because of shortness of breath.  He was switched to clopidogrel with a good response.  He then presented in 2019 with progressive dyspnea.  He underwent repeat cardiac catheterization as this was his presenting symptom before.  This demonstrated patency of his stent site but progressive and severe stenosis of the left circumflex.  He underwent uncomplicated stenting and continued on clopidogrel.  He continued to have symptoms of shortness of breath. He underwent extensive evaluation including VQ scan, CT pulmonary embolism study, echocardiography, and cardiopulmonary exercise testing.  He also underwent formal pulmonary evaluation.  No clear cause of his dyspnea was found and a diet and exercise program was recommended.  The patient is here alone today. He reports no change in his health over the past year and he is satisfied with how he's feeling. Notes home BP's are ranging in the 130's/low 80's at home. No change in chronic DOE. He is exercising 30 minutes on a routine basis (15 min recumbent stepper and 15 min walk). No chest pain, edema, palpitations, lightheadedness, syncope. He was drinking heavily 1 bottle of wine per night, now stopped drinking in January and notes that he's lost 4 pounds.   Past Medical History:  Diagnosis Date   Arthritis    CAD  (coronary artery disease) 02/26/2018   A. Inf STEMI 6/18: LHC - pLCx 60, dRCA 100, EF 45-50 >> PCI:  DES to RCA // B. Echo 8/18: EF 60-65, nwm, grade 1 dd, MAC. C. 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   Hyperlipemia    intol to some statins   IBS (irritable bowel syndrome)    Medication intolerance    severe SOB due to Brilinta >> changed to Plavix   Renal disorder    kidney stones   Seasonal allergies    Sinus congestion    chronic   Sleep apnea    uses a c-pap    Past Surgical History:  Procedure Laterality Date   COLONOSCOPY     CORONARY STENT INTERVENTION N/A 04/03/2017   Procedure: Coronary Stent Intervention;  Surgeon: Sherren Mocha, MD;  Location: [place] [facility];  Service: Cardiovascular;  Laterality: N/A;   CORONARY STENT INTERVENTION N/A 02/27/2018   Procedure: CORONARY STENT INTERVENTION;  Surgeon: Troy Sine, MD;  Location: [place] [facility];  Service: Cardiovascular;  Laterality: N/A;   INCISION AND DRAINAGE  2011   infected finger   LEFT HEART CATH AND CORONARY ANGIOGRAPHY N/A 04/03/2017   Procedure: Left Heart Cath and Coronary Angiography;  Surgeon: Sherren Mocha, MD;  Location: [place] [facility];  Service: Cardiovascular;  Laterality: N/A;   LEFT HEART CATH AND CORONARY ANGIOGRAPHY N/A 02/27/2018   Procedure: LEFT HEART CATH AND  CORONARY ANGIOGRAPHY;  Surgeon: Troy Sine, MD;  Location: [place] [facility];  Service: Cardiovascular;  Laterality: N/A;   SHOULDER ARTHROSCOPY WITH ROTATOR CUFF REPAIR AND SUBACROMIAL DECOMPRESSION Right 01/29/2013   Procedure: RIGHT SHOULDER ARTHROSCOPY WITH SUBACROMIAL DECOMPRESSION, THREE TENDON ROTATOR CUFF REPAIR;  Surgeon: Cammie Sickle., MD;  Location: [place];  Service: Orthopedics;  Laterality: Right;   TOE DEBRIDEMENT     rt toe cyst    Current Medications: Current Meds  Medication Sig   allopurinol (ZYLOPRIM) 300 MG tablet Take 300 mg  by mouth daily.   aspirin EC 81 MG tablet Take 81 mg by mouth daily.   fluticasone (FLONASE) 50 MCG/ACT nasal spray Place 1 spray into both nostrils 2 (two) times daily.   hydrocortisone cream 1 % Apply 1 application topically 3 (three) times a week.   ibuprofen (ADVIL,MOTRIN) 200 MG tablet Take 200 mg by mouth every 6 (six) hours as needed.   Lactobacillus Rhamnosus, GG, (CULTURELLE) CAPS 1 capsule   loratadine (CLARITIN) 10 MG tablet Take 10 mg by mouth 2 (two) times daily.    Multiple Vitamin (MULTIVITAMIN WITH MINERALS) TABS Take 1 tablet by mouth daily.   Probiotic Product (DAILY PROBIOTIC PO) Take 1 tablet by mouth daily.   rosuvastatin (CRESTOR) 5 MG tablet Take 1 tablet (5 mg total) by mouth daily.   sildenafil (VIAGRA) 100 MG tablet Take 100 mg by mouth daily as needed for erectile dysfunction.    tacrolimus (PROTOPIC) 0.1 % ointment Apply 1 application topically 2 (two) times daily.   Wheat Dextrin (BENEFIBER PO) Take 1 tablet by mouth daily.     Allergies:   Brilinta [ticagrelor], Statins, Dicyclomine, Plavix [clopidogrel], and Tagamet [cimetidine]   Social History   Socioeconomic History   Marital status: Married    Spouse name: Not on file   Number of children: Not on file   Years of education: Not on file   Highest education level: Not on file  Occupational History   Not on file  Tobacco Use   Smoking status: Never   Smokeless tobacco: Never  Vaping Use   Vaping Use: Never used  Substance and Sexual Activity   Alcohol use: Yes    Comment: a bottle of wine every night   Drug use: No   Sexual activity: Not on file  Other Topics Concern   Not on file  Social History Narrative   Not on file   Social Determinants of Health   Financial Resource Strain: Not on file  Food Insecurity: Not on file  Transportation Needs: Not on file  Physical Activity: Not on file  Stress: Not on file  Social Connections: Not on file     Family History: The patient's family  history includes Pancreatic cancer in his mother; Skin cancer in his father and mother.  ROS:   Please see the history of present illness.    All other systems reviewed and are negative.  EKGs/Labs/Other Studies Reviewed:      EKG:  EKG is ordered today.  The ekg ordered today demonstrates NSR 65 bpm, age-indeterminate inferior infarct, no significant change from prior  Recent Labs: 10/26/2020: ALT 35  Recent Lipid Panel    Component Value Date/Time   CHOL 122 10/26/2020 0808   TRIG 124 10/26/2020 0808   HDL 66 10/26/2020 0808   CHOLHDL 1.8 10/26/2020 0808   CHOLHDL 3.3 02/27/2018 0220   VLDL 42 (H) 02/27/2018 0220   LDLCALC 35 10/26/2020 9485  Risk Assessment/Calculations:           Physical Exam:    VS:  BP 132/90    Pulse 65    Ht _0  (1.702 m)    Wt 226 lb 12.8 oz (102.9 kg)    SpO2 96%    BMI 35.52 kg/m     Wt Readings from Last 3 Encounters:  10/19/21 226 lb 12.8 oz (102.9 kg)  07/20/20 222 lb (100.7 kg)  06/08/19 215 lb (97.5 kg)     GEN:  Well nourished, well developed in no acute distress HEENT: Normal NECK: No JVD; No carotid bruits LYMPHATICS: No lymphadenopathy CARDIAC: RRR, no murmurs, rubs, gallops RESPIRATORY:  Clear to auscultation without rales, wheezing or rhonchi  ABDOMEN: Soft, non-tender, non-distended MUSCULOSKELETAL:  No edema; No deformity  SKIN: Warm and dry NEUROLOGIC:  Alert and oriented x 3 PSYCHIATRIC:  Normal affect   ASSESSMENT:    1. Coronary artery disease involving native coronary artery of native heart without angina pectoris   2. Mixed hyperlipidemia   3. Essential hypertension    PLAN:    In order of problems listed above:  Stable without angina. Continue ASA, statin drug.  LDL at goal, 55 mg/dL on low dose crestor.  BP reviewed in detail. Now running high normal BP range at home. Reviewed focus on continued compliance with CPAP, weight loss, diet, and alcohol reduction. He will keep an eye on this and we  reviewed parameters to call if diastolic BP becomes 90 or greater on home readings.          Medication Adjustments/Labs and Tests Ordered: Current medicines are reviewed at length with the patient today.  Concerns regarding medicines are outlined above.  Orders Placed This Encounter  Procedures   EKG 12-Lead   No orders of the defined types were placed in this encounter.   Patient Instructions  Medication Instructions:   *If you need a refill on your cardiac medications before your next appointment, please call your pharmacy*   Lab Work: NONE If you have labs (blood work) drawn today and your tests are completely normal, you will receive your results only by: [place] (if you have MyChart) OR A paper copy in the mail If you have any lab test that is abnormal or we need to change your treatment, we will call you to review the results.   Testing/Procedures: NONE   Follow-Up: At [facility], you and your health needs are our priority.  As part of our continuing mission to provide you with exceptional heart care, we have created designated Provider Care Teams.  These Care Teams include your primary Cardiologist (physician) and Advanced Practice Providers (APPs -  Physician Assistants and Nurse Practitioners) who all work together to provide you with the care you need, when you need it.   Your next appointment:   1 year(s)  The format for your next appointment:   In Person  Provider:   Sherren Mocha, MD       Signed, Sherren Mocha, MD  10/19/2021 5:03 PM    [place]

## 2021-10-24 ENCOUNTER — Ambulatory Visit: Payer: Medicare HMO | Admitting: Cardiovascular Disease

## 2021-10-25 DIAGNOSIS — G4733 Obstructive sleep apnea (adult) (pediatric): Secondary | ICD-10-CM | POA: Diagnosis not present

## 2021-10-29 DIAGNOSIS — G4733 Obstructive sleep apnea (adult) (pediatric): Secondary | ICD-10-CM | POA: Diagnosis not present

## 2021-11-12 ENCOUNTER — Encounter: Payer: Self-pay | Admitting: Cardiovascular Disease

## 2021-11-12 DIAGNOSIS — E782 Mixed hyperlipidemia: Secondary | ICD-10-CM

## 2021-11-13 NOTE — Telephone Encounter (Signed)
Called patient to review complaint of recent feelings of lightheadedness and weakness. Pt confirms that over the last week he has been constant feelings of weakness and lightheadedness that worsens with movement. Pt condones that it's worse when he's working or walking a lot, and seems to mildly improve with rest. Pt supplied following BP readings: 1/30= 105/45 1/31= 115/50 2/1= 130/70 2/6 (today)=118/60 Pt denies use of nitroglycerin or sildenafil recently. Pt states he has been staying hydrated and admits to recently increasing his intake of fluids. Pt states that this is how he felt in the past when he was taking Plavix and symptoms resolved once medication was removed, however, he is not currently on this medication or any antiplatelet. Pt does state that symptoms begin approx 1 week after he developed what he believes was "a small bout of food poisoning" where he experienced mild diarrhea for a few days. Pt does state that he has been experiencing lower R-sided quadrant pain that's intemittent, but occurring more frequently and much worse with laying down. Will forward to [place] for advice. Pt not currently on any BP medications to which I could advise to hold. Informed pt to maintain increased fluid hydration and rest when possible, as well as, slow position changes.

## 2021-11-14 ENCOUNTER — Encounter: Payer: Self-pay | Admitting: Cardiovascular Disease

## 2021-11-14 DIAGNOSIS — R0602 Shortness of breath: Secondary | ICD-10-CM | POA: Diagnosis not present

## 2021-11-14 DIAGNOSIS — Z7689 Persons encountering health services in other specified circumstances: Secondary | ICD-10-CM | POA: Diagnosis not present

## 2021-11-14 DIAGNOSIS — Z03818 Encounter for observation for suspected exposure to other biological agents ruled out: Secondary | ICD-10-CM | POA: Diagnosis not present

## 2021-11-14 DIAGNOSIS — R5383 Other fatigue: Secondary | ICD-10-CM | POA: Diagnosis not present

## 2021-11-14 NOTE — Telephone Encounter (Signed)
Reached out to patient, spoke to wife bc pt still asleep. She will have Tim return call to our office upon waking to review with him.

## 2021-11-14 NOTE — Telephone Encounter (Signed)
Agree with recommendations to liberalize fluids and salt until BP improves. As you point out he is not on any antihypertensive medication. If he continues to feel poorly he should have labs to check a CBC, metabolic panel, and should reach out to PCP.

## 2021-11-14 NOTE — Telephone Encounter (Signed)
Spoke to patient who agrees with plan. Pt coming in today for labwork and is going to call his PCP for appt.

## 2021-11-29 DIAGNOSIS — G4733 Obstructive sleep apnea (adult) (pediatric): Secondary | ICD-10-CM | POA: Diagnosis not present

## 2021-12-04 DIAGNOSIS — H52223 Regular astigmatism, bilateral: Secondary | ICD-10-CM | POA: Diagnosis not present

## 2021-12-04 DIAGNOSIS — Z135 Encounter for screening for eye and ear disorders: Secondary | ICD-10-CM | POA: Diagnosis not present

## 2021-12-04 DIAGNOSIS — H524 Presbyopia: Secondary | ICD-10-CM | POA: Diagnosis not present

## 2021-12-04 DIAGNOSIS — H2513 Age-related nuclear cataract, bilateral: Secondary | ICD-10-CM | POA: Diagnosis not present

## 2021-12-04 DIAGNOSIS — H5203 Hypermetropia, bilateral: Secondary | ICD-10-CM | POA: Diagnosis not present

## 2021-12-23 ENCOUNTER — Other Ambulatory Visit: Payer: Self-pay | Admitting: Cardiovascular Disease

## 2021-12-23 DIAGNOSIS — E785 Hyperlipidemia, unspecified: Secondary | ICD-10-CM

## 2021-12-27 DIAGNOSIS — G4733 Obstructive sleep apnea (adult) (pediatric): Secondary | ICD-10-CM | POA: Diagnosis not present

## 2022-01-27 DIAGNOSIS — G4733 Obstructive sleep apnea (adult) (pediatric): Secondary | ICD-10-CM | POA: Diagnosis not present

## 2022-02-14 DIAGNOSIS — G4733 Obstructive sleep apnea (adult) (pediatric): Secondary | ICD-10-CM | POA: Diagnosis not present

## 2022-02-26 DIAGNOSIS — G4733 Obstructive sleep apnea (adult) (pediatric): Secondary | ICD-10-CM | POA: Diagnosis not present

## 2022-03-29 DIAGNOSIS — G4733 Obstructive sleep apnea (adult) (pediatric): Secondary | ICD-10-CM | POA: Diagnosis not present

## 2022-04-28 DIAGNOSIS — G4733 Obstructive sleep apnea (adult) (pediatric): Secondary | ICD-10-CM | POA: Diagnosis not present

## 2022-05-29 DIAGNOSIS — G4733 Obstructive sleep apnea (adult) (pediatric): Secondary | ICD-10-CM | POA: Diagnosis not present

## 2022-06-29 DIAGNOSIS — G4733 Obstructive sleep apnea (adult) (pediatric): Secondary | ICD-10-CM | POA: Diagnosis not present

## 2022-07-27 DIAGNOSIS — R69 Illness, unspecified: Secondary | ICD-10-CM | POA: Diagnosis not present

## 2022-07-27 DIAGNOSIS — Z23 Encounter for immunization: Secondary | ICD-10-CM | POA: Diagnosis not present

## 2022-07-27 DIAGNOSIS — R7301 Impaired fasting glucose: Secondary | ICD-10-CM | POA: Diagnosis not present

## 2022-07-27 DIAGNOSIS — E782 Mixed hyperlipidemia: Secondary | ICD-10-CM | POA: Diagnosis not present

## 2022-07-27 DIAGNOSIS — M109 Gout, unspecified: Secondary | ICD-10-CM | POA: Diagnosis not present

## 2022-07-27 DIAGNOSIS — K760 Fatty (change of) liver, not elsewhere classified: Secondary | ICD-10-CM | POA: Diagnosis not present

## 2022-07-27 DIAGNOSIS — Z0000 Encounter for general adult medical examination without abnormal findings: Secondary | ICD-10-CM | POA: Diagnosis not present

## 2022-07-27 DIAGNOSIS — I700 Atherosclerosis of aorta: Secondary | ICD-10-CM | POA: Diagnosis not present

## 2022-07-27 DIAGNOSIS — F1010 Alcohol abuse, uncomplicated: Secondary | ICD-10-CM | POA: Diagnosis not present

## 2022-07-29 DIAGNOSIS — G4733 Obstructive sleep apnea (adult) (pediatric): Secondary | ICD-10-CM | POA: Diagnosis not present

## 2022-08-03 DIAGNOSIS — Z6835 Body mass index (BMI) 35.0-35.9, adult: Secondary | ICD-10-CM | POA: Diagnosis not present

## 2022-08-03 DIAGNOSIS — Z0000 Encounter for general adult medical examination without abnormal findings: Secondary | ICD-10-CM | POA: Diagnosis not present

## 2022-08-03 DIAGNOSIS — N529 Male erectile dysfunction, unspecified: Secondary | ICD-10-CM | POA: Diagnosis not present

## 2022-08-03 DIAGNOSIS — I700 Atherosclerosis of aorta: Secondary | ICD-10-CM | POA: Diagnosis not present

## 2022-08-03 DIAGNOSIS — E782 Mixed hyperlipidemia: Secondary | ICD-10-CM | POA: Diagnosis not present

## 2022-08-03 DIAGNOSIS — M109 Gout, unspecified: Secondary | ICD-10-CM | POA: Diagnosis not present

## 2022-08-06 DIAGNOSIS — N200 Calculus of kidney: Secondary | ICD-10-CM | POA: Diagnosis not present

## 2022-08-06 DIAGNOSIS — N281 Cyst of kidney, acquired: Secondary | ICD-10-CM | POA: Diagnosis not present

## 2022-08-06 DIAGNOSIS — N400 Enlarged prostate without lower urinary tract symptoms: Secondary | ICD-10-CM | POA: Diagnosis not present

## 2022-08-08 DIAGNOSIS — D1801 Hemangioma of skin and subcutaneous tissue: Secondary | ICD-10-CM | POA: Diagnosis not present

## 2022-08-08 DIAGNOSIS — B351 Tinea unguium: Secondary | ICD-10-CM | POA: Diagnosis not present

## 2022-08-08 DIAGNOSIS — L218 Other seborrheic dermatitis: Secondary | ICD-10-CM | POA: Diagnosis not present

## 2022-08-08 DIAGNOSIS — L821 Other seborrheic keratosis: Secondary | ICD-10-CM | POA: Diagnosis not present

## 2022-08-08 DIAGNOSIS — D225 Melanocytic nevi of trunk: Secondary | ICD-10-CM | POA: Diagnosis not present

## 2022-08-08 DIAGNOSIS — C44319 Basal cell carcinoma of skin of other parts of face: Secondary | ICD-10-CM | POA: Diagnosis not present

## 2022-08-08 DIAGNOSIS — Z85828 Personal history of other malignant neoplasm of skin: Secondary | ICD-10-CM | POA: Diagnosis not present

## 2022-08-08 DIAGNOSIS — L570 Actinic keratosis: Secondary | ICD-10-CM | POA: Diagnosis not present

## 2022-08-08 DIAGNOSIS — D485 Neoplasm of uncertain behavior of skin: Secondary | ICD-10-CM | POA: Diagnosis not present

## 2022-08-08 DIAGNOSIS — D2262 Melanocytic nevi of left upper limb, including shoulder: Secondary | ICD-10-CM | POA: Diagnosis not present

## 2022-08-10 ENCOUNTER — Other Ambulatory Visit (HOSPITAL_BASED_OUTPATIENT_CLINIC_OR_DEPARTMENT_OTHER): Payer: Self-pay

## 2022-08-10 MED ORDER — COMIRNATY 30 MCG/0.3ML IM SUSY
PREFILLED_SYRINGE | INTRAMUSCULAR | 0 refills | Status: DC
  Filled 2022-08-10: qty 0.3, 1d supply, fill #0

## 2022-08-29 DIAGNOSIS — G4733 Obstructive sleep apnea (adult) (pediatric): Secondary | ICD-10-CM | POA: Diagnosis not present

## 2022-09-24 DIAGNOSIS — C44319 Basal cell carcinoma of skin of other parts of face: Secondary | ICD-10-CM | POA: Diagnosis not present

## 2022-10-11 DIAGNOSIS — U071 COVID-19: Secondary | ICD-10-CM | POA: Diagnosis not present

## 2022-10-11 DIAGNOSIS — R059 Cough, unspecified: Secondary | ICD-10-CM | POA: Diagnosis not present

## 2022-10-29 DIAGNOSIS — H1031 Unspecified acute conjunctivitis, right eye: Secondary | ICD-10-CM | POA: Diagnosis not present

## 2022-10-29 DIAGNOSIS — Z6836 Body mass index (BMI) 36.0-36.9, adult: Secondary | ICD-10-CM | POA: Diagnosis not present

## 2022-10-30 ENCOUNTER — Ambulatory Visit: Payer: Medicare HMO | Attending: Cardiovascular Disease | Admitting: Cardiovascular Disease

## 2022-10-30 ENCOUNTER — Encounter: Payer: Self-pay | Admitting: Cardiovascular Disease

## 2022-10-30 VITALS — BP 130/80 | HR 80 | Ht 67.0 in | Wt 228.6 lb

## 2022-10-30 DIAGNOSIS — E782 Mixed hyperlipidemia: Secondary | ICD-10-CM

## 2022-10-30 DIAGNOSIS — I10 Essential (primary) hypertension: Secondary | ICD-10-CM

## 2022-10-30 DIAGNOSIS — I2510 Atherosclerotic heart disease of native coronary artery without angina pectoris: Secondary | ICD-10-CM

## 2022-10-30 MED ORDER — AMLODIPINE BESYLATE 5 MG PO TABS: 5.0000 mg | ORAL_TABLET | Freq: Every day | ORAL | 3 refills | Status: DC

## 2022-10-30 NOTE — Progress Notes (Signed)
Cardiology Office Note:    Date:  10/30/2022   ID:  Timothy Wilcox, Timothy Wilcox 10-Jul-1941, MRN [id number]  PCP:  Hulan Fess, MD   [place] Providers Cardiologist:  Sherren Mocha, MD     Referring MD: Hulan Fess, MD   Chief Complaint  Patient presents with   Coronary Artery Disease    History of Present Illness:    Timothy Wilcox is a 82 y.o. male with a hx of coronary artery disease and chronic shortness of breath, presenting for follow-up evaluation today.  He initially presented with an inferior STEMI in 2018 and was treated with a drug-eluting stent in the right coronary artery.  He could not tolerate ticagrelor because of shortness of breath.  He was switched to clopidogrel with a good response.  He then presented in 2019 with progressive dyspnea.  He underwent repeat cardiac catheterization as this was his presenting symptom before.  This demonstrated patency of his stent site but progressive and severe stenosis of the left circumflex.  He underwent uncomplicated stenting and continued on clopidogrel.  He continued to have symptoms of shortness of breath. He underwent extensive evaluation including VQ scan, CT pulmonary embolism study, echocardiography, and cardiopulmonary exercise testing.  He also underwent formal pulmonary evaluation.  No clear cause of his dyspnea was found and a diet and exercise program was recommended. Since 2019 he has done well with no change in his symptoms.   The patient is here alone today. He is doing stretches, recumbent bike, and walking about every other day without exertional symptoms. No angina or dyspnea reported. No current issues with shortness of breath which has been a prominent complaint in the past. Denies edema, palpitations, orthopnea, or PND. Overall doing well. Unfortunately, his wife is battling ovarian CA. Pt reports his home BP's are frequently > 140 mmHg.   Past Medical History:  Diagnosis Date   Arthritis    CAD  (coronary artery disease) 02/26/2018   A. Inf STEMI 6/18: LHC - pLCx 60, dRCA 100, EF 45-50 >> PCI:  DES to RCA // B. Echo 8/18: EF 60-65, nwm, grade 1 dd, MAC. C. 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   Hyperlipemia    intol to some statins   IBS (irritable bowel syndrome)    Medication intolerance    severe SOB due to Brilinta >> changed to Plavix   Renal disorder    kidney stones   Seasonal allergies    Sinus congestion    chronic   Sleep apnea    uses a c-pap    Past Surgical History:  Procedure Laterality Date   COLONOSCOPY     CORONARY STENT INTERVENTION N/A 04/03/2017   Procedure: Coronary Stent Intervention;  Surgeon: Sherren Mocha, MD;  Location: [place] [facility];  Service: Cardiovascular;  Laterality: N/A;   CORONARY STENT INTERVENTION N/A 02/27/2018   Procedure: CORONARY STENT INTERVENTION;  Surgeon: Troy Sine, MD;  Location: [place] [facility];  Service: Cardiovascular;  Laterality: N/A;   INCISION AND DRAINAGE  2011   infected finger   LEFT HEART CATH AND CORONARY ANGIOGRAPHY N/A 04/03/2017   Procedure: Left Heart Cath and Coronary Angiography;  Surgeon: Sherren Mocha, MD;  Location: [place] [facility];  Service: Cardiovascular;  Laterality: N/A;   LEFT HEART CATH AND CORONARY ANGIOGRAPHY N/A 02/27/2018   Procedure: LEFT HEART CATH AND CORONARY ANGIOGRAPHY;  Surgeon: Shelva Majestic  A, MD;  Location: [place] [facility];  Service: Cardiovascular;  Laterality: N/A;   SHOULDER ARTHROSCOPY WITH ROTATOR CUFF REPAIR AND SUBACROMIAL DECOMPRESSION Right 01/29/2013   Procedure: RIGHT SHOULDER ARTHROSCOPY WITH SUBACROMIAL DECOMPRESSION, THREE TENDON ROTATOR CUFF REPAIR;  Surgeon: Cammie Sickle., MD;  Location: [place];  Service: Orthopedics;  Laterality: Right;   TOE DEBRIDEMENT     rt toe cyst    Current Medications: Current Meds  Medication Sig   allopurinol (ZYLOPRIM) 300 MG tablet Take 300 mg  by mouth daily.   amLODipine (NORVASC) 5 MG tablet Take 1 tablet (5 mg total) by mouth daily.   aspirin EC 81 MG tablet Take 81 mg by mouth daily.   COVID-19 mRNA vaccine 2023-2024 (COMIRNATY) syringe Inject into the muscle.   fluticasone (FLONASE) 50 MCG/ACT nasal spray Place 1 spray into both nostrils 2 (two) times daily.   hydrocortisone cream 1 % Apply 1 application topically 3 (three) times a week.   ibuprofen (ADVIL,MOTRIN) 200 MG tablet Take 200 mg by mouth every 6 (six) hours as needed.   Lactobacillus Rhamnosus, GG, (CULTURELLE) CAPS 1 capsule   loratadine (CLARITIN) 10 MG tablet Take 10 mg by mouth 2 (two) times daily.    Multiple Vitamin (MULTIVITAMIN WITH MINERALS) TABS Take 1 tablet by mouth daily.   Probiotic Product (DAILY PROBIOTIC PO) Take 1 tablet by mouth daily.   rosuvastatin (CRESTOR) 5 MG tablet TAKE ONE TABLET BY MOUTH DAILY   sildenafil (VIAGRA) 100 MG tablet Take 100 mg by mouth daily as needed for erectile dysfunction.    tacrolimus (PROTOPIC) 0.1 % ointment Apply 1 application topically 2 (two) times daily.   Wheat Dextrin (BENEFIBER PO) Take 1 tablet by mouth daily.     Allergies:   Brilinta [ticagrelor], Statins, Dicyclomine, Plavix [clopidogrel], and Tagamet [cimetidine]   Social History   Socioeconomic History   Marital status: Married    Spouse name: Not on file   Number of children: Not on file   Years of education: Not on file   Highest education level: Not on file  Occupational History   Not on file  Tobacco Use   Smoking status: Never   Smokeless tobacco: Never  Vaping Use   Vaping Use: Never used  Substance and Sexual Activity   Alcohol use: Yes    Comment: a bottle of wine every night   Drug use: No   Sexual activity: Not on file  Other Topics Concern   Not on file  Social History Narrative   Not on file   Social Determinants of Health   Financial Resource Strain: Not on file  Food Insecurity: Not on file  Transportation Needs: Not  on file  Physical Activity: Not on file  Stress: Not on file  Social Connections: Not on file     Family History: The patient's family history includes Pancreatic cancer in his mother; Skin cancer in his father and mother.  ROS:   Please see the history of present illness.    All other systems reviewed and are negative.  EKGs/Labs/Other Studies Reviewed:     EKG:  EKG is ordered today.  The ekg ordered today demonstrates NSR 80 bpm, within normal limits  Recent Labs: No results found for requested labs within last 365 days.  Recent Lipid Panel    Component Value Date/Time   CHOL 122 10/26/2020 0808   TRIG 124 10/26/2020 0808   HDL 66 10/26/2020 0808   CHOLHDL 1.8 10/26/2020  0808   CHOLHDL 3.3 02/27/2018 0220   VLDL 42 (H) 02/27/2018 0220   LDLCALC 35 10/26/2020 0808     Risk Assessment/Calculations:                Physical Exam:    VS:  BP 130/80   Pulse 80   Ht _0  (1.702 m)   Wt 228 lb 9.6 oz (103.7 kg)   SpO2 94%   BMI 35.80 kg/m     Wt Readings from Last 3 Encounters:  10/30/22 228 lb 9.6 oz (103.7 kg)  10/19/21 226 lb 12.8 oz (102.9 kg)  07/20/20 222 lb (100.7 kg)     GEN:  Well nourished, well developed in no acute distress HEENT: Normal NECK: No JVD; No carotid bruits LYMPHATICS: No lymphadenopathy CARDIAC: RRR, no murmurs, rubs, gallops RESPIRATORY:  Clear to auscultation without rales, wheezing or rhonchi  ABDOMEN: Soft, non-tender, non-distended MUSCULOSKELETAL:  No edema; No deformity  SKIN: Warm and dry NEUROLOGIC:  Alert and oriented x 3 PSYCHIATRIC:  Normal affect   ASSESSMENT:    1. Essential hypertension   2. Coronary artery disease involving native coronary artery of native heart without angina pectoris   3. Mixed hyperlipidemia    PLAN:    In order of problems listed above:  BP above goal at home. States average systolic readings are about 145 mmHg. We discussed treatment options. He will work on lifestyle modification  and weight loss. I am going to start him on amlodipine 5 mg daily. Discussed potential side effects including leg swelling. He will let me know if problems. Continue current Rx. No cardiac-limitation. No angina.  On low dose rosuvastatin with dose limited by side effects. LDL 34 mg/dL. Continue current Rx and work on lifestyle modification.     Medication Adjustments/Labs and Tests Ordered: Current medicines are reviewed at length with the patient today.  Concerns regarding medicines are outlined above.  Orders Placed This Encounter  Procedures   EKG 12-Lead   Meds ordered this encounter  Medications   amLODipine (NORVASC) 5 MG tablet    Sig: Take 1 tablet (5 mg total) by mouth daily.    Dispense:  90 tablet    Refill:  3    Patient Instructions  Medication Instructions:  START Amlodipine (Norvasc) 48m daily *If you need a refill on your cardiac medications before your next appointment, please call your pharmacy*   Lab Work: NONE If you have labs (blood work) drawn today and your tests are completely normal, you will receive your results only by: [place](if you have MyChart) OR A paper copy in the mail If you have any lab test that is abnormal or we need to change your treatment, we will call you to review the results.  Testing/Procedures: NONE  Follow-Up: [facility] and your [facility] needs are our priority.  As part of our continuing mission to provide you with exceptional heart care, we have created designated Provider Care Teams.  These Care Teams include your primary Cardiologist (physician) and Advanced Practice Providers (APPs -  Physician Assistants and Nurse Practitioners) who all work together to provide you with the care you need, when you need it.  Your next appointment:   1 year(s)  Provider:   MSherren Mocha MD        Signed, MSherren Mocha MD  10/30/2022 12:44 PM    [place]

## 2022-10-30 NOTE — Patient Instructions (Signed)
Medication Instructions:  START Amlodipine (Norvasc) '5mg'$  daily *If you need a refill on your cardiac medications before your next appointment, please call your pharmacy*   Lab Work: NONE If you have labs (blood work) drawn today and your tests are completely normal, you will receive your results only by: Timothy Wilcox (if you have MyChart) OR A paper copy in the mail If you have any lab test that is abnormal or we need to change your treatment, we will call you to review the results.  Testing/Procedures: NONE  Follow-Up: At [facility], you and your health needs are our priority.  As part of our continuing mission to provide you with exceptional heart care, we have created designated Provider Care Teams.  These Care Teams include your primary Cardiologist (physician) and Advanced Practice Providers (APPs -  Physician Assistants and Nurse Practitioners) who all work together to provide you with the care you need, when you need it.  Your next appointment:   1 year(s)  Provider:   Sherren Mocha, MD

## 2022-11-06 DIAGNOSIS — G4733 Obstructive sleep apnea (adult) (pediatric): Secondary | ICD-10-CM | POA: Diagnosis not present

## 2022-11-19 ENCOUNTER — Encounter (HOSPITAL_BASED_OUTPATIENT_CLINIC_OR_DEPARTMENT_OTHER): Payer: Self-pay | Admitting: Emergency Medicine

## 2022-11-19 ENCOUNTER — Emergency Department (HOSPITAL_BASED_OUTPATIENT_CLINIC_OR_DEPARTMENT_OTHER): Payer: Medicare HMO

## 2022-11-19 ENCOUNTER — Emergency Department (HOSPITAL_BASED_OUTPATIENT_CLINIC_OR_DEPARTMENT_OTHER)
Admission: EM | Admit: 2022-11-19 | Discharge: 2022-11-19 | Disposition: A | Discharge status: Home / Self Care | Payer: Medicare HMO | Attending: Emergency Medicine | Admitting: Emergency Medicine

## 2022-11-19 ENCOUNTER — Other Ambulatory Visit: Payer: Self-pay

## 2022-11-19 DIAGNOSIS — Q453 Other congenital malformations of pancreas and pancreatic duct: Secondary | ICD-10-CM

## 2022-11-19 DIAGNOSIS — I714 Abdominal aortic aneurysm, without rupture, unspecified: Secondary | ICD-10-CM | POA: Diagnosis not present

## 2022-11-19 DIAGNOSIS — N200 Calculus of kidney: Secondary | ICD-10-CM

## 2022-11-19 DIAGNOSIS — K8689 Other specified diseases of pancreas: Secondary | ICD-10-CM | POA: Insufficient documentation

## 2022-11-19 DIAGNOSIS — I2510 Atherosclerotic heart disease of native coronary artery without angina pectoris: Secondary | ICD-10-CM | POA: Insufficient documentation

## 2022-11-19 DIAGNOSIS — I7143 Infrarenal abdominal aortic aneurysm, without rupture: Secondary | ICD-10-CM | POA: Diagnosis not present

## 2022-11-19 DIAGNOSIS — Z7982 Long term (current) use of aspirin: Secondary | ICD-10-CM | POA: Insufficient documentation

## 2022-11-19 DIAGNOSIS — K760 Fatty (change of) liver, not elsewhere classified: Secondary | ICD-10-CM | POA: Diagnosis not present

## 2022-11-19 DIAGNOSIS — R1032 Left lower quadrant pain: Secondary | ICD-10-CM | POA: Diagnosis not present

## 2022-11-19 DIAGNOSIS — N132 Hydronephrosis with renal and ureteral calculous obstruction: Secondary | ICD-10-CM | POA: Diagnosis not present

## 2022-11-19 LAB — CBC WITH DIFFERENTIAL/PLATELET
Abs Immature Granulocytes: 0.09 10*3/uL — ABNORMAL HIGH (ref 0.00–0.07)
Basophils Absolute: 0.1 10*3/uL (ref 0.0–0.1)
Basophils Relative: 1 %
Eosinophils Absolute: 0 10*3/uL (ref 0.0–0.5)
Eosinophils Relative: 0 %
HCT: 45.3 % (ref 39.0–52.0)
Hemoglobin: 15.9 g/dL (ref 13.0–17.0)
Immature Granulocytes: 1 %
Lymphocytes Relative: 7 %
Lymphs Abs: 1 10*3/uL (ref 0.7–4.0)
MCH: 33.5 pg (ref 26.0–34.0)
MCHC: 35.1 g/dL (ref 30.0–36.0)
MCV: 95.6 fL (ref 80.0–100.0)
Monocytes Absolute: 0.9 10*3/uL (ref 0.1–1.0)
Monocytes Relative: 6 %
Neutro Abs: 12.9 10*3/uL — ABNORMAL HIGH (ref 1.7–7.7)
Neutrophils Relative %: 85 %
Platelets: ADEQUATE 10*3/uL (ref 150–400)
RBC: 4.74 MIL/uL (ref 4.22–5.81)
RDW: 13.1 % (ref 11.5–15.5)
WBC: 14.9 10*3/uL — ABNORMAL HIGH (ref 4.0–10.5)
nRBC: 0 % (ref 0.0–0.2)

## 2022-11-19 LAB — URINALYSIS, ROUTINE W REFLEX MICROSCOPIC
Bilirubin Urine: NEGATIVE
Glucose, UA: NEGATIVE mg/dL
Leukocytes,Ua: NEGATIVE
Nitrite: NEGATIVE
Protein, ur: 30 mg/dL — AB
RBC / HPF: >50 RBC/hpf (ref 0–5)
Specific Gravity, Urine: 1.031 — ABNORMAL HIGH (ref 1.005–1.030)
pH: 6 (ref 5.0–8.0)

## 2022-11-19 LAB — COMPREHENSIVE METABOLIC PANEL
ALT: 36 U/L (ref 0–44)
AST: 25 U/L (ref 15–41)
Albumin: 4.4 g/dL (ref 3.5–5.0)
Alkaline Phosphatase: 54 U/L (ref 38–126)
Anion gap: 13 (ref 5–15)
BUN: 18 mg/dL (ref 8–23)
CO2: 25 mmol/L (ref 22–32)
Calcium: 10.7 mg/dL — ABNORMAL HIGH (ref 8.9–10.3)
Chloride: 102 mmol/L (ref 98–111)
Creatinine, Ser: 1.19 mg/dL (ref 0.61–1.24)
GFR, Estimated: >60 mL/min (ref >60)
Glucose, Bld: 136 mg/dL — ABNORMAL HIGH (ref 70–99)
Potassium: 4.4 mmol/L (ref 3.5–5.1)
Sodium: 140 mmol/L (ref 135–145)
Total Bilirubin: 0.5 mg/dL (ref 0.3–1.2)
Total Protein: 7.3 g/dL (ref 6.5–8.1)

## 2022-11-19 LAB — LIPASE, BLOOD: Lipase: 33 U/L (ref 11–51)

## 2022-11-19 MED ORDER — MORPHINE SULFATE (PF) 4 MG/ML IV SOLN
4.0000 mg | Freq: Once | INTRAVENOUS | Status: AC
  Administered 2022-11-19: 4 mg via INTRAVENOUS
  Filled 2022-11-19: qty 1

## 2022-11-19 MED ORDER — OXYCODONE-ACETAMINOPHEN 5-325 MG PO TABS: 1.0000 | ORAL_TABLET | Freq: Four times a day (QID) | ORAL | 0 refills | Status: DC | PRN

## 2022-11-19 MED ORDER — IOHEXOL 300 MG/ML  SOLN
100.0000 mL | Freq: Once | INTRAMUSCULAR | Status: AC | PRN
  Administered 2022-11-19: 100 mL via INTRAVENOUS

## 2022-11-19 MED ORDER — TAMSULOSIN HCL 0.4 MG PO CAPS: 0.4000 mg | ORAL_CAPSULE | Freq: Every day | ORAL | 0 refills | Status: DC

## 2022-11-19 MED ORDER — ONDANSETRON HCL 4 MG PO TABS: 4.0000 mg | ORAL_TABLET | Freq: Three times a day (TID) | ORAL | 0 refills | Status: DC | PRN

## 2022-11-19 MED ORDER — ONDANSETRON HCL 4 MG/2ML IJ SOLN
4.0000 mg | Freq: Once | INTRAMUSCULAR | Status: AC
  Administered 2022-11-19: 4 mg via INTRAVENOUS
  Filled 2022-11-19: qty 2

## 2022-11-19 MED ORDER — KETOROLAC TROMETHAMINE 30 MG/ML IJ SOLN
15.0000 mg | Freq: Once | INTRAMUSCULAR | Status: AC
  Administered 2022-11-19: 15 mg via INTRAVENOUS
  Filled 2022-11-19: qty 1

## 2022-11-19 NOTE — ED Provider Notes (Signed)
[facility] Provider Note   CSN: [id number] Arrival date & time: 11/19/22  [id number]     History  Chief Complaint  Patient presents with   Abdominal Pain    Timothy Wilcox is a 82 y.o. male.  HPI     This is a 82 year old male who presents with left lower quadrant pain.  Patient reports that he had onset of abdominal pain that has settled into his left back and left lower quadrant.  He has had similar pain in the past when he has had an obstructing kidney stone although these have always been on the right.  Patient states that he had a bowel movement yesterday.  He is unsure of whether I am a "have a bowel blockage."  Has no history of abdominal surgeries or bowel obstructions in the past.  Has not noted any dysuria or hematuria.  No fevers.  He did not take anything for his pain.  Home Medications Prior to Admission medications   Medication Sig Start Date End Date Taking? Authorizing Provider  ondansetron (ZOFRAN) 4 MG tablet Take 1 tablet (4 mg total) by mouth every 8 (eight) hours as needed for nausea or vomiting. 11/19/22  Yes Zaara Sprowl, Barbette Hair, MD  oxyCODONE-acetaminophen (PERCOCET/ROXICET) 5-325 MG tablet Take 1 tablet by mouth every 6 (six) hours as needed for severe pain. 11/19/22  Yes Duron Meister, Barbette Hair, MD  tamsulosin (FLOMAX) 0.4 MG CAPS capsule Take 1 capsule (0.4 mg total) by mouth daily. 11/19/22  Yes Kyandre Okray, Barbette Hair, MD  allopurinol (ZYLOPRIM) 300 MG tablet Take 300 mg by mouth daily.    [provider]  amLODipine (NORVASC) 5 MG tablet Take 1 tablet (5 mg total) by mouth daily. 10/30/22   Sherren Mocha, MD  aspirin EC 81 MG tablet Take 81 mg by mouth daily.    [provider]  COVID-19 mRNA vaccine [phone] (COMIRNATY) syringe Inject into the muscle. 08/10/22     fluticasone (FLONASE) 50 MCG/ACT nasal spray Place 1 spray into both nostrils 2 (two) times daily.    [provider]  hydrocortisone  cream 1 % Apply 1 application topically 3 (three) times a week.    [provider]  ibuprofen (ADVIL,MOTRIN) 200 MG tablet Take 200 mg by mouth every 6 (six) hours as needed.    [provider]  Lactobacillus Rhamnosus, GG, (CULTURELLE) CAPS 1 capsule    [provider]  loratadine (CLARITIN) 10 MG tablet Take 10 mg by mouth 2 (two) times daily.     [provider]  Multiple Vitamin (MULTIVITAMIN WITH MINERALS) TABS Take 1 tablet by mouth daily.    [provider]  Probiotic Product (DAILY PROBIOTIC PO) Take 1 tablet by mouth daily.    [provider]  rosuvastatin (CRESTOR) 5 MG tablet TAKE ONE TABLET BY MOUTH DAILY 12/25/21   Sherren Mocha, MD  sildenafil (VIAGRA) 100 MG tablet Take 100 mg by mouth daily as needed for erectile dysfunction.     [provider]  tacrolimus (PROTOPIC) 0.1 % ointment Apply 1 application topically 2 (two) times daily. 08/03/21   [provider]  Wheat Dextrin (BENEFIBER PO) Take 1 tablet by mouth daily.    [provider]      Allergies    Brilinta [ticagrelor], Statins, Dicyclomine, Plavix [clopidogrel], and Tagamet [cimetidine]    Review of Systems   Review of Systems  Gastrointestinal:  Positive for abdominal pain. Negative for constipation, nausea and vomiting.  All other systems reviewed and are negative.   Physical Exam Updated Vital Signs BP 134/67   Pulse 73   Temp 97.8 F (36.6 C) (Oral)   Resp 18   Ht 1.702 m (5' 7"$ )   Wt 102.1 kg   SpO2 97%   BMI 35.24 kg/m  Physical Exam Vitals and nursing note reviewed.  Constitutional:      Appearance: He is well-developed. He is obese. He is not ill-appearing.  HENT:     Head: Normocephalic and atraumatic.  Eyes:     Pupils: Pupils are equal, round, and reactive to light.  Cardiovascular:     Rate and Rhythm: Normal rate and regular rhythm.     Heart sounds: Normal heart sounds. No murmur heard. Pulmonary:      Effort: Pulmonary effort is normal. No respiratory distress.     Breath sounds: Normal breath sounds. No wheezing.  Abdominal:     General: Bowel sounds are normal.     Palpations: Abdomen is soft.     Tenderness: There is abdominal tenderness in the left lower quadrant. There is left CVA tenderness. There is no guarding or rebound.  Musculoskeletal:     Cervical back: Neck supple.  Lymphadenopathy:     Cervical: No cervical adenopathy.  Skin:    General: Skin is warm and dry.  Neurological:     Mental Status: He is alert and oriented to person, place, and time.  Psychiatric:        Mood and Affect: Mood normal.     ED Results / Procedures / Treatments   Labs (all labs ordered are listed, but only abnormal results are displayed) Labs Reviewed  COMPREHENSIVE METABOLIC PANEL - Abnormal; Notable for the following components:      Result Value   Glucose, Bld 136 (*)    Calcium 10.7 (*)    All other components within normal limits  CBC WITH DIFFERENTIAL/PLATELET - Abnormal; Notable for the following components:   WBC 14.9 (*)    Neutro Abs 12.9 (*)    Abs Immature Granulocytes 0.09 (*)    All other components within normal limits  URINALYSIS, ROUTINE W REFLEX MICROSCOPIC - Abnormal; Notable for the following components:   Specific Gravity, Urine 1.031 (*)    Hgb urine dipstick LARGE (*)    Ketones, ur TRACE (*)    Protein, ur 30 (*)    Bacteria, UA RARE (*)    All other components within normal limits  LIPASE, BLOOD    EKG EKG Interpretation  Date/Time:  Monday November 19 2022 02:35:08 EST Ventricular Rate:  82 PR Interval:  186 QRS Duration: 86 QT Interval:  376 QTC Calculation: 439 R Axis:   48 Text Interpretation: Normal sinus rhythm Nonspecific ST abnormality Abnormal QRS-T angle, consider primary T wave abnormality Abnormal ECG When compared with ECG of 28-Feb-2018 03:37, No significant change was found Confirmed by Thayer Jew [phone]) on 11/19/2022 4:06:58  AM  Radiology CT ABDOMEN PELVIS W CONTRAST  Result Date: 11/19/2022 CLINICAL DATA:  Left lower quadrant pain. EXAM: CT ABDOMEN AND PELVIS WITH CONTRAST TECHNIQUE: Multidetector CT imaging of the abdomen and pelvis was performed using the standard protocol following bolus administration of intravenous contrast. RADIATION DOSE REDUCTION: This exam was performed according to the departmental dose-optimization program which includes automated exposure control, adjustment of the mA and/or kV according to patient size and/or use of iterative reconstruction technique. CONTRAST:  166m OMNIPAQUE IOHEXOL 300 MG/ML  SOLN COMPARISON:  CT without contrast  03/24/2012 FINDINGS: Lower chest: Scattered linear scar-like opacities in the lung bases. No infiltrate is seen. Mild subpleural reticulation in the lateral basal right lower lobe. Small hiatal hernia. There are calcifications in the circumflex and right coronary arteries and mitral ring. Normal cardiac size. Hepatobiliary: Moderately steatotic liver measuring 19 cm length with scattered calcified granulomas. No mass enhancement. Unremarkable gallbladder and bile ducts. Pancreas: Partial fatty atrophy. This was seen previously. There is no demonstration of a thin walled cystic lesion with a slightly lobular outer wall, posteriorly in the uncinate process of the pancreas. This measures 2.9 x 2 cm on 2:36, and on 5:63 measures 2.5 cm in height. There are no mural nodules or septations visible. The Hounsfield density does not notably change between the initial and delayed phase images, measuring 9.6 Hounsfield units on the first set of images and 9.1 in the delayed phase. 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 Follow-up MRI without and with contrast is recommended. Rest of the pancreas is unremarkable. Spleen: There are calcified granulomas scattered throughout. No other focal abnormality. No splenomegaly. Adrenals/Urinary  Tract: There is no adrenal mass. Both kidneys enhance homogeneously but there is asymmetrically hypoenhancement on the left due to obstructive uropathy. There is asymmetric left perinephric stranding and mild-to-moderate hydroureteronephrosis. This is due to a 4 mm distal ureteral stone 1 cm proximal to the UVJ. Both of the ureters are otherwise clear. There is a 7 mm nonobstructive caliceal stone anteriorly in the left upper pole collecting system. There is a 3 mm stone in the right upper pole collecting system. No further nephrolithiasis is seen.  There is no bladder thickening. Stomach/Bowel: No dilatation or wall thickening including of the appendix. Advanced sigmoid diverticulosis is noted without diverticulitis. Vascular/Lymphatic: Moderate to heavy aortoiliac atherosclerosis. Interval development of a bilobed infrarenal AAA. The upper component is 3.5 x 3.5 cm, fusiform and extends for a length of 3.7 cm. There is tapering to a diameter of 1.9 cm, then a second fusiform aneurysm measuring 2.9 x 2.4 cm ending just above the bifurcation. There is no dissection or penetrating ulcers. There is no lymphadenopathy. Reproductive: Enlarged but unchanged, transverse axis 5.5 cm. Other: Small volume of fluid around the left kidney which could be due to obstructive uropathy or a caliceal leak. There is a small amount of fluid extending along the left paracolic gutter down to the anterior left pelvic side wall but there is no drainable pocket. There is no free air, free hemorrhage or abscess. Musculoskeletal: There is mild levorotary scoliosis osteopenia, and advanced degenerative changes of the lumbar spine. Acquired spinal stenosis noted L3-4 and more so L4-5. Slight degenerative retrolisthesis L2-3 and L3-4. No primary pathologic bone lesions. IMPRESSION: 1. 4 mm distal left ureteral stone 1 cm proximal to the UVJ, with mild-to-moderate obstructive uropathy. 2. Small volume of fluid around the left kidney which could  be due to obstructive uropathy or a caliceal leak. 3. Bilateral nonobstructive nephrolithiasis. 4. 2.9 x 2 x 2.5 cm thin walled cystic lesion in the uncinate process of the pancreas. 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 Follow-up MRI without and with contrast is recommended. This is new from 10 years ago. 5. Moderate hepatic steatosis. 6. Aortic and coronary artery atherosclerosis with interval development of a fusiform bilobed infrarenal AAA, largest component being the upper component measuring 3.5 cm. Recommend follow-up every 2 years. Reference: J Am Coll Radiol [id number].  7. Prostatomegaly. 8. Osteopenia, scoliosis and degenerative change. Aortic Atherosclerosis (ICD10-I70.0). Electronically Signed   By: Telford Nab M.D.   On: 11/19/2022 06:05    Procedures Procedures    Medications Ordered in ED Medications  morphine (PF) 4 MG/ML injection 4 mg (4 mg Intravenous Given 11/19/22 0454)  ondansetron (ZOFRAN) injection 4 mg (4 mg Intravenous Given 11/19/22 0449)  iohexol (OMNIPAQUE) 300 MG/ML solution 100 mL (100 mLs Intravenous Contrast Given 11/19/22 0510)  ketorolac (TORADOL) 30 MG/ML injection 15 mg (15 mg Intravenous Given 11/19/22 [id number])    ED Course/ Medical Decision Making/ A&P                             Medical Decision Making Amount and/or Complexity of Data Reviewed Labs: ordered. Radiology: ordered.  Risk Prescription drug management.   This patient presents to the ED for concern of abdominal pain, back pain, this involves an extensive number of treatment options, and is a complaint that carries with it a high risk of complications and morbidity.  I considered the following differential and admission for this acute, potentially life threatening condition.  The differential diagnosis includes kidney stone, UTI, SBO, diverticulitis  MDM:    This is a 82 year old male who presents with abdominal pain.  Also has some  back pain.  He is nontoxic and vital signs are reassuring.  Patient has some left lower quadrant tenderness to palpation as well as left CVA tenderness.  Labs obtained and reviewed.  He has hematuria.  Creatinine is preserved.  No other metabolic derangements.  He does have a leukocytosis to 14.9.  Lipase is normal.  CT scan was obtained mostly to rule out diverticulitis.  CT scan does not show any evidence of diverticulitis.  It does show evidence of an obstructing kidney stone.  This is the likely culprit of the patient's pain given location.  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  That appears to be without rupture.  Doubt these findings are causing any of his current complaints.  Discussed with the patient supportive measures for kidney stone.  (Labs, imaging, consults)  Labs: I Ordered, and personally interpreted labs.  The pertinent results include: CBC, CMP, lipase, urinalysis  Imaging Studies ordered: I ordered imaging studies including CT abdomen pelvis I independently visualized and interpreted imaging. I agree with the radiologist interpretation  Additional history obtained from chart review.  External records from outside source obtained and reviewed including prior evaluations  Cardiac Monitoring: The patient was not maintained on a cardiac monitor.  If on the cardiac monitor, I personally viewed and interpreted the cardiac monitored which showed an underlying rhythm of: N/A  Reevaluation: After the interventions noted above, I reevaluated the patient and found that they have :improved  Social Determinants of Health:  lives independently  Disposition: Discharge  Co morbidities that complicate the patient evaluation  Past Medical History:  Diagnosis Date   Arthritis    CAD (coronary artery disease) 02/26/2018   A.  Inf STEMI 6/18: LHC - pLCx 60, dRCA 100, EF 45-50 >> PCI:  DES to RCA // B. Echo 8/18: EF 60-65, nwm, grade 1 dd, MAC. C. 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    Dispense:  30 capsule    Refill:  0   oxyCODONE-acetaminophen (PERCOCET/ROXICET) 5-325 MG tablet    Sig: Take 1 tablet by mouth every 6 (six) hours as needed for severe pain.    Dispense:  10 tablet    Refill:  0   ondansetron (ZOFRAN) 4 MG tablet    Sig: Take 1 tablet (4 mg total) by mouth every 8 (eight) hours as needed for nausea or vomiting.    Dispense:  20 tablet    Refill:  0    I have reviewed the patients home medicines and have made adjustments as needed  Problem List / ED Course: Problem List Items Addressed This Visit   None Visit Diagnoses     Kidney stone    -  Primary   Relevant Medications   morphine (PF) 4 MG/ML injection 4 mg (Completed)   oxyCODONE-acetaminophen (PERCOCET/ROXICET) 5-325 MG tablet   Pancreatic abnormality       Infrarenal abdominal aortic aneurysm (AAA) without rupture ([place])                       Final Clinical Impression(s) / ED Diagnoses Final diagnoses:  Kidney stone  Pancreatic abnormality  Infrarenal abdominal aortic aneurysm (AAA) without rupture ([place])    Rx / DC Orders ED Discharge Orders          Ordered     tamsulosin (FLOMAX) 0.4 MG CAPS capsule  Daily        11/19/22 0631    oxyCODONE-acetaminophen (PERCOCET/ROXICET) 5-325 MG tablet  Every 6 hours PRN        11/19/22 0631    ondansetron (ZOFRAN) 4 MG tablet  Every 8 hours PRN        11/19/22 0631              Merryl Hacker, MD 11/19/22 [id number]

## 2022-11-19 NOTE — ED Triage Notes (Signed)
Patient reports " I feel like I have a blocked bowel because I ate a lot of junk yesterday."  Patient reports that the pain has been generalized all day yesterday, so he tried an enema and milk of magnesia, despite having a normal bowel movement yesterday.  Patient reports his left kidney hurts as well.  Patient has a history of kidney stones but reports they have all been on his right side so he does not believe it's a kidney stone.  Patient also endorses "twinges" of his appendix for the past 6 months.

## 2022-11-19 NOTE — Discharge Instructions (Addendum)
You were seen today for pain.  Your CT scan shows an obstructing kidney stone on the left.  This is likely the cause of your pain.  Take medications as prescribed.  If you develop fevers, worsening pain, you should be reevaluated.  Of note, your CT scan also showed a lesion on your pancreas.  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  However, you need an MRI to better characterize this lesion.  Follow-up with your primary doctor to have this ordered as an outpatient.  You are also noted to have some dilation of your aorta.  You need to have an ultrasound every 2 years to make sure that this does not continue to get bigger.

## 2022-11-22 ENCOUNTER — Encounter: Payer: Self-pay | Admitting: Cardiovascular Disease

## 2022-11-22 DIAGNOSIS — I700 Atherosclerosis of aorta: Secondary | ICD-10-CM | POA: Diagnosis not present

## 2022-11-22 DIAGNOSIS — R69 Illness, unspecified: Secondary | ICD-10-CM | POA: Diagnosis not present

## 2022-11-22 DIAGNOSIS — I714 Abdominal aortic aneurysm, without rupture, unspecified: Secondary | ICD-10-CM

## 2022-11-22 NOTE — Telephone Encounter (Signed)
I reviewed ER records. I don't see anything alarming from a cardiovascular standpoint. He will need imaging follow-up of the abdominal aortic aneurysm which is relatively small. I would recommend an abdominal US in 6 months to establish an ultrasound baseline, then likely will follow on an annual basis. There was a finding on the pancreas that is likely benign and this should be followed up with further imaging as recommended by the radiologist (PCP should do this). Let me know if any other concerns. thx

## 2022-12-03 DIAGNOSIS — H52223 Regular astigmatism, bilateral: Secondary | ICD-10-CM | POA: Diagnosis not present

## 2022-12-03 DIAGNOSIS — H5203 Hypermetropia, bilateral: Secondary | ICD-10-CM | POA: Diagnosis not present

## 2022-12-03 DIAGNOSIS — H524 Presbyopia: Secondary | ICD-10-CM | POA: Diagnosis not present

## 2022-12-03 DIAGNOSIS — H2513 Age-related nuclear cataract, bilateral: Secondary | ICD-10-CM | POA: Diagnosis not present

## 2022-12-03 DIAGNOSIS — Z135 Encounter for screening for eye and ear disorders: Secondary | ICD-10-CM | POA: Diagnosis not present

## 2023-01-04 ENCOUNTER — Other Ambulatory Visit: Payer: Self-pay | Admitting: Cardiovascular Disease

## 2023-01-04 DIAGNOSIS — E785 Hyperlipidemia, unspecified: Secondary | ICD-10-CM

## 2023-02-01 DIAGNOSIS — H2513 Age-related nuclear cataract, bilateral: Secondary | ICD-10-CM | POA: Diagnosis not present

## 2023-02-01 DIAGNOSIS — H2512 Age-related nuclear cataract, left eye: Secondary | ICD-10-CM | POA: Diagnosis not present

## 2023-02-01 DIAGNOSIS — H25043 Posterior subcapsular polar age-related cataract, bilateral: Secondary | ICD-10-CM | POA: Diagnosis not present

## 2023-02-01 DIAGNOSIS — H18413 Arcus senilis, bilateral: Secondary | ICD-10-CM | POA: Diagnosis not present

## 2023-02-01 DIAGNOSIS — H25013 Cortical age-related cataract, bilateral: Secondary | ICD-10-CM | POA: Diagnosis not present

## 2023-02-13 DIAGNOSIS — G4733 Obstructive sleep apnea (adult) (pediatric): Secondary | ICD-10-CM | POA: Diagnosis not present

## 2023-03-22 DIAGNOSIS — I700 Atherosclerosis of aorta: Secondary | ICD-10-CM | POA: Diagnosis not present

## 2023-03-22 DIAGNOSIS — M79673 Pain in unspecified foot: Secondary | ICD-10-CM | POA: Diagnosis not present

## 2023-03-22 DIAGNOSIS — M109 Gout, unspecified: Secondary | ICD-10-CM | POA: Diagnosis not present

## 2023-04-29 DIAGNOSIS — H2512 Age-related nuclear cataract, left eye: Secondary | ICD-10-CM | POA: Diagnosis not present

## 2023-04-30 DIAGNOSIS — H2511 Age-related nuclear cataract, right eye: Secondary | ICD-10-CM | POA: Diagnosis not present

## 2023-05-06 DIAGNOSIS — H25812 Combined forms of age-related cataract, left eye: Secondary | ICD-10-CM | POA: Diagnosis not present

## 2023-05-20 DIAGNOSIS — H2511 Age-related nuclear cataract, right eye: Secondary | ICD-10-CM | POA: Diagnosis not present

## 2023-05-20 DIAGNOSIS — H25811 Combined forms of age-related cataract, right eye: Secondary | ICD-10-CM | POA: Diagnosis not present

## 2023-05-27 DIAGNOSIS — M545 Low back pain, unspecified: Secondary | ICD-10-CM | POA: Diagnosis not present

## 2023-05-27 DIAGNOSIS — M7918 Myalgia, other site: Secondary | ICD-10-CM | POA: Diagnosis not present

## 2023-05-28 ENCOUNTER — Other Ambulatory Visit: Payer: Self-pay | Admitting: Orthopedic Surgery

## 2023-05-28 DIAGNOSIS — I714 Abdominal aortic aneurysm, without rupture, unspecified: Secondary | ICD-10-CM

## 2023-05-29 ENCOUNTER — Other Ambulatory Visit: Payer: Medicare HMO

## 2023-05-29 DIAGNOSIS — I2510 Atherosclerotic heart disease of native coronary artery without angina pectoris: Secondary | ICD-10-CM | POA: Diagnosis not present

## 2023-05-29 DIAGNOSIS — I714 Abdominal aortic aneurysm, without rupture, unspecified: Secondary | ICD-10-CM

## 2023-05-29 DIAGNOSIS — E785 Hyperlipidemia, unspecified: Secondary | ICD-10-CM | POA: Diagnosis not present

## 2023-05-29 DIAGNOSIS — R0602 Shortness of breath: Secondary | ICD-10-CM | POA: Diagnosis not present

## 2023-06-05 ENCOUNTER — Other Ambulatory Visit: Payer: Medicare HMO

## 2023-06-18 DIAGNOSIS — M7542 Impingement syndrome of left shoulder: Secondary | ICD-10-CM | POA: Diagnosis not present

## 2023-07-05 DIAGNOSIS — R5383 Other fatigue: Secondary | ICD-10-CM | POA: Diagnosis not present

## 2023-07-05 DIAGNOSIS — R000 Tachycardia, unspecified: Secondary | ICD-10-CM | POA: Diagnosis not present

## 2023-07-05 DIAGNOSIS — R7301 Impaired fasting glucose: Secondary | ICD-10-CM | POA: Diagnosis not present

## 2023-07-05 DIAGNOSIS — I2510 Atherosclerotic heart disease of native coronary artery without angina pectoris: Secondary | ICD-10-CM | POA: Diagnosis not present

## 2023-07-05 DIAGNOSIS — K760 Fatty (change of) liver, not elsewhere classified: Secondary | ICD-10-CM | POA: Diagnosis not present

## 2023-07-05 DIAGNOSIS — E782 Mixed hyperlipidemia: Secondary | ICD-10-CM | POA: Diagnosis not present

## 2023-07-05 DIAGNOSIS — R42 Dizziness and giddiness: Secondary | ICD-10-CM | POA: Diagnosis not present

## 2023-07-05 DIAGNOSIS — R0602 Shortness of breath: Secondary | ICD-10-CM | POA: Diagnosis not present

## 2023-07-05 DIAGNOSIS — M109 Gout, unspecified: Secondary | ICD-10-CM | POA: Diagnosis not present

## 2023-07-16 ENCOUNTER — Telehealth: Payer: Self-pay | Admitting: Cardiovascular Disease

## 2023-07-16 NOTE — Telephone Encounter (Signed)
Pt returning nurse's call. Please advise

## 2023-07-16 NOTE — Telephone Encounter (Signed)
Left voicemail to return call to office.

## 2023-07-16 NOTE — Telephone Encounter (Signed)
  Per MyChart scheduling message:  Feeling weak, light headed, tire very quickly. Sudden onset 4 weeks ago. Same symtoms as Feb. 2023. No resolution then .Have seen primary care Meridee Score, [facility]. No clue, lots of blood tests, no indications. Feels similar to my post heart attack symptoms.

## 2023-07-17 NOTE — Telephone Encounter (Signed)
Spoke with pt who complains of a sudden change 1 months ago.  Pt complains of increased SOB on exertion, lightheadedness, weakness and fatigue.  Pt states his HR can go from 65 to 90 at times and symptoms will increase.  He denies current CP.  He states his BP runs in the 130-140's/70's.  He is taking medications as prescribed.  He has felt increasingly worse and recently saw his PCP who completed lab work but did not do an ECG.  PCP did not have a reason for pt's symptoms. Appt scheduled with Carolan Clines, DOD at [facility] office 07/18/2023.  Reviewed ED precautions. Pt verbalizes understanding and agrees with current plan.

## 2023-07-17 NOTE — Telephone Encounter (Signed)
Patient is getting frustrated because of not able to speak with someone about his mychart message

## 2023-07-18 ENCOUNTER — Encounter: Payer: Self-pay | Admitting: Internal Medicine

## 2023-07-18 ENCOUNTER — Ambulatory Visit: Payer: Medicare HMO | Attending: Internal Medicine | Admitting: Internal Medicine

## 2023-07-18 VITALS — BP 148/74 | HR 79 | Ht 67.0 in | Wt 225.4 lb

## 2023-07-18 DIAGNOSIS — R5383 Other fatigue: Secondary | ICD-10-CM

## 2023-07-18 DIAGNOSIS — I2510 Atherosclerotic heart disease of native coronary artery without angina pectoris: Secondary | ICD-10-CM | POA: Diagnosis not present

## 2023-07-18 DIAGNOSIS — R0602 Shortness of breath: Secondary | ICD-10-CM

## 2023-07-18 DIAGNOSIS — R42 Dizziness and giddiness: Secondary | ICD-10-CM

## 2023-07-18 NOTE — Patient Instructions (Signed)
Medication Instructions:  - NO CHANGES    *If you need a refill on your cardiac medications before your next appointment, please call your pharmacy*   Lab Work: NONE   If you have labs (blood work) drawn today and your tests are completely normal, you will receive your results only by: MyChart Message (if you have MyChart) OR A paper copy in the mail If you have any lab test that is abnormal or we need to change your treatment, we will call you to review the results.   Testing/Procedures:   Your doctor has scheduled you for a Myocardial Perfusion scan to obtain information about the blood flow to your heart. The test consists of taking pictures of your heart in two phases: while resting and after a stress test.  The stress test may involve walking on a treadmill, or if you are unable to exercise adequately, you will be given a drug intended to have a similar effect on the heart to that of exercise.  The test will take approximately 3 to 4  hours to complete.  If you are pregnant or breastfeeding,  please notify the staff prior to your test.  How to prepare for your test: Do not eat or drink 2 hours prior to your test Do not consume products containing caffeine 12 hours prior to your test (examples: coffee (regular OR decaf), chocolate, sodas, tea) Do bring a list of your current medications with you.  If you have held any meds in preparation for the test, please bring them, as you may be required to take them once the test is completed. Do wear comfortable clothes and walking shoes.  Do not wear dresses or overalls. Do NOT wear cologne, perfume, aftershave, or fragranced lotions the day of your test (deodorants okay). If these instructions are not followed your test will have to be rescheduled.   A nuclear cardiologist will review your test, prepare a report and send it to your physician.   If you have questions or concerns about your appointment, you can call the Nuclear Cardiology  department at [phone] x [phone]. If you cannot keep your appointment, please provide 48 hours notification to avoid a possible $50.00 charge to your account.   Please arrive 15 minutes prior to your appointment time for registration and insurance purposes     Follow-Up: At [facility], you and your health needs are our priority.  As part of our continuing mission to provide you with exceptional heart care, we have created designated Provider Care Teams.  These Care Teams include your primary Cardiologist (physician) and Advanced Practice Providers (APPs -  Physician Assistants and Nurse Practitioners) who all work together to provide you with the care you need, when you need it.  We recommend signing up for the patient portal called "MyChart".  Sign up information is provided on this After Visit Summary.  MyChart is used to connect with patients for Virtual Visits (Telemedicine).  Patients are able to view lab/test results, encounter notes, upcoming appointments, etc.  Non-urgent messages can be sent to your provider as well.   To learn more about what you can do with MyChart, go to [email or web address].    Your next appointment:   Follow up with Dr. Excell Seltzer at Jan 2025 appointment     Provider:   Tonny Bollman, MD    Other Instructions

## 2023-07-18 NOTE — Progress Notes (Signed)
Cardiology Office Note:  .   Date:  07/18/2023  ID:  Tauren, Elenes Sep 23, 1941, MRN [id number] PCP: Catha Gosselin, MD  [facility] Providers Cardiologist:  Tonny Bollman, MD    History of Present Illness: .   Timothy Wilcox is a 82 y.o. male he is a patient of Dr. Excell Seltzer and according to his prior note in January He has a hx of coronary artery disease and chronic shortness of breath, presenting for follow-up evaluation today.  He initially presented with an inferior STEMI in 2018 and was treated with a drug-eluting stent in the right coronary artery.  He could not tolerate ticagrelor because of shortness of breath. He was switched to clopidogrel with a good response. He then presented in 2019 with progressive dyspnea.  He underwent repeat cardiac catheterization as this was his presenting symptom before.  This demonstrated patency of his stent site but progressive and severe stenosis of the left circumflex.   He underwent uncomplicated stenting and continued on clopidogrel.  He continued to have symptoms of shortness of breath. He underwent extensive evaluation including VQ scan, CT pulmonary embolism study, echocardiography, and cardiopulmonary exercise testing.  He also underwent formal pulmonary evaluation.  No clear cause of his dyspnea was found.   He comes in today for a DOD visit.  Is once again complaining of shortness of breath on exertion.  He is also having weakness lightheadedness and fatigue.  He saw his primary care doctor who did not find any etiology to his symptoms.  His EKG today shows normal sinus rhythm without ischemic changes.    Timothy Wilcox, presents with a month-long history of fatigue, lack of energy, and shortness of breath upon exertion. The symptoms began abruptly one day while the patient was walking around the house. He felt weak, slightly dizzy, and collapsed into a chair, unable to lift his arms. He was panting for breath and felt unusually hungry.  The patient's blood pressure had dropped, and his pulse rate had increased. The patient's symptoms improved over a few days but recurred, albeit less severely. Over the past three weeks, the patient's lack of energy has been constant, with some days being worse than others. The patient also reports feeling slightly shaky and fuzzy at times, and his body seems to crave snacks more frequently. The patient compares his current symptoms to those experienced when he was on Brilinta and Plavix following his heart attack.       ROS:  per HPI otherwise negative   Studies Reviewed: Marland Kitchen        EKG Interpretation Date/Time:  Thursday July 18 2023 15:33:24 EDT Ventricular Rate:  79 PR Interval:  184 QRS Duration:  86 QT Interval:  378 QTC Calculation: 433 R Axis:   16  Text Interpretation: Normal sinus rhythm Nonspecific ST and T wave abnormality When compared with ECG of 19-Nov-2022 02:35, Nonspecific T wave abnormality no longer evident in Inferior leads Nonspecific T wave abnormality, worse in Lateral leads Confirmed by Carolan Clines ([id number]) on 07/18/2023 3:36:56 PM  Risk Assessment/Calculations:    Physical Exam:   VS:   Vitals:   07/18/23 1534  BP: (!) 148/74  Pulse: 79  SpO2: 97%   Wt Readings from Last 3 Encounters:  11/19/22 225 lb (102.1 kg)  10/30/22 228 lb 9.6 oz (103.7 kg)  10/19/21 226 lb 12.8 oz (102.9 kg)    GEN: Well nourished, well developed in no acute distress NECK: No JVD CARDIAC: RRR, no murmurs,  rubs, gallops RESPIRATORY:  Clear to auscultation without rales, wheezing or rhonchi  ABDOMEN: Soft, non-tender, non-distended EXTREMITIES:  No edema; No deformity   ASSESSMENT AND PLAN: .   Fatigue SOB Light headedness He has had long-term chronic history of shortness of breath.  He is not acutely decompensated today and well as no signs of ischemia. No arrhythmia.  He is concerned today because he notes that the symptoms were abrupt.  Will plan for an exercise stress  to assess his rhythm and blood pressure with exercise as well as assess for inducible ischemia. ---> exercise stress test  CAD - no change in GDMT      Informed Consent   Shared Decision Making/Informed Consent The risks [chest pain, shortness of breath, cardiac arrhythmias, dizziness, blood pressure fluctuations, myocardial infarction, stroke/transient ischemic attack, nausea, vomiting, allergic reaction, radiation exposure, metallic taste sensation and life-threatening complications (estimated to be 1 in 10,000)], benefits (risk stratification, diagnosing coronary artery disease, treatment guidance) and alternatives of a nuclear stress test were discussed in detail with Mr. Mcnevin and he agrees to proceed.       Signed, Maisie Fus, MD

## 2023-07-25 ENCOUNTER — Telehealth (HOSPITAL_COMMUNITY): Payer: Self-pay

## 2023-07-25 NOTE — Telephone Encounter (Signed)
Attempted to contact the patient, there are no working numbers. S.Bruchy Mikel CCT

## 2023-07-25 NOTE — Telephone Encounter (Signed)
Spoke with the patient, detailed instructions given. He stated that he would be here for his test. Asked to call back with any questions.S.Buena Boehm CCT

## 2023-07-30 ENCOUNTER — Ambulatory Visit (HOSPITAL_COMMUNITY): Payer: Medicare HMO | Attending: Cardiology

## 2023-07-30 DIAGNOSIS — R42 Dizziness and giddiness: Secondary | ICD-10-CM | POA: Diagnosis not present

## 2023-07-30 DIAGNOSIS — R0602 Shortness of breath: Secondary | ICD-10-CM

## 2023-07-30 DIAGNOSIS — R5383 Other fatigue: Secondary | ICD-10-CM | POA: Diagnosis not present

## 2023-07-30 DIAGNOSIS — I2510 Atherosclerotic heart disease of native coronary artery without angina pectoris: Secondary | ICD-10-CM | POA: Diagnosis not present

## 2023-07-30 MED ORDER — TECHNETIUM TC 99M TETROFOSMIN IV KIT
9.1000 | PACK | Freq: Once | INTRAVENOUS | Status: AC | PRN
  Administered 2023-07-30: 9.1 via INTRAVENOUS

## 2023-07-30 MED ORDER — TECHNETIUM TC 99M TETROFOSMIN IV KIT
26.3000 | PACK | Freq: Once | INTRAVENOUS | Status: AC | PRN
  Administered 2023-07-30: 26.3 via INTRAVENOUS

## 2023-07-31 LAB — MYOCARDIAL PERFUSION IMAGING
Estimated workload: 4.6
Exercise duration (min): 4 min
Exercise duration (sec): 0 s
LV dias vol: 68 mL (ref 62–150)
LV sys vol: 24 mL
MPHR: 140 {beats}/min
Nuc Stress EF: 64 %
Peak HR: 134 {beats}/min
Percent HR: 95 %
Rest HR: 73 {beats}/min
Rest Nuclear Isotope Dose: 9.1 mCi
SDS: 0
SRS: 0
SSS: 0
ST Depression (mm): 0 mm
Stress Nuclear Isotope Dose: 26.2 mCi
TID: 0.85

## 2023-08-09 DIAGNOSIS — D2271 Melanocytic nevi of right lower limb, including hip: Secondary | ICD-10-CM | POA: Diagnosis not present

## 2023-08-09 DIAGNOSIS — D1801 Hemangioma of skin and subcutaneous tissue: Secondary | ICD-10-CM | POA: Diagnosis not present

## 2023-08-09 DIAGNOSIS — D2262 Melanocytic nevi of left upper limb, including shoulder: Secondary | ICD-10-CM | POA: Diagnosis not present

## 2023-08-09 DIAGNOSIS — D692 Other nonthrombocytopenic purpura: Secondary | ICD-10-CM | POA: Diagnosis not present

## 2023-08-09 DIAGNOSIS — L814 Other melanin hyperpigmentation: Secondary | ICD-10-CM | POA: Diagnosis not present

## 2023-08-09 DIAGNOSIS — D2261 Melanocytic nevi of right upper limb, including shoulder: Secondary | ICD-10-CM | POA: Diagnosis not present

## 2023-08-09 DIAGNOSIS — Z85828 Personal history of other malignant neoplasm of skin: Secondary | ICD-10-CM | POA: Diagnosis not present

## 2023-08-09 DIAGNOSIS — L821 Other seborrheic keratosis: Secondary | ICD-10-CM | POA: Diagnosis not present

## 2023-08-09 DIAGNOSIS — D2272 Melanocytic nevi of left lower limb, including hip: Secondary | ICD-10-CM | POA: Diagnosis not present

## 2023-08-15 DIAGNOSIS — R1013 Epigastric pain: Secondary | ICD-10-CM | POA: Diagnosis not present

## 2023-08-15 DIAGNOSIS — M2550 Pain in unspecified joint: Secondary | ICD-10-CM | POA: Diagnosis not present

## 2023-08-15 DIAGNOSIS — I700 Atherosclerosis of aorta: Secondary | ICD-10-CM | POA: Diagnosis not present

## 2023-08-15 DIAGNOSIS — K760 Fatty (change of) liver, not elsewhere classified: Secondary | ICD-10-CM | POA: Diagnosis not present

## 2023-08-15 DIAGNOSIS — F1020 Alcohol dependence, uncomplicated: Secondary | ICD-10-CM | POA: Diagnosis not present

## 2023-08-15 DIAGNOSIS — G4700 Insomnia, unspecified: Secondary | ICD-10-CM | POA: Diagnosis not present

## 2023-08-15 DIAGNOSIS — I2510 Atherosclerotic heart disease of native coronary artery without angina pectoris: Secondary | ICD-10-CM | POA: Diagnosis not present

## 2023-08-15 DIAGNOSIS — R5383 Other fatigue: Secondary | ICD-10-CM | POA: Diagnosis not present

## 2023-08-15 DIAGNOSIS — R7301 Impaired fasting glucose: Secondary | ICD-10-CM | POA: Diagnosis not present

## 2023-08-15 DIAGNOSIS — R42 Dizziness and giddiness: Secondary | ICD-10-CM | POA: Diagnosis not present

## 2023-09-11 ENCOUNTER — Other Ambulatory Visit: Payer: Self-pay | Admitting: Family Medicine

## 2023-09-11 DIAGNOSIS — R748 Abnormal levels of other serum enzymes: Secondary | ICD-10-CM

## 2023-09-11 DIAGNOSIS — K760 Fatty (change of) liver, not elsewhere classified: Secondary | ICD-10-CM

## 2023-09-11 DIAGNOSIS — F1010 Alcohol abuse, uncomplicated: Secondary | ICD-10-CM

## 2023-09-11 DIAGNOSIS — E782 Mixed hyperlipidemia: Secondary | ICD-10-CM

## 2023-09-11 DIAGNOSIS — E161 Other hypoglycemia: Secondary | ICD-10-CM

## 2023-09-17 ENCOUNTER — Ambulatory Visit
Admission: RE | Admit: 2023-09-17 | Discharge: 2023-09-17 | Disposition: A | Discharge status: Home / Self Care | Payer: Medicare HMO | Source: Ambulatory Visit | Attending: Family Medicine | Admitting: Family Medicine

## 2023-09-17 DIAGNOSIS — F1010 Alcohol abuse, uncomplicated: Secondary | ICD-10-CM

## 2023-09-17 DIAGNOSIS — E782 Mixed hyperlipidemia: Secondary | ICD-10-CM | POA: Diagnosis not present

## 2023-09-17 DIAGNOSIS — K760 Fatty (change of) liver, not elsewhere classified: Secondary | ICD-10-CM | POA: Diagnosis not present

## 2023-09-17 DIAGNOSIS — R748 Abnormal levels of other serum enzymes: Secondary | ICD-10-CM

## 2023-09-17 DIAGNOSIS — R591 Generalized enlarged lymph nodes: Secondary | ICD-10-CM | POA: Diagnosis not present

## 2023-09-17 DIAGNOSIS — E161 Other hypoglycemia: Secondary | ICD-10-CM

## 2023-09-17 DIAGNOSIS — R140 Abdominal distension (gaseous): Secondary | ICD-10-CM | POA: Diagnosis not present

## 2023-09-17 MED ORDER — IOPAMIDOL (ISOVUE-300) INJECTION 61%
100.0000 mL | Freq: Once | INTRAVENOUS | Status: AC | PRN
  Administered 2023-09-17: 100 mL via INTRAVENOUS

## 2023-10-22 DIAGNOSIS — M545 Low back pain, unspecified: Secondary | ICD-10-CM | POA: Diagnosis not present

## 2023-10-31 ENCOUNTER — Ambulatory Visit: Payer: Medicare HMO | Attending: Cardiovascular Disease | Admitting: Cardiovascular Disease

## 2023-10-31 ENCOUNTER — Encounter: Payer: Self-pay | Admitting: Cardiovascular Disease

## 2023-10-31 VITALS — BP 114/68 | HR 82 | Ht 67.0 in | Wt 228.2 lb

## 2023-10-31 DIAGNOSIS — I10 Essential (primary) hypertension: Secondary | ICD-10-CM | POA: Diagnosis not present

## 2023-10-31 DIAGNOSIS — E782 Mixed hyperlipidemia: Secondary | ICD-10-CM | POA: Diagnosis not present

## 2023-10-31 DIAGNOSIS — I2510 Atherosclerotic heart disease of native coronary artery without angina pectoris: Secondary | ICD-10-CM | POA: Diagnosis not present

## 2023-10-31 DIAGNOSIS — I714 Abdominal aortic aneurysm, without rupture, unspecified: Secondary | ICD-10-CM

## 2023-10-31 NOTE — Patient Instructions (Signed)
Follow-Up: At [facility], you and your health needs are our priority.  As part of our continuing mission to provide you with exceptional heart care, we have created designated Provider Care Teams.  These Care Teams include your primary Cardiologist (physician) and Advanced Practice Providers (APPs -  Physician Assistants and Nurse Practitioners) who all work together to provide you with the care you need, when you need it.  We recommend signing up for the patient portal called "MyChart".  Sign up information is provided on this After Visit Summary.  MyChart is used to connect with patients for Virtual Visits (Telemedicine).  Patients are able to view lab/test results, encounter notes, upcoming appointments, etc.  Non-urgent messages can be sent to your provider as well.   To learn more about what you can do with MyChart, go to [email or web address].    Your next appointment:   1 year(s)  Provider:   Tonny Bollman, MD     Other Instructions   1st Floor: - Lobby - Registration  - Pharmacy  - Lab - Cafe  2nd Floor: - PV Lab - Diagnostic Testing (echo, CT, nuclear med)  3rd Floor: - Vacant  4th Floor: - TCTS (cardiothoracic surgery) - AFib Clinic - Structural Heart Clinic - Vascular Surgery  - Vascular Ultrasound  5th Floor: - HeartCare Cardiology (general and EP) - Clinical Pharmacy for coumadin, hypertension, lipid, weight-loss medications, and med management appointments    Valet parking services will be available as well.

## 2023-10-31 NOTE — Progress Notes (Signed)
Cardiology Office Note:    Date:  11/07/2023   ID:  Timothy Wilcox, Timothy Wilcox May 27, 1941, MRN [id number]  PCP:  Catha Gosselin, MD   [facility] Providers Cardiologist:  Tonny Bollman, MD     Referring MD: Catha Gosselin, MD   Chief Complaint  Patient presents with   Coronary Artery Disease    History of Present Illness:    Timothy Wilcox is a 83 y.o. male with a hx of coronary artery disease and chronic shortness of breath, presenting for follow-up evaluation today.  He initially presented with an inferior STEMI in 2018 and was treated with a drug-eluting stent in the right coronary artery.  He could not tolerate ticagrelor because of shortness of breath.  He was switched to clopidogrel with a good response.  He then presented in 2019 with progressive dyspnea.  He underwent repeat cardiac catheterization as this was his presenting symptom before.  This demonstrated patency of his stent site but progressive and severe stenosis of the left circumflex.  He underwent uncomplicated stenting and continued on clopidogrel.  He continued to have symptoms of shortness of breath. He underwent extensive evaluation including VQ scan, CT pulmonary embolism study, echocardiography, and cardiopulmonary exercise testing.  He also underwent formal pulmonary evaluation.  No clear cause of his dyspnea was found and a diet and exercise program was recommended. Since 2019 he has done well with no change in his symptoms.   He is here alone today, doing well. He was evaluated in October when he developed sudden onset of marked dizziness and fatigue, precipitating profound weakness for several days. He was evaluated with a stress Myoview scan showing normal LV function and no ischemia. He has improved and is feeling back to baseline. Breathing has returned to normal. Today, he denies symptoms of palpitations, chest pain, orthopnea, PND, lower extremity edema, dizziness, or syncope.   Current  Medications: Current Meds  Medication Sig   allopurinol (ZYLOPRIM) 300 MG tablet Take 300 mg by mouth daily.   amLODipine (NORVASC) 5 MG tablet Take 1 tablet (5 mg total) by mouth daily.   aspirin EC 81 MG tablet Take 81 mg by mouth daily.   COVID-19 mRNA vaccine 2023-2024 (COMIRNATY) syringe Inject into the muscle.   fluticasone (FLONASE) 50 MCG/ACT nasal spray Place 1 spray into both nostrils 2 (two) times daily.   ibuprofen (ADVIL,MOTRIN) 200 MG tablet Take 200 mg by mouth every 6 (six) hours as needed.   Lactobacillus Rhamnosus, GG, (CULTURELLE) CAPS 1 capsule   loratadine (CLARITIN) 10 MG tablet Take 10 mg by mouth 2 (two) times daily.    Multiple Vitamin (MULTIVITAMIN WITH MINERALS) TABS Take 1 tablet by mouth daily.   Wheat Dextrin (BENEFIBER PO) Take 1 tablet by mouth daily.   [DISCONTINUED] rosuvastatin (CRESTOR) 5 MG tablet TAKE ONE TABLET BY MOUTH DAILY     Allergies:   Brilinta [ticagrelor], Statins, Dicyclomine, Plavix [clopidogrel], and Tagamet [cimetidine]   ROS:   Please see the history of present illness.    All other systems reviewed and are negative.  EKGs/Labs/Other Studies Reviewed:    The following studies were reviewed today: Cardiac Studies & Procedures   CARDIAC CATHETERIZATION  CARDIAC CATHETERIZATION 02/27/2018  Narrative  Previously placed Dist RCA stent (unknown type) is widely patent.  Prox Cx lesion is 80% stenosed.  Post intervention, there is a 0% residual stenosis.  The left ventricular systolic function is normal.  A stent was successfully placed.  Preserved to low normal LV  function with minimal residual mid to basal inferior hypocontractility and an ejection fraction of 50%.  LVEDP 10 mmHg.  Coronary obstructive disease with a normal LAD, normal ramus intermediate vessel, progressive 80% proximal left circumflex stenosis, and widely patent previously placed distal RCA stent at the site of prior inferior STEMI.  Successful PCI to the  80% left circumflex stenosis with the patient participating in the Optimize Study stent protocol with ultimate insertion of a 3.0 x 13 mm sirolimus DES stent postdilated to 3.31 mm with the 80% stenosis being reduced to 0%.  RECOMMENDATION: Continue DAPT for at least a year.  Medical therapy.  The patient has intolerance to statins.  Consider Zetia.  Findings Coronary Findings Diagnostic  Dominance: Right  Left Circumflex Prox Cx lesion is 80% stenosed.  Right Coronary Artery Previously placed Dist RCA stent (unknown type) is widely patent.  Intervention  Prox Cx lesion Stent A stent was successfully placed. Post-Intervention Lesion Assessment The intervention was successful. Pre-interventional TIMI flow is 3. Post-intervention TIMI flow is 3. No complications occurred at this lesion. There is a 0% residual stenosis post intervention.   CARDIAC CATHETERIZATION  CARDIAC CATHETERIZATION 04/03/2017  Narrative 1. Acute inferoposterior STEMI treated successfully with primary PCI using a drug-eluting stent extending from the distal RCA into the posterior AV segment 2. Moderate left circumflex stenosis 3. Widely patent left main and LAD with minimal nonobstructive disease 4. Mild to moderate segmental LV dysfunction with severe hypokinesis of the inferior wall, LVEF estimated at 45-50%  Recommend:  Aspirin and brilinta 12 months. Brilinta 180 mg administered in the Cath Lab  Aggrastat 6 hours  If no complications arise consider hospital discharge within 48 hours  Aggressive risk reduction measures. Unfortunately patient is statin allergic  Findings Coronary Findings Diagnostic  Dominance: Right  Left Anterior Descending The vessel exhibits minimal luminal irregularities.  Left Circumflex The lesion is concentric.  Right Coronary Artery  Intervention  Dist RCA lesion Angioplasty Lesion crossed with guidewire. Pre-stent angioplasty was performed. A STENT PROMUS  PREM MR 2.75X16 drug eluting stent was successfully placed. Post-stent angioplasty was performed using a BALLOON SAPPHIRE [place] 3.0X10. Maximum pressure: 18 atm. There is no pre-interventional antegrade distal flow (TIMI 0).  The post-interventional distal flow is normal (TIMI 3). The intervention was successful . No complications occurred at this lesion. The patient has thrombotic occlusion of the distal RCA extending into the distal RCA bifurcation. Heparin and Aggrastat are both used for anticoagulation. A therapeutic ACT greater than 300 seconds is achieved. Initially, a cougar wire is advanced across the occlusion into a small PDA branch. That lesion is predilated with a 2.0 mm balloon. At that point it was apparent there was a larger twin PDA branch as well as the PLA branch supplied by this territory. A whisper wire is used and advanced into the more dominant PDA. That lesion is predilated with the same 2.0 mm balloon. This restored TIMI-3 flow into all branches. The lesion is then stented with a 2.75 x 16 mm Promus DES deployed at 14 atm. The stent is postdilated with a 3.0 mm noncompliant balloon to 18 atm. There was 0% residual stenosis with TIMI-3 flow in all 3 branches. There is a 0% residual stenosis post intervention.   STRESS TESTS  MYOCARDIAL PERFUSION IMAGING 07/30/2023  Narrative   LV perfusion is normal. There is no evidence of ischemia. There is no evidence of infarction. Reduced counts in the inferior segments on rest/stress imaging with normal wall motion consistent  with diaphragm attenuation.   Left ventricular function is normal. Nuclear stress EF: 64%. The left ventricular ejection fraction is normal (55-65%). End diastolic cavity size is normal.   Average exercise capacity (4:00 min:s; 4.6 METS). Normal HR/BP response to exercise.   The study is normal. The study is low risk.  ECHOCARDIOGRAM  ECHOCARDIOGRAM LIMITED BUBBLE STUDY 03/19/2018  Narrative *[place]* *[facility]* [address] [place], [place] [address] [phone]  ------------------------------------------------------------------- Transthoracic Echocardiography  Patient:    Felder, Lebeda MR #:       [id number] Study Date: 03/19/2018 Gender:     M Age:        4 Height:     170.2 cm Weight:     97.3 kg BSA:        2.18 m^2 Pt. Status: Room:       6E24C  ADMITTING    Tonny Bollman, MD ATTENDING    Tonny Bollman, MD PERFORMING   [facility], Inpatient ORDERING     Duayne Cal REFERRING    Duayne Cal SONOGRAPHER  Thurman Coyer  cc:  ------------------------------------------------------------------- LV EF: 55% -   60%  ------------------------------------------------------------------- Indications:      Dyspnea 786.09.  ------------------------------------------------------------------- History:   PMH:  Rule out cardiac, pulmonary, and hepatic shunting. Delayed bubble study please.  Coronary artery disease.  Risk factors:  Dyslipidemia.  ------------------------------------------------------------------- Study Conclusions  - Left ventricle: The cavity size was normal. Systolic function was normal. The estimated ejection fraction was in the range of 55% to 60%. Wall motion was normal; there were no regional wall motion abnormalities. The study is not technically sufficient to allow evaluation of LV diastolic function. - Mitral valve: Calcified annulus. Systolic bowing without prolapse. - Atrial septum: No defect or patent foramen ovale was identified. Echo contrast study showed no right-to-left atrial level shunt, at baseline or with provocation. No late shunting is seen.  ------------------------------------------------------------------- Study data:  Comparison was made to the study of 02/27/2018.  Study status:  Routine.  Procedure:  The patient reported no pain pre or post test. Transthoracic echocardiography. Image quality  was adequate. Intravenous contrast (agitated saline) was administered. Study completion:  There were no complications. Transthoracic echocardiography.  M-mode, limited 2D, limited spectral Doppler, and color Doppler.  Birthdate:  Patient birthdate: [birth year].  Age:  Patient is 83 yr old.  Sex:  Gender: male.    BMI: 33.6 kg/m^2.  Blood pressure:     124/92  Patient status:  Inpatient.  Study date:  Study date: 03/19/2018. Study time: 02:28 PM.  Location:  Echo laboratory.  -------------------------------------------------------------------  ------------------------------------------------------------------- Left ventricle:  The cavity size was normal. Systolic function was normal. The estimated ejection fraction was in the range of 55% to 60%. Wall motion was normal; there were no regional wall motion abnormalities. The study is not technically sufficient to allow evaluation of LV diastolic function.  ------------------------------------------------------------------- Aortic valve:   Trileaflet; mildly thickened leaflets. Mobility was not restricted. Sclerosis without stenosis.  Doppler:  There was no regurgitation.  ------------------------------------------------------------------- Aorta:  Aortic root: The aortic root was normal in size.  ------------------------------------------------------------------- Mitral valve:   Calcified annulus. Mobility was not restricted. Systolic bowing without prolapse.  Doppler:  There was no regurgitation.  ------------------------------------------------------------------- Left atrium:  The atrium was normal in size.  ------------------------------------------------------------------- Atrial septum:  No defect or patent foramen ovale was identified. Echo contrast study showed no right-to-left atrial level shunt, at baseline or with provocation. No late shunting is  seen.  ------------------------------------------------------------------- Right ventricle:  The cavity size was normal. Wall thickness was normal. Systolic function was normal.  ------------------------------------------------------------------- Pulmonic valve:   Poorly visualized.  ------------------------------------------------------------------- Tricuspid valve:   Structurally normal valve.    Doppler:  There was no regurgitation.  ------------------------------------------------------------------- Pulmonary artery:   The main pulmonary artery was normal-sized. Systolic pressure was within the normal range.  ------------------------------------------------------------------- Right atrium:  The atrium was normal in size.  ------------------------------------------------------------------- Pericardium:  There was no pericardial effusion.  ------------------------------------------------------------------- Systemic veins: Inferior vena cava: The vessel was normal in size.  ------------------------------------------------------------------- Prepared and Electronically Authenticated by  Thurmon Fair, MD 2019-06-12T16:23:29             EKG:        Recent Labs: 11/19/2022: ALT 36; BUN 18; Creatinine, Ser 1.19; Hemoglobin 15.9; Platelets PLATELET CLUMPS NOTED ON SMEAR, COUNT APPEARS ADEQUATE; Potassium 4.4; Sodium 140  Recent Lipid Panel    Component Value Date/Time   CHOL 122 10/26/2020 0808   TRIG 124 10/26/2020 0808   HDL 66 10/26/2020 0808   CHOLHDL 1.8 10/26/2020 0808   CHOLHDL 3.3 02/27/2018 0220   VLDL 42 (H) 02/27/2018 0220   LDLCALC 35 10/26/2020 0808             Physical Exam:    VS:  BP 114/68   Pulse 82   Ht 5\' 7"  (1.702 m)   Wt 228 lb 3.2 oz (103.5 kg)   SpO2 97%   BMI 35.74 kg/m     Wt Readings from Last 3 Encounters:  10/31/23 228 lb 3.2 oz (103.5 kg)  07/18/23 225 lb 6.4 oz (102.2 kg)  11/19/22 225 lb (102.1 kg)     GEN:  Well  nourished, well developed in no acute distress HEENT: Normal NECK: No JVD; No carotid bruits LYMPHATICS: No lymphadenopathy CARDIAC: RRR, no murmurs, rubs, gallops RESPIRATORY:  Clear to auscultation without rales, wheezing or rhonchi  ABDOMEN: Soft, non-tender, non-distended MUSCULOSKELETAL:  No edema; No deformity  SKIN: Warm and dry NEUROLOGIC:  Alert and oriented x 3 PSYCHIATRIC:  Normal affect   Assessment & Plan Coronary artery disease involving native coronary artery of native heart without angina pectoris Recent stress test 07/30/2023 reviewed and shows normal LVEF and no perfusion abnormalities.  He is having no anginal symptoms.  He will continue on amlodipine, aspirin, and rosuvastatin. Abdominal aortic aneurysm (AAA) without rupture, unspecified part (HCC) Reviewed most recent duplex ultrasound study which showed bilobed aortic aneurysm measuring up to 3.5 x 3.7 cm, not significantly changed from prior study.  AAA measures 3.4 cm by CT done in December - reviewed. Continue annual surveillance. Mixed hyperlipidemia Treated with rosuvastatin. LDL cholesterol 43. Continue current Rx.  Essential hypertension BP well-controlled on current Rx. No changes made today to his medications.             Medication Adjustments/Labs and Tests Ordered: Current medicines are reviewed at length with the patient today.  Concerns regarding medicines are outlined above.  No orders of the defined types were placed in this encounter.  No orders of the defined types were placed in this encounter.   Patient Instructions  Follow-Up: At [facility], you and your health needs are our priority.  As part of our continuing mission to provide you with exceptional heart care, we have created designated Provider Care Teams.  These Care Teams include your primary Cardiologist (physician) and Advanced Practice Providers (APPs -  Physician Assistants and Nurse Practitioners) who all work  together to provide you with the care you need, when you need it.  We recommend signing up for the patient portal called "MyChart".  Sign up information is provided on this After Visit Summary.  MyChart is used to connect with patients for Virtual Visits (Telemedicine).  Patients are able to view lab/test results, encounter notes, upcoming appointments, etc.  Non-urgent messages can be sent to your provider as well.   To learn more about what you can do with MyChart, go to [email or web address].    Your next appointment:   1 year(s)  Provider:   Tonny Bollman, MD     Other Instructions   1st Floor: - Lobby - Registration  - Pharmacy  - Lab - Cafe  2nd Floor: - PV Lab - Diagnostic Testing (echo, CT, nuclear med)  3rd Floor: - Vacant  4th Floor: - TCTS (cardiothoracic surgery) - AFib Clinic - Structural Heart Clinic - Vascular Surgery  - Vascular Ultrasound  5th Floor: - HeartCare Cardiology (general and EP) - Clinical Pharmacy for coumadin, hypertension, lipid, weight-loss medications, and med management appointments    Valet parking services will be available as well.          Signed, Tonny Bollman, MD  11/07/2023 6:31 AM    [facility]

## 2023-11-05 ENCOUNTER — Other Ambulatory Visit: Payer: Self-pay | Admitting: Cardiovascular Disease

## 2023-11-05 DIAGNOSIS — E785 Hyperlipidemia, unspecified: Secondary | ICD-10-CM

## 2023-11-05 DIAGNOSIS — I10 Essential (primary) hypertension: Secondary | ICD-10-CM | POA: Diagnosis not present

## 2023-11-05 DIAGNOSIS — Z6836 Body mass index (BMI) 36.0-36.9, adult: Secondary | ICD-10-CM | POA: Diagnosis not present

## 2023-11-05 DIAGNOSIS — E782 Mixed hyperlipidemia: Secondary | ICD-10-CM | POA: Diagnosis not present

## 2023-11-05 DIAGNOSIS — K869 Disease of pancreas, unspecified: Secondary | ICD-10-CM | POA: Diagnosis not present

## 2023-11-07 ENCOUNTER — Encounter: Payer: Self-pay | Admitting: Cardiovascular Disease

## 2023-11-07 DIAGNOSIS — J309 Allergic rhinitis, unspecified: Secondary | ICD-10-CM | POA: Diagnosis not present

## 2023-11-07 DIAGNOSIS — G4733 Obstructive sleep apnea (adult) (pediatric): Secondary | ICD-10-CM | POA: Diagnosis not present

## 2023-11-07 NOTE — Assessment & Plan Note (Signed)
Treated with rosuvastatin. LDL cholesterol 43. Continue current Rx.

## 2023-11-07 NOTE — Assessment & Plan Note (Signed)
Recent stress test 07/30/2023 reviewed and shows normal LVEF and no perfusion abnormalities.  He is having no anginal symptoms.  He will continue on amlodipine, aspirin, and rosuvastatin.

## 2023-11-13 ENCOUNTER — Other Ambulatory Visit: Payer: Self-pay | Admitting: Cardiovascular Disease

## 2023-11-13 DIAGNOSIS — I2510 Atherosclerotic heart disease of native coronary artery without angina pectoris: Secondary | ICD-10-CM

## 2023-11-13 DIAGNOSIS — I10 Essential (primary) hypertension: Secondary | ICD-10-CM

## 2023-11-18 ENCOUNTER — Encounter: Payer: Self-pay | Admitting: Cardiovascular Disease

## 2023-11-29 ENCOUNTER — Inpatient Hospital Stay (HOSPITAL_COMMUNITY)
Admission: EM | Admit: 2023-11-29 | Discharge: 2023-12-01 | DRG: 175 | Disposition: A | Discharge status: Home / Self Care | Payer: Medicare HMO | Attending: Internal Medicine | Admitting: Internal Medicine

## 2023-11-29 ENCOUNTER — Other Ambulatory Visit (HOSPITAL_COMMUNITY): Payer: Medicare HMO

## 2023-11-29 ENCOUNTER — Other Ambulatory Visit: Payer: Self-pay

## 2023-11-29 ENCOUNTER — Encounter (HOSPITAL_COMMUNITY): Payer: Self-pay | Admitting: Family Medicine

## 2023-11-29 ENCOUNTER — Emergency Department (HOSPITAL_COMMUNITY): Payer: Medicare HMO

## 2023-11-29 DIAGNOSIS — I10 Essential (primary) hypertension: Secondary | ICD-10-CM | POA: Diagnosis not present

## 2023-11-29 DIAGNOSIS — Z86711 Personal history of pulmonary embolism: Secondary | ICD-10-CM | POA: Diagnosis present

## 2023-11-29 DIAGNOSIS — G4733 Obstructive sleep apnea (adult) (pediatric): Secondary | ICD-10-CM | POA: Diagnosis not present

## 2023-11-29 DIAGNOSIS — R531 Weakness: Secondary | ICD-10-CM | POA: Diagnosis not present

## 2023-11-29 DIAGNOSIS — Z87442 Personal history of urinary calculi: Secondary | ICD-10-CM

## 2023-11-29 DIAGNOSIS — G4730 Sleep apnea, unspecified: Secondary | ICD-10-CM | POA: Diagnosis present

## 2023-11-29 DIAGNOSIS — Z7982 Long term (current) use of aspirin: Secondary | ICD-10-CM

## 2023-11-29 DIAGNOSIS — R079 Chest pain, unspecified: Secondary | ICD-10-CM | POA: Diagnosis not present

## 2023-11-29 DIAGNOSIS — Z888 Allergy status to other drugs, medicaments and biological substances status: Secondary | ICD-10-CM

## 2023-11-29 DIAGNOSIS — I700 Atherosclerosis of aorta: Secondary | ICD-10-CM | POA: Diagnosis not present

## 2023-11-29 DIAGNOSIS — R319 Hematuria, unspecified: Secondary | ICD-10-CM

## 2023-11-29 DIAGNOSIS — I2510 Atherosclerotic heart disease of native coronary artery without angina pectoris: Secondary | ICD-10-CM | POA: Diagnosis present

## 2023-11-29 DIAGNOSIS — R918 Other nonspecific abnormal finding of lung field: Secondary | ICD-10-CM | POA: Diagnosis present

## 2023-11-29 DIAGNOSIS — I2699 Other pulmonary embolism without acute cor pulmonale: Secondary | ICD-10-CM | POA: Diagnosis present

## 2023-11-29 DIAGNOSIS — Z800 Family history of malignant neoplasm of digestive organs: Secondary | ICD-10-CM

## 2023-11-29 DIAGNOSIS — I252 Old myocardial infarction: Secondary | ICD-10-CM | POA: Diagnosis not present

## 2023-11-29 DIAGNOSIS — R0600 Dyspnea, unspecified: Secondary | ICD-10-CM | POA: Diagnosis not present

## 2023-11-29 DIAGNOSIS — Z955 Presence of coronary angioplasty implant and graft: Secondary | ICD-10-CM

## 2023-11-29 DIAGNOSIS — E785 Hyperlipidemia, unspecified: Secondary | ICD-10-CM | POA: Diagnosis present

## 2023-11-29 DIAGNOSIS — Z743 Need for continuous supervision: Secondary | ICD-10-CM | POA: Diagnosis not present

## 2023-11-29 DIAGNOSIS — Z79899 Other long term (current) drug therapy: Secondary | ICD-10-CM

## 2023-11-29 DIAGNOSIS — I2609 Other pulmonary embolism with acute cor pulmonale: Secondary | ICD-10-CM | POA: Diagnosis not present

## 2023-11-29 DIAGNOSIS — R0689 Other abnormalities of breathing: Secondary | ICD-10-CM | POA: Diagnosis not present

## 2023-11-29 DIAGNOSIS — Z7901 Long term (current) use of anticoagulants: Secondary | ICD-10-CM

## 2023-11-29 DIAGNOSIS — J9601 Acute respiratory failure with hypoxia: Secondary | ICD-10-CM | POA: Diagnosis present

## 2023-11-29 DIAGNOSIS — R5383 Other fatigue: Secondary | ICD-10-CM | POA: Diagnosis not present

## 2023-11-29 DIAGNOSIS — I82403 Acute embolism and thrombosis of unspecified deep veins of lower extremity, bilateral: Secondary | ICD-10-CM | POA: Diagnosis present

## 2023-11-29 DIAGNOSIS — R42 Dizziness and giddiness: Secondary | ICD-10-CM | POA: Diagnosis not present

## 2023-11-29 DIAGNOSIS — Z808 Family history of malignant neoplasm of other organs or systems: Secondary | ICD-10-CM

## 2023-11-29 LAB — BASIC METABOLIC PANEL
Anion gap: 16 — ABNORMAL HIGH (ref 5–15)
BUN: 14 mg/dL (ref 8–23)
CO2: 18 mmol/L — ABNORMAL LOW (ref 22–32)
Calcium: 9.7 mg/dL (ref 8.9–10.3)
Chloride: 104 mmol/L (ref 98–111)
Creatinine, Ser: 1.07 mg/dL (ref 0.61–1.24)
GFR, Estimated: >60 mL/min (ref >60)
Glucose, Bld: 106 mg/dL — ABNORMAL HIGH (ref 70–99)
Potassium: 5 mmol/L (ref 3.5–5.1)
Sodium: 138 mmol/L (ref 135–145)

## 2023-11-29 LAB — CBC
HCT: 46.4 % (ref 39.0–52.0)
Hemoglobin: 15.9 g/dL (ref 13.0–17.0)
MCH: 32.8 pg (ref 26.0–34.0)
MCHC: 34.3 g/dL (ref 30.0–36.0)
MCV: 95.7 fL (ref 80.0–100.0)
Platelets: 216 10*3/uL (ref 150–400)
RBC: 4.85 MIL/uL (ref 4.22–5.81)
RDW: 13 % (ref 11.5–15.5)
WBC: 11.5 10*3/uL — ABNORMAL HIGH (ref 4.0–10.5)
nRBC: 0 % (ref 0.0–0.2)

## 2023-11-29 LAB — RESP PANEL BY RT-PCR (RSV, FLU A&B, COVID)  RVPGX2
Influenza A by PCR: NEGATIVE
Influenza B by PCR: NEGATIVE
Resp Syncytial Virus by PCR: NEGATIVE
SARS Coronavirus 2 by RT PCR: NEGATIVE

## 2023-11-29 LAB — TROPONIN I (HIGH SENSITIVITY): Troponin I (High Sensitivity): 26 ng/L — ABNORMAL HIGH (ref <18)

## 2023-11-29 LAB — CBG MONITORING, ED: Glucose-Capillary: 100 mg/dL — ABNORMAL HIGH (ref 70–99)

## 2023-11-29 MED ORDER — ACETAMINOPHEN 325 MG PO TABS: 650.0000 mg | ORAL_TABLET | Freq: Four times a day (QID) | ORAL | Status: DC | PRN

## 2023-11-29 MED ORDER — HEPARIN BOLUS VIA INFUSION
5500.0000 [IU] | Freq: Once | INTRAVENOUS | Status: AC
  Administered 2023-11-30: 5500 [IU] via INTRAVENOUS
  Filled 2023-11-29: qty 5500

## 2023-11-29 MED ORDER — POLYETHYLENE GLYCOL 3350 17 G PO PACK: 17.0000 g | PACK | Freq: Every day | ORAL | Status: DC | PRN

## 2023-11-29 MED ORDER — ALLOPURINOL 300 MG PO TABS
300.0000 mg | ORAL_TABLET | Freq: Every day | ORAL | Status: DC
  Administered 2023-11-30 – 2023-12-01 (×2): 300 mg via ORAL
  Filled 2023-11-29: qty 3
  Filled 2023-11-29: qty 1

## 2023-11-29 MED ORDER — HEPARIN (PORCINE) 25000 UT/250ML-% IV SOLN
1350.0000 [IU]/h | INTRAVENOUS | Status: DC
  Administered 2023-11-30: 1500 [IU]/h via INTRAVENOUS
  Administered 2023-11-30: 1350 [IU]/h via INTRAVENOUS
  Filled 2023-11-29 (×2): qty 250

## 2023-11-29 MED ORDER — ONDANSETRON HCL 4 MG/2ML IJ SOLN: 4.0000 mg | Freq: Four times a day (QID) | INTRAMUSCULAR | Status: DC | PRN

## 2023-11-29 MED ORDER — SODIUM CHLORIDE 0.9% FLUSH
3.0000 mL | Freq: Two times a day (BID) | INTRAVENOUS | Status: DC
  Administered 2023-11-30 – 2023-12-01 (×4): 3 mL via INTRAVENOUS

## 2023-11-29 MED ORDER — ROSUVASTATIN CALCIUM 5 MG PO TABS
5.0000 mg | ORAL_TABLET | Freq: Every day | ORAL | Status: DC
  Administered 2023-11-30 (×2): 5 mg via ORAL
  Filled 2023-11-29 (×2): qty 1

## 2023-11-29 MED ORDER — ACETAMINOPHEN 650 MG RE SUPP: 650.0000 mg | Freq: Four times a day (QID) | RECTAL | Status: DC | PRN

## 2023-11-29 MED ORDER — ONDANSETRON HCL 4 MG PO TABS: 4.0000 mg | ORAL_TABLET | Freq: Four times a day (QID) | ORAL | Status: DC | PRN

## 2023-11-29 MED ORDER — OXYCODONE HCL 5 MG PO TABS: 5.0000 mg | ORAL_TABLET | ORAL | Status: DC | PRN

## 2023-11-29 MED ORDER — IOHEXOL 350 MG/ML SOLN
75.0000 mL | Freq: Once | INTRAVENOUS | Status: AC | PRN
  Administered 2023-11-29: 75 mL via INTRAVENOUS

## 2023-11-29 MED ORDER — AMLODIPINE BESYLATE 5 MG PO TABS
5.0000 mg | ORAL_TABLET | Freq: Every day | ORAL | Status: DC
  Administered 2023-11-30 – 2023-12-01 (×2): 5 mg via ORAL
  Filled 2023-11-29 (×2): qty 1

## 2023-11-29 NOTE — ED Provider Notes (Signed)
 [place] EMERGENCY DEPARTMENT AT [facility] Provider Note   CSN: [id number] Arrival date & time: 11/29/23  1804     History  Chief Complaint  Patient presents with   Chest Pain    Timothy Wilcox is a 83 y.o. male with history of CAD with STEMI in 2018 status post stenting of the right coronary artery, stent in the left circumflex, on clopidogrel.  Presenting today for evaluation after 30 minutes of left-sided squeezing chest pain that woke him from sleep around 5:00 this morning, resolved around 530.  Patient has had chronic shortness of breath for the last several months with repeat imaging and extensive workup in the outpatient setting all inconclusive for etiology of the patient's shortness of breath.  No recent travel, prolonged immobilization, history of smoking, history of malignancy, or known familial clotting disorder.  History of OSA on CPAP, hyperlipidemia, gout.  He is not on anticoagulation but is on antiplatelet therapy with aspirin.  Per patient allergic to Brilinta, Plavix, and Tagamet.  HPI     Home Medications Prior to Admission medications   Medication Sig Start Date End Date Taking? Authorizing Provider  allopurinol (ZYLOPRIM) 300 MG tablet Take 300 mg by mouth daily.   Yes [provider]  amLODipine (NORVASC) 5 MG tablet TAKE 1 TABLET BY MOUTH DAILY 11/14/23  Yes Tonny Bollman, MD  aspirin EC 81 MG tablet Take 81 mg by mouth daily.   Yes [provider]  fluticasone (FLONASE) 50 MCG/ACT nasal spray Place 1 spray into both nostrils 2 (two) times daily. May use 2 sprays during active allergy season.   Yes [provider]  ibuprofen (ADVIL,MOTRIN) 200 MG tablet Take 400 mg by mouth every 6 (six) hours as needed for mild pain (pain score 1-3) (lower back pain).   Yes [provider]  Lactobacillus Rhamnosus, GG, (CULTURELLE) CAPS 1 capsule   Yes [provider]  rosuvastatin (CRESTOR) 5 MG tablet TAKE 1  TABLET BY MOUTH DAILY Patient taking differently: Take 5 mg by mouth at bedtime. 11/06/23  Yes Tonny Bollman, MD  tacrolimus (PROTOPIC) 0.1 % ointment Apply 1 Application topically daily.   Yes [provider]  ketoconazole (NIZORAL) 2 % shampoo 5 ml Externally   Yes [provider]  loratadine (CLARITIN) 10 MG tablet Take 10 mg by mouth daily.   Yes [provider]  Multiple Vitamin (MULTIVITAMIN WITH MINERALS) TABS Take 1 tablet by mouth daily.   Yes [provider]  Wheat Dextrin (BENEFIBER) POWD Take 1 Dose by mouth daily.   Yes [provider]      Allergies    Brilinta [ticagrelor], Statins, Clotrimazole-betamethasone, Dicyclomine, Plavix [clopidogrel], Potassium citrate, Tagamet [cimetidine], Tizanidine hcl, and Zyrtec [cetirizine hcl]    Review of Systems   Review of Systems  Respiratory:  Positive for shortness of breath.   Cardiovascular:  Positive for chest pain.    Physical Exam Updated Vital Signs BP 126/70 (BP Location: Right Arm)   Pulse 77   Temp 97.9 F (36.6 C) (Oral)   Resp (!) 22   Ht 5\' 7"  (1.702 m)   Wt 105 kg   SpO2 97%   BMI 36.24 kg/m  Physical Exam Vitals and nursing note reviewed.  Constitutional:      Appearance: He is obese. He is not ill-appearing or toxic-appearing.  HENT:     Head: Normocephalic and atraumatic.     Mouth/Throat:     Mouth: Mucous membranes are moist.  Pharynx: No oropharyngeal exudate or posterior oropharyngeal erythema.  Eyes:     General:        Right eye: No discharge.        Left eye: No discharge.     Conjunctiva/sclera: Conjunctivae normal.  Cardiovascular:     Rate and Rhythm: Normal rate and regular rhythm.     Pulses: Normal pulses.     Heart sounds: Normal heart sounds.  Pulmonary:     Effort: Tachypnea and accessory muscle usage present. No respiratory distress.     Breath sounds: Normal breath sounds. No wheezing or rales.     Comments: Dyspnea with speech,  O2 of 93% on RA.  Chest:     Chest wall: No mass, tenderness or edema.  Abdominal:     General: Bowel sounds are normal. There is no distension.     Palpations: Abdomen is soft.     Tenderness: There is no abdominal tenderness.  Musculoskeletal:        General: No deformity.     Cervical back: Neck supple.     Right lower leg: No edema.     Left lower leg: No edema.  Skin:    General: Skin is warm and dry.     Capillary Refill: Capillary refill takes less than 2 seconds.  Neurological:     General: No focal deficit present.     Mental Status: He is alert and oriented to person, place, and time. Mental status is at baseline.  Psychiatric:        Mood and Affect: Mood normal.     ED Results / Procedures / Treatments   Labs (all labs ordered are listed, but only abnormal results are displayed) Labs Reviewed  BASIC METABOLIC PANEL - Abnormal; Notable for the following components:      Result Value   CO2 18 (*)    Glucose, Bld 106 (*)    Anion gap 16 (*)    All other components within normal limits  CBC - Abnormal; Notable for the following components:   WBC 11.5 (*)    All other components within normal limits  CBG MONITORING, ED - Abnormal; Notable for the following components:   Glucose-Capillary 100 (*)    All other components within normal limits  TROPONIN I (HIGH SENSITIVITY) - Abnormal; Notable for the following components:   Troponin I (High Sensitivity) 26 (*)    All other components within normal limits  RESP PANEL BY RT-PCR (RSV, FLU A&B, COVID)  RVPGX2  PROTIME-INR  HEPATIC FUNCTION PANEL  HEPARIN LEVEL (UNFRACTIONATED)  TROPONIN I (HIGH SENSITIVITY)    EKG None  Radiology CT Angio Chest PE W/Cm &/Or Wo Cm Result Date: 11/29/2023 CLINICAL DATA:  Chest pain.  Concern for pulmonary embolism. EXAM: CT ANGIOGRAPHY CHEST WITH CONTRAST TECHNIQUE: Multidetector CT imaging of the chest was performed using the standard protocol during bolus administration of  intravenous contrast. Multiplanar CT image reconstructions and MIPs were obtained to evaluate the vascular anatomy. RADIATION DOSE REDUCTION: This exam was performed according to the departmental dose-optimization program which includes automated exposure control, adjustment of the mA and/or kV according to patient size and/or use of iterative reconstruction technique. CONTRAST:  75mL OMNIPAQUE IOHEXOL 350 MG/ML SOLN COMPARISON:  03/18/2018. FINDINGS: Cardiovascular: The heart is enlarged and there is no pericardial effusion. Multi-vessel coronary artery calcifications are noted. There is atherosclerotic calcification of the aorta without evidence of aneurysm. The pulmonary trunk is normal in caliber. Small segmental and subsegmental pulmonary emboli  are noted in the left upper lobe, best seen on sagittal image 130. There is dilatation of the right ventricle, which may be associated with underlying right heart strain. Mediastinum/Nodes: No mediastinal, hilar, or axillary lymphadenopathy. Calcified lymph nodes are noted at the mediastinum and right hilum. The thyroid gland, trachea, and esophagus are within normal limits. There is a small hiatal hernia. Lungs/Pleura: No consolidation, effusion or pneumothorax is seen. Scattered pulmonary nodules are present bilaterally measuring up to 1.4 cm in the right lower lobe. Upper Abdomen: No acute abnormality. Musculoskeletal: Degenerative changes are present in the thoracic spine. No acute or suspicious osseous abnormality is seen. Review of the MIP images confirms the above findings. IMPRESSION: 1. Small segmental and subsegmental pulmonary emboli in the left upper lobe. There is dilatation of the right ventricle which may be associated with right heart strain. 2. Multiple pulmonary nodules bilaterally measuring up to 1.4 cm, concerning for neoplastic process, less likely infectious or inflammatory. Non-contrast chest CT at 3-6 months is recommended. If the nodules are  stable at time of repeat CT, then future CT at 18-24 months (from today's scan) is considered optional for low-risk patients, but is recommended for high-risk patients. This recommendation follows the consensus statement: Guidelines for Management of Incidental Pulmonary Nodules Detected on CT Images: From the Fleischner Society 2017; Radiology 2017; 284:228-243. 3. Coronary artery calcifications. 4. Small hiatal hernia. 5. Aortic atherosclerosis. Critical Value/emergent results were called by telephone at the time of interpretation on 11/29/2023 at 9:16 pm to provider [facility] , who verbally acknowledged these results. Electronically Signed   By: Thornell Sartorius M.D.   On: 11/29/2023 21:16   DG Chest 2 View Result Date: 11/29/2023 CLINICAL DATA:  Chest pain EXAM: CHEST - 2 VIEW COMPARISON:  03/19/2018 FINDINGS: Heart normal size no mediastinal contours within normal limits. Aortic atherosclerosis. No confluent opacities or effusions. No acute bony abnormality. IMPRESSION: No active cardiopulmonary disease. Electronically Signed   By: Charlett Nose M.D.   On: 11/29/2023 19:13    Procedures .Critical Care  Performed by: Paris Lore, PA-C Authorized by: Paris Lore, PA-C   Critical care provider statement:    Critical care time (minutes):  30   Critical care was time spent personally by me on the following activities:  Development of treatment plan with patient or surrogate, discussions with consultants, evaluation of patient's response to treatment, examination of patient, ordering and review of laboratory studies, ordering and review of radiographic studies, ordering and performing treatments and interventions, pulse oximetry, re-evaluation of patient's condition and review of old charts     Medications Ordered in ED Medications  heparin ADULT infusion 100 units/mL (25000 units/249mL) (has no administration in time range)  heparin bolus via infusion 5,500 Units (has no  administration in time range)  iohexol (OMNIPAQUE) 350 MG/ML injection 75 mL (75 mLs Intravenous Contrast Given 11/29/23 2054)    ED Course/ Medical Decision Making/ A&P Clinical Course as of 11/29/23 2344  Fri Nov 29, 2023  2342 Consult to Dr. Antionette Char, hospitalist, who is agreeable to admitting this patient to his service. I appreciate his collaboration in the care of this patient.  [RS]    Clinical Course User Index [RS] Stephanye Finnicum, Eugene Gavia, PA-C                                 Medical Decision Making 83 y/o male with hx of CAD who presents with  transient left chest pain earlier in the day with associated shortness of breath.  Mildly hypertensive on intake, oxygen saturation low normal 93% on room air, patient with some dyspnea with speech, mild tachypnea, no wheezing or adventitious lung sounds.  No lower extremity edema.  Dx close limited to ACS, PE, dysrhythmia, pneumonia, pneumothorax, pleural effusion, CHF.  Amount and/or Complexity of Data Reviewed Labs: ordered.    Details: CBC with leukocytosis of 11.5, BMP with mildly elevated anion gap of 16.  RVP negative, Trope 26 Radiology: ordered.    Details: Chest x-ray negative for acute cardiopulmonary disease, PE study with small segmental and subsegmental PE in the left upper lobe with dilatation of the right ventricle concerning for right heart strain.  Additionally multiple pulmonary nodules bilaterally largest of which is 1.4 cm concerning for neoplasm.   Risk Prescription drug management. Decision regarding hospitalization.   Patient will require mission the hospital for heparinization and further workup and stabilization with new PEs.  Will require formal echocardiogram in the inpatient setting as well further investigation regarding new pulmonary nodules.   Tim  voiced understanding of he medical evaluation and treatment plan. Each of their questions answered to their expressed satisfaction.  He is amenable to plan for  admission at this time. Consult to hospitalist as above.   This chart was dictated using voice recognition software, Dragon. Despite the best efforts of this provider to proofread and correct errors, errors may still occur which can change documentation meaning.         Final Clinical Impression(s) / ED Diagnoses Final diagnoses:  Other acute pulmonary embolism with acute cor pulmonale [facility])    Rx / DC Orders ED Discharge Orders     None         Paris Lore, PA-C 11/29/23 2344    Royanne Foots, DO 12/01/23 1216

## 2023-11-29 NOTE — Progress Notes (Signed)
ANTICOAGULATION CONSULT NOTE  Pharmacy Consult for Heparin Indication: pulmonary embolus  Allergies  Allergen Reactions   Brilinta [Ticagrelor] Shortness Of Breath and Other (See Comments)    Dizziness, weakness/Caused hospital admission   Statins Other (See Comments)    Pain in joints, cant move knees   Dicyclomine Other (See Comments)    Interfered with sleep,nervous   Plavix [Clopidogrel]     SHORT OF BREATH, FATIQUE   Tagamet [Cimetidine] Other (See Comments)    Gynecomastia     Patient Measurements: Height: 5\' 7"  (170.2 cm) Weight: 105 kg (231 lb 6.4 oz) IBW/kg (Calculated) : 66.1 Heparin Dosing Weight: 89.3 kg  Vital Signs: Temp: 98.8 F (37.1 C) (02/21 1810) Temp Source: Oral (02/21 1810) BP: 154/75 (02/21 1810) Pulse Rate: 74 (02/21 1810)  Labs: Recent Labs    11/29/23 1838  HGB 15.9  HCT 46.4  PLT 216  CREATININE 1.07  TROPONINIHS 26*    Estimated Creatinine Clearance: 63.6 mL/min (by C-G formula based on SCr of 1.07 mg/dL).   Medical History: Past Medical History:  Diagnosis Date   Arthritis    CAD (coronary artery disease) 02/26/2018   A. Inf STEMI 6/18: LHC - pLCx 60, dRCA 100, EF 45-50 >> PCI:  DES to RCA // B. Echo 8/18: EF 60-65, nwm, grade 1 dd, MAC. C. 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   Hyperlipemia    intol to some statins   IBS (irritable bowel syndrome)    Medication intolerance    severe SOB due to Brilinta >> changed to Plavix   Renal disorder    kidney stones   Seasonal allergies    Sinus congestion    chronic   Sleep apnea    uses a c-pap    Medications:  (Not in a hospital admission)  Scheduled:  Infusions:  PRN:   Assessment: 82 yom presenting from urgent care with chest pain. Heparin per pharmacy consult placed for pulmonary embolus.  CTA PE w/ small subsegmental PE on left upper lobe w/ possible RHS  Patient is not on anticoagulation prior to  arrival.  Hgb 15.9; plt 216  Goal of Therapy:  Heparin level 0.3-0.7 units/ml Monitor platelets by anticoagulation protocol: Yes   Plan:  Give IV heparin 5500 units bolus x 1 Start heparin infusion at 1500 units/hr Check anti-Xa level in 8 hours and daily while on heparin Continue to monitor H&H and platelets  Delmar Landau, PharmD, BCPS 11/29/2023 10:44 PM ED Clinical Pharmacist -  [phone]

## 2023-11-29 NOTE — ED Provider Triage Note (Addendum)
Emergency Medicine Provider Triage Evaluation Note  Timothy Wilcox , a 83 y.o. male  was evaluated in triage.  Pt complains of chest pain at 5:30 AM this morning that resolved after 30 minutes.  Patient states he fell like he had a muscle cramp in his chest that eventually went away.  Patient dates he has had progressive weakness over the past few months and had multiple labs and imaging done that has not shown anything.  Patient stopping his amlodipine 10 days ago to see if this would help with his weakness however states he does not feel any different.  Patient has any leg swelling cough or fevers..  Patient did arrive with oxygen normally does not wear oxygen.  Patient states he has history of NSTEMI but states this feels different.  Chest pain is on the left side of his chest.  Review of Systems  Positive:  Negative:   Physical Exam  BP (!) 154/75 (BP Location: Right Arm)   Pulse 74   Temp 98.8 F (37.1 C) (Oral)   Resp 16   SpO2 100%  Gen:   Awake, no distress   Resp:  Increased work of breathing, on 2 L nasal cannula MSK:   Moves extremities without difficulty  Other:    Medical Decision Making  Medically screening exam initiated at 6:46 PM.  Appropriate orders placed.  [name] was informed that the remainder of the evaluation will be completed by another provider, this initial triage assessment does not replace that evaluation, and the importance of remaining in the ED until their evaluation is complete.  Workup initiated  Radiology called back saying the patient has a small PE in the left upper lobe that is subsegmental however there does appear to be signs of heart strain.  Charge nurse was made aware.   Netta Corrigan, PA-C 11/29/23 1847    Netta Corrigan, PA-C 11/29/23 2116

## 2023-11-29 NOTE — ED Triage Notes (Signed)
Patient via EMS from [facility] for eval of squeezing chest pain onset today at 0530. Lasted for 30 mins and resolved. No pain at present but does have nausea. Pt has had fatigue for months but states it is worse today. 324 ASA and 4mg  zofran given by EMS.

## 2023-11-29 NOTE — H&P (Signed)
 History and Physical    Timothy Wilcox ZOX:[id number] DOB: [birth year] DOA: 11/29/2023  PCP: Soundra Pilon, FNP   Patient coming from: Home   Chief Complaint: Chest pain   HPI: Timothy Wilcox is a 83 y.o. male with medical history significant for gout, hypertension, hyperlipidemia, OSA on CPAP, and CAD with stents who presents for evaluation of chest pain.  Patient reports experiencing bouts of fatigue, dyspnea, and light headedness for months but had not experienced chest pain until this morning.  Patient woke early this morning with left-sided chest pain.  The chest pain improved after approximately 30 minutes and he was able to go back to sleep but was lightheaded, fatigued, and short of breath when he woke again.  He continued to have nausea, fatigue, and shortness of breath throughout the day and eventually called EMS.    Patient was in a car for 6 hours 2 months ago but has not had any other prolonged immobilization or recent surgery.  He denies any history of VTE.  He has not noticed any lower extremity pain or swelling.  Aspirin 324 mg and Zofran were given by EMS prior to arrival in the ED.  ED Course: Upon arrival to the ED, patient is found to be afebrile and saturating well on room air with mild tachypnea, normal heart rate, and stable blood pressure.  EKG demonstrates sinus rhythm.  Chest x-ray is negative for acute findings.  CTA chest reveals small segmental and subsegmental PE in the LUL with dilation of RV and multiple pulmonary nodules bilaterally.  Labs are most notable for WBC 11,500, normal hemoglobin, creatinine 1.07, and troponin 26.  Patient was started on IV heparin in the ED.  Review of Systems:  All other systems reviewed and apart from HPI, are negative.  Past Medical History:  Diagnosis Date   Arthritis    CAD (coronary artery disease) 02/26/2018   A. Inf STEMI 6/18: LHC - pLCx 60, dRCA 100, EF 45-50 >> PCI:  DES to RCA // B. Echo 8/18: EF 60-65,  nwm, grade 1 dd, MAC. C. 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   Hyperlipemia    intol to some statins   IBS (irritable bowel syndrome)    Medication intolerance    severe SOB due to Brilinta >> changed to Plavix   Renal disorder    kidney stones   Seasonal allergies    Sinus congestion    chronic   Sleep apnea    uses a c-pap    Past Surgical History:  Procedure Laterality Date   COLONOSCOPY     CORONARY STENT INTERVENTION N/A 04/03/2017   Procedure: Coronary Stent Intervention;  Surgeon: Tonny Bollman, MD;  Location: [facility] INVASIVE CV LAB;  Service: Cardiovascular;  Laterality: N/A;   CORONARY STENT INTERVENTION N/A 02/27/2018   Procedure: CORONARY STENT INTERVENTION;  Surgeon: Lennette Bihari, MD;  Location: [facility] INVASIVE CV LAB;  Service: Cardiovascular;  Laterality: N/A;   INCISION AND DRAINAGE  2011   infected finger   LEFT HEART CATH AND CORONARY ANGIOGRAPHY N/A 04/03/2017   Procedure: Left Heart Cath and Coronary Angiography;  Surgeon: Tonny Bollman, MD;  Location: [facility] INVASIVE CV LAB;  Service: Cardiovascular;  Laterality: N/A;   LEFT HEART CATH AND CORONARY ANGIOGRAPHY N/A 02/27/2018   Procedure: LEFT HEART CATH AND CORONARY ANGIOGRAPHY;  Surgeon: Lennette Bihari, MD;  Location: [facility] INVASIVE CV LAB;  Service:  Cardiovascular;  Laterality: N/A;   SHOULDER ARTHROSCOPY WITH ROTATOR CUFF REPAIR AND SUBACROMIAL DECOMPRESSION Right 01/29/2013   Procedure: RIGHT SHOULDER ARTHROSCOPY WITH SUBACROMIAL DECOMPRESSION, THREE TENDON ROTATOR CUFF REPAIR;  Surgeon: Wyn Forster., MD;  Location: [facility];  Service: Orthopedics;  Laterality: Right;   TOE DEBRIDEMENT     rt toe cyst    Social History:   reports that he has never smoked. He has never used smokeless tobacco. He reports current alcohol use. He reports that he does not use drugs.  Allergies  Allergen Reactions   Brilinta [Ticagrelor] Shortness Of Breath and  Other (See Comments)    Dizziness, weakness/Caused hospital admission   Statins Other (See Comments)    Pain in joints, cant move knees   Clotrimazole-Betamethasone Other (See Comments)    Patient cannot recall symptoms   Dicyclomine Other (See Comments)    Interfered with sleep,nervous   Plavix [Clopidogrel]     SHORT OF BREATH, FATIQUE   Potassium Citrate Diarrhea and Other (See Comments)    Severe stomach pain    Tagamet [Cimetidine] Other (See Comments)    Gynecomastia    Tizanidine Hcl Other (See Comments)    Pt cannot recall symptoms   Zyrtec [Cetirizine Hcl] Other (See Comments)    Pt cannot recall symptoms    Family History  Problem Relation Age of Onset   Skin cancer Mother    Pancreatic cancer Mother    Skin cancer Father      Prior to Admission medications   Medication Sig Start Date End Date Taking? Authorizing Provider  allopurinol (ZYLOPRIM) 300 MG tablet Take 300 mg by mouth daily.   Yes [provider]  amLODipine (NORVASC) 5 MG tablet TAKE 1 TABLET BY MOUTH DAILY 11/14/23  Yes Tonny Bollman, MD  aspirin EC 81 MG tablet Take 81 mg by mouth daily.   Yes [provider]  fluticasone (FLONASE) 50 MCG/ACT nasal spray Place 1 spray into both nostrils 2 (two) times daily. May use 2 sprays during active allergy season.   Yes [provider]  ibuprofen (ADVIL,MOTRIN) 200 MG tablet Take 400 mg by mouth every 6 (six) hours as needed for mild pain (pain score 1-3) (lower back pain).   Yes [provider]  ketoconazole (NIZORAL) 2 % shampoo 5 ml Externally   Yes [provider]  Lactobacillus Rhamnosus, GG, (CULTURELLE) CAPS 1 capsule   Yes [provider]  loratadine (CLARITIN) 10 MG tablet Take 10 mg by mouth daily.   Yes [provider]  Multiple Vitamin (MULTIVITAMIN WITH MINERALS) TABS Take 1 tablet by mouth daily.   Yes [provider]  rosuvastatin (CRESTOR) 5 MG tablet TAKE 1 TABLET BY MOUTH  DAILY Patient taking differently: Take 5 mg by mouth at bedtime. 11/06/23  Yes Tonny Bollman, MD  tacrolimus (PROTOPIC) 0.1 % ointment Apply 1 Application topically daily.   Yes [provider]  Wheat Dextrin (BENEFIBER) POWD Take 1 Dose by mouth daily.   Yes [provider]    Physical Exam: Vitals:   11/30/23 0130 11/30/23 0145 11/30/23 0147 11/30/23 0308  BP: 121/79 116/65  119/69  Pulse: 70 70    Resp: 18 16    Temp:   97.7 F (36.5 C)   TempSrc:   Oral   SpO2: 94% 95%    Weight:      Height:        Constitutional: NAD, no pallor or diaphoresis   Eyes: PERTLA,  lids and conjunctivae normal ENMT: Mucous membranes are moist. Posterior pharynx clear of any exudate or lesions.   Neck: supple, no masses  Respiratory: no wheezing, no crackles. Dyspneic with speech.  Cardiovascular: S1 & S2 heard, regular rate and rhythm. No extremity edema.   Abdomen: no tenderness, soft. Bowel sounds active.  Musculoskeletal: no clubbing / cyanosis. No joint deformity upper and lower extremities.   Skin: no significant rashes, lesions, ulcers. Warm, dry, well-perfused. Neurologic: CN 2-12 grossly intact. Moving all extremities. Alert and oriented.  Psychiatric: Pleasant. Cooperative.    Labs and Imaging on Admission: I have personally reviewed following labs and imaging studies  CBC: Recent Labs  Lab 11/29/23 1838  WBC 11.5*  HGB 15.9  HCT 46.4  MCV 95.7  PLT 216   Basic Metabolic Panel: Recent Labs  Lab 11/29/23 1838  NA 138  K 5.0  CL 104  CO2 18*  GLUCOSE 106*  BUN 14  CREATININE 1.07  CALCIUM 9.7   GFR: Estimated Creatinine Clearance: 63.6 mL/min (by C-G formula based on SCr of 1.07 mg/dL). Liver Function Tests: Recent Labs  Lab 11/30/23 0010  AST 29  ALT 29  ALKPHOS 62  BILITOT 0.8  PROT 7.2  ALBUMIN 3.7   No results for input(s): "LIPASE", "AMYLASE" in the last 168 hours. No results for input(s): "AMMONIA" in the last 168  hours. Coagulation Profile: Recent Labs  Lab 11/30/23 0010  INR 1.1   Cardiac Enzymes: No results for input(s): "CKTOTAL", "CKMB", "CKMBINDEX", "TROPONINI" in the last 168 hours. BNP (last 3 results) No results for input(s): "PROBNP" in the last 8760 hours. HbA1C: No results for input(s): "HGBA1C" in the last 72 hours. CBG: Recent Labs  Lab 11/29/23 1901  GLUCAP 100*   Lipid Profile: No results for input(s): "CHOL", "HDL", "LDLCALC", "TRIG", "CHOLHDL", "LDLDIRECT" in the last 72 hours. Thyroid Function Tests: No results for input(s): "TSH", "T4TOTAL", "FREET4", "T3FREE", "THYROIDAB" in the last 72 hours. Anemia Panel: No results for input(s): "VITAMINB12", "FOLATE", "FERRITIN", "TIBC", "IRON", "RETICCTPCT" in the last 72 hours. Urine analysis:    Component Value Date/Time   COLORURINE YELLOW 11/19/2022 0530   APPEARANCEUR CLEAR 11/19/2022 0530   LABSPEC 1.031 (H) 11/19/2022 0530   PHURINE 6.0 11/19/2022 0530   GLUCOSEU NEGATIVE 11/19/2022 0530   HGBUR LARGE (A) 11/19/2022 0530   BILIRUBINUR NEGATIVE 11/19/2022 0530   KETONESUR TRACE (A) 11/19/2022 0530   PROTEINUR 30 (A) 11/19/2022 0530   NITRITE NEGATIVE 11/19/2022 0530   LEUKOCYTESUR NEGATIVE 11/19/2022 0530   Sepsis Labs: @LABRCNTIP (procalcitonin:4,lacticidven:4) ) Recent Results (from the past 240 hours)  Resp panel by RT-PCR (RSV, Flu A&B, Covid) Anterior Nasal Swab     Status: None   Collection Time: 11/29/23  6:47 PM   Specimen: Anterior Nasal Swab  Result Value Ref Range Status   SARS Coronavirus 2 by RT PCR NEGATIVE NEGATIVE Final   Influenza A by PCR NEGATIVE NEGATIVE Final   Influenza B by PCR NEGATIVE NEGATIVE Final    Comment: (NOTE) The Xpert Xpress SARS-CoV-2/FLU/RSV plus assay is intended as an aid in the diagnosis of influenza from Nasopharyngeal swab specimens and should not be used as a sole basis for treatment. Nasal washings and aspirates are unacceptable for Xpert Xpress  SARS-CoV-2/FLU/RSV testing.  Fact Sheet for Patients: BloggerCourse.com  Fact Sheet for Healthcare Providers: SeriousBroker.it  This test is not yet approved or cleared by the [place] FDA and has been authorized for detection and/or diagnosis of SARS-CoV-2 by FDA under an Emergency  Use Authorization (EUA). This EUA will remain in effect (meaning this test can be used) for the duration of the COVID-19 declaration under Section 564(b)(1) of the Act, 21 U.S.C. section 360bbb-3(b)(1), unless the authorization is terminated or revoked.     Resp Syncytial Virus by PCR NEGATIVE NEGATIVE Final    Comment: (NOTE) Fact Sheet for Patients: BloggerCourse.com  Fact Sheet for Healthcare Providers: SeriousBroker.it  This test is not yet approved or cleared by the [place] FDA and has been authorized for detection and/or diagnosis of SARS-CoV-2 by FDA under an Emergency Use Authorization (EUA). This EUA will remain in effect (meaning this test can be used) for the duration of the COVID-19 declaration under Section 564(b)(1) of the Act, 21 U.S.C. section 360bbb-3(b)(1), unless the authorization is terminated or revoked.  Performed at [facility], [address], [place], [place] [address]      Radiological Exams on Admission: CT Angio Chest PE W/Cm &/Or Wo Cm Result Date: 11/29/2023 CLINICAL DATA:  Chest pain.  Concern for pulmonary embolism. EXAM: CT ANGIOGRAPHY CHEST WITH CONTRAST TECHNIQUE: Multidetector CT imaging of the chest was performed using the standard protocol during bolus administration of intravenous contrast. Multiplanar CT image reconstructions and MIPs were obtained to evaluate the vascular anatomy. RADIATION DOSE REDUCTION: This exam was performed according to the departmental dose-optimization program which includes automated exposure control, adjustment of  the mA and/or kV according to patient size and/or use of iterative reconstruction technique. CONTRAST:  75mL OMNIPAQUE IOHEXOL 350 MG/ML SOLN COMPARISON:  03/18/2018. FINDINGS: Cardiovascular: The heart is enlarged and there is no pericardial effusion. Multi-vessel coronary artery calcifications are noted. There is atherosclerotic calcification of the aorta without evidence of aneurysm. The pulmonary trunk is normal in caliber. Small segmental and subsegmental pulmonary emboli are noted in the left upper lobe, best seen on sagittal image 130. There is dilatation of the right ventricle, which may be associated with underlying right heart strain. Mediastinum/Nodes: No mediastinal, hilar, or axillary lymphadenopathy. Calcified lymph nodes are noted at the mediastinum and right hilum. The thyroid gland, trachea, and esophagus are within normal limits. There is a small hiatal hernia. Lungs/Pleura: No consolidation, effusion or pneumothorax is seen. Scattered pulmonary nodules are present bilaterally measuring up to 1.4 cm in the right lower lobe. Upper Abdomen: No acute abnormality. Musculoskeletal: Degenerative changes are present in the thoracic spine. No acute or suspicious osseous abnormality is seen. Review of the MIP images confirms the above findings. IMPRESSION: 1. Small segmental and subsegmental pulmonary emboli in the left upper lobe. There is dilatation of the right ventricle which may be associated with right heart strain. 2. Multiple pulmonary nodules bilaterally measuring up to 1.4 cm, concerning for neoplastic process, less likely infectious or inflammatory. Non-contrast chest CT at 3-6 months is recommended. If the nodules are stable at time of repeat CT, then future CT at 18-24 months (from today's scan) is considered optional for low-risk patients, but is recommended for high-risk patients. This recommendation follows the consensus statement: Guidelines for Management of Incidental Pulmonary Nodules  Detected on CT Images: From the Fleischner Society 2017; Radiology 2017; 284:228-243. 3. Coronary artery calcifications. 4. Small hiatal hernia. 5. Aortic atherosclerosis. Critical Value/emergent results were called by telephone at the time of interpretation on 11/29/2023 at 9:16 pm to provider [facility] , who verbally acknowledged these results. Electronically Signed   By: Thornell Sartorius M.D.   On: 11/29/2023 21:16   DG Chest 2 View Result Date: 11/29/2023 CLINICAL DATA:  Chest pain EXAM: CHEST - 2 VIEW COMPARISON:  03/19/2018 FINDINGS: Heart normal size no mediastinal contours within normal limits. Aortic atherosclerosis. No confluent opacities or effusions. No acute bony abnormality. IMPRESSION: No active cardiopulmonary disease. Electronically Signed   By: Charlett Nose M.D.   On: 11/29/2023 19:13    EKG: Independently reviewed. Sinus rhythm.   Assessment/Plan   1. Pulmonary embolism  - Small segmental and subsegmental LUL PE; RV is dilated on CT (also noted on TTE from May 2019)  - Continue IV heparin for now, check echocardiogram and LE venous Dopplers   2. Lung nodules  - Noted on CT in ED  - Discussed with patient, close outpatient follow-up advised    3. CAD  - Troponin minimally elevated and flat, not suggestive of ACS  - Continue Crestor    4. OSA  - CPAP while sleeping     DVT prophylaxis: IV heparin  Code Status: Full  Level of Care: Level of care: Telemetry Cardiac Family Communication: None present  Disposition Plan:  Patient is from: home  Anticipated d/c is to: Home  Anticipated d/c date is: 2/22 or 12/01/23  Patient currently: Pending echocardiogram, LE venous Dopplers, tolerance of anticoagulation  Consults called: None  Admission status: Observation     Briscoe Deutscher, MD [facility]  11/30/2023, 4:40 AM

## 2023-11-30 ENCOUNTER — Observation Stay (HOSPITAL_BASED_OUTPATIENT_CLINIC_OR_DEPARTMENT_OTHER): Payer: Medicare HMO

## 2023-11-30 DIAGNOSIS — R079 Chest pain, unspecified: Secondary | ICD-10-CM

## 2023-11-30 DIAGNOSIS — I2609 Other pulmonary embolism with acute cor pulmonale: Secondary | ICD-10-CM | POA: Diagnosis not present

## 2023-11-30 DIAGNOSIS — I2699 Other pulmonary embolism without acute cor pulmonale: Secondary | ICD-10-CM | POA: Diagnosis not present

## 2023-11-30 DIAGNOSIS — R918 Other nonspecific abnormal finding of lung field: Secondary | ICD-10-CM | POA: Diagnosis not present

## 2023-11-30 DIAGNOSIS — E785 Hyperlipidemia, unspecified: Secondary | ICD-10-CM | POA: Diagnosis not present

## 2023-11-30 DIAGNOSIS — I252 Old myocardial infarction: Secondary | ICD-10-CM | POA: Diagnosis not present

## 2023-11-30 DIAGNOSIS — Z86711 Personal history of pulmonary embolism: Secondary | ICD-10-CM | POA: Diagnosis not present

## 2023-11-30 DIAGNOSIS — Z955 Presence of coronary angioplasty implant and graft: Secondary | ICD-10-CM | POA: Diagnosis not present

## 2023-11-30 DIAGNOSIS — J9601 Acute respiratory failure with hypoxia: Secondary | ICD-10-CM

## 2023-11-30 DIAGNOSIS — I2510 Atherosclerotic heart disease of native coronary artery without angina pectoris: Secondary | ICD-10-CM | POA: Diagnosis not present

## 2023-11-30 DIAGNOSIS — R319 Hematuria, unspecified: Secondary | ICD-10-CM | POA: Diagnosis not present

## 2023-11-30 DIAGNOSIS — G4733 Obstructive sleep apnea (adult) (pediatric): Secondary | ICD-10-CM | POA: Diagnosis not present

## 2023-11-30 DIAGNOSIS — Z7982 Long term (current) use of aspirin: Secondary | ICD-10-CM | POA: Diagnosis not present

## 2023-11-30 DIAGNOSIS — Z808 Family history of malignant neoplasm of other organs or systems: Secondary | ICD-10-CM | POA: Diagnosis not present

## 2023-11-30 DIAGNOSIS — I82403 Acute embolism and thrombosis of unspecified deep veins of lower extremity, bilateral: Secondary | ICD-10-CM | POA: Diagnosis not present

## 2023-11-30 DIAGNOSIS — Z79899 Other long term (current) drug therapy: Secondary | ICD-10-CM | POA: Diagnosis not present

## 2023-11-30 DIAGNOSIS — Z888 Allergy status to other drugs, medicaments and biological substances status: Secondary | ICD-10-CM | POA: Diagnosis not present

## 2023-11-30 DIAGNOSIS — Z800 Family history of malignant neoplasm of digestive organs: Secondary | ICD-10-CM | POA: Diagnosis not present

## 2023-11-30 DIAGNOSIS — Z7901 Long term (current) use of anticoagulants: Secondary | ICD-10-CM | POA: Diagnosis not present

## 2023-11-30 DIAGNOSIS — I10 Essential (primary) hypertension: Secondary | ICD-10-CM | POA: Diagnosis not present

## 2023-11-30 DIAGNOSIS — Z87442 Personal history of urinary calculi: Secondary | ICD-10-CM | POA: Diagnosis not present

## 2023-11-30 LAB — CBC
HCT: 42.2 % (ref 39.0–52.0)
Hemoglobin: 14.5 g/dL (ref 13.0–17.0)
MCH: 32.9 pg (ref 26.0–34.0)
MCHC: 34.4 g/dL (ref 30.0–36.0)
MCV: 95.7 fL (ref 80.0–100.0)
Platelets: 217 10*3/uL (ref 150–400)
RBC: 4.41 MIL/uL (ref 4.22–5.81)
RDW: 13.2 % (ref 11.5–15.5)
WBC: 10.2 10*3/uL (ref 4.0–10.5)
nRBC: 0 % (ref 0.0–0.2)

## 2023-11-30 LAB — HEPATIC FUNCTION PANEL
ALT: 29 U/L (ref 0–44)
AST: 29 U/L (ref 15–41)
Albumin: 3.7 g/dL (ref 3.5–5.0)
Alkaline Phosphatase: 62 U/L (ref 38–126)
Bilirubin, Direct: 0.2 mg/dL (ref 0.0–0.2)
Indirect Bilirubin: 0.6 mg/dL (ref 0.3–0.9)
Total Bilirubin: 0.8 mg/dL (ref 0.0–1.2)
Total Protein: 7.2 g/dL (ref 6.5–8.1)

## 2023-11-30 LAB — ECHOCARDIOGRAM COMPLETE
Area-P 1/2: 2.99 cm2
Height: 67 in
S' Lateral: 2.8 cm
Weight: 3702.4 [oz_av]

## 2023-11-30 LAB — TROPONIN I (HIGH SENSITIVITY): Troponin I (High Sensitivity): 31 ng/L — ABNORMAL HIGH (ref <18)

## 2023-11-30 LAB — BASIC METABOLIC PANEL
Anion gap: 16 — ABNORMAL HIGH (ref 5–15)
BUN: 13 mg/dL (ref 8–23)
CO2: 22 mmol/L (ref 22–32)
Calcium: 9.9 mg/dL (ref 8.9–10.3)
Chloride: 103 mmol/L (ref 98–111)
Creatinine, Ser: 1.06 mg/dL (ref 0.61–1.24)
GFR, Estimated: >60 mL/min (ref >60)
Glucose, Bld: 105 mg/dL — ABNORMAL HIGH (ref 70–99)
Potassium: 4.1 mmol/L (ref 3.5–5.1)
Sodium: 141 mmol/L (ref 135–145)

## 2023-11-30 LAB — HEPARIN LEVEL (UNFRACTIONATED)
Heparin Unfractionated: 0.84 [IU]/mL — ABNORMAL HIGH (ref 0.30–0.70)
Heparin Unfractionated: 0.56 [IU]/mL (ref 0.30–0.70)

## 2023-11-30 LAB — PROTIME-INR
INR: 1.1 (ref 0.8–1.2)
Prothrombin Time: 14.5 s (ref 11.4–15.2)

## 2023-11-30 MED ORDER — LORATADINE 10 MG PO TABS
10.0000 mg | ORAL_TABLET | Freq: Every day | ORAL | Status: DC
  Administered 2023-11-30 – 2023-12-01 (×2): 10 mg via ORAL
  Filled 2023-11-30 (×2): qty 1

## 2023-11-30 MED ORDER — FLUTICASONE PROPIONATE 50 MCG/ACT NA SUSP
1.0000 | Freq: Two times a day (BID) | NASAL | Status: DC
  Administered 2023-11-30 – 2023-12-01 (×4): 1 via NASAL
  Filled 2023-11-30: qty 16

## 2023-11-30 NOTE — Progress Notes (Signed)
   11/30/23 0308  BiPAP/CPAP/SIPAP  $ Non-Invasive Ventilator  Non-Invasive Vent Initial  $ Face Mask Small Yes  BiPAP/CPAP/SIPAP Pt Type Adult  BiPAP/CPAP/SIPAP DREAMSTATIOND  Mask Type Nasal mask  Mask Size Small  PEEP 5 cmH20  FiO2 (%) 21 %  Patient Home Equipment No  BiPAP/CPAP /SiPAP Vitals  BP 119/69  Bilateral Breath Sounds Clear  MEWS Score/Color  MEWS Score 0  MEWS Score Color Green

## 2023-11-30 NOTE — Progress Notes (Signed)
 VASCULAR LAB    Bilateral lower extremity venous duplex has been performed.  See CV proc for preliminary results.   Deniss Wormley, RVT 11/30/2023, 10:48 AM

## 2023-11-30 NOTE — Care Management Obs Status (Signed)
 MEDICARE OBSERVATION STATUS NOTIFICATION   Patient Details  Name: Timothy Wilcox MRN: [id number] Date of Birth: December 04, 1940   Medicare Observation Status Notification Given:  Yes   Permission given to sign that he received the notice Lockie Pares, RN 11/30/2023, 11:30 AM

## 2023-11-30 NOTE — Progress Notes (Signed)
 ANTICOAGULATION CONSULT NOTE  Pharmacy Consult for Heparin Indication: pulmonary embolus  Allergies  Allergen Reactions   Brilinta [Ticagrelor] Shortness Of Breath and Other (See Comments)    Dizziness, weakness/Caused hospital admission   Statins Other (See Comments)    Pain in joints, cant move knees   Clotrimazole-Betamethasone Other (See Comments)    Patient cannot recall symptoms   Dicyclomine Other (See Comments)    Interfered with sleep,nervous   Plavix [Clopidogrel]     SHORT OF BREATH, FATIQUE   Potassium Citrate Diarrhea and Other (See Comments)    Severe stomach pain    Tagamet [Cimetidine] Other (See Comments)    Gynecomastia    Tizanidine Hcl Other (See Comments)    Pt cannot recall symptoms   Zyrtec [Cetirizine Hcl] Other (See Comments)    Pt cannot recall symptoms    Patient Measurements: Height: 5\' 7"  (170.2 cm) Weight: 103.4 kg (228 lb) IBW/kg (Calculated) : 66.1 Heparin Dosing Weight: 89.3 kg  Vital Signs: Temp: 98.2 F (36.8 C) (02/22 1347) Temp Source: Oral (02/22 1347) BP: 127/70 (02/22 1347) Pulse Rate: 93 (02/22 1347)  Labs: Recent Labs    11/29/23 1838 11/30/23 0010 11/30/23 0512 11/30/23 0810 11/30/23 1754  HGB 15.9  --  14.5  --   --   HCT 46.4  --  42.2  --   --   PLT 216  --  217  --   --   LABPROT  --  14.5  --   --   --   INR  --  1.1  --   --   --   HEPARINUNFRC  --   --   --  0.84* 0.56  CREATININE 1.07  --  1.06  --   --   TROPONINIHS 26* 31*  --   --   --     Estimated Creatinine Clearance: 63.7 mL/min (by C-G formula based on SCr of 1.06 mg/dL).   Medical History: Past Medical History:  Diagnosis Date   Arthritis    CAD (coronary artery disease) 02/26/2018   A. Inf STEMI 6/18: LHC - pLCx 60, dRCA 100, EF 45-50 >> PCI:  DES to RCA // B. Echo 8/18: EF 60-65, nwm, grade 1 dd, MAC. C. 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   11/29/2023 Morning   amLODipine (NORVASC) 5 MG tablet TAKE 1 TABLET BY MOUTH DAILY 90 tablet 3 11/29/2023 Morning   aspirin EC 81 MG tablet Take 81 mg by mouth daily.   11/29/2023 Morning   fluticasone (FLONASE) 50 MCG/ACT nasal spray Place 1 spray into both nostrils 2 (two) times daily. May use 2 sprays during active allergy season.   11/29/2023 Morning   ibuprofen (ADVIL,MOTRIN) 200 MG tablet Take 400 mg by mouth every 6 (six) hours as needed for mild pain (pain score 1-3) (lower back pain).   Past  Month   ketoconazole (NIZORAL) 2 % shampoo 5 ml Externally   Past Week   Lactobacillus Rhamnosus, GG, (CULTURELLE) CAPS 1 capsule   11/29/2023 Morning   loratadine (CLARITIN) 10 MG tablet Take 10 mg by mouth daily.   11/29/2023 Morning   Multiple Vitamin (MULTIVITAMIN WITH MINERALS) TABS Take 1 tablet by mouth daily.   11/29/2023 Morning   rosuvastatin (CRESTOR) 5 MG tablet TAKE 1 TABLET BY MOUTH DAILY (Patient taking differently: Take 5 mg by mouth at bedtime.) 90 tablet 2 11/28/2023 Bedtime   tacrolimus (PROTOPIC) 0.1 % ointment Apply 1 Application topically daily.   Past Month   Wheat Dextrin (BENEFIBER) POWD Take 1 Dose by mouth daily.   11/29/2023 Morning   Scheduled:   allopurinol  300 mg Oral Daily   amLODipine  5 mg Oral Daily   fluticasone  1 spray Each Nare BID   rosuvastatin  5 mg Oral QHS   sodium chloride flush  3 mL Intravenous Q12H   Infusions:   heparin 1,350 Units/hr (11/30/23 1658)   PRN: acetaminophen **OR** acetaminophen, ondansetron **OR** ondansetron (ZOFRAN) IV, oxyCODONE, polyethylene  glycol  Assessment: 82 yom presenting from urgent care with chest pain. Heparin per pharmacy consult placed for pulmonary embolus.  CTA PE w/ small subsegmental PE on left upper lobe w/ possible RHS  Patient is not on anticoagulation prior to arrival.  PM update: Heparin level 0.56 (therapeutic) after rate decrease to 1350 units/hr.  No issues noted.  Goal of Therapy:  Heparin level 0.3-0.7 units/ml Monitor platelets by anticoagulation protocol: Yes   Plan:  Continue heparin IV 1350 units/hr Daily heparin level and CBC  Trixie Rude, PharmD Clinical Pharmacist 11/30/2023  6:58 PM

## 2023-11-30 NOTE — Progress Notes (Addendum)
  Progress Note   Patient: Timothy Wilcox ZOX:[id number] DOB: 1941-02-07 DOA: 11/29/2023     0 DOS: the patient was seen and examined on 11/30/2023   Brief hospital course:  Timothy Wilcox is a 83 y.o. male with medical history significant for gout, hypertension, hyperlipidemia, OSA on CPAP, and CAD with stents who presents for evaluation of chest pain, subsequently diagnosed with small subsegmental PE.  Assessment and Plan:  Acute hypoxic respiratory failure - Currently has borderline O2 sats on room air, intermittently requiring nasal cannula.  Patient continues to be slightly dyspneic at rest.  Recorded O2 sat 88% while laying flat.  Will continue to attempt to wean O2 as tolerated.  Acute subsegmental pulmonary embolism and BL LE DVT - CT angio noting left upper lobe small subsegmental PE with possible right heart strain.  Lower extremity Dopplers noting bilateral DVTs.  Echo did not comment on any RV dilation or strain.  Likely provoked given decreased ambulation and increased weakness that patient stated has been ongoing for weeks.  Patient continues on IV heparin drip.  Eventual transition to DOAC.  Monitor respiratory function closely.  Subacute/chronic weakness - Etiology unclear.  Obviously exacerbated by above but likely underlying etiology of patient's decreased ambulation and DVTs.  Differential diagnosis is vast.  Will order TSH, CK, CRP total rule out myositis/inflammatory disease.  Other etiologies include advanced age, physical debilitation, mood, hormone deficiencies.     Subjective: Patient resting comfortably this morning however admits to feeling increased weakness and mild shortness of breath.  States his weakness however has been ongoing for many weeks.  Outpatient workup so far has been negative.  Physical Exam: Vitals:   11/30/23 1120 11/30/23 1306 11/30/23 1347 11/30/23 1410  BP: 111/66 124/77 127/70   Pulse: 80 75 93   Resp: 16 20 15    Temp: 98.1 F (36.7  C)  98.2 F (36.8 C)   TempSrc: Oral  Oral   SpO2: 91% 98% 99%   Weight:    103.4 kg  Height:    5\' 7"  (1.702 m)   GENERAL:  Alert, pleasant, no acute distress  HEENT:  EOMI CARDIOVASCULAR:  RRR, no murmurs appreciated RESPIRATORY:  Clear to auscultation, no wheezing, rales, or rhonchi GASTROINTESTINAL:  Soft, nontender, nondistended EXTREMITIES:  No LE edema bilaterally NEURO:  No new focal deficits appreciated SKIN:  No rashes noted PSYCH:  Appropriate mood and affect   Data Reviewed:  There are no new results to review at this time.  Family Communication: None at bedside  Disposition: Status is: Inpatient The patient will require care spanning > 2 midnights and should be moved to inpatient because: hypoxia and PE  Planned Discharge Destination: Home with Home Health    Time spent: 36 minutes  Author: Deanna Artis, DO 11/30/2023 2:34 PM  For on call review [email or web address].

## 2023-11-30 NOTE — Progress Notes (Signed)
 ANTICOAGULATION CONSULT NOTE  Pharmacy Consult for Heparin Indication: pulmonary embolus  Allergies  Allergen Reactions   Brilinta [Ticagrelor] Shortness Of Breath and Other (See Comments)    Dizziness, weakness/Caused hospital admission   Statins Other (See Comments)    Pain in joints, cant move knees   Clotrimazole-Betamethasone Other (See Comments)    Patient cannot recall symptoms   Dicyclomine Other (See Comments)    Interfered with sleep,nervous   Plavix [Clopidogrel]     SHORT OF BREATH, FATIQUE   Potassium Citrate Diarrhea and Other (See Comments)    Severe stomach pain    Tagamet [Cimetidine] Other (See Comments)    Gynecomastia    Tizanidine Hcl Other (See Comments)    Pt cannot recall symptoms   Zyrtec [Cetirizine Hcl] Other (See Comments)    Pt cannot recall symptoms    Patient Measurements: Height: 5\' 7"  (170.2 cm) Weight: 105 kg (231 lb 6.4 oz) IBW/kg (Calculated) : 66.1 Heparin Dosing Weight: 89.3 kg  Vital Signs: Temp: 98.5 F (36.9 C) (02/22 0856) Temp Source: Oral (02/22 0856) BP: 136/76 (02/22 0600) Pulse Rate: 71 (02/22 0600)  Labs: Recent Labs    11/29/23 1838 11/30/23 0010 11/30/23 0512 11/30/23 0810  HGB 15.9  --  14.5  --   HCT 46.4  --  42.2  --   PLT 216  --  217  --   LABPROT  --  14.5  --   --   INR  --  1.1  --   --   HEPARINUNFRC  --   --   --  0.84*  CREATININE 1.07  --  1.06  --   TROPONINIHS 26* 31*  --   --     Estimated Creatinine Clearance: 64.2 mL/min (by C-G formula based on SCr of 1.06 mg/dL).   Medical History: Past Medical History:  Diagnosis Date   Arthritis    CAD (coronary artery disease) 02/26/2018   A. Inf STEMI 6/18: LHC - pLCx 60, dRCA 100, EF 45-50 >> PCI:  DES to RCA // B. Echo 8/18: EF 60-65, nwm, grade 1 dd, MAC. C. 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   Hyperlipemia    intol to some statins   IBS (irritable bowel syndrome)    Medication  intolerance    severe SOB due to Brilinta >> changed to Plavix   Renal disorder    kidney stones   Seasonal allergies    Sinus congestion    chronic   Sleep apnea    uses a c-pap    Medications:  (Not in a hospital admission)  Scheduled:   allopurinol  300 mg Oral Daily   amLODipine  5 mg Oral Daily   fluticasone  1 spray Each Nare BID   rosuvastatin  5 mg Oral QHS   sodium chloride flush  3 mL Intravenous Q12H   Infusions:   heparin 1,500 Units/hr (11/30/23 0031)   PRN: acetaminophen **OR** acetaminophen, ondansetron **OR** ondansetron (ZOFRAN) IV, oxyCODONE, polyethylene glycol  Assessment: 82 yom presenting from urgent care with chest pain. Heparin per pharmacy consult placed for pulmonary embolus.  CTA PE w/ small subsegmental PE on left upper lobe w/ possible RHS  Patient is not on anticoagulation prior to arrival.  Initial heparin level supratherapeutic on 1500 units/hr  Goal of Therapy:  Heparin level 0.3-0.7 units/ml Monitor platelets by anticoagulation protocol: Yes   Plan:  Decrease heparin  gtt to 1350 units/hr F/u 8 hour heparin level  Daylene Posey, PharmD, [facility] Clinical Pharmacist ED Pharmacist Phone # [phone] 11/30/2023 9:58 AM

## 2023-11-30 NOTE — Progress Notes (Signed)
\   Echocardiogram 2D Echocardiogram has been performed.  Timothy Wilcox 11/30/2023, 10:16 AM

## 2023-12-01 DIAGNOSIS — R319 Hematuria, unspecified: Secondary | ICD-10-CM

## 2023-12-01 LAB — URINALYSIS, ROUTINE W REFLEX MICROSCOPIC
Bacteria, UA: NONE SEEN
Bilirubin Urine: NEGATIVE
Glucose, UA: NEGATIVE mg/dL
Ketones, ur: NEGATIVE mg/dL
Leukocytes,Ua: NEGATIVE
Nitrite: NEGATIVE
Protein, ur: 100 mg/dL — AB
RBC / HPF: >50 RBC/hpf (ref 0–5)
Specific Gravity, Urine: 1.017 (ref 1.005–1.030)
WBC, UA: >50 WBC/hpf (ref 0–5)
pH: 6 (ref 5.0–8.0)

## 2023-12-01 LAB — COMPREHENSIVE METABOLIC PANEL
ALT: 22 U/L (ref 0–44)
AST: 23 U/L (ref 15–41)
Albumin: 3 g/dL — ABNORMAL LOW (ref 3.5–5.0)
Alkaline Phosphatase: 52 U/L (ref 38–126)
Anion gap: 10 (ref 5–15)
BUN: 16 mg/dL (ref 8–23)
CO2: 20 mmol/L — ABNORMAL LOW (ref 22–32)
Calcium: 8.8 mg/dL — ABNORMAL LOW (ref 8.9–10.3)
Chloride: 107 mmol/L (ref 98–111)
Creatinine, Ser: 1.19 mg/dL (ref 0.61–1.24)
GFR, Estimated: >60 mL/min (ref >60)
Glucose, Bld: 127 mg/dL — ABNORMAL HIGH (ref 70–99)
Potassium: 4.2 mmol/L (ref 3.5–5.1)
Sodium: 137 mmol/L (ref 135–145)
Total Bilirubin: 0.7 mg/dL (ref 0.0–1.2)
Total Protein: 5.8 g/dL — ABNORMAL LOW (ref 6.5–8.1)

## 2023-12-01 LAB — CBC
HCT: 38.5 % — ABNORMAL LOW (ref 39.0–52.0)
Hemoglobin: 12.7 g/dL — ABNORMAL LOW (ref 13.0–17.0)
MCH: 32.4 pg (ref 26.0–34.0)
MCHC: 33 g/dL (ref 30.0–36.0)
MCV: 98.2 fL (ref 80.0–100.0)
Platelets: 193 10*3/uL (ref 150–400)
RBC: 3.92 MIL/uL — ABNORMAL LOW (ref 4.22–5.81)
RDW: 13.2 % (ref 11.5–15.5)
WBC: 8.8 10*3/uL (ref 4.0–10.5)
nRBC: 0 % (ref 0.0–0.2)

## 2023-12-01 LAB — TROPONIN I (HIGH SENSITIVITY): Troponin I (High Sensitivity): 18 ng/L — ABNORMAL HIGH (ref <18)

## 2023-12-01 LAB — HEPARIN LEVEL (UNFRACTIONATED): Heparin Unfractionated: 0.51 [IU]/mL (ref 0.30–0.70)

## 2023-12-01 LAB — MAGNESIUM: Magnesium: 2 mg/dL (ref 1.7–2.4)

## 2023-12-01 LAB — CK: Total CK: 43 U/L — ABNORMAL LOW (ref 49–397)

## 2023-12-01 LAB — SEDIMENTATION RATE: Sed Rate: 6 mm/h (ref 0–16)

## 2023-12-01 LAB — C-REACTIVE PROTEIN: CRP: 0.8 mg/dL (ref <1.0)

## 2023-12-01 LAB — TSH: TSH: 1.556 u[IU]/mL (ref 0.350–4.500)

## 2023-12-01 MED ORDER — APIXABAN 5 MG PO TABS
10.0000 mg | ORAL_TABLET | Freq: Two times a day (BID) | ORAL | Status: DC
  Administered 2023-12-01: 10 mg via ORAL
  Filled 2023-12-01: qty 2

## 2023-12-01 MED ORDER — ROSUVASTATIN CALCIUM 5 MG PO TABS
5.0000 mg | ORAL_TABLET | Freq: Every day | ORAL | Status: DC
Start: 2023-12-01 — End: 2024-01-30

## 2023-12-01 MED ORDER — SULFAMETHOXAZOLE-TRIMETHOPRIM 800-160 MG PO TABS: 1.0000 | ORAL_TABLET | Freq: Two times a day (BID) | ORAL | 0 refills | Status: DC

## 2023-12-01 MED ORDER — APIXABAN 5 MG PO TABS: 5.0000 mg | ORAL_TABLET | Freq: Two times a day (BID) | ORAL | Status: DC

## 2023-12-01 MED ORDER — APIXABAN 5 MG PO TABS: ORAL_TABLET | ORAL | 1 refills | Status: DC

## 2023-12-01 NOTE — Progress Notes (Addendum)
 ANTICOAGULATION CONSULT NOTE  Pharmacy Consult for Heparin Indication: pulmonary embolus  Allergies  Allergen Reactions   Brilinta [Ticagrelor] Shortness Of Breath and Other (See Comments)    Dizziness, weakness/Caused hospital admission   Statins Other (See Comments)    Pain in joints, cant move knees   Clotrimazole-Betamethasone Other (See Comments)    Patient cannot recall symptoms   Dicyclomine Other (See Comments)    Interfered with sleep,nervous   Plavix [Clopidogrel]     SHORT OF BREATH, FATIQUE   Potassium Citrate Diarrhea and Other (See Comments)    Severe stomach pain    Tagamet [Cimetidine] Other (See Comments)    Gynecomastia    Tizanidine Hcl Other (See Comments)    Pt cannot recall symptoms   Zyrtec [Cetirizine Hcl] Other (See Comments)    Pt cannot recall symptoms    Patient Measurements: Height: 5\' 7"  (170.2 cm) Weight: 103.4 kg (228 lb) IBW/kg (Calculated) : 66.1 Heparin Dosing Weight: 89.3 kg  Vital Signs: Temp: 98.5 F (36.9 C) (02/23 0421) Temp Source: Oral (02/23 0421) BP: 123/71 (02/23 0421) Pulse Rate: 67 (02/23 0421)  Labs: Recent Labs    11/29/23 1838 11/30/23 0010 11/30/23 0512 11/30/23 0810 11/30/23 1754 12/01/23 0416  HGB 15.9  --  14.5  --   --  12.7*  HCT 46.4  --  42.2  --   --  38.5*  PLT 216  --  217  --   --  193  LABPROT  --  14.5  --   --   --   --   INR  --  1.1  --   --   --   --   HEPARINUNFRC  --   --   --  0.84* 0.56 0.51  CREATININE 1.07  --  1.06  --   --  1.19  CKTOTAL  --   --   --   --   --  43*  TROPONINIHS 26* 31*  --   --   --  18*    Estimated Creatinine Clearance: 56.7 mL/min (by C-G formula based on SCr of 1.19 mg/dL).   Medical History: Past Medical History:  Diagnosis Date   Arthritis    CAD (coronary artery disease) 02/26/2018   A. Inf STEMI 6/18: LHC - pLCx 60, dRCA 100, EF 45-50 >> PCI:  DES to RCA // B. Echo 8/18: EF 60-65, nwm, grade 1 dd, MAC. C. 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   11/29/2023 Morning   amLODipine (NORVASC) 5 MG tablet TAKE 1 TABLET BY MOUTH DAILY 90 tablet 3 11/29/2023 Morning   aspirin EC 81 MG tablet Take 81 mg by mouth daily.   11/29/2023 Morning   fluticasone (FLONASE) 50 MCG/ACT nasal spray Place 1 spray into both nostrils 2 (two) times daily. May use 2 sprays during active allergy season.  11/29/2023 Morning   ibuprofen (ADVIL,MOTRIN) 200 MG tablet Take 400 mg by mouth every 6 (six) hours as needed for mild pain (pain score 1-3) (lower back pain).   Past Month   ketoconazole (NIZORAL) 2 % shampoo 5 ml Externally   Past Week   Lactobacillus Rhamnosus, GG, (CULTURELLE) CAPS 1 capsule   11/29/2023 Morning   loratadine (CLARITIN) 10 MG tablet Take 10 mg by mouth daily.   11/29/2023 Morning   Multiple Vitamin (MULTIVITAMIN WITH MINERALS) TABS Take 1 tablet by mouth daily.   11/29/2023 Morning   rosuvastatin (CRESTOR) 5 MG tablet TAKE 1 TABLET BY MOUTH DAILY (Patient taking differently: Take 5 mg by mouth at bedtime.) 90 tablet 2 11/28/2023 Bedtime   tacrolimus (PROTOPIC) 0.1 % ointment Apply 1 Application topically daily.   Past Month   Wheat Dextrin (BENEFIBER) POWD Take 1 Dose by mouth daily.   11/29/2023 Morning   Scheduled:   allopurinol  300 mg Oral Daily   amLODipine  5 mg Oral Daily   fluticasone  1 spray Each Nare BID   loratadine  10 mg Oral Daily   rosuvastatin  5 mg Oral QHS   sodium chloride flush  3 mL Intravenous Q12H   Infusions:   heparin 1,350 Units/hr  (12/01/23 0655)   PRN: acetaminophen **OR** acetaminophen, ondansetron **OR** ondansetron (ZOFRAN) IV, oxyCODONE, polyethylene glycol  Assessment: 82 yom presenting from urgent care with chest pain. Heparin per pharmacy consult placed for pulmonary embolus.  CTA PE w/ small subsegmental PE on left upper lobe w/ possible RHS  Patient is not on anticoagulation prior to arrival.  Heparin level therapeutic at 0.51 on 1350 units/hr. Hgb 12.7, plt 193. No signs of bleeding noted.   Goal of Therapy:  Heparin level 0.3-0.7 units/ml Monitor platelets by anticoagulation protocol: Yes   Plan:  Continue heparin IV 1350 units/hr Daily heparin level and CBC Monitor for s/sx of bleeding F/u plans to switch to oral therapy  Thank you for involving pharmacy in the patient's care.   Theotis Burrow, PharmD PGY1 Acute Care Pharmacy Resident  12/01/2023 7:32 AM  Addendum: nurse reported slight blood tinged urine this AM. Provider notified and still wanted to proceed with switching to eliquis this AM. Eliquis 10 mg BID x 7 days followed by 5 mg BID thereafter entered per MD. Heparin gtt turned off. Continue to monitor bleeding

## 2023-12-01 NOTE — Discharge Summary (Signed)
 Physician Discharge Summary   Patient: Timothy Wilcox MRN: [id number] DOB: 09/01/1941  Admit date:     11/29/2023  Discharge date: 12/01/23  Discharge Physician: Deanna Artis   PCP: Soundra Pilon, FNP   Recommendations at discharge:   At this time patient will be discharged home.  If you experience any symptoms such as fever, vomiting, shortness of breath, chest pain, abdominal pain, or other concerning symptoms, please call your primary care provider or go to the emergency department immediately.  Discharge Diagnoses: Principal Problem:   Acute pulmonary embolism (HCC) Active Problems:   CAD (coronary artery disease)   Acute hypoxic respiratory failure (HCC)   Lung nodules   Sleep apnea  Resolved Problems:   * No resolved hospital problems. *  Hospital Course:   Timothy Wilcox is a 83 y.o. male with medical history significant for gout, hypertension, hyperlipidemia, OSA on CPAP, and CAD with stents who presents for evaluation of chest pain, subsequently diagnosed with small subsegmental PE.  Assessment and Plan:  Acute hypoxic respiratory failure - Initially borderline O2 sats on room air, intermittently requiring nasal cannula.  Patient continues to be slightly dyspneic at rest.  Recorded O2 sat 88% while laying flat.  No improvement throughout the course of hospital stay.  Able to be weaned to room air, breathing comfortably.  Acute subsegmental pulmonary embolism and BL LE DVT - CT angio noting left upper lobe small subsegmental PE with possible right heart strain.  Lower extremity Dopplers noting bilateral DVTs.  Echo did not comment on any RV dilation or strain.  Likely provoked given decreased ambulation and increased weakness that patient stated has been ongoing for weeks.  Patient initiated on IV heparin drip.  Eventual transitioned to DOAC.  Patient to take Eliquis 10 mg twice daily for 7 days, then transition to 5 mg twice daily thereafter.  Patient will be  on anticoagulation for minimum of 3 months.   Subacute/chronic weakness - Etiology unclear.  Obviously exacerbated by above but likely underlying etiology of patient's decreased ambulation and DVTs.  Differential diagnosis is vast.  TSH and other inflammatory markers all negative.  Other etiologies include advanced age, physical debilitation, mood, hormone deficiencies.  Interestingly, since being on anticoagulation, patient has felt markedly improved and energetic.  Especially today.  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  Acute hematuria - Noted after initiating full anticoagulation.  No clots, no pain, no difficulty with urination.  No large change in hemoglobin and no unstable vital signs.  Sidebar with urologist noting that he can follow-up with his own urologist in the outpatient setting should his hematuria stay persistent.  Is likely that it will resolve on its own.  UA noting some WBCs as well.  Will place on empiric antibiotic being taken as prescribed upon discharge.  However if the patient observes any clots, has difficulty or painful urination, or any other concerning symptoms, please contact urology sooner or go to the emergency department.     Pain control - [employer] Controlled Substance Reporting System database was reviewed. and patient was instructed, not to drive, operate heavy machinery, perform activities at heights, swimming or participation in water activities or provide baby-sitting services while on Pain, Sleep and Anxiety Medications; until their outpatient Physician has advised to do so again. Also recommended to not to take more than prescribed Pain, Sleep and Anxiety Medications.  Consultants: Urology Procedures performed: None Disposition: Home Diet recommendation:  Discharge Diet  Orders (From admission, onward)     Start     Ordered   12/01/23 0000  Diet - low sodium heart healthy        12/01/23 1252           Cardiac  diet DISCHARGE MEDICATION: Allergies as of 12/01/2023       Reactions   Brilinta [ticagrelor] Shortness Of Breath, Other (See Comments)   Dizziness, weakness/Caused hospital admission   Statins Other (See Comments)   Pain in joints, cant move knees   Clotrimazole-betamethasone Other (See Comments)   Patient cannot recall symptoms   Dicyclomine Other (See Comments)   Interfered with sleep,nervous   Plavix [clopidogrel]    SHORT OF BREATH, FATIQUE   Potassium Citrate Diarrhea, Other (See Comments)   Severe stomach pain   Tagamet [cimetidine] Other (See Comments)   Gynecomastia    Tizanidine Hcl Other (See Comments)   Pt cannot recall symptoms   Zyrtec [cetirizine Hcl] Other (See Comments)   Pt cannot recall symptoms        Medication List     TAKE these medications    allopurinol 300 MG tablet Commonly known as: ZYLOPRIM Take 300 mg by mouth daily.   amLODipine 5 MG tablet Commonly known as: NORVASC TAKE 1 TABLET BY MOUTH DAILY   apixaban 5 MG Tabs tablet Commonly known as: ELIQUIS Take 2 tablets (10 mg total) by mouth 2 (two) times daily for 7 days, THEN 1 tablet (5 mg total) 2 (two) times daily. Start taking on: December 01, 2023   aspirin EC 81 MG tablet Take 81 mg by mouth daily.   Benefiber Powd Take 1 Dose by mouth daily.   Culturelle Caps 1 capsule   fluticasone 50 MCG/ACT nasal spray Commonly known as: FLONASE Place 1 spray into both nostrils 2 (two) times daily. May use 2 sprays during active allergy season.   ibuprofen 200 MG tablet Commonly known as: ADVIL Take 400 mg by mouth every 6 (six) hours as needed for mild pain (pain score 1-3) (lower back pain).   ketoconazole 2 % shampoo Commonly known as: NIZORAL 5 ml Externally   loratadine 10 MG tablet Commonly known as: CLARITIN Take 10 mg by mouth daily.   multivitamin with minerals Tabs tablet Take 1 tablet by mouth daily.   rosuvastatin 5 MG tablet Commonly known as: CRESTOR Take 1  tablet (5 mg total) by mouth at bedtime.   sulfamethoxazole-trimethoprim 800-160 MG tablet Commonly known as: Bactrim DS Take 1 tablet by mouth 2 (two) times daily.   tacrolimus 0.1 % ointment Commonly known as: PROTOPIC Apply 1 Application topically daily.        Follow-up Information     Soundra Pilon, FNP Follow up in 1 week(s).   Specialty: Family Medicine Contact information: [phone] [address] [place] [address] [phone]                Discharge Exam: Ceasar Mons Weights   11/29/23 2244 11/30/23 1410  Weight: 105 kg 103.4 kg   GENERAL:  Alert, pleasant, no acute distress  HEENT:  EOMI CARDIOVASCULAR:  RRR, no murmurs appreciated RESPIRATORY:  Clear to auscultation, no wheezing, rales, or rhonchi GASTROINTESTINAL:  Soft, nontender, nondistended EXTREMITIES:  No LE edema bilaterally NEURO:  No new focal deficits appreciated SKIN:  No rashes noted PSYCH:  Appropriate mood and affect   Condition at discharge: improving  The results of significant diagnostics from this hospitalization (including imaging, microbiology, ancillary and  laboratory) are listed below for reference.   Imaging Studies: VAS [place] LOWER EXTREMITY VENOUS (DVT) Result Date: 11/30/2023  Lower Venous DVT Study Patient Name:  Timothy Wilcox  Date of Exam:   11/30/2023 Medical Rec #: [id number]           Accession #:    [id number] Date of Birth: [birth year]           Patient Gender: M Patient Age:   47 years Exam Location:  [facility] Procedure:      VAS [place] LOWER EXTREMITY VENOUS (DVT) Referring Phys: Marcial Pacas OPYD --------------------------------------------------------------------------------  Indications: Pulmonary embolism, and Edema.  Comparison Study: No prior study on file Performing Technologist: Sherren Kerns RVS  Examination Guidelines: A complete evaluation includes B-mode imaging, spectral Doppler, color Doppler, and power Doppler as needed of all accessible  portions of each vessel. Bilateral testing is considered an integral part of a complete examination. Limited examinations for reoccurring indications may be performed as noted. The reflux portion of the exam is performed with the patient in reverse Trendelenburg.  +---------+---------------+---------+-----------+----------+--------------+ RIGHT    CompressibilityPhasicitySpontaneityPropertiesThrombus Aging +---------+---------------+---------+-----------+----------+--------------+ CFV      Full           Yes      Yes                                 +---------+---------------+---------+-----------+----------+--------------+ SFJ      Full                                                        +---------+---------------+---------+-----------+----------+--------------+ FV Prox  Full                                                        +---------+---------------+---------+-----------+----------+--------------+ FV Mid   Full                                                        +---------+---------------+---------+-----------+----------+--------------+ FV DistalFull                                                        +---------+---------------+---------+-----------+----------+--------------+ PFV      Full                                                        +---------+---------------+---------+-----------+----------+--------------+ POP      Full           Yes      Yes                                 +---------+---------------+---------+-----------+----------+--------------+  PERO     None                                         Acute          +---------+---------------+---------+-----------+----------+--------------+ Gastroc  Full                                                        +---------+---------------+---------+-----------+----------+--------------+   +---------+---------------+---------+-----------+----------+--------------+ LEFT      CompressibilityPhasicitySpontaneityPropertiesThrombus Aging +---------+---------------+---------+-----------+----------+--------------+ CFV      Full           Yes      Yes                                 +---------+---------------+---------+-----------+----------+--------------+ SFJ      Full                                                        +---------+---------------+---------+-----------+----------+--------------+ FV Prox  Full           Yes      Yes                                 +---------+---------------+---------+-----------+----------+--------------+ FV Mid   Full                                                        +---------+---------------+---------+-----------+----------+--------------+ FV DistalFull                                                        +---------+---------------+---------+-----------+----------+--------------+ PFV      Full           Yes      Yes                                 +---------+---------------+---------+-----------+----------+--------------+ POP      Full           Yes      Yes                                 +---------+---------------+---------+-----------+----------+--------------+ PTV      None                                         Acute          +---------+---------------+---------+-----------+----------+--------------+ PERO     None  Acute          +---------+---------------+---------+-----------+----------+--------------+ TP Trunk None                                         Acute          +---------+---------------+---------+-----------+----------+--------------+     Summary: RIGHT: - Findings consistent with acute deep vein thrombosis involving the right peroneal veins.  - No cystic structure found in the popliteal fossa.  LEFT: - Findings consistent with acute deep vein thrombosis involving the left posterior tibial veins, left peroneal veins,  and TP Trunk.  - A cystic structure is found in the popliteal fossa.  *See table(s) above for measurements and observations. Electronically signed by Sherald Hess MD on 11/30/2023 at 11:58:35 AM.    Final    ECHOCARDIOGRAM COMPLETE Result Date: 11/30/2023    ECHOCARDIOGRAM REPORT   Patient Name:   Timothy Wilcox Date of Exam: 11/30/2023 Medical Rec #:  [id number]          Height:       67.0 in Accession #:    [id number]         Weight:       231.4 lb Date of Birth:  Mar 05, 1941          BSA:          2.152 m Patient Age:    82 years           BP:           136/76 mmHg Patient Gender: M                  HR:           79 bpm. Exam Location:  Inpatient Procedure: 2D Echo, Cardiac Doppler and Color Doppler (Both Spectral and Color            Flow Doppler were utilized during procedure). Indications:    Chest pain, Pulmonary embolus  History:        Patient has prior history of Echocardiogram examinations, most                 recent 03/19/2018. CAD, Acute Pulmonary Embolus,                 Signs/Symptoms:Shortness of Breath; Risk Factors:Dyslipidemia.  Sonographer:    Rosaland Lao Sonographer#2:  Delcie Roch RDCS Referring Phys: [id number] Rael S OPYD IMPRESSIONS  1. Left ventricular ejection fraction, by estimation, is 60 to 65%. The left ventricle has normal function. The left ventricle has no regional wall motion abnormalities. Left ventricular diastolic parameters are consistent with Grade I diastolic dysfunction (impaired relaxation).  2. Right ventricular systolic function is normal. The right ventricular size is normal.  3. The mitral valve is abnormal. No evidence of mitral valve regurgitation. No evidence of mitral stenosis.  4. The aortic valve is tricuspid. There is moderate calcification of the aortic valve. There is moderate thickening of the aortic valve. Aortic valve regurgitation is not visualized. Aortic valve sclerosis is present, with no evidence of aortic valve stenosis.  5. The  inferior vena cava is normal in size with greater than 50% respiratory variability, suggesting right atrial pressure of 3 mmHg. FINDINGS  Left Ventricle: Left ventricular ejection fraction, by estimation, is 60 to 65%. The left ventricle has normal function. The left ventricle has no regional wall motion abnormalities. Strain imaging  was not performed. The left ventricular internal cavity  size was normal in size. There is no left ventricular hypertrophy. Left ventricular diastolic parameters are consistent with Grade I diastolic dysfunction (impaired relaxation). Right Ventricle: The right ventricular size is normal. No increase in right ventricular wall thickness. Right ventricular systolic function is normal. Left Atrium: Left atrial size was normal in size. Right Atrium: Right atrial size was normal in size. Pericardium: There is no evidence of pericardial effusion. Mitral Valve: The mitral valve is abnormal. There is mild thickening of the mitral valve leaflet(s). There is mild calcification of the mitral valve leaflet(s). No evidence of mitral valve regurgitation. No evidence of mitral valve stenosis. Tricuspid Valve: The tricuspid valve is normal in structure. Tricuspid valve regurgitation is trivial. No evidence of tricuspid stenosis. Aortic Valve: The aortic valve is tricuspid. There is moderate calcification of the aortic valve. There is moderate thickening of the aortic valve. Aortic valve regurgitation is not visualized. Aortic valve sclerosis is present, with no evidence of aortic valve stenosis. Pulmonic Valve: The pulmonic valve was normal in structure. Pulmonic valve regurgitation is trivial. No evidence of pulmonic stenosis. Aorta: The aortic root is normal in size and structure. Venous: The inferior vena cava is normal in size with greater than 50% respiratory variability, suggesting right atrial pressure of 3 mmHg. IAS/Shunts: The interatrial septum was not well visualized. Additional Comments: 3D  imaging was not performed.  LEFT VENTRICLE PLAX 2D LVIDd:         4.70 cm   Diastology LVIDs:         2.80 cm   LV e' medial:    5.77 cm/s LV PW:         1.00 cm   LV E/e' medial:  10.5 LV IVS:        0.90 cm   LV e' lateral:   4.19 cm/s LVOT diam:     2.00 cm   LV E/e' lateral: 14.5 LV SV:         52 LV SV Index:   24 LVOT Area:     3.14 cm  RIGHT VENTRICLE RV Basal diam:  3.90 cm RV Mid diam:    4.10 cm RV S prime:     21.60 cm/s TAPSE (M-mode): 2.4 cm LEFT ATRIUM           Index        RIGHT ATRIUM           Index LA diam:      3.30 cm 1.53 cm/m   RA Area:     10.90 cm LA Vol (A2C): 47.8 ml 22.22 ml/m  RA Volume:   21.30 ml  9.90 ml/m LA Vol (A4C): 42.1 ml 19.57 ml/m  AORTIC VALVE LVOT Vmax:   80.90 cm/s LVOT Vmean:  55.800 cm/s LVOT VTI:    0.166 m  AORTA Ao Root diam: 3.40 cm Ao Asc diam:  3.70 cm MITRAL VALVE MV Area (PHT): 2.99 cm     SHUNTS MV Decel Time: 254 msec     Systemic VTI:  0.17 m MV E velocity: 60.80 cm/s   Systemic Diam: 2.00 cm MV A velocity: 102.00 cm/s MV E/A ratio:  0.60 Charlton Haws MD Electronically signed by Charlton Haws MD Signature Date/Time: 11/30/2023/10:21:00 AM    Final    CT Angio Chest PE W/Cm &/Or Wo Cm Result Date: 11/29/2023 CLINICAL DATA:  Chest pain.  Concern for pulmonary embolism. EXAM: CT ANGIOGRAPHY CHEST WITH CONTRAST TECHNIQUE: Multidetector  CT imaging of the chest was performed using the standard protocol during bolus administration of intravenous contrast. Multiplanar CT image reconstructions and MIPs were obtained to evaluate the vascular anatomy. RADIATION DOSE REDUCTION: This exam was performed according to the departmental dose-optimization program which includes automated exposure control, adjustment of the mA and/or kV according to patient size and/or use of iterative reconstruction technique. CONTRAST:  75mL OMNIPAQUE IOHEXOL 350 MG/ML SOLN COMPARISON:  03/18/2018. FINDINGS: Cardiovascular: The heart is enlarged and there is no pericardial effusion.  Multi-vessel coronary artery calcifications are noted. There is atherosclerotic calcification of the aorta without evidence of aneurysm. The pulmonary trunk is normal in caliber. Small segmental and subsegmental pulmonary emboli are noted in the left upper lobe, best seen on sagittal image 130. There is dilatation of the right ventricle, which may be associated with underlying right heart strain. Mediastinum/Nodes: No mediastinal, hilar, or axillary lymphadenopathy. Calcified lymph nodes are noted at the mediastinum and right hilum. The thyroid gland, trachea, and esophagus are within normal limits. There is a small hiatal hernia. Lungs/Pleura: No consolidation, effusion or pneumothorax is seen. Scattered pulmonary nodules are present bilaterally measuring up to 1.4 cm in the right lower lobe. Upper Abdomen: No acute abnormality. Musculoskeletal: Degenerative changes are present in the thoracic spine. No acute or suspicious osseous abnormality is seen. Review of the MIP images confirms the above findings. IMPRESSION: 1. Small segmental and subsegmental pulmonary emboli in the left upper lobe. There is dilatation of the right ventricle which may be associated with right heart strain. 2. Multiple pulmonary nodules bilaterally measuring up to 1.4 cm, concerning for neoplastic process, less likely infectious or inflammatory. Non-contrast chest CT at 3-6 months is recommended. If the nodules are stable at time of repeat CT, then future CT at 18-24 months (from today's scan) is considered optional for low-risk patients, but is recommended for high-risk patients. This recommendation follows the consensus statement: Guidelines for Management of Incidental Pulmonary Nodules Detected on CT Images: From the Fleischner Society 2017; Radiology 2017; 284:228-243. 3. Coronary artery calcifications. 4. Small hiatal hernia. 5. Aortic atherosclerosis. Critical Value/emergent results were called by telephone at the time of  interpretation on 11/29/2023 at 9:16 pm to provider [employer] , who verbally acknowledged these results. Electronically Signed   By: Thornell Sartorius M.D.   On: 11/29/2023 21:16   DG Chest 2 View Result Date: 11/29/2023 CLINICAL DATA:  Chest pain EXAM: CHEST - 2 VIEW COMPARISON:  03/19/2018 FINDINGS: Heart normal size no mediastinal contours within normal limits. Aortic atherosclerosis. No confluent opacities or effusions. No acute bony abnormality. IMPRESSION: No active cardiopulmonary disease. Electronically Signed   By: Charlett Nose M.D.   On: 11/29/2023 19:13    Microbiology: Results for orders placed or performed during the hospital encounter of 11/29/23  Resp panel by RT-PCR (RSV, Flu A&B, Covid) Anterior Nasal Swab     Status: None   Collection Time: 11/29/23  6:47 PM   Specimen: Anterior Nasal Swab  Result Value Ref Range Status   SARS Coronavirus 2 by RT PCR NEGATIVE NEGATIVE Final   Influenza A by PCR NEGATIVE NEGATIVE Final   Influenza B by PCR NEGATIVE NEGATIVE Final    Comment: (NOTE) The Xpert Xpress SARS-CoV-2/FLU/RSV plus assay is intended as an aid in the diagnosis of influenza from Nasopharyngeal swab specimens and should not be used as a sole basis for treatment. Nasal washings and aspirates are unacceptable for Xpert Xpress SARS-CoV-2/FLU/RSV testing.  Fact Sheet for Patients: BloggerCourse.com  Fact  Sheet for Healthcare Providers: SeriousBroker.it  This test is not yet approved or cleared by the [place] and has been authorized for detection and/or diagnosis of SARS-CoV-2 by FDA under an Emergency Use Authorization (EUA). This EUA will remain in effect (meaning this test can be used) for the duration of the COVID-19 declaration under Section 564(b)(1) of the Act, 21 U.S.C. section 360bbb-3(b)(1), unless the authorization is terminated or revoked.     Resp Syncytial Virus by PCR NEGATIVE NEGATIVE Final     Comment: (NOTE) Fact Sheet for Patients: BloggerCourse.com  Fact Sheet for Healthcare Providers: SeriousBroker.it  This test is not yet approved or cleared by the [place] FDA and has been authorized for detection and/or diagnosis of SARS-CoV-2 by FDA under an Emergency Use Authorization (EUA). This EUA will remain in effect (meaning this test can be used) for the duration of the COVID-19 declaration under Section 564(b)(1) of the Act, 21 U.S.C. section 360bbb-3(b)(1), unless the authorization is terminated or revoked.  Performed at [facility], [address], [place], [place] [address]     Labs: CBC: Recent Labs  Lab 11/29/23 1838 11/30/23 0512 12/01/23 0416  WBC 11.5* 10.2 8.8  HGB 15.9 14.5 12.7*  HCT 46.4 42.2 38.5*  MCV 95.7 95.7 98.2  PLT 216 217 193   Basic Metabolic Panel: Recent Labs  Lab 11/29/23 1838 11/30/23 0512 12/01/23 0416  NA 138 141 137  K 5.0 4.1 4.2  CL 104 103 107  CO2 18* 22 20*  GLUCOSE 106* 105* 127*  BUN 14 13 16   CREATININE 1.07 1.06 1.19  CALCIUM 9.7 9.9 8.8*  MG  --   --  2.0   Liver Function Tests: Recent Labs  Lab 11/30/23 0010 12/01/23 0416  AST 29 23  ALT 29 22  ALKPHOS 62 52  BILITOT 0.8 0.7  PROT 7.2 5.8*  ALBUMIN 3.7 3.0*   CBG: Recent Labs  Lab 11/29/23 1901  GLUCAP 100*    Discharge time spent: greater than 30 minutes.  Signed: Deanna Artis, DO [facility] 12/01/2023

## 2023-12-01 NOTE — Progress Notes (Signed)
   12/01/23 0031  BiPAP/CPAP/SIPAP  $ Non-Invasive Home Ventilator  Subsequent  BiPAP/CPAP/SIPAP Resmed  Mask Type Nasal pillows  FiO2 (%) 21 %  Patient Home Equipment Yes  Safety Check Completed by RT for Home Unit Yes, no issues noted

## 2023-12-01 NOTE — Evaluation (Signed)
 Physical Therapy Brief Evaluation and Discharge Note Patient Details Name: Timothy Wilcox MRN: [id number] DOB: [birth year] Today's Date: 12/01/2023   History of Present Illness  Timothy Wilcox is a 83 y.o. male who presents for evaluation of chest pain, subsequently diagnosed with small subsegmental PE and bilat LE DVTs. PMH: for gout, hypertension, hyperlipidemia, OSA on CPAP, and CAD with stents   Clinical Impression  Pt admitted with above. Pt functioning at baseline, indep function without AD. Pt with mild SOB with amb and stair negotiation however SpO2 > 94% on RA. Pt reports "I feel great." Pt with no further acute PT needs at this time. PT SIGNING OFF. Please re-consult if needed in future.       PT Assessment Patient does not need any further PT services  Assistance Needed at Discharge  PRN    Equipment Recommendations None recommended by PT  Recommendations for Other Services       Precautions/Restrictions Precautions Precautions: Other (comment) Precaution/Restrictions Comments: pt on heparin for PE and bilat DVTS Restrictions Weight Bearing Restrictions Per Provider Order: No        Mobility  Bed Mobility       General bed mobility comments: pt received sitting up in chair  Transfers Overall transfer level: Independent Equipment used: None               General transfer comment: no difficulty    Ambulation/Gait Ambulation/Gait assistance: Independent Gait Distance (Feet): 400 Feet Assistive device: None Gait Pattern/deviations: WFL(Within Functional Limits) Gait Speed: Pace WFL General Gait Details: no episode of LOB, mild SOB, SPO2 >94% on RA  Home Activity Instructions    Stairs Stairs: Yes Stairs assistance: Independent Stair Management: No rails, Alternating pattern, Forwards Number of Stairs: 12 General stair comments: no difficulty  Modified Rankin (Stroke Patients Only)        Balance Overall balance assessment: No  apparent balance deficits (not formally assessed)               Standardized Balance Assessment Standardized Balance Assessment : Dynamic Gait Index Dynamic Gait Index Level Surface: Normal Change in Gait Speed: Normal Gait with Horizontal Head Turns: Normal Gait with Vertical Head Turns: Normal Gait and Pivot Turn: Normal Step Over Obstacle: Normal Step Around Obstacles: Normal Steps: Normal Total Score: 24      Pertinent Vitals/Pain   Pain Assessment Pain Assessment: No/denies pain     Home Living Family/patient expects to be discharged to:: Private residence Living Arrangements: Spouse/significant other Available Help at Discharge: Family;Available 24 hours/day Home Environment: Rail - right;Stairs to enter  [employer] of Steps: 4 Home Equipment: Agricultural consultant (2 wheels);Cane - single point        Prior Function Level of Independence: Independent Comments: drives, retired, exercises 3x/week    UE/LE Assessment   UE ROM/Strength/Tone/Coordination: WFL    LE ROM/Strength/Tone/Coordination: [facility]      Communication   Communication Communication: No apparent difficulties     Cognition Overall Cognitive Status: Appears within functional limits for tasks assessed/performed       General Comments General comments (skin integrity, edema, etc.): VSS, SpO2 >94% on RA during activity    Exercises     Assessment/Plan    PT Problem List         PT Visit Diagnosis Difficulty in walking, not elsewhere classified (R26.2)    No Skilled PT All education completed;Patient at baseline level of functioning   Co-evaluation  AMPAC 6 Clicks Help needed turning from your back to your side while in a flat bed without using bedrails?: None Help needed moving from lying on your back to sitting on the side of a flat bed without using bedrails?: None Help needed moving to and from a bed to a chair (including a wheelchair)?: None Help needed  standing up from a chair using your arms (e.g., wheelchair or bedside chair)?: None Help needed to walk in hospital room?: None Help needed climbing 3-5 steps with a railing? : None 6 Click Score: 24      End of Session   Activity Tolerance: Patient tolerated treatment well Patient left: in chair;with call bell/phone within reach;with nursing/sitter in room Nurse Communication: Mobility status PT Visit Diagnosis: Difficulty in walking, not elsewhere classified (R26.2)     Time: 1220-1230 PT Time Calculation (min) (ACUTE ONLY): 10 min  Charges:   PT Evaluation $PT Eval Low Complexity: 1 Low      Lewis Shock, PT, DPT Acute Rehabilitation Services Secure chat preferred Office #: [phone]   Iona Hansen  12/01/2023, 1:24 PM

## 2023-12-04 DIAGNOSIS — E782 Mixed hyperlipidemia: Secondary | ICD-10-CM | POA: Diagnosis not present

## 2023-12-04 DIAGNOSIS — Z09 Encounter for follow-up examination after completed treatment for conditions other than malignant neoplasm: Secondary | ICD-10-CM | POA: Diagnosis not present

## 2023-12-04 DIAGNOSIS — I82403 Acute embolism and thrombosis of unspecified deep veins of lower extremity, bilateral: Secondary | ICD-10-CM | POA: Diagnosis not present

## 2023-12-04 DIAGNOSIS — K449 Diaphragmatic hernia without obstruction or gangrene: Secondary | ICD-10-CM | POA: Diagnosis not present

## 2023-12-04 DIAGNOSIS — I2699 Other pulmonary embolism without acute cor pulmonale: Secondary | ICD-10-CM | POA: Diagnosis not present

## 2023-12-05 DIAGNOSIS — G4733 Obstructive sleep apnea (adult) (pediatric): Secondary | ICD-10-CM | POA: Diagnosis not present

## 2023-12-09 ENCOUNTER — Emergency Department (HOSPITAL_COMMUNITY)

## 2023-12-09 ENCOUNTER — Encounter (HOSPITAL_COMMUNITY): Payer: Self-pay

## 2023-12-09 ENCOUNTER — Other Ambulatory Visit: Payer: Self-pay

## 2023-12-09 ENCOUNTER — Observation Stay (HOSPITAL_COMMUNITY)
Admission: EM | Admit: 2023-12-09 | Discharge: 2023-12-10 | Disposition: A | Discharge status: Home / Self Care | Attending: Family Medicine | Admitting: Family Medicine

## 2023-12-09 DIAGNOSIS — I2699 Other pulmonary embolism without acute cor pulmonale: Secondary | ICD-10-CM | POA: Diagnosis not present

## 2023-12-09 DIAGNOSIS — Z955 Presence of coronary angioplasty implant and graft: Secondary | ICD-10-CM | POA: Diagnosis not present

## 2023-12-09 DIAGNOSIS — R079 Chest pain, unspecified: Principal | ICD-10-CM | POA: Diagnosis present

## 2023-12-09 DIAGNOSIS — R0789 Other chest pain: Secondary | ICD-10-CM | POA: Diagnosis not present

## 2023-12-09 DIAGNOSIS — I10 Essential (primary) hypertension: Secondary | ICD-10-CM | POA: Diagnosis present

## 2023-12-09 DIAGNOSIS — R0600 Dyspnea, unspecified: Secondary | ICD-10-CM | POA: Diagnosis not present

## 2023-12-09 DIAGNOSIS — Z7901 Long term (current) use of anticoagulants: Secondary | ICD-10-CM | POA: Insufficient documentation

## 2023-12-09 DIAGNOSIS — R911 Solitary pulmonary nodule: Secondary | ICD-10-CM | POA: Diagnosis not present

## 2023-12-09 DIAGNOSIS — Z79899 Other long term (current) drug therapy: Secondary | ICD-10-CM | POA: Diagnosis not present

## 2023-12-09 DIAGNOSIS — G4733 Obstructive sleep apnea (adult) (pediatric): Secondary | ICD-10-CM | POA: Diagnosis not present

## 2023-12-09 DIAGNOSIS — Z86711 Personal history of pulmonary embolism: Secondary | ICD-10-CM | POA: Diagnosis present

## 2023-12-09 DIAGNOSIS — Z6835 Body mass index (BMI) 35.0-35.9, adult: Secondary | ICD-10-CM | POA: Insufficient documentation

## 2023-12-09 DIAGNOSIS — K8689 Other specified diseases of pancreas: Secondary | ICD-10-CM | POA: Diagnosis not present

## 2023-12-09 DIAGNOSIS — I2510 Atherosclerotic heart disease of native coronary artery without angina pectoris: Secondary | ICD-10-CM | POA: Diagnosis present

## 2023-12-09 DIAGNOSIS — R918 Other nonspecific abnormal finding of lung field: Secondary | ICD-10-CM | POA: Diagnosis not present

## 2023-12-09 DIAGNOSIS — R0602 Shortness of breath: Secondary | ICD-10-CM | POA: Diagnosis not present

## 2023-12-09 DIAGNOSIS — E785 Hyperlipidemia, unspecified: Secondary | ICD-10-CM | POA: Diagnosis present

## 2023-12-09 DIAGNOSIS — R7989 Other specified abnormal findings of blood chemistry: Secondary | ICD-10-CM

## 2023-12-09 DIAGNOSIS — G4730 Sleep apnea, unspecified: Secondary | ICD-10-CM | POA: Diagnosis present

## 2023-12-09 LAB — COMPREHENSIVE METABOLIC PANEL
ALT: 29 U/L (ref 0–44)
AST: 26 U/L (ref 15–41)
Albumin: 3.7 g/dL (ref 3.5–5.0)
Alkaline Phosphatase: 69 U/L (ref 38–126)
Anion gap: 10 (ref 5–15)
BUN: 16 mg/dL (ref 8–23)
CO2: 19 mmol/L — ABNORMAL LOW (ref 22–32)
Calcium: 9.6 mg/dL (ref 8.9–10.3)
Chloride: 108 mmol/L (ref 98–111)
Creatinine, Ser: 0.99 mg/dL (ref 0.61–1.24)
GFR, Estimated: >60 mL/min (ref >60)
Glucose, Bld: 195 mg/dL — ABNORMAL HIGH (ref 70–99)
Potassium: 4 mmol/L (ref 3.5–5.1)
Sodium: 137 mmol/L (ref 135–145)
Total Bilirubin: 0.6 mg/dL (ref 0.0–1.2)
Total Protein: 7.2 g/dL (ref 6.5–8.1)

## 2023-12-09 LAB — CBC WITH DIFFERENTIAL/PLATELET
Abs Immature Granulocytes: 0.09 10*3/uL — ABNORMAL HIGH (ref 0.00–0.07)
Basophils Absolute: 0.1 10*3/uL (ref 0.0–0.1)
Basophils Relative: 1 %
Eosinophils Absolute: 0.3 10*3/uL (ref 0.0–0.5)
Eosinophils Relative: 4 %
HCT: 44.1 % (ref 39.0–52.0)
Hemoglobin: 14.6 g/dL (ref 13.0–17.0)
Immature Granulocytes: 1 %
Lymphocytes Relative: 13 %
Lymphs Abs: 1 10*3/uL (ref 0.7–4.0)
MCH: 32.3 pg (ref 26.0–34.0)
MCHC: 33.1 g/dL (ref 30.0–36.0)
MCV: 97.6 fL (ref 80.0–100.0)
Monocytes Absolute: 0.7 10*3/uL (ref 0.1–1.0)
Monocytes Relative: 9 %
Neutro Abs: 5.5 10*3/uL (ref 1.7–7.7)
Neutrophils Relative %: 72 %
Platelets: 243 10*3/uL (ref 150–400)
RBC: 4.52 MIL/uL (ref 4.22–5.81)
RDW: 13.2 % (ref 11.5–15.5)
WBC: 7.6 10*3/uL (ref 4.0–10.5)
nRBC: 0 % (ref 0.0–0.2)

## 2023-12-09 LAB — TROPONIN I (HIGH SENSITIVITY)
Troponin I (High Sensitivity): 100 ng/L (ref <18)
Troponin I (High Sensitivity): 110 ng/L (ref <18)

## 2023-12-09 LAB — BRAIN NATRIURETIC PEPTIDE: B Natriuretic Peptide: 30.4 pg/mL (ref 0.0–100.0)

## 2023-12-09 MED ORDER — ACETAMINOPHEN 650 MG RE SUPP: 650.0000 mg | Freq: Four times a day (QID) | RECTAL | Status: DC | PRN

## 2023-12-09 MED ORDER — LORATADINE 10 MG PO TABS
10.0000 mg | ORAL_TABLET | Freq: Every day | ORAL | Status: DC
  Administered 2023-12-09: 10 mg via ORAL
  Filled 2023-12-09: qty 1

## 2023-12-09 MED ORDER — ALLOPURINOL 300 MG PO TABS
300.0000 mg | ORAL_TABLET | Freq: Every day | ORAL | Status: DC
  Administered 2023-12-10: 300 mg via ORAL
  Filled 2023-12-09: qty 1

## 2023-12-09 MED ORDER — IOHEXOL 350 MG/ML SOLN
75.0000 mL | Freq: Once | INTRAVENOUS | Status: AC | PRN
  Administered 2023-12-09: 75 mL via INTRAVENOUS

## 2023-12-09 MED ORDER — NITROGLYCERIN 0.4 MG SL SUBL: 0.4000 mg | SUBLINGUAL_TABLET | SUBLINGUAL | Status: DC | PRN

## 2023-12-09 MED ORDER — ROSUVASTATIN CALCIUM 5 MG PO TABS
5.0000 mg | ORAL_TABLET | Freq: Every day | ORAL | Status: DC
  Administered 2023-12-09: 5 mg via ORAL
  Filled 2023-12-09: qty 1

## 2023-12-09 MED ORDER — BISACODYL 5 MG PO TBEC: 5.0000 mg | DELAYED_RELEASE_TABLET | Freq: Every day | ORAL | Status: DC | PRN

## 2023-12-09 MED ORDER — ASPIRIN 81 MG PO CHEW
324.0000 mg | CHEWABLE_TABLET | Freq: Once | ORAL | Status: AC
  Administered 2023-12-09: 324 mg via ORAL
  Filled 2023-12-09 (×2): qty 4

## 2023-12-09 MED ORDER — SODIUM CHLORIDE 0.9% FLUSH
3.0000 mL | Freq: Two times a day (BID) | INTRAVENOUS | Status: DC
Start: 2023-12-09 — End: 2023-12-10
  Administered 2023-12-09 – 2023-12-10 (×2): 3 mL via INTRAVENOUS

## 2023-12-09 MED ORDER — ACETAMINOPHEN 325 MG PO TABS: 650.0000 mg | ORAL_TABLET | Freq: Four times a day (QID) | ORAL | Status: DC | PRN

## 2023-12-09 MED ORDER — ONDANSETRON HCL 4 MG/2ML IJ SOLN: 4.0000 mg | Freq: Four times a day (QID) | INTRAMUSCULAR | Status: DC | PRN

## 2023-12-09 MED ORDER — ACETAMINOPHEN 325 MG PO TABS
650.0000 mg | ORAL_TABLET | Freq: Once | ORAL | Status: AC
  Administered 2023-12-09: 650 mg via ORAL
  Filled 2023-12-09: qty 2

## 2023-12-09 MED ORDER — SENNOSIDES-DOCUSATE SODIUM 8.6-50 MG PO TABS: 1.0000 | ORAL_TABLET | Freq: Every evening | ORAL | Status: DC | PRN

## 2023-12-09 MED ORDER — AMLODIPINE BESYLATE 5 MG PO TABS
5.0000 mg | ORAL_TABLET | Freq: Every day | ORAL | Status: DC
  Administered 2023-12-10: 5 mg via ORAL
  Filled 2023-12-09: qty 1

## 2023-12-09 MED ORDER — APIXABAN 5 MG PO TABS
5.0000 mg | ORAL_TABLET | Freq: Two times a day (BID) | ORAL | Status: DC
  Administered 2023-12-09: 5 mg via ORAL
  Filled 2023-12-09: qty 1

## 2023-12-09 MED ORDER — FLUTICASONE PROPIONATE 50 MCG/ACT NA SUSP
1.0000 | Freq: Two times a day (BID) | NASAL | Status: DC
  Administered 2023-12-09 – 2023-12-10 (×2): 1 via NASAL
  Filled 2023-12-09: qty 16

## 2023-12-09 MED ORDER — ASPIRIN 81 MG PO TBEC
81.0000 mg | DELAYED_RELEASE_TABLET | Freq: Every day | ORAL | Status: DC
  Administered 2023-12-10: 81 mg via ORAL
  Filled 2023-12-09: qty 1

## 2023-12-09 MED ORDER — ONDANSETRON HCL 4 MG PO TABS: 4.0000 mg | ORAL_TABLET | Freq: Four times a day (QID) | ORAL | Status: DC | PRN

## 2023-12-09 NOTE — Hospital Course (Signed)
 Timothy Wilcox is a 83 y.o. male with medical history significant for CAD s/p DES to RCA and LCx, recent PE/DVT on Eliquis, HTN, HLD, gout, OSA on CPAP who is admitted for chest pain.

## 2023-12-09 NOTE — H&P (Addendum)
 History and Physical    Timothy Wilcox ZOX:[id number] DOB: May 01, 1941 DOA: 12/09/2023  PCP: Soundra Pilon, FNP  Patient coming from: Home  I have personally briefly reviewed patient's old medical records in [facility]  Chief Complaint: Chest pain  HPI: Timothy Wilcox is a 83 y.o. male with medical history significant for CAD s/p DES to RCA and LCx, recent PE/DVT on Eliquis, HTN, HLD, gout, OSA on CPAP who presented to the ED for evaluation of chest pain.  Patient was recently admitted 2/21-2/23 with hypoxia due to small segmental and subsegmental pulmonary emboli in the left upper lobe.  He was found to have bilateral lower extremity DVTs.  He was treated initially with IV heparin and transitioned to Eliquis for minimum of 3 months anticoagulation.  Patient felt well on discharge.  He says today while he was driving he developed left-sided chest pain which felt like a "deep bruise" or muscle tightening sensation.  Pain was nonradiating and lasted about 30 minutes before resolving on its own.  He says this pain was similar to when he came in with a PE last week and feels different than when he had his prior MI.  He denies any associated nausea, vomiting, diaphoresis.  Patient does note feeling fatigued and short of breath with exertion which has been a new ongoing issue for the last 4 months.  He has not experienced any worsening shortness of breath today.  ED Course  Labs/Imaging on admission: I have personally reviewed following labs and imaging studies.  Initial vitals showed BP 149/82, pulse 90, RR 26, temp 98.3 F, SpO2 99% on room air.  Labs showed troponin 100 > 110, BNP 30.4, sodium 137, potassium 4.0, bicarb 19, BUN 16, creatinine 0.99, serum glucose 195, LFTs within normal limits, WBC 7.6, hemoglobin 14.6, platelets 243,000.  CTA chest negative for evidence of acute PE.  Bilateral pulmonary nodules again noted, increased in size from CT on 11/29/2023 and new compared  to CT 09/17/2023.  Patient was given aspirin 324 mg.  EDP spoke with on-call cardiology Dr. Antoine Poche who recommended medical admission for high risk chest pain.  Review of Systems: All systems reviewed and are negative except as documented in history of present illness above.   Past Medical History:  Diagnosis Date   Arthritis    CAD (coronary artery disease) 02/26/2018   A. Inf STEMI 6/18: LHC - pLCx 60, dRCA 100, EF 45-50 >> PCI:  DES to RCA // B. Echo 8/18: EF 60-65, nwm, grade 1 dd, MAC. C. 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   Hyperlipemia    intol to some statins   IBS (irritable bowel syndrome)    Medication intolerance    severe SOB due to Brilinta >> changed to Plavix   Renal disorder    kidney stones   Seasonal allergies    Sinus congestion    chronic   Sleep apnea    uses a c-pap    Past Surgical History:  Procedure Laterality Date   COLONOSCOPY     CORONARY STENT INTERVENTION N/A 04/03/2017   Procedure: Coronary Stent Intervention;  Surgeon: Tonny Bollman, MD;  Location: [facility] INVASIVE CV LAB;  Service: Cardiovascular;  Laterality: N/A;   CORONARY STENT INTERVENTION N/A 02/27/2018   Procedure: CORONARY STENT INTERVENTION;  Surgeon: Lennette Bihari, MD;  Location: [facility] INVASIVE CV LAB;  Service: Cardiovascular;  Laterality: N/A;  INCISION AND DRAINAGE  2011   infected finger   LEFT HEART CATH AND CORONARY ANGIOGRAPHY N/A 04/03/2017   Procedure: Left Heart Cath and Coronary Angiography;  Surgeon: Tonny Bollman, MD;  Location: [facility] INVASIVE CV LAB;  Service: Cardiovascular;  Laterality: N/A;   LEFT HEART CATH AND CORONARY ANGIOGRAPHY N/A 02/27/2018   Procedure: LEFT HEART CATH AND CORONARY ANGIOGRAPHY;  Surgeon: Lennette Bihari, MD;  Location: [facility] INVASIVE CV LAB;  Service: Cardiovascular;  Laterality: N/A;   SHOULDER ARTHROSCOPY WITH ROTATOR CUFF REPAIR AND SUBACROMIAL DECOMPRESSION Right 01/29/2013   Procedure: RIGHT  SHOULDER ARTHROSCOPY WITH SUBACROMIAL DECOMPRESSION, THREE TENDON ROTATOR CUFF REPAIR;  Surgeon: Wyn Forster., MD;  Location: [facility];  Service: Orthopedics;  Laterality: Right;   TOE DEBRIDEMENT     rt toe cyst    Social History:  reports that he has never smoked. He has never used smokeless tobacco. He reports current alcohol use. He reports that he does not use drugs.  Allergies  Allergen Reactions   Brilinta [Ticagrelor] Shortness Of Breath and Other (See Comments)    Dizziness, weakness/Caused hospital admission   Statins Other (See Comments)    Pain in joints, cant move knees   Clotrimazole-Betamethasone Other (See Comments)    Patient cannot recall symptoms   Dicyclomine Other (See Comments)    Interfered with sleep,nervous   Plavix [Clopidogrel]     SHORT OF BREATH, FATIQUE   Potassium Citrate Diarrhea and Other (See Comments)    Severe stomach pain    Tagamet [Cimetidine] Other (See Comments)    Gynecomastia    Tizanidine Hcl Other (See Comments)    Pt cannot recall symptoms   Zyrtec [Cetirizine Hcl] Other (See Comments)    Pt cannot recall symptoms    Family History  Problem Relation Age of Onset   Skin cancer Mother    Pancreatic cancer Mother    Skin cancer Father      Prior to Admission medications   Medication Sig Start Date End Date Taking? Authorizing Provider  allopurinol (ZYLOPRIM) 300 MG tablet Take 300 mg by mouth daily.    [provider]  amLODipine (NORVASC) 5 MG tablet TAKE 1 TABLET BY MOUTH DAILY 11/14/23   Tonny Bollman, MD  apixaban (ELIQUIS) 5 MG TABS tablet Take 2 tablets (10 mg total) by mouth 2 (two) times daily for 7 days, THEN 1 tablet (5 mg total) 2 (two) times daily. 12/01/23 03/07/24  Deanna Artis, DO  aspirin EC 81 MG tablet Take 81 mg by mouth daily.    [provider]  fluticasone (FLONASE) 50 MCG/ACT nasal spray Place 1 spray into both nostrils 2 (two) times daily. May use 2 sprays during  active allergy season.    [provider]  ibuprofen (ADVIL,MOTRIN) 200 MG tablet Take 400 mg by mouth every 6 (six) hours as needed for mild pain (pain score 1-3) (lower back pain).    [provider]  ketoconazole (NIZORAL) 2 % shampoo 5 ml Externally    [provider]  Lactobacillus Rhamnosus, GG, (CULTURELLE) CAPS 1 capsule    [provider]  loratadine (CLARITIN) 10 MG tablet Take 10 mg by mouth daily.    [provider]  Multiple Vitamin (MULTIVITAMIN WITH MINERALS) TABS Take 1 tablet by mouth daily.    [provider]  rosuvastatin (CRESTOR) 5 MG tablet Take 1 tablet (5 mg total) by mouth at bedtime. 12/01/23   Deanna Artis, DO  sulfamethoxazole-trimethoprim (BACTRIM  DS) 800-160 MG tablet Take 1 tablet by mouth 2 (two) times daily. 12/01/23   Deanna Artis, DO  tacrolimus (PROTOPIC) 0.1 % ointment Apply 1 Application topically daily.    [provider]  Wheat Dextrin (BENEFIBER) POWD Take 1 Dose by mouth daily.    [provider]    Physical Exam: Vitals:   12/09/23 1600 12/09/23 1700 12/09/23 1715 12/09/23 1722  BP: (!) 142/78 (!) 141/78 (!) 145/82   Pulse: 86 97 93   Resp: 14 (!) 25 19   Temp:    98.2 F (36.8 C)  TempSrc:    Oral  SpO2: 97% 96% 96%    Constitutional: Sitting up on the side of the bed.  NAD, calm, comfortable Eyes: EOMI, lids and conjunctivae normal ENMT: Mucous membranes are moist. Posterior pharynx clear of any exudate or lesions.Normal dentition.  Neck: normal, supple, no masses. Respiratory: clear to auscultation bilaterally, no wheezing, no crackles. Normal respiratory effort. No accessory muscle use.  Cardiovascular: Regular rate and rhythm, no murmurs / rubs / gallops. No extremity edema. 2+ pedal pulses. Abdomen: no tenderness, no masses palpated.  Musculoskeletal: no clubbing / cyanosis. No joint deformity upper and lower extremities. Good ROM, no contractures. Normal muscle  tone.  Skin: no rashes, lesions, ulcers. No induration Neurologic: Sensation intact. Strength 5/5 in all 4.  Psychiatric: Normal judgment and insight. Alert and oriented x 3. Normal mood.   EKG: Personally reviewed. Sinus rhythm, rate 100, no acute ischemic changes.  Rate is faster when compared to prior.  Assessment/Plan Principal Problem:   Chest pain Active Problems:   CAD (coronary artery disease)   History of pulmonary embolus (PE)   Hyperlipidemia   Sleep apnea   Essential hypertension   Timothy Wilcox is a 83 y.o. male with medical history significant for CAD s/p DES to RCA and LCx, recent PE/DVT on Eliquis, HTN, HLD, gout, OSA on CPAP who is admitted for chest pain.  Assessment and Plan: Chest pain CAD s/p DES to RCA and LCx: Patient with nonradiating left-sided chest pain.  Troponin 100 > 110.  EKG without significant ST/T wave changes.  CTA chest negative for PE.  EDP discussed case with cardiology who recommended overnight observation for high risk chest pain. -Keep on telemetry -Aspirin 324 given in the ED, resume 81 mg daily tomorrow -Continue rosuvastatin -TTE 11/30/2023 showed EF 60-65%, no RWMA -SL NTG as needed  Recent PE and bilateral lower extremity DVTs: Repeat CTA chest without evidence of residual or acute PE.  Continue Eliquis 5 mg twice daily.  Hypertension: Continue amlodipine 5 mg daily.  OSA: Continue CPAP nightly.  Multiple bilateral pulmonary nodules: Again noted on CT imaging.  Findings discussed with patient.  Recommend close outpatient follow-up.   DVT prophylaxis:  apixaban (ELIQUIS) tablet 5 mg   Code Status: Full code, confirmed with patient on admission Family Communication: Discussed with patient, he has discussed with family Disposition Plan: From home and likely discharge to home pending clinical progress Consults called: Cardiology Severity of Illness: The appropriate patient status for this patient is OBSERVATION. Observation  status is judged to be reasonable and necessary in order to provide the required intensity of service to ensure the patient's safety. The patient's presenting symptoms, physical exam findings, and initial radiographic and laboratory data in the context of their medical condition is felt to place them at decreased risk for further clinical deterioration. Furthermore, it is anticipated that the patient will be medically stable for discharge  from the hospital within 2 midnights of admission.   Darreld Mclean MD [facility]  If 7PM-7AM, please contact night-coverage [email or web address]  12/09/2023, 8:18 PM

## 2023-12-09 NOTE — ED Provider Notes (Signed)
  EMERGENCY DEPARTMENT AT [facility] Provider Note   CSN: [id number] Arrival date & time: 12/09/23  1130     History {Add pertinent medical, surgical, social history, OB history to HPI:1} Chief Complaint  Patient presents with   Chest Pain    DEMONDRE Wilcox is a 83 y.o. male.  Patient recently had a PE and is on Eliquis.  He started having chest pain and shortness of breath today.  Patient has a history of coronary artery disease also  The history is provided by the patient and medical records. No language interpreter was used.  Chest Pain Pain location:  L chest Pain quality: aching   Pain radiates to:  Does not radiate Pain severity:  Moderate Onset quality:  Sudden Timing:  Constant Progression:  Waxing and waning Chronicity:  New Context: not breathing   Relieved by:  Nothing Associated symptoms: no abdominal pain, no back pain, no cough, no fatigue and no headache        Home Medications Prior to Admission medications   Medication Sig Start Date End Date Taking? Authorizing Provider  allopurinol (ZYLOPRIM) 300 MG tablet Take 300 mg by mouth daily.    [provider]  amLODipine (NORVASC) 5 MG tablet TAKE 1 TABLET BY MOUTH DAILY 11/14/23   Tonny Bollman, MD  apixaban (ELIQUIS) 5 MG TABS tablet Take 2 tablets (10 mg total) by mouth 2 (two) times daily for 7 days, THEN 1 tablet (5 mg total) 2 (two) times daily. 12/01/23 03/07/24  Deanna Artis, DO  aspirin EC 81 MG tablet Take 81 mg by mouth daily.    [provider]  fluticasone (FLONASE) 50 MCG/ACT nasal spray Place 1 spray into both nostrils 2 (two) times daily. May use 2 sprays during active allergy season.    [provider]  ibuprofen (ADVIL,MOTRIN) 200 MG tablet Take 400 mg by mouth every 6 (six) hours as needed for mild pain (pain score 1-3) (lower back pain).    [provider]  ketoconazole (NIZORAL) 2 % shampoo 5 ml Externally    [provider]  Lactobacillus Rhamnosus, GG, (CULTURELLE) CAPS 1 capsule    [provider]  loratadine (CLARITIN) 10 MG tablet Take 10 mg by mouth daily.    [provider]  Multiple Vitamin (MULTIVITAMIN WITH MINERALS) TABS Take 1 tablet by mouth daily.    [provider]  rosuvastatin (CRESTOR) 5 MG tablet Take 1 tablet (5 mg total) by mouth at bedtime. 12/01/23   Deanna Artis, DO  sulfamethoxazole-trimethoprim (BACTRIM DS) 800-160 MG tablet Take 1 tablet by mouth 2 (two) times daily. 12/01/23   Deanna Artis, DO  tacrolimus (PROTOPIC) 0.1 % ointment Apply 1 Application topically daily.    [provider]  Wheat Dextrin (BENEFIBER) POWD Take 1 Dose by mouth daily.    [provider]      Allergies    Brilinta [ticagrelor], Statins, Clotrimazole-betamethasone, Dicyclomine, Plavix [clopidogrel], Potassium citrate, Tagamet [cimetidine], Tizanidine hcl, and Zyrtec [cetirizine hcl]    Review of Systems   Review of Systems  Constitutional:  Negative for appetite change and fatigue.  HENT:  Negative for congestion, ear discharge and sinus pressure.   Eyes:  Negative for discharge.  Respiratory:  Negative for cough.   Cardiovascular:  Positive for chest pain.  Gastrointestinal:  Negative for abdominal pain and diarrhea.  Genitourinary:  Negative for frequency and hematuria.  Musculoskeletal:  Negative for back pain.  Skin:  Negative  for rash.  Neurological:  Negative for seizures and headaches.  Psychiatric/Behavioral:  Negative for hallucinations.     Physical Exam Updated Vital Signs BP (!) 142/78   Pulse 86   Temp 97.7 F (36.5 C) (Oral)   Resp 14   SpO2 97%  Physical Exam Vitals and nursing note reviewed.  Constitutional:      Appearance: He is well-developed.  HENT:     Head: Normocephalic.     Nose: Nose normal.  Eyes:     General: No scleral icterus.    Conjunctiva/sclera: Conjunctivae normal.  Neck:     Thyroid: No  thyromegaly.  Cardiovascular:     Rate and Rhythm: Normal rate and regular rhythm.     Heart sounds: No murmur heard.    No friction rub. No gallop.  Pulmonary:     Breath sounds: No stridor. No wheezing or rales.  Chest:     Chest wall: No tenderness.  Abdominal:     General: There is no distension.     Tenderness: There is no abdominal tenderness. There is no rebound.  Musculoskeletal:        General: Normal range of motion.     Cervical back: Neck supple.  Lymphadenopathy:     Cervical: No cervical adenopathy.  Skin:    Findings: No erythema or rash.  Neurological:     Mental Status: He is alert and oriented to person, place, and time.     Motor: No abnormal muscle tone.     Coordination: Coordination normal.  Psychiatric:        Behavior: Behavior normal.     ED Results / Procedures / Treatments   Labs (all labs ordered are listed, but only abnormal results are displayed) Labs Reviewed  CBC WITH DIFFERENTIAL/PLATELET - Abnormal; Notable for the following components:      Result Value   Abs Immature Granulocytes 0.09 (*)    All other components within normal limits  COMPREHENSIVE METABOLIC PANEL - Abnormal; Notable for the following components:   CO2 19 (*)    Glucose, Bld 195 (*)    All other components within normal limits  TROPONIN I (HIGH SENSITIVITY) - Abnormal; Notable for the following components:   Troponin I (High Sensitivity) 100 (*)    All other components within normal limits  TROPONIN I (HIGH SENSITIVITY) - Abnormal; Notable for the following components:   Troponin I (High Sensitivity) 110 (*)    All other components within normal limits  BRAIN NATRIURETIC PEPTIDE    EKG EKG Interpretation Date/Time:  Monday December 09 2023 11:40:36 EST Ventricular Rate:  100 PR Interval:  176 QRS Duration:  94 QT Interval:  348 QTC Calculation: 449 R Axis:   3  Text Interpretation: Sinus tachycardia Abnormal R-wave progression, early transition Borderline  repolarization abnormality Confirmed by Bethann Berkshire [phone]) on 12/09/2023 3:13:58 PM  Radiology DG Chest Port 1 View Result Date: 12/09/2023 CLINICAL DATA:  Known left upper lobe pulmonary embolism with dyspnea EXAM: PORTABLE CHEST 1 VIEW COMPARISON:  Chest radiograph dated 11/29/2023 FINDINGS: Normal lung volumes. No focal consolidations. Known multifocal bilateral pulmonary nodules are better evaluated on CT. No pleural effusion or pneumothorax. The heart size and mediastinal contours are within normal limits. No acute osseous abnormality. IMPRESSION: No acute disease. Electronically Signed   By: Agustin Cree M.D.   On: 12/09/2023 15:22    Procedures Procedures  {Document cardiac monitor, telemetry assessment procedure when appropriate:1}  Medications Ordered in ED Medications  iohexol (OMNIPAQUE)  350 MG/ML injection 75 mL (75 mLs Intravenous Contrast Given 12/09/23 1453)  acetaminophen (TYLENOL) tablet 650 mg (650 mg Oral Given 12/09/23 1614)    ED Course/ Medical Decision Making/ A&P   {Patient with chest pain and recent PE. Click here for ABCD2, HEART and other calculatorsREFRESH Note before signing :1}                              Medical Decision Making Risk OTC drugs. Prescription drug management.     {Document critical care time when appropriate:1} {Document review of labs and clinical decision tools ie heart score, Chads2Vasc2 etc:1}  {Document your independent review of radiology images, and any outside records:1} {Document your discussion with family members, caretakers, and with consultants:1} {Document social determinants of health affecting pt's care:1} {Document your decision making why or why not admission, treatments were needed:1} Final Clinical Impression(s) / ED Diagnoses Final diagnoses:  None    Rx / DC Orders ED Discharge Orders     None

## 2023-12-09 NOTE — ED Provider Notes (Signed)
 Patient signed out to me at 1630 by Dr. Estell Harpin pending CT PE study.  In short this is an 83 year old male with past medical history of CAD and recent admission for PE presenting to the emergency department with chest pain.  Patient reports to me that he was driving in his car when he had sudden onset of left-sided chest pain that felt like a bruise.  He states it lasted about 30 minutes and resolved on its own.  Denied any associated nausea, vomiting or diaphoresis.  EKG on arrival showed normal sinus rhythm without acute ischemic changes.  Initial troponin was elevated at 100, repeat 110.  He is currently chest pain-free.  Clinical Course as of 12/09/23 1911  Mon Dec 09, 2023  1729 CTPE without evidence of PE. Does have pulmonary nodules concerning for possible metastasis vs infection. With significant cardiac risk factors will discuss with cardiology for disposition. [VK]  [id number] I spoke with Dr. Antoine Poche from cardiology who recommends admission for obs with high risk chest pain. Ok to stay at [facility] under hospitalist service. [VK]    Clinical Course User Index [VK] Rexford Maus, DO      Rexford Maus, [place] 12/09/23 1911

## 2023-12-09 NOTE — ED Provider Triage Note (Signed)
 Emergency Medicine Provider Triage Evaluation Note  Timothy Wilcox , a 83 y.o. male  was evaluated in triage.  Pt complains of chest pain, shortness of breath that started about 1 hour ago.  Was admitted for PE about 1 week ago.  He states he was doing well up until this morning.  Just reduced his Eliquis dose to 1 pill twice a day.  Also has history of CAD status post stents.  States this does not feel like his previous MIs and feels like his blood clot..  Review of Systems  Positive: As above Negative: As above  Physical Exam  BP (!) 149/82 (BP Location: Left Arm)   Pulse 96   Temp 98.3 F (36.8 C) (Oral)   Resp (!) 26   SpO2 99%  Gen:   Awake, no distress   Resp:  Normal effort  MSK:   Moves extremities without difficulty  Other:    Medical Decision Making  Medically screening exam initiated at 12:09 PM.  Appropriate orders placed.  Timothy Wilcox was informed that the remainder of the evaluation will be completed by another provider, this initial triage assessment does not replace that evaluation, and the importance of remaining in the ED until their evaluation is complete.     Marita Kansas, PA-C 12/09/23 1210

## 2023-12-09 NOTE — ED Triage Notes (Signed)
 Pt presents today with c/o chest pain. Pt was recently admitted for a PE, left the hospital approx one week ago. Pt is short of breath in triage but able to answer questions.

## 2023-12-10 ENCOUNTER — Telehealth: Payer: Self-pay | Admitting: Pulmonary Disease

## 2023-12-10 DIAGNOSIS — R7989 Other specified abnormal findings of blood chemistry: Secondary | ICD-10-CM

## 2023-12-10 DIAGNOSIS — I2699 Other pulmonary embolism without acute cor pulmonale: Secondary | ICD-10-CM | POA: Diagnosis not present

## 2023-12-10 DIAGNOSIS — R918 Other nonspecific abnormal finding of lung field: Secondary | ICD-10-CM | POA: Diagnosis not present

## 2023-12-10 DIAGNOSIS — R079 Chest pain, unspecified: Secondary | ICD-10-CM | POA: Diagnosis not present

## 2023-12-10 DIAGNOSIS — I25118 Atherosclerotic heart disease of native coronary artery with other forms of angina pectoris: Secondary | ICD-10-CM | POA: Diagnosis not present

## 2023-12-10 LAB — BASIC METABOLIC PANEL
Anion gap: 10 (ref 5–15)
BUN: 15 mg/dL (ref 8–23)
CO2: 20 mmol/L — ABNORMAL LOW (ref 22–32)
Calcium: 9 mg/dL (ref 8.9–10.3)
Chloride: 103 mmol/L (ref 98–111)
Creatinine, Ser: 0.9 mg/dL (ref 0.61–1.24)
GFR, Estimated: >60 mL/min (ref >60)
Glucose, Bld: 117 mg/dL — ABNORMAL HIGH (ref 70–99)
Potassium: 3.9 mmol/L (ref 3.5–5.1)
Sodium: 133 mmol/L — ABNORMAL LOW (ref 135–145)

## 2023-12-10 LAB — CBC
HCT: 39.4 % (ref 39.0–52.0)
Hemoglobin: 13 g/dL (ref 13.0–17.0)
MCH: 32.6 pg (ref 26.0–34.0)
MCHC: 33 g/dL (ref 30.0–36.0)
MCV: 98.7 fL (ref 80.0–100.0)
Platelets: 214 10*3/uL (ref 150–400)
RBC: 3.99 MIL/uL — ABNORMAL LOW (ref 4.22–5.81)
RDW: 13.2 % (ref 11.5–15.5)
WBC: 8.5 10*3/uL (ref 4.0–10.5)
nRBC: 0 % (ref 0.0–0.2)

## 2023-12-10 LAB — TROPONIN I (HIGH SENSITIVITY): Troponin I (High Sensitivity): 132 ng/L (ref <18)

## 2023-12-10 MED ORDER — APIXABAN 5 MG PO TABS
5.0000 mg | ORAL_TABLET | Freq: Two times a day (BID) | ORAL | Status: DC
  Administered 2023-12-10: 5 mg via ORAL
  Filled 2023-12-10: qty 1

## 2023-12-10 MED ORDER — HEPARIN (PORCINE) 25000 UT/250ML-% IV SOLN
1100.0000 [IU]/h | INTRAVENOUS | Status: DC
  Administered 2023-12-10: 1100 [IU]/h via INTRAVENOUS
  Filled 2023-12-10: qty 250

## 2023-12-10 NOTE — Telephone Encounter (Signed)
 LM for PT to call for a appt. Adv to have scheduler look at chart for special inst.

## 2023-12-10 NOTE — Discharge Summary (Signed)
 Physician Discharge Summary   Patient: Timothy Wilcox MRN: [id number] DOB: 1941-09-18  Admit date:     12/09/2023  Discharge date: 12/10/23  Discharge Physician: Alberteen Sam   PCP: Soundra Pilon, FNP     Recommendations at discharge:  Follow up with Pulmonology Dr. Francine Graven in 2-3 weeks for lung nodules Follow up with PCP Dr. 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 M with CAD s/p DES last in 2019, recent PE/DVT on Eliquis, HTN, HLD, gout, OSA on CPAP who is admitted for chest pain.    Chest pain Admitted and CTA showed no new findings.  EKG normal.  Troponin minimally elevated and flat.  Cardiology were consulted, and they noted his recent ischemic work up was reassuring, and they recommended against ischemic work up.    We suspect this was either a very small transient PE or pain related to his lung nodules.    Lung nodules These were noted incidentally on CT chest last month.  No follow up arranged.  They are slightly larger on imaging today.  Discussed with patient regarding staying for hospital work up or expedited outpatient work up.  He prefers the latter.  Discussed with Pulmonology, they will arrange.   Pancreas mass I can first find record of this in CT Feb 2024.  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  Repeat CT last Dec again showed cystic mass, MRI or serial CT recommended.   Given lung nodules, I would recommend MRI abdomen with and without contrast and probably GI referral for EUS.          The [facility] Controlled Substances Registry was reviewed for this patient prior to discharge.   Consultants: Cardiology, Dr. Avelino Leeds  Procedures performed:  CTA  chest   Disposition: Home Diet recommendation:  Cardiac diet  DISCHARGE MEDICATION: Allergies as of 12/10/2023       Reactions   Brilinta [ticagrelor] Shortness Of Breath, Other (See Comments)   Dizziness, weakness/Caused hospital admission   Plavix [clopidogrel] Shortness Of Breath, Other (See Comments)   Fatigue, also   Statins Other (See Comments)   Pain in joints, can't move knees   Clotrimazole-betamethasone Other (See Comments)   Patient cannot recall symptoms   Potassium Citrate Diarrhea, Other (See Comments)   Severe stomach pain   Tagamet [cimetidine] Other (See Comments)   Gynecomastia    Tizanidine Hcl Other (See Comments)   Pt cannot recall symptoms   Zyrtec [cetirizine Hcl] Other (See Comments)   Pt cannot recall symptoms   Dicyclomine Anxiety, Other (See Comments)   Interfered with sleep- made nervous        Medication List     TAKE these medications    allopurinol 300 MG tablet Commonly known as: ZYLOPRIM Take 300 mg by mouth daily.   amLODipine 5 MG tablet Commonly known as: NORVASC TAKE 1 TABLET BY MOUTH DAILY   apixaban 5 MG Tabs tablet Commonly known as: ELIQUIS Take 2 tablets (10 mg total) by mouth 2 (two) times daily for 7 days, THEN 1 tablet (5 mg total) 2 (two) times daily. Start taking on: December 01, 2023 What changed: See the new instructions.  aspirin EC 81 MG tablet Take 81 mg by mouth daily.   Benefiber Powd Take 4 g by mouth See admin instructions. Mix 4 grams (1 teaspoonful) into a desired beverage and drink in the morning   Culturelle Caps Take 1 capsule by mouth in the morning.   fluticasone 50 MCG/ACT nasal spray Commonly known as: FLONASE Place 2 sprays into both nostrils See admin instructions. Instill 2 sprays into both nostrils in the morning and evening   hydrocortisone 1 % ointment Apply 1 Application topically 2 (two) times daily as needed for itching.   ibuprofen 200 MG tablet Commonly known as: ADVIL Take  400 mg by mouth every 6 (six) hours as needed for mild pain (pain score 1-3) (lower back pain).   ketoconazole 2 % shampoo Commonly known as: NIZORAL Apply 1 Application topically See admin instructions. Use as a shampoo once a week   loratadine 10 MG tablet Commonly known as: CLARITIN Take 10 mg by mouth See admin instructions. Take 10 mg by mouth in the morning and evening   multivitamin with minerals Tabs tablet Take 1 tablet by mouth daily with breakfast.   PRESCRIPTION MEDICATION CPAP- At bedtime   rosuvastatin 5 MG tablet Commonly known as: CRESTOR Take 1 tablet (5 mg total) by mouth at bedtime.   Salonpas Pain Relief Patch Ptch Apply 1 patch topically daily as needed (for pain).   sulfamethoxazole-trimethoprim 800-160 MG tablet Commonly known as: Bactrim DS Take 1 tablet by mouth 2 (two) times daily.   tacrolimus 0.1 % ointment Commonly known as: PROTOPIC Apply 1 Application topically daily as needed (for rashes- affected areas).        Follow-up Information     Soundra Pilon, FNP. Schedule an appointment as soon as possible for a visit in 1 week(s).   Specialty: Family Medicine Contact information: [phone] [place] [address] [phone]         [facility] Follow up.   Specialty: Pulmonology Why: Call for lung nodule follow up if you haven't heard from them in 1 week Contact information: [address] [place] [address] [phone]                Discharge Instructions     Discharge instructions   Complete by: As directed    **IMPORTANT DISCHARGE INSTRUCTIONS**   From Dr. Maryfrances Bunnell: You were admitted for chest pain  Here, you had a CT chest that showed no new blood clots.  Your heart enzymes (troponin) were very slightly up and so we were considering if this were a small heart attack.    Given your work up by Dr. Excell Seltzer recently, and  your overall symptoms and testing here, our Cardiologist agrees that it seems unlikely to be a heart attack.  The theory that this was a small clot (that had already dissolved by the time you have your CT scan here) is possible.  It is also possible the pain was from the nodules we see on your scan.  If you have recurrent pain that is brief and resolves by itself, no further work up is needed.  If you have severe pain, if you feel you cannot catch your breath, if you feel that your symptoms are worsening, please seek care  In the meantime, call Dr. Jessica Priest office, let him know that we have sent a referral for you to have the lung nodules evaluated.  But ask  him what the current plan for work up/evaluation of the pancreas nodule is.  Is he planning repeat imaging with an MRI (which would show the mass in more detail?), repeat imaging with CT to evaluate for growth?  Or referral to a GI specialist for endoscopic ultrasound?  Resume all your home medicines   Take a dose of apixaban/Eliquis tonight around 10pm or 11pm  Then resume your normal schedule tomorrow   Increase activity slowly   Complete by: As directed        Discharge Exam: Filed Weights   12/09/23 2138  Weight: 103.3 kg    General: Pt is alert, awake, not in acute distress Cardiovascular: RRR, nl S1-S2, no murmurs appreciated.   No LE edema.   Respiratory: Normal respiratory rate and rhythm.  CTAB without rales or wheezes. Abdominal: Abdomen soft and non-tender.  No distension or HSM.   Neuro/Psych: Strength symmetric in upper and lower extremities.  Judgment and insight appear normal .   Condition at discharge: good  The results of significant diagnostics from this hospitalization (including imaging, microbiology, ancillary and laboratory) are listed below for reference.   Imaging Studies: CT Angio Chest PE W and/or Wo Contrast Result Date: 12/09/2023 CLINICAL DATA:  Chest pain. Pulmonary nodules on recent CT. *  Tracking Code: BO * EXAM: CT ANGIOGRAPHY CHEST WITH CONTRAST TECHNIQUE: Multidetector CT imaging of the chest was performed using the standard protocol during bolus administration of intravenous contrast. Multiplanar CT image reconstructions and MIPs were obtained to evaluate the vascular anatomy. RADIATION DOSE REDUCTION: This exam was performed according to the departmental dose-optimization program which includes automated exposure control, adjustment of the mA and/or kV according to patient size and/or use of iterative reconstruction technique. CONTRAST:  75mL OMNIPAQUE IOHEXOL 350 MG/ML SOLN COMPARISON:  CT 11/29/2023 FINDINGS: Cardiovascular: No filling defects within the pulmonary arteries to suggest acute pulmonary embolism. Mediastinum/Nodes: No axillary or supraclavicular adenopathy. No mediastinal or hilar adenopathy. No pericardial fluid. Esophagus normal. Lungs/Pleura: Bilateral pulmonary nodules of varying size are again noted. These nodules are new from CT 09/17/2023. For example 15 mm nodule in the RIGHT lower lobe. 15 mm nodule the RIGHT upper lobe. Approximately 10-12 nodules per lung. These nodules are increased in size from exam (image 11/29/2023) Upper Abdomen: Fullness in the head of pancreas pancreas again noted with low attenuation. Adrenal glands normal. Retroperitoneal lymph node deep to the aorta IVC on image 163 measures 10 mm Musculoskeletal: No aggressive osseous lesion. Review of the MIP images confirms the above findings. IMPRESSION: 1. No evidence acute pulmonary embolism. 2. Bilateral pulmonary nodules are concerning for metastatic disease. Pulmonary infection would be secondary consideration. 3. Fullness within the head of the pancreas is indeterminate. Relatively stable over comparison exams. Electronically Signed   By: Genevive Bi M.D.   On: 12/09/2023 17:07   DG Chest Port 1 View Result Date: 12/09/2023 CLINICAL DATA:  Known left upper lobe pulmonary embolism with dyspnea  EXAM: PORTABLE CHEST 1 VIEW COMPARISON:  Chest radiograph dated 11/29/2023 FINDINGS: Normal lung volumes. No focal consolidations. Known multifocal bilateral pulmonary nodules are better evaluated on CT. No pleural effusion or pneumothorax. The heart size and mediastinal contours are within normal limits. No acute osseous abnormality. IMPRESSION: No acute disease. Electronically Signed   By: Agustin Cree M.D.   On: 12/09/2023 15:22   VAS [place] LOWER EXTREMITY VENOUS (DVT) Result Date: 11/30/2023  Lower Venous DVT Study Patient Name:  ZAVIEN CLUBB  Date of Exam:   11/30/2023  Medical Rec #: [id number]           Accession #:    [id number] Date of Birth: Feb 06, 1941           Patient Gender: M Patient Age:   43 years Exam Location:  [facility] Procedure:      VAS [place] LOWER EXTREMITY VENOUS (DVT) Referring Phys: Jaye OPYD --------------------------------------------------------------------------------  Indications: Pulmonary embolism, and Edema.  Comparison Study: No prior study on file Performing Technologist: Sherren Kerns RVS  Examination Guidelines: A complete evaluation includes B-mode imaging, spectral Doppler, color Doppler, and power Doppler as needed of all accessible portions of each vessel. Bilateral testing is considered an integral part of a complete examination. Limited examinations for reoccurring indications may be performed as noted. The reflux portion of the exam is performed with the patient in reverse Trendelenburg.  +---------+---------------+---------+-----------+----------+--------------+ RIGHT    CompressibilityPhasicitySpontaneityPropertiesThrombus Aging +---------+---------------+---------+-----------+----------+--------------+ CFV      Full           Yes      Yes                                 +---------+---------------+---------+-----------+----------+--------------+ SFJ      Full                                                         +---------+---------------+---------+-----------+----------+--------------+ FV Prox  Full                                                        +---------+---------------+---------+-----------+----------+--------------+ FV Mid   Full                                                        +---------+---------------+---------+-----------+----------+--------------+ FV DistalFull                                                        +---------+---------------+---------+-----------+----------+--------------+ PFV      Full                                                        +---------+---------------+---------+-----------+----------+--------------+ POP      Full           Yes      Yes                                 +---------+---------------+---------+-----------+----------+--------------+ PERO     None  Acute          +---------+---------------+---------+-----------+----------+--------------+ Gastroc  Full                                                        +---------+---------------+---------+-----------+----------+--------------+   +---------+---------------+---------+-----------+----------+--------------+ LEFT     CompressibilityPhasicitySpontaneityPropertiesThrombus Aging +---------+---------------+---------+-----------+----------+--------------+ CFV      Full           Yes      Yes                                 +---------+---------------+---------+-----------+----------+--------------+ SFJ      Full                                                        +---------+---------------+---------+-----------+----------+--------------+ FV Prox  Full           Yes      Yes                                 +---------+---------------+---------+-----------+----------+--------------+ FV Mid   Full                                                         +---------+---------------+---------+-----------+----------+--------------+ FV DistalFull                                                        +---------+---------------+---------+-----------+----------+--------------+ PFV      Full           Yes      Yes                                 +---------+---------------+---------+-----------+----------+--------------+ POP      Full           Yes      Yes                                 +---------+---------------+---------+-----------+----------+--------------+ PTV      None                                         Acute          +---------+---------------+---------+-----------+----------+--------------+ PERO     None                                         Acute          +---------+---------------+---------+-----------+----------+--------------+  TP Trunk None                                         Acute          +---------+---------------+---------+-----------+----------+--------------+     Summary: RIGHT: - Findings consistent with acute deep vein thrombosis involving the right peroneal veins.  - No cystic structure found in the popliteal fossa.  LEFT: - Findings consistent with acute deep vein thrombosis involving the left posterior tibial veins, left peroneal veins, and TP Trunk.  - A cystic structure is found in the popliteal fossa.  *See table(s) above for measurements and observations. Electronically signed by Sherald Hess MD on 11/30/2023 at 11:58:35 AM.    Final    ECHOCARDIOGRAM COMPLETE Result Date: 11/30/2023    ECHOCARDIOGRAM REPORT   Patient Name:   Timothy Wilcox Date of Exam: 11/30/2023 Medical Rec #:  [id number]          Height:       67.0 in Accession #:    [id number]         Weight:       231.4 lb Date of Birth:  12-21-1940          BSA:          2.152 m Patient Age:    82 years           BP:           136/76 mmHg Patient Gender: M                  HR:           79 bpm. Exam Location:  Inpatient  Procedure: 2D Echo, Cardiac Doppler and Color Doppler (Both Spectral and Color            Flow Doppler were utilized during procedure). Indications:    Chest pain, Pulmonary embolus  History:        Patient has prior history of Echocardiogram examinations, most                 recent 03/19/2018. CAD, Acute Pulmonary Embolus,                 Signs/Symptoms:Shortness of Breath; Risk Factors:Dyslipidemia.  Sonographer:    Rosaland Lao Sonographer#2:  Delcie Roch RDCS Referring Phys: [id number] Ichael S OPYD IMPRESSIONS  1. Left ventricular ejection fraction, by estimation, is 60 to 65%. The left ventricle has normal function. The left ventricle has no regional wall motion abnormalities. Left ventricular diastolic parameters are consistent with Grade I diastolic dysfunction (impaired relaxation).  2. Right ventricular systolic function is normal. The right ventricular size is normal.  3. The mitral valve is abnormal. No evidence of mitral valve regurgitation. No evidence of mitral stenosis.  4. The aortic valve is tricuspid. There is moderate calcification of the aortic valve. There is moderate thickening of the aortic valve. Aortic valve regurgitation is not visualized. Aortic valve sclerosis is present, with no evidence of aortic valve stenosis.  5. The inferior vena cava is normal in size with greater than 50% respiratory variability, suggesting right atrial pressure of 3 mmHg. FINDINGS  Left Ventricle: Left ventricular ejection fraction, by estimation, is 60 to 65%. The left ventricle has normal function. The left ventricle has no regional wall motion abnormalities. Strain imaging was not performed. The left ventricular internal cavity  size was normal  in size. There is no left ventricular hypertrophy. Left ventricular diastolic parameters are consistent with Grade I diastolic dysfunction (impaired relaxation). Right Ventricle: The right ventricular size is normal. No increase in right ventricular wall  thickness. Right ventricular systolic function is normal. Left Atrium: Left atrial size was normal in size. Right Atrium: Right atrial size was normal in size. Pericardium: There is no evidence of pericardial effusion. Mitral Valve: The mitral valve is abnormal. There is mild thickening of the mitral valve leaflet(s). There is mild calcification of the mitral valve leaflet(s). No evidence of mitral valve regurgitation. No evidence of mitral valve stenosis. Tricuspid Valve: The tricuspid valve is normal in structure. Tricuspid valve regurgitation is trivial. No evidence of tricuspid stenosis. Aortic Valve: The aortic valve is tricuspid. There is moderate calcification of the aortic valve. There is moderate thickening of the aortic valve. Aortic valve regurgitation is not visualized. Aortic valve sclerosis is present, with no evidence of aortic valve stenosis. Pulmonic Valve: The pulmonic valve was normal in structure. Pulmonic valve regurgitation is trivial. No evidence of pulmonic stenosis. Aorta: The aortic root is normal in size and structure. Venous: The inferior vena cava is normal in size with greater than 50% respiratory variability, suggesting right atrial pressure of 3 mmHg. IAS/Shunts: The interatrial septum was not well visualized. Additional Comments: 3D imaging was not performed.  LEFT VENTRICLE PLAX 2D LVIDd:         4.70 cm   Diastology LVIDs:         2.80 cm   LV e' medial:    5.77 cm/s LV PW:         1.00 cm   LV E/e' medial:  10.5 LV IVS:        0.90 cm   LV e' lateral:   4.19 cm/s LVOT diam:     2.00 cm   LV E/e' lateral: 14.5 LV SV:         52 LV SV Index:   24 LVOT Area:     3.14 cm  RIGHT VENTRICLE RV Basal diam:  3.90 cm RV Mid diam:    4.10 cm RV S prime:     21.60 cm/s TAPSE (M-mode): 2.4 cm LEFT ATRIUM           Index        RIGHT ATRIUM           Index LA diam:      3.30 cm 1.53 cm/m   RA Area:     10.90 cm LA Vol (A2C): 47.8 ml 22.22 ml/m  RA Volume:   21.30 ml  9.90 ml/m LA Vol  (A4C): 42.1 ml 19.57 ml/m  AORTIC VALVE LVOT Vmax:   80.90 cm/s LVOT Vmean:  55.800 cm/s LVOT VTI:    0.166 m  AORTA Ao Root diam: 3.40 cm Ao Asc diam:  3.70 cm MITRAL VALVE MV Area (PHT): 2.99 cm     SHUNTS MV Decel Time: 254 msec     Systemic VTI:  0.17 m MV E velocity: 60.80 cm/s   Systemic Diam: 2.00 cm MV A velocity: 102.00 cm/s MV E/A ratio:  0.60 Charlton Haws MD Electronically signed by Charlton Haws MD Signature Date/Time: 11/30/2023/10:21:00 AM    Final    CT Angio Chest PE W/Cm &/Or Wo Cm Result Date: 11/29/2023 CLINICAL DATA:  Chest pain.  Concern for pulmonary embolism. EXAM: CT ANGIOGRAPHY CHEST WITH CONTRAST TECHNIQUE: Multidetector CT imaging of the chest was performed using the standard protocol  during bolus administration of intravenous contrast. Multiplanar CT image reconstructions and MIPs were obtained to evaluate the vascular anatomy. RADIATION DOSE REDUCTION: This exam was performed according to the departmental dose-optimization program which includes automated exposure control, adjustment of the mA and/or kV according to patient size and/or use of iterative reconstruction technique. CONTRAST:  75mL OMNIPAQUE IOHEXOL 350 MG/ML SOLN COMPARISON:  03/18/2018. FINDINGS: Cardiovascular: The heart is enlarged and there is no pericardial effusion. Multi-vessel coronary artery calcifications are noted. There is atherosclerotic calcification of the aorta without evidence of aneurysm. The pulmonary trunk is normal in caliber. Small segmental and subsegmental pulmonary emboli are noted in the left upper lobe, best seen on sagittal image 130. There is dilatation of the right ventricle, which may be associated with underlying right heart strain. Mediastinum/Nodes: No mediastinal, hilar, or axillary lymphadenopathy. Calcified lymph nodes are noted at the mediastinum and right hilum. The thyroid gland, trachea, and esophagus are within normal limits. There is a small hiatal hernia. Lungs/Pleura: No  consolidation, effusion or pneumothorax is seen. Scattered pulmonary nodules are present bilaterally measuring up to 1.4 cm in the right lower lobe. Upper Abdomen: No acute abnormality. Musculoskeletal: Degenerative changes are present in the thoracic spine. No acute or suspicious osseous abnormality is seen. Review of the MIP images confirms the above findings. IMPRESSION: 1. Small segmental and subsegmental pulmonary emboli in the left upper lobe. There is dilatation of the right ventricle which may be associated with right heart strain. 2. Multiple pulmonary nodules bilaterally measuring up to 1.4 cm, concerning for neoplastic process, less likely infectious or inflammatory. Non-contrast chest CT at 3-6 months is recommended. If the nodules are stable at time of repeat CT, then future CT at 18-24 months (from today's scan) is considered optional for low-risk patients, but is recommended for high-risk patients. This recommendation follows the consensus statement: Guidelines for Management of Incidental Pulmonary Nodules Detected on CT Images: From the Fleischner Society 2017; Radiology 2017; 284:228-243. 3. Coronary artery calcifications. 4. Small hiatal hernia. 5. Aortic atherosclerosis. Critical Value/emergent results were called by telephone at the time of interpretation on 11/29/2023 at 9:16 pm to provider [facility] , who verbally acknowledged these results. Electronically Signed   By: Thornell Sartorius M.D.   On: 11/29/2023 21:16   DG Chest 2 View Result Date: 11/29/2023 CLINICAL DATA:  Chest pain EXAM: CHEST - 2 VIEW COMPARISON:  03/19/2018 FINDINGS: Heart normal size no mediastinal contours within normal limits. Aortic atherosclerosis. No confluent opacities or effusions. No acute bony abnormality. IMPRESSION: No active cardiopulmonary disease. Electronically Signed   By: Charlett Nose M.D.   On: 11/29/2023 19:13    Microbiology: Results for orders placed or performed during the hospital encounter of  11/29/23  Resp panel by RT-PCR (RSV, Flu A&B, Covid) Anterior Nasal Swab     Status: None   Collection Time: 11/29/23  6:47 PM   Specimen: Anterior Nasal Swab  Result Value Ref Range Status   SARS Coronavirus 2 by RT PCR NEGATIVE NEGATIVE Final   Influenza A by PCR NEGATIVE NEGATIVE Final   Influenza B by PCR NEGATIVE NEGATIVE Final    Comment: (NOTE) The Xpert Xpress SARS-CoV-2/FLU/RSV plus assay is intended as an aid in the diagnosis of influenza from Nasopharyngeal swab specimens and should not be used as a sole basis for treatment. Nasal washings and aspirates are unacceptable for Xpert Xpress SARS-CoV-2/FLU/RSV testing.  Fact Sheet for Patients: BloggerCourse.com  Fact Sheet for Healthcare Providers: SeriousBroker.it  This test is not yet  approved or cleared by the [place] and has been authorized for detection and/or diagnosis of SARS-CoV-2 by FDA under an Emergency Use Authorization (EUA). This EUA will remain in effect (meaning this test can be used) for the duration of the COVID-19 declaration under Section 564(b)(1) of the Act, 21 U.S.C. section 360bbb-3(b)(1), unless the authorization is terminated or revoked.     Resp Syncytial Virus by PCR NEGATIVE NEGATIVE Final    Comment: (NOTE) Fact Sheet for Patients: BloggerCourse.com  Fact Sheet for Healthcare Providers: SeriousBroker.it  This test is not yet approved or cleared by the [place] FDA and has been authorized for detection and/or diagnosis of SARS-CoV-2 by FDA under an Emergency Use Authorization (EUA). This EUA will remain in effect (meaning this test can be used) for the duration of the COVID-19 declaration under Section 564(b)(1) of the Act, 21 U.S.C. section 360bbb-3(b)(1), unless the authorization is terminated or revoked.  Performed at [facility], [address] [place],  [place] [address]     Labs: CBC: Recent Labs  Lab 12/09/23 1251 12/10/23 0527  WBC 7.6 8.5  NEUTROABS 5.5  --   HGB 14.6 13.0  HCT 44.1 39.4  MCV 97.6 98.7  PLT 243 214   Basic Metabolic Panel: Recent Labs  Lab 12/09/23 1251 12/10/23 0527  NA 137 133*  K 4.0 3.9  CL 108 103  CO2 19* 20*  GLUCOSE 195* 117*  BUN 16 15  CREATININE 0.99 0.90  CALCIUM 9.6 9.0   Liver Function Tests: Recent Labs  Lab 12/09/23 1251  AST 26  ALT 29  ALKPHOS 69  BILITOT 0.6  PROT 7.2  ALBUMIN 3.7   CBG: No results for input(s): "GLUCAP" in the last 168 hours.  Discharge time spent: approximately 45 minutes spent on discharge counseling, evaluation of patient on day of discharge, and coordination of discharge planning with nursing, social work, pharmacy and case management  Signed: Alberteen Sam, MD [facility] 12/10/2023

## 2023-12-10 NOTE — Progress Notes (Addendum)
 Cardiology Consultation   Patient ID: Timothy Wilcox MRN: [id number]; DOB: 1941/05/17  Admit date: 12/09/2023 Date of Consult: 12/10/2023  PCP:  Soundra Pilon, FNP   [facility] Providers Cardiologist:  Tonny Bollman, MD        Patient Profile:   Timothy Wilcox is a 83 y.o. male with a hx of CAD with prior DES-RCA and LCX, hypertension, hyperlipidemia, OSA on CPAP, and recent PE/DVT on Eliquis who is being seen 12/10/2023 for the evaluation of chest pain at the request of Dr. Maryfrances Bunnell.  History of Present Illness:   Mr. [name] has a history of STEMI in 2018 treated with DES-RCA.  He did not tolerate Brilinta secondary to shortness of breath and was transition to clopidogrel.  He had progressive dyspnea in 2019 and underwent repeat heart catheterization that showed patent RCA stent but stenosis of the LCx treated with DES.  He was continued on aspirin and Plavix.  Unfortunately he continued to have shortness of breath and underwent VQ scan, CTA PE study, echocardiogram, and cardiopulmonary exercise testing.  He also saw pulmonology.  No clear cause of his dyspnea was found.  He was seen by DOD Dr. Carolan Clines 07/18/2023 for dizziness and fatigue.  He underwent nuclear stress test that did not show any ischemia.  When evaluated by Dr. Excell Seltzer 10/31/2023 he reportedly was back to baseline.  He was hospitalized 11/29/2023 with shortness of breath and chest pain found to have bilateral PE with right heart strain and DVT.  Notably small segmental and subsegmental PE in the left upper lobe.  He had bilateral lower extremity DVTs.  He was started on heparin that was later transition to treatment dose Eliquis.  CTA also showed multiple pulmonary nodules bilaterally.  Course was complicated by acute hematuria after starting full anticoagulation.  He was instructed to follow-up with urology outpatient.  He was placed on empiric antibiotic.  Echocardiogram that admission showed an LVEF 60  to 65%, no RWMA, grade 1 DD, normal RV function, and no significant valvular disease. Of note, troponin during this hospitalization was 26 --> 31 (11/30/23).  Unfortunately he presented back to the ER 12/10/2023 with chest pain.  CTA was negative for PE.  He was notably short of breath in triage.  He was given aspirin 324 mg.  Workup notable for troponin elevated to 100 --> 110.  BNP 30.4 Hb 14.6  EKG revealed sinus tachycardia with HR 100, poor R wave progression which has been seen on prior tracings.  Cardiology consulted for chest pain concerning for angina.  On exam, he describes point specific chest pain in his left upper chest in the general area of his chest pain last month in the setting of PE. He reports that chest pain was the same quality and intensity as last month and resolved similarly in 30 minutes without intervention. He does not have NTG at home. Fortunately he has not had a recurrence of chest pain.   He recounts prior angina in 2018 in the setting of STEMI that was substernal, squeezing in nature, and radiated down his left arm. The current chest pain located in his left upper chest and was more of a sharp pain. He notes that he has felt unwell since Oct 2024 and has not been able to exercise as much as he normally does (recumbent stair stepper and walking 1 mil three times per week). He has not walked his normal routine in about 2 months. He states that he  felt better after the heparin gtt last month - like his "system was flushed." He reports weakness, fatigue, and reduced exercise tolerance since last fall.    Past Medical History:  Diagnosis Date   Arthritis    CAD (coronary artery disease) 02/26/2018   A. Inf STEMI 6/18: LHC - pLCx 60, dRCA 100, EF 45-50 >> PCI:  DES to RCA // B. Echo 8/18: EF 60-65, nwm, grade 1 dd, MAC. C. 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   Hyperlipemia    intol to some statins   IBS (irritable  bowel syndrome)    Medication intolerance    severe SOB due to Brilinta >> changed to Plavix   Renal disorder    kidney stones   Seasonal allergies    Sinus congestion    chronic   Sleep apnea    uses a c-pap    Past Surgical History:  Procedure Laterality Date   COLONOSCOPY     CORONARY STENT INTERVENTION N/A 04/03/2017   Procedure: Coronary Stent Intervention;  Surgeon: Tonny Bollman, MD;  Location: [facility] INVASIVE CV LAB;  Service: Cardiovascular;  Laterality: N/A;   CORONARY STENT INTERVENTION N/A 02/27/2018   Procedure: CORONARY STENT INTERVENTION;  Surgeon: Lennette Bihari, MD;  Location: [facility] INVASIVE CV LAB;  Service: Cardiovascular;  Laterality: N/A;   INCISION AND DRAINAGE  2011   infected finger   LEFT HEART CATH AND CORONARY ANGIOGRAPHY N/A 04/03/2017   Procedure: Left Heart Cath and Coronary Angiography;  Surgeon: Tonny Bollman, MD;  Location: [facility] INVASIVE CV LAB;  Service: Cardiovascular;  Laterality: N/A;   LEFT HEART CATH AND CORONARY ANGIOGRAPHY N/A 02/27/2018   Procedure: LEFT HEART CATH AND CORONARY ANGIOGRAPHY;  Surgeon: Lennette Bihari, MD;  Location: [facility] INVASIVE CV LAB;  Service: Cardiovascular;  Laterality: N/A;   SHOULDER ARTHROSCOPY WITH ROTATOR CUFF REPAIR AND SUBACROMIAL DECOMPRESSION Right 01/29/2013   Procedure: RIGHT SHOULDER ARTHROSCOPY WITH SUBACROMIAL DECOMPRESSION, THREE TENDON ROTATOR CUFF REPAIR;  Surgeon: Wyn Forster., MD;  Location: [facility];  Service: Orthopedics;  Laterality: Right;   TOE DEBRIDEMENT     rt toe cyst     Home Medications:  Prior to Admission medications   Medication Sig Start Date End Date Taking? Authorizing Provider  allopurinol (ZYLOPRIM) 300 MG tablet Take 300 mg by mouth daily.   Yes [provider]  amLODipine (NORVASC) 5 MG tablet TAKE 1 TABLET BY MOUTH DAILY 11/14/23  Yes Tonny Bollman, MD  apixaban (ELIQUIS) 5 MG TABS tablet Take 2 tablets (10 mg total) by mouth 2 (two) times daily for 7  days, THEN 1 tablet (5 mg total) 2 (two) times daily. Patient taking differently: Take 5 mg by mouth in the morning and evening 12/01/23 03/07/24 Yes Deanna Artis, DO  aspirin EC 81 MG tablet Take 81 mg by mouth daily.   Yes [provider]  fluticasone (FLONASE) 50 MCG/ACT nasal spray Place 2 sprays into both nostrils See admin instructions. Instill 2 sprays into both nostrils in the morning and evening   Yes [provider]  hydrocortisone 1 % ointment Apply 1 Application topically 2 (two) times daily as needed for itching.   Yes [provider]  ibuprofen (ADVIL,MOTRIN) 200 MG tablet Take 400 mg by mouth every 6 (six) hours as needed for mild pain (pain score 1-3) (lower back pain).   Yes [provider]  ketoconazole (NIZORAL)  2 % shampoo Apply 1 Application topically See admin instructions. Use as a shampoo once a week   Yes [provider]  Lactobacillus Rhamnosus, GG, (CULTURELLE) CAPS Take 1 capsule by mouth in the morning.   Yes [provider]  loratadine (CLARITIN) 10 MG tablet Take 10 mg by mouth See admin instructions. Take 10 mg by mouth in the morning and evening   Yes [provider]  Menthol-Methyl Salicylate (SALONPAS PAIN RELIEF PATCH) PTCH Apply 1 patch topically daily as needed (for pain).   Yes [provider]  Multiple Vitamin (MULTIVITAMIN WITH MINERALS) TABS Take 1 tablet by mouth daily with breakfast.   Yes [provider]  PRESCRIPTION MEDICATION CPAP- At bedtime   Yes [provider]  rosuvastatin (CRESTOR) 5 MG tablet Take 1 tablet (5 mg total) by mouth at bedtime. 12/01/23  Yes Deanna Artis, DO  tacrolimus (PROTOPIC) 0.1 % ointment Apply 1 Application topically daily as needed (for rashes- affected areas).   Yes [provider]  Wheat Dextrin (BENEFIBER) POWD Take 4 g by mouth See admin instructions. Mix 4 grams (1 teaspoonful) into a desired beverage and drink in the  morning   Yes [provider]  sulfamethoxazole-trimethoprim (BACTRIM DS) 800-160 MG tablet Take 1 tablet by mouth 2 (two) times daily. Patient not taking: Reported on 12/09/2023 12/01/23   Deanna Artis, DO    Inpatient Medications: Scheduled Meds:  allopurinol  300 mg Oral Daily   amLODipine  5 mg Oral Daily   aspirin EC  81 mg Oral Daily   fluticasone  1 spray Each Nare BID   loratadine  10 mg Oral QHS   rosuvastatin  5 mg Oral QHS   sodium chloride flush  3 mL Intravenous Q12H   Continuous Infusions:  heparin     PRN Meds: acetaminophen **OR** acetaminophen, bisacodyl, nitroGLYCERIN, ondansetron **OR** ondansetron (ZOFRAN) IV, senna-docusate  Allergies:    Allergies  Allergen Reactions   Brilinta [Ticagrelor] Shortness Of Breath and Other (See Comments)    Dizziness, weakness/Caused hospital admission   Plavix [Clopidogrel] Shortness Of Breath and Other (See Comments)    Fatigue, also   Statins Other (See Comments)    Pain in joints, can't move knees   Clotrimazole-Betamethasone Other (See Comments)    Patient cannot recall symptoms   Potassium Citrate Diarrhea and Other (See Comments)    Severe stomach pain    Tagamet [Cimetidine] Other (See Comments)    Gynecomastia    Tizanidine Hcl Other (See Comments)    Pt cannot recall symptoms   Zyrtec [Cetirizine Hcl] Other (See Comments)    Pt cannot recall symptoms   Dicyclomine Anxiety and Other (See Comments)    Interfered with sleep- made nervous    Social History:   Social History   Socioeconomic History   Marital status: Married    Spouse name: Not on file   Number of children: Not on file   Years of education: Not on file   Highest education level: Not on file  Occupational History   Not on file  Tobacco Use   Smoking status: Never   Smokeless tobacco: Never  Vaping Use   Vaping status: Never Used  Substance and Sexual Activity   Alcohol use: Yes    Comment: a bottle of wine every night    Drug use: No   Sexual activity: Not on file  Other Topics Concern   Not on file  Social History Narrative   Not  on file   Social Drivers of Health   Financial Resource Strain: Not on file  Food Insecurity: No Food Insecurity (12/09/2023)   Hunger Vital Sign    Worried About Running Out of Food in the Last Year: Never true    Ran Out of Food in the Last Year: Never true  Transportation Needs: No Transportation Needs (12/09/2023)   PRAPARE - Administrator, Civil Service (Medical): No    Lack of Transportation (Non-Medical): No  Physical Activity: Not on file  Stress: Not on file  Social Connections: Moderately Integrated (12/09/2023)   Social Connection and Isolation Panel [NHANES]    Frequency of Communication with Friends and Family: More than three times a week    Frequency of Social Gatherings with Friends and Family: Once a week    Attends Religious Services: Never    Database administrator or Organizations: Yes    Attends Engineer, structural: More than 4 times per year    Marital Status: Married  Catering manager Violence: Not At Risk (12/09/2023)   Humiliation, Afraid, Rape, and Kick questionnaire    Fear of Current or Ex-Partner: No    Emotionally Abused: No    Physically Abused: No    Sexually Abused: No    Family History:    Family History  Problem Relation Age of Onset   Skin cancer Mother    Pancreatic cancer Mother    Skin cancer Father      ROS:  Please see the history of present illness.   All other ROS reviewed and negative.     Physical Exam/Data:   Vitals:   12/09/23 2300 12/10/23 0125 12/10/23 0457 12/10/23 0900  BP: (!) 146/82 120/85 125/79 122/65  Pulse: 91 73 71 86  Resp:  14 17 17   Temp:  98 F (36.7 C) 98 F (36.7 C) 98.5 F (36.9 C)  TempSrc:    Oral  SpO2: 99% 98% 98% 96%  Weight:      Height:        Intake/Output Summary (Last 24 hours) at 12/10/2023 1006 Last data filed at 12/10/2023 0900 Gross per 24 hour   Intake 700 ml  Output --  Net 700 ml      12/09/2023    9:38 PM 11/30/2023    2:10 PM 11/29/2023   10:44 PM  Last 3 Weights  Weight (lbs) 227 lb 12.8 oz 228 lb 231 lb 6.4 oz  Weight (kg) 103.329 kg 103.42 kg 104.962 kg     Body mass index is 35.68 kg/m.  General: obese male appears younger than stated age HEENT: normal Neck: no JVD Vascular: No carotid bruits; Distal pulses 2+ bilaterally Cardiac:  normal S1, S2; RRR; no murmur  Lungs:  clear to auscultation bilaterally, no wheezing, rhonchi or rales  Abd: soft, nontender, no hepatomegaly  Ext: no edema Musculoskeletal:  No deformities, BUE and BLE strength normal and equal Skin: warm and dry  Neuro:  CNs 2-12 intact, no focal abnormalities noted Psych:  Normal affect   EKG:  The EKG was personally reviewed and demonstrates:  sinus tachycardia with HR 100, poor R wave progression which has been seen on prior tracings. Telemetry:  Telemetry was personally reviewed and demonstrates:  sinus rhythm with HR 70s  Relevant CV Studies:  Echo 11/30/23:  1. Left ventricular ejection fraction, by estimation, is 60 to 65%. The  left ventricle has normal function. The left ventricle has no regional  wall motion  abnormalities. Left ventricular diastolic parameters are  consistent with Grade I diastolic  dysfunction (impaired relaxation).   2. Right ventricular systolic function is normal. The right ventricular  size is normal.   3. The mitral valve is abnormal. No evidence of mitral valve  regurgitation. No evidence of mitral stenosis.   4. The aortic valve is tricuspid. There is moderate calcification of the  aortic valve. There is moderate thickening of the aortic valve. Aortic  valve regurgitation is not visualized. Aortic valve sclerosis is present,  with no evidence of aortic valve  stenosis.   5. The inferior vena cava is normal in size with greater than 50%  respiratory variability, suggesting right atrial pressure of 3 mmHg.     Laboratory Data:  High Sensitivity Troponin:   Recent Labs  Lab 11/29/23 1838 11/30/23 0010 12/01/23 0416 12/09/23 1251 12/09/23 1445  TROPONINIHS 26* 31* 18* 100* 110*     Chemistry Recent Labs  Lab 12/09/23 1251 12/10/23 0527  NA 137 133*  K 4.0 3.9  CL 108 103  CO2 19* 20*  GLUCOSE 195* 117*  BUN 16 15  CREATININE 0.99 0.90  CALCIUM 9.6 9.0  GFRNONAA >60 >60  ANIONGAP 10 10    Recent Labs  Lab 12/09/23 1251  PROT 7.2  ALBUMIN 3.7  AST 26  ALT 29  ALKPHOS 69  BILITOT 0.6   Lipids No results for input(s): "CHOL", "TRIG", "HDL", "LABVLDL", "LDLCALC", "CHOLHDL" in the last 168 hours.  Hematology Recent Labs  Lab 12/09/23 1251 12/10/23 0527  WBC 7.6 8.5  RBC 4.52 3.99*  HGB 14.6 13.0  HCT 44.1 39.4  MCV 97.6 98.7  MCH 32.3 32.6  MCHC 33.1 33.0  RDW 13.2 13.2  PLT 243 214   Thyroid No results for input(s): "TSH", "FREET4" in the last 168 hours.  BNP Recent Labs  Lab 12/09/23 1251  BNP 30.4    DDimer No results for input(s): "DDIMER" in the last 168 hours.   Radiology/Studies:  CT Angio Chest PE W and/or Wo Contrast Result Date: 12/09/2023 CLINICAL DATA:  Chest pain. Pulmonary nodules on recent CT. * Tracking Code: BO * EXAM: CT ANGIOGRAPHY CHEST WITH CONTRAST TECHNIQUE: Multidetector CT imaging of the chest was performed using the standard protocol during bolus administration of intravenous contrast. Multiplanar CT image reconstructions and MIPs were obtained to evaluate the vascular anatomy. RADIATION DOSE REDUCTION: This exam was performed according to the departmental dose-optimization program which includes automated exposure control, adjustment of the mA and/or kV according to patient size and/or use of iterative reconstruction technique. CONTRAST:  75mL OMNIPAQUE IOHEXOL 350 MG/ML SOLN COMPARISON:  CT 11/29/2023 FINDINGS: Cardiovascular: No filling defects within the pulmonary arteries to suggest acute pulmonary embolism. Mediastinum/Nodes: No  axillary or supraclavicular adenopathy. No mediastinal or hilar adenopathy. No pericardial fluid. Esophagus normal. Lungs/Pleura: Bilateral pulmonary nodules of varying size are again noted. These nodules are new from CT 09/17/2023. For example 15 mm nodule in the RIGHT lower lobe. 15 mm nodule the RIGHT upper lobe. Approximately 10-12 nodules per lung. These nodules are increased in size from exam (image 11/29/2023) Upper Abdomen: Fullness in the head of pancreas pancreas again noted with low attenuation. Adrenal glands normal. Retroperitoneal lymph node deep to the aorta IVC on image 163 measures 10 mm Musculoskeletal: No aggressive osseous lesion. Review of the MIP images confirms the above findings. IMPRESSION: 1. No evidence acute pulmonary embolism. 2. Bilateral pulmonary nodules are concerning for metastatic disease. Pulmonary infection would be secondary consideration.  3. Fullness within the head of the pancreas is indeterminate. Relatively stable over comparison exams. Electronically Signed   By: Genevive Bi M.D.   On: 12/09/2023 17:07   DG Chest Port 1 View Result Date: 12/09/2023 CLINICAL DATA:  Known left upper lobe pulmonary embolism with dyspnea EXAM: PORTABLE CHEST 1 VIEW COMPARISON:  Chest radiograph dated 11/29/2023 FINDINGS: Normal lung volumes. No focal consolidations. Known multifocal bilateral pulmonary nodules are better evaluated on CT. No pleural effusion or pneumothorax. The heart size and mediastinal contours are within normal limits. No acute osseous abnormality. IMPRESSION: No acute disease. Electronically Signed   By: Agustin Cree M.D.   On: 12/09/2023 15:22     Assessment and Plan:   Chest pain - hs troponin 100 --> 110 - EKG appears stable compared to prior tracings - he describes chest pain very similar to the chest pain prompting evaluation last month in the setting of PE - he has not had a recurrence of chest pain this admission - it is possible his troponin elevation  is on the downward trend from his recent PE  - however, given know CAD and his reduced exercise tolerance over the last 6 months, may ultimately need definitive angiography to rule out CAD - will collect additional troponin this morning - if elevated, would recommend LHC   CAD - hx of STEMI 2018 successfully treated with DES-dRCA - 2019: PCI with DES-LCX - last ischemic evaluation 07/2023 with nuclear stress test showed no ischemia - he has been maintained on ASA and low dose crestor   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 On chronic anticoagulation - on eliquis - holding for now with ongoing workup for chest pain in the event he will need LHC - anticoagulation per primary   Hypertension - PTA on 5 mg amlodipine - BP controlled today, only mildly hypertensive on arrival   Hyperlipidemia with LDL goal < 70 Needs updated lipid panel On 5 mg crestor - continue   Bilateral pulmonary nodules - increased in size - recommend following up with pulmonology - if metastatic, likely underlying etiology for his 6 months of weakness/fatigue   Risk Assessment/Risk Scores:     TIMI Risk Score for Unstable Angina or Non-ST Elevation MI:   The patient's TIMI risk score is 5, which indicates a 26% risk of all cause mortality, new or recurrent myocardial infarction or need for urgent revascularization in the next 14 days.      For questions or updates, please contact [facility] Please consult [email or web address] for contact info under    Signed, Marcelino Duster, PA  12/10/2023 10:06 AM  I have seen and examined the patient along with Marcelino Duster, PA.  I have reviewed the chart, notes and new data.  I agree with PA/NP's note.  Key new complaints: I spent some time with him carefully reviewing the symptoms that he experienced in late February and the ones that he experienced yesterday.  They do sound very similar.  The pain was similar in quality with his previous pulmonary embolism,  although in a slightly different area of his left chest.  It has resolved completely.  It was different from his previous angina pectoris which is more deep and central/retrosternal. Key examination changes: Clinically euvolemic, normal rhythm, no evidence of lower extremity edema or tenderness, clear lungs. Key new findings / data: Follow-up CT angiogram performed just 10 days from the original CT does not show evidence of pericardial fluid or new pulmonary arterial embolism, but  does show new nodules compared to December 2024 and increased in size compared to 11/29/2023, raising concern for metastatic disease. Follow-up high-sensitivity troponin is a little higher, but essentially this is not a meaningful change (100-110-132).  This is not the pattern that we expect with an acute coronary syndrome.  ECG does not show new ischemic abnormalities.  Reviewed the images of his nuclear stress test performed last October which shows a completely normal pattern of perfusion.  PLAN:  It is quite possible that he had another small pulmonary embolism that we were unable to detect on CT angiography.  It is only been roughly 10 days since initiation of anticoagulation and he had significant clot burden in both legs, so repeat pulmonary embolism is not out of the question even though he was anticoagulated with Eliquis. The changes on the CT of the chest are concerning for possible metastatic disease.  This could explain recent problems with hypercoagulability. At this point, I do not think he would benefit from left heart catheterization/coronary angiography and would want to avoid interruption in his anticoagulants so early following pulmonary embolism.  Discussed with his primary Cardiologist, Dr. Excell Seltzer and he agrees no coronary evaluation at this time. Defer evaluation of his pulmonary nodules to the primary team.  Thurmon Fair, MD, [facility] [phone] 12/10/2023, 12:05 PM

## 2023-12-10 NOTE — TOC Initial Note (Signed)
 Transition of Care [facility]) - Initial/Assessment Note    Patient Details  Name: Timothy Wilcox MRN: [id number] Date of Birth: 1940-11-21  Transition of Care [facility]) CM/SW Contact:    Lanier Clam, RN Phone Number: 12/10/2023, 10:37 AM  Clinical Narrative:d/c plan home.                   Expected Discharge Plan: Home/Self Care Barriers to Discharge: Continued Medical Work up   Patient Goals and CMS Choice Patient states their goals for this hospitalization and ongoing recovery are:: Home CMS Medicare.gov Compare Post Acute Care list provided to:: Patient Choice offered to / list presented to : Patient [place] ownership interest in [facility].provided to:: Patient    Expected Discharge Plan and Services                                              Prior Living Arrangements/Services                       Activities of Daily Living   ADL Screening (condition at time of admission) Independently performs ADLs?: Yes (appropriate for developmental age) Is the patient deaf or have difficulty hearing?: No Does the patient have difficulty seeing, even when wearing glasses/contacts?: No Does the patient have difficulty concentrating, remembering, or making decisions?: No  Permission Sought/Granted                  Emotional Assessment              Admission diagnosis:  Pulmonary nodules [R91.8] Elevated troponin [R79.89] Chest pain [R07.9] Nonspecific chest pain [R07.9] Patient Active Problem List   Diagnosis Date Noted   Chest pain 12/09/2023   Essential hypertension 12/09/2023   Hematuria of undiagnosed cause 12/01/2023   History of pulmonary embolus (PE) 11/29/2023   Lung nodules 11/29/2023   Sleep apnea    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 [place] [address] Phone: [phone] Fax: [phone]     Social Drivers of Health (SDOH) Social History: SDOH Screenings   Food Insecurity: No Food Insecurity (12/09/2023)  Housing: Low Risk  (12/09/2023)  Transportation Needs: No Transportation Needs (12/09/2023)  Utilities: Not At Risk (12/09/2023)  Social Connections: Moderately Integrated (12/09/2023)  Tobacco Use: Low Risk  (12/09/2023)   SDOH Interventions:     Readmission Risk Interventions     No data to display

## 2023-12-10 NOTE — TOC Transition Note (Signed)
 Transition of Care [facility]) - Discharge Note   Patient Details  Name: Timothy Wilcox MRN: [id number] Date of Birth: Mar 08, 1941  [facility]) CM/SW Contact:  Lanier Clam, RN Phone Number: 12/10/2023, 2:11 PM   Clinical Narrative: d/c home.      Final next level of care: Home/Self Care Barriers to Discharge: No Barriers Identified   Patient Goals and CMS Choice Patient states their goals for this hospitalization and ongoing recovery are:: Home CMS Medicare.gov Compare Post Acute Care list provided to:: Patient Choice offered to / list presented to : Patient [place] ownership interest in [facility].provided to:: Patient    Discharge Placement                       Discharge Plan and Services Additional resources added to the After Visit Summary for                                       Social Drivers of Health (SDOH) Interventions SDOH Screenings   Food Insecurity: No Food Insecurity (12/09/2023)  Housing: Low Risk  (12/09/2023)  Transportation Needs: No Transportation Needs (12/09/2023)  Utilities: Not At Risk (12/09/2023)  Social Connections: Moderately Integrated (12/09/2023)  Tobacco Use: Low Risk  (12/09/2023)     Readmission Risk Interventions     No data to display

## 2023-12-10 NOTE — Plan of Care (Signed)

## 2023-12-10 NOTE — Care Management Obs Status (Signed)
 MEDICARE OBSERVATION STATUS NOTIFICATION   Patient Details  Name: Timothy Wilcox MRN: [id number] Date of Birth: April 25, 1941   Medicare Observation Status Notification Given:  Yes    MahabirOlegario Messier, RN 12/10/2023, 2:11 PM

## 2023-12-10 NOTE — Telephone Encounter (Signed)
 Please schedule patient for clinic consult for pulmonary nodules in next 2-3 weeks with me. Can use a blocked slot if needed.   Thanks, JD

## 2023-12-10 NOTE — Progress Notes (Signed)
AVS given and explained to patient, awaiting for ride.  

## 2023-12-10 NOTE — Progress Notes (Signed)
 PHARMACY - ANTICOAGULATION CONSULT NOTE  Pharmacy Consult for heparin Indication: chest pain/ACS  Allergies  Allergen Reactions   Brilinta [Ticagrelor] Shortness Of Breath and Other (See Comments)    Dizziness, weakness/Caused hospital admission   Plavix [Clopidogrel] Shortness Of Breath and Other (See Comments)    Fatigue, also   Statins Other (See Comments)    Pain in joints, can't move knees   Clotrimazole-Betamethasone Other (See Comments)    Patient cannot recall symptoms   Potassium Citrate Diarrhea and Other (See Comments)    Severe stomach pain    Tagamet [Cimetidine] Other (See Comments)    Gynecomastia    Tizanidine Hcl Other (See Comments)    Pt cannot recall symptoms   Zyrtec [Cetirizine Hcl] Other (See Comments)    Pt cannot recall symptoms   Dicyclomine Anxiety and Other (See Comments)    Interfered with sleep- made nervous    Patient Measurements: Height: 5\' 7"  (170.2 cm) Weight: 103.3 kg (227 lb 12.8 oz) IBW/kg (Calculated) : 66.1 Heparin Dosing Weight: 88.8 kg  Vital Signs: Temp: 98 F (36.7 C) (03/04 0457) Temp Source: Oral (03/03 2116) BP: 125/79 (03/04 0457) Pulse Rate: 71 (03/04 0457)  Labs: Recent Labs    12/09/23 1251 12/09/23 1445 12/10/23 0527  HGB 14.6  --  13.0  HCT 44.1  --  39.4  PLT 243  --  214  CREATININE 0.99  --  0.90  TROPONINIHS 100* 110*  --     Estimated Creatinine Clearance: 75 mL/min (by C-G formula based on SCr of 0.9 mg/dL).   Medical History: Past Medical History:  Diagnosis Date   Arthritis    CAD (coronary artery disease) 02/26/2018   A. Inf STEMI 6/18: LHC - pLCx 60, dRCA 100, EF 45-50 >> PCI:  DES to RCA // B. Echo 8/18: EF 60-65, nwm, grade 1 dd, MAC. C. 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   Hyperlipemia    intol to some statins   IBS (irritable bowel syndrome)    Medication intolerance    severe SOB due to Brilinta >> changed to Plavix   Renal  disorder    kidney stones   Seasonal allergies    Sinus congestion    chronic   Sleep apnea    uses a c-pap    Medications:  Medications Prior to Admission  Medication Sig Dispense Refill Last Dose/Taking   allopurinol (ZYLOPRIM) 300 MG tablet Take 300 mg by mouth daily.   12/09/2023 Morning   amLODipine (NORVASC) 5 MG tablet TAKE 1 TABLET BY MOUTH DAILY 90 tablet 3 12/09/2023 Morning   apixaban (ELIQUIS) 5 MG TABS tablet Take 2 tablets (10 mg total) by mouth 2 (two) times daily for 7 days, THEN 1 tablet (5 mg total) 2 (two) times daily. (Patient taking differently: Take 5 mg by mouth in the morning and evening) 60 tablet 1 12/09/2023 at  8:00 AM   aspirin EC 81 MG tablet Take 81 mg by mouth daily.   12/09/2023 at  8:00 AM   fluticasone (FLONASE) 50 MCG/ACT nasal spray Place 2 sprays into both nostrils See admin instructions. Instill 2 sprays into both nostrils in the morning and evening   12/09/2023   hydrocortisone 1 % ointment Apply 1 Application topically 2 (two) times daily as needed for itching.   Taking As Needed   ibuprofen (ADVIL,MOTRIN) 200 MG tablet Take 400 mg by mouth every 6 (  six) hours as needed for mild pain (pain score 1-3) (lower back pain).   Taking As Needed   ketoconazole (NIZORAL) 2 % shampoo Apply 1 Application topically See admin instructions. Use as a shampoo once a week   Past Week   Lactobacillus Rhamnosus, GG, (CULTURELLE) CAPS Take 1 capsule by mouth in the morning.   12/09/2023 Morning   loratadine (CLARITIN) 10 MG tablet Take 10 mg by mouth See admin instructions. Take 10 mg by mouth in the morning and evening   12/09/2023 Morning   Menthol-Methyl Salicylate (SALONPAS PAIN RELIEF PATCH) PTCH Apply 1 patch topically daily as needed (for pain).   Taking As Needed   Multiple Vitamin (MULTIVITAMIN WITH MINERALS) TABS Take 1 tablet by mouth daily with breakfast.   12/09/2023   PRESCRIPTION MEDICATION CPAP- At bedtime   12/08/2023 Bedtime   rosuvastatin (CRESTOR) 5 MG tablet Take 1  tablet (5 mg total) by mouth at bedtime.   12/08/2023 Bedtime   tacrolimus (PROTOPIC) 0.1 % ointment Apply 1 Application topically daily as needed (for rashes- affected areas).   Taking As Needed   Wheat Dextrin (BENEFIBER) POWD Take 4 g by mouth See admin instructions. Mix 4 grams (1 teaspoonful) into a desired beverage and drink in the morning   12/09/2023   sulfamethoxazole-trimethoprim (BACTRIM DS) 800-160 MG tablet Take 1 tablet by mouth 2 (two) times daily. (Patient not taking: Reported on 12/09/2023) 10 tablet 0 Not Taking   Scheduled:   allopurinol  300 mg Oral Daily   amLODipine  5 mg Oral Daily   aspirin EC  81 mg Oral Daily   fluticasone  1 spray Each Nare BID   loratadine  10 mg Oral QHS   rosuvastatin  5 mg Oral QHS   sodium chloride flush  3 mL Intravenous Q12H    Assessment: 83 YO male presenting with chest pain. Patient was recently hospitalized 2/21-2/23 with newly diagnosed PE and bilat DVT. He was started on Eliquis--completed loading dose of 10mg  BID and is now taking 5mg  BID. Eliquis was initially resumed during this admission with last dose being 3/3 @2252 . This admission, CTA PE negative and  troponin 100 >> 110. Pharmacy consulted for heparin dosing in setting of ACS.  Today, 12/10/23: Hgb 13, plts 214--stable Scr 0.90, CrCl ~75 ml/min No s/sx of bleeding reported Given recent administration of Eliquis, will avoid bolusing heparin and monitor with both aPTT and heparin levels until the two correlate (Eliquis can falsely elevate heparin levels)  Goal of Therapy:  Heparin level 0.3-0.7 units/ml aPTT 66-102 seconds Monitor platelets by anticoagulation protocol: Yes   Plan:  Start heparin gtt rate at 1100 units/hr today @1100  (~12hrs since last dose of Eliquis)  Check heparin level and aPTT 8hrs after heparin initiation Monitor CBC, aPTT/HL, and s/sx of bleeding daily F/u transition back to Eliquis   Cherylin Mylar, PharmD Clinical Pharmacist  3/4/20258:51 AM

## 2023-12-10 NOTE — Progress Notes (Signed)
   12/09/23 2300  BiPAP/CPAP/SIPAP  BiPAP/CPAP/SIPAP Pt Type Adult  BiPAP/CPAP/SIPAP Resmed  Mask Type Nasal mask;Nasal pillows  FiO2 (%) 21 %  Safety Check Completed by RT for Home Unit Yes, no issues noted  BiPAP/CPAP /SiPAP Vitals  Pulse Rate 91  BP (!) 146/82  SpO2 99 %  Bilateral Breath Sounds Clear;Diminished  MEWS Score/Color  MEWS Score 0  MEWS Score Color Timothy Wilcox

## 2023-12-11 NOTE — Telephone Encounter (Signed)
 thanks

## 2023-12-17 DIAGNOSIS — Z09 Encounter for follow-up examination after completed treatment for conditions other than malignant neoplasm: Secondary | ICD-10-CM | POA: Diagnosis not present

## 2023-12-17 DIAGNOSIS — R319 Hematuria, unspecified: Secondary | ICD-10-CM | POA: Diagnosis not present

## 2023-12-17 DIAGNOSIS — I10 Essential (primary) hypertension: Secondary | ICD-10-CM | POA: Diagnosis not present

## 2023-12-17 LAB — COMPREHENSIVE METABOLIC PANEL WITH GFR: EGFR: 72

## 2023-12-25 ENCOUNTER — Ambulatory Visit: Admitting: Pulmonary Disease

## 2023-12-25 ENCOUNTER — Encounter: Payer: Self-pay | Admitting: Pulmonary Disease

## 2023-12-25 VITALS — BP 132/71 | HR 87 | Ht 67.0 in | Wt 221.8 lb

## 2023-12-25 DIAGNOSIS — R1013 Epigastric pain: Secondary | ICD-10-CM

## 2023-12-25 DIAGNOSIS — K869 Disease of pancreas, unspecified: Secondary | ICD-10-CM | POA: Diagnosis not present

## 2023-12-25 DIAGNOSIS — J329 Chronic sinusitis, unspecified: Secondary | ICD-10-CM | POA: Diagnosis not present

## 2023-12-25 DIAGNOSIS — K219 Gastro-esophageal reflux disease without esophagitis: Secondary | ICD-10-CM | POA: Diagnosis not present

## 2023-12-25 DIAGNOSIS — R918 Other nonspecific abnormal finding of lung field: Secondary | ICD-10-CM

## 2023-12-25 MED ORDER — PANTOPRAZOLE SODIUM 40 MG PO TBEC: 40.0000 mg | DELAYED_RELEASE_TABLET | Freq: Every day | ORAL | 5 refills | Status: DC

## 2023-12-25 NOTE — Patient Instructions (Addendum)
 We will check a CT PET scan to evaluate the multiple pulmonary nodules  Start pantoprazole 40mg  daily, 30 minutes before breakfast  We will check labs for pancreatic inflammation  Follow up in 1 month, call sooner if needed

## 2023-12-25 NOTE — Progress Notes (Signed)
 Synopsis: Referred in March 2025 for pulmonary nodules  Subjective:   PATIENT ID: Timothy Wilcox GENDER: male DOB: [birth year], MRN: [id number]   HPI  Chief Complaint  Patient presents with   Follow-up    Pt states he not been doing well SOB,  fuzzy headed , double vision, spots since the hospital stay/ put on blood thiners   Timothy Wilcox "Timothy Wilcox" is a 83 year old male with history of CAD, OSA on CPAP who is referred to pulmonary clinic for pulmonary nodules and DVT/PE.  Patient was admitted 2/22 to 2/23 for acute pulmonary embolism started on eliquis. CTA PE study notable for multiple pulmonary nodules. He was re-admitted 3/3 for chest pain which was deemed non-cardiac.   Since discharge he complains of brain fog and vision changes. He is accompanied by his wife.  He is concerned about pancreatic issues due to a family history of pancreatic cancer in his mother and pancreatic problems in other relatives. A recent CT scan revealed a spot on his pancreas, and his amylase levels have been consistently elevated around 250, suggesting possible pancreatic involvement based on labs from his [place] primary provider.  He experiences significant brain fog and difficulty concentrating since starting Eliquis three weeks ago. While on heparin in the hospital, he felt alert and energetic, but the brain fog began upon transitioning to Eliquis. He feels lethargic and mentally fatigued, although his muscles seem unaffected. No bleeding in his urine or stool has been noted since starting Eliquis. Additionally, he reports vision changes, including double vision and pressure or pain behind the eyes, which he associates with starting Eliquis. He describes occasional blind spots and swelling around the eyes. He has a history of cataract surgery and typically sees an optometrist, not an ophthalmologist. No specific weakness in his arms or legs is associated with these vision changes.  He has a history of  sinus issues, including small sinus passages and seasonal allergies for which he takes Claritin and Flonase. His upper sinuses have been clogged since starting Eliquis, affecting his sleep. He recently completed a course of Bactrim and prednisone for sinus issues but is hesitant to take more antibiotics. No fevers, chills, or specific weakness in his arms or legs. He reports difficulty sleeping, with some nights spent in a recliner due to inability to sleep, not related to nerves or caffeine. His appetite is decent.  He describes a persistent gut ache that he likens to an ulcer, which worsens when lying down. He finds some relief with milk of magnesia and Pepcid, which he has been taking off and on since leaving the hospital. The discomfort began after his first pulmonary embolism.  Past Medical History:  Diagnosis Date   Arthritis    CAD (coronary artery disease) 02/26/2018   A. Inf STEMI 6/18: LHC - pLCx 60, dRCA 100, EF 45-50 >> PCI:  DES to RCA // B. Echo 8/18: EF 60-65, nwm, grade 1 dd, MAC. C. 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   Hyperlipemia    intol to some statins   IBS (irritable bowel syndrome)    Medication intolerance    severe SOB due to Brilinta >> changed to Plavix   Renal disorder    kidney stones   Seasonal allergies    Sinus congestion    chronic   Sleep apnea    uses a c-pap     Family History  Problem Relation Age of Onset   Skin cancer Mother    Pancreatic cancer Mother    Skin cancer Father      Social History   Socioeconomic History   Marital status: Married    Spouse name: Not on file   Number of children: Not on file   Years of education: Not on file   Highest education level: Not on file  Occupational History   Not on file  Tobacco Use   Smoking status: Never   Smokeless tobacco: Never  Vaping Use   Vaping status: Never Used  Substance and Sexual Activity   Alcohol use: Yes    Comment: a  bottle of wine every night   Drug use: No   Sexual activity: Not on file  Other Topics Concern   Not on file  Social History Narrative   Not on file   Social Drivers of Health   Financial Resource Strain: Not on file  Food Insecurity: No Food Insecurity (12/09/2023)   Hunger Vital Sign    Worried About Running Out of Food in the Last Year: Never true    Ran Out of Food in the Last Year: Never true  Transportation Needs: No Transportation Needs (12/09/2023)   PRAPARE - Administrator, Civil Service (Medical): No    Lack of Transportation (Non-Medical): No  Physical Activity: Not on file  Stress: Not on file  Social Connections: Moderately Integrated (12/09/2023)   Social Connection and Isolation Panel [NHANES]    Frequency of Communication with Friends and Family: More than three times a week    Frequency of Social Gatherings with Friends and Family: Once a week    Attends Religious Services: Never    Database administrator or Organizations: Yes    Attends Engineer, structural: More than 4 times per year    Marital Status: Married  Catering manager Violence: Not At Risk (12/09/2023)   Humiliation, Afraid, Rape, and Kick questionnaire    Fear of Current or Ex-Partner: No    Emotionally Abused: No    Physically Abused: No    Sexually Abused: No     Allergies  Allergen Reactions   Brilinta [Ticagrelor] Shortness Of Breath and Other (See Comments)    Dizziness, weakness/Caused hospital admission   Plavix [Clopidogrel] Shortness Of Breath and Other (See Comments)    Fatigue, also   Statins Other (See Comments)    Pain in joints, can't move knees   Clotrimazole-Betamethasone Other (See Comments)    Patient cannot recall symptoms   Potassium Citrate Diarrhea and Other (See Comments)    Severe stomach pain    Tagamet [Cimetidine] Other (See Comments)    Gynecomastia    Tizanidine Hcl Other (See Comments)    Pt cannot recall symptoms   Zyrtec [Cetirizine Hcl]  Other (See Comments)    Pt cannot recall symptoms   Dicyclomine Anxiety and Other (See Comments)    Interfered with sleep- made nervous     Outpatient Medications Prior to Visit  Medication Sig Dispense Refill   allopurinol (ZYLOPRIM) 300 MG tablet Take 300 mg by mouth daily.     amLODipine (NORVASC) 5 MG tablet TAKE 1 TABLET BY MOUTH DAILY 90 tablet 3   apixaban (ELIQUIS) 5 MG TABS tablet Take 2 tablets (10 mg total) by mouth 2 (two) times daily for 7 days, THEN 1 tablet (5 mg total) 2 (two) times daily. (Patient taking differently: Take 5 mg by mouth in the  morning and evening) 60 tablet 1   aspirin EC 81 MG tablet Take 81 mg by mouth daily.     fluticasone (FLONASE) 50 MCG/ACT nasal spray Place 2 sprays into both nostrils See admin instructions. Instill 2 sprays into both nostrils in the morning and evening     hydrocortisone 1 % ointment Apply 1 Application topically 2 (two) times daily as needed for itching.     ibuprofen (ADVIL,MOTRIN) 200 MG tablet Take 400 mg by mouth every 6 (six) hours as needed for mild pain (pain score 1-3) (lower back pain).     ketoconazole (NIZORAL) 2 % shampoo Apply 1 Application topically See admin instructions. Use as a shampoo once a week     Lactobacillus Rhamnosus, GG, (CULTURELLE) CAPS Take 1 capsule by mouth in the morning.     loratadine (CLARITIN) 10 MG tablet Take 10 mg by mouth See admin instructions. Take 10 mg by mouth in the morning and evening     Menthol-Methyl Salicylate (SALONPAS PAIN RELIEF PATCH) PTCH Apply 1 patch topically daily as needed (for pain).     Multiple Vitamin (MULTIVITAMIN WITH MINERALS) TABS Take 1 tablet by mouth daily with breakfast.     PRESCRIPTION MEDICATION CPAP- At bedtime     rosuvastatin (CRESTOR) 5 MG tablet Take 1 tablet (5 mg total) by mouth at bedtime.     sulfamethoxazole-trimethoprim (BACTRIM DS) 800-160 MG tablet Take 1 tablet by mouth 2 (two) times daily. 10 tablet 0   tacrolimus (PROTOPIC) 0.1 % ointment  Apply 1 Application topically daily as needed (for rashes- affected areas).     Wheat Dextrin (BENEFIBER) POWD Take 4 g by mouth See admin instructions. Mix 4 grams (1 teaspoonful) into a desired beverage and drink in the morning     No facility-administered medications prior to visit.    Review of Systems  Constitutional:  Positive for malaise/fatigue. Negative for chills, fever and weight loss.  HENT:  Negative for congestion, sinus pain and sore throat.   Eyes:  Positive for double vision.  Respiratory:  Negative for cough, hemoptysis, sputum production, shortness of breath and wheezing.   Cardiovascular:  Negative for chest pain, palpitations, orthopnea, claudication and leg swelling.  Gastrointestinal:  Positive for abdominal pain. Negative for heartburn, nausea and vomiting.  Genitourinary: Negative.   Musculoskeletal:  Negative for joint pain and myalgias.  Skin:  Negative for rash.  Neurological:  Negative for weakness.  Endo/Heme/Allergies: Negative.   Psychiatric/Behavioral: Negative.        Objective:   Vitals:   12/25/23 1618  BP: 132/71  Pulse: 87  SpO2: 99%  Weight: 221 lb 12.8 oz (100.6 kg)  Height: 5\' 7"  (1.702 m)   Physical Exam Constitutional:      General: He is not in acute distress.    Appearance: Normal appearance. He is obese.  Eyes:     General: No scleral icterus.    Conjunctiva/sclera: Conjunctivae normal.  Cardiovascular:     Rate and Rhythm: Normal rate and regular rhythm.  Pulmonary:     Breath sounds: No wheezing, rhonchi or rales.  Abdominal:     General: Bowel sounds are normal.     Palpations: Abdomen is soft.     Tenderness: There is no abdominal tenderness.  Musculoskeletal:     Right lower leg: No edema.     Left lower leg: No edema.  Skin:    General: Skin is warm and dry.  Neurological:     General: No focal deficit  present.    CBC    Component Value Date/Time   WBC 8.5 12/10/2023 0527   RBC 3.99 (L) 12/10/2023 0527    HGB 13.0 12/10/2023 0527   HCT 39.4 12/10/2023 0527   PLT 214 12/10/2023 0527   MCV 98.7 12/10/2023 0527   MCH 32.6 12/10/2023 0527   MCHC 33.0 12/10/2023 0527   RDW 13.2 12/10/2023 0527   LYMPHSABS 1.0 12/09/2023 1251   MONOABS 0.7 12/09/2023 1251   EOSABS 0.3 12/09/2023 1251   BASOSABS 0.1 12/09/2023 1251      Latest Ref Rng & Units 12/10/2023    5:27 AM 12/09/2023   12:51 PM 12/01/2023    4:16 AM  BMP  Glucose 70 - 99 mg/dL 829  562  130   BUN 8 - 23 mg/dL 15  16  16    Creatinine 0.61 - 1.24 mg/dL 8.65  7.84  6.96   Sodium 135 - 145 mmol/L 133  137  137   Potassium 3.5 - 5.1 mmol/L 3.9  4.0  4.2   Chloride 98 - 111 mmol/L 103  108  107   CO2 22 - 32 mmol/L 20  19  20    Calcium 8.9 - 10.3 mg/dL 9.0  9.6  8.8    Chest imaging: CTA Chest 12/09/23 1. No evidence acute pulmonary embolism. 2. Bilateral pulmonary nodules are concerning for metastatic disease. Pulmonary infection would be secondary consideration. 3. Fullness within the head of the pancreas is indeterminate. Relatively stable over comparison exams.  CTA Chest 11/29/23 Mediastinum/Nodes: No mediastinal, hilar, or axillary lymphadenopathy. Calcified lymph nodes are noted at the mediastinum and right hilum. The thyroid gland, trachea, and esophagus are within normal limits. There is a small hiatal hernia.   Lungs/Pleura: No consolidation, effusion or pneumothorax is seen. Scattered pulmonary nodules are present bilaterally measuring up to 1.4 cm in the right lower lobe.  PFT:    Latest Ref Rng & Units 03/18/2018    6:11 PM  PFT Results  FVC-Pre L 4.57   FVC-Predicted Pre % 121   FVC-Post L 4.51   FVC-Predicted Post % 120   Pre FEV1/FVC % % 83   Post FEV1/FCV % % 82   FEV1-Pre L 3.78   FEV1-Predicted Pre % 139   FEV1-Post L 3.71   DLCO uncorrected ml/min/mmHg 22.35   DLCO UNC% % 78   DLCO corrected ml/min/mmHg 21.87   DLCO COR %Predicted % 77   DLVA Predicted % 85   TLC L 6.58   TLC % Predicted % 102    RV % Predicted % 81     Labs:  Path:  Echo 11/30/23: LV EF 60-65%. RV size and systolic function normal  Heart Catheterization:       Assessment & Plan:   Other sinusitis, unspecified chronicity  Pulmonary nodules - Plan: NM PET Image Initial (PI) Skull Base To Thigh  Pancreatic lesion - Plan: NM PET Image Initial (PI) Skull Base To Thigh, Cancer Antigen 19-9, Amylase, Lipase  Epigastric pain  Gastroesophageal reflux disease without esophagitis - Plan: pantoprazole (PROTONIX) 40 MG tablet  Discussion: Timothy Wilcox "Timothy Wilcox" is a 83 year old male with history of CAD, OSA on CPAP who is referred to pulmonary clinic for pulmonary nodules and DVT/PE.  Pulmonary nodules Multiple nodules on CT suggest possible malignancy - Order PET scan to evaluate lung nodules and pancreas mass  Pancreatic lesion Pancreatic lesion noted on imaging. Reports elevated amylase levels based on PCP labs. Family history of  pancreatic cancer. PET scan to assess activity. - PET scan - Check pancreatic enzyme levels - Check CA 19-9 level  Sinus congestion and vision changes Sinus congestion and vision changes since starting Eliquis. Ophthalmologist evaluation needed. Possible sinusitis - Refer to ophthalmologist for eye examination to evaluate vision changes and eye pressure. - Hold off on further antibiotics or prednisone for sinus congestion per patient - consider head CT or MRI scan  Gastrointestinal discomfort GI discomfort possibly due to GERD vs gastric ulcer disease. CT abdomen negative. Pantoprazole discussed. - Prescribe pantoprazole once daily before breakfast. - Continue Pepcid as needed for additional relief. - Will refer to GI  DVT/PE - Continue Eliquis therapy.  Schedule follow up in 1 month  65 minutes spent on this visit.  Melody Comas, MD [facility] Office: [phone]    Current Outpatient Medications:    allopurinol (ZYLOPRIM) 300 MG  tablet, Take 300 mg by mouth daily., Disp: , Rfl:    amLODipine (NORVASC) 5 MG tablet, TAKE 1 TABLET BY MOUTH DAILY, Disp: 90 tablet, Rfl: 3   apixaban (ELIQUIS) 5 MG TABS tablet, Take 2 tablets (10 mg total) by mouth 2 (two) times daily for 7 days, THEN 1 tablet (5 mg total) 2 (two) times daily. (Patient taking differently: Take 5 mg by mouth in the morning and evening), Disp: 60 tablet, Rfl: 1   aspirin EC 81 MG tablet, Take 81 mg by mouth daily., Disp: , Rfl:    fluticasone (FLONASE) 50 MCG/ACT nasal spray, Place 2 sprays into both nostrils See admin instructions. Instill 2 sprays into both nostrils in the morning and evening, Disp: , Rfl:    hydrocortisone 1 % ointment, Apply 1 Application topically 2 (two) times daily as needed for itching., Disp: , Rfl:    ibuprofen (ADVIL,MOTRIN) 200 MG tablet, Take 400 mg by mouth every 6 (six) hours as needed for mild pain (pain score 1-3) (lower back pain)., Disp: , Rfl:    ketoconazole (NIZORAL) 2 % shampoo, Apply 1 Application topically See admin instructions. Use as a shampoo once a week, Disp: , Rfl:    Lactobacillus Rhamnosus, GG, (CULTURELLE) CAPS, Take 1 capsule by mouth in the morning., Disp: , Rfl:    loratadine (CLARITIN) 10 MG tablet, Take 10 mg by mouth See admin instructions. Take 10 mg by mouth in the morning and evening, Disp: , Rfl:    Menthol-Methyl Salicylate (SALONPAS PAIN RELIEF PATCH) PTCH, Apply 1 patch topically daily as needed (for pain)., Disp: , Rfl:    Multiple Vitamin (MULTIVITAMIN WITH MINERALS) TABS, Take 1 tablet by mouth daily with breakfast., Disp: , Rfl:    pantoprazole (PROTONIX) 40 MG tablet, Take 1 tablet (40 mg total) by mouth daily., Disp: 30 tablet, Rfl: 5   PRESCRIPTION MEDICATION, CPAP- At bedtime, Disp: , Rfl:    rosuvastatin (CRESTOR) 5 MG tablet, Take 1 tablet (5 mg total) by mouth at bedtime., Disp: , Rfl:    sulfamethoxazole-trimethoprim (BACTRIM DS) 800-160 MG tablet, Take 1 tablet by mouth 2 (two) times  daily., Disp: 10 tablet, Rfl: 0   tacrolimus (PROTOPIC) 0.1 % ointment, Apply 1 Application topically daily as needed (for rashes- affected areas)., Disp: , Rfl:    Wheat Dextrin (BENEFIBER) POWD, Take 4 g by mouth See admin instructions. Mix 4 grams (1 teaspoonful) into a desired beverage and drink in the morning, Disp: , Rfl:

## 2023-12-26 ENCOUNTER — Telehealth: Payer: Self-pay

## 2023-12-26 ENCOUNTER — Other Ambulatory Visit (INDEPENDENT_AMBULATORY_CARE_PROVIDER_SITE_OTHER)

## 2023-12-26 DIAGNOSIS — H532 Diplopia: Secondary | ICD-10-CM | POA: Diagnosis not present

## 2023-12-26 DIAGNOSIS — K869 Disease of pancreas, unspecified: Secondary | ICD-10-CM | POA: Diagnosis not present

## 2023-12-26 LAB — AMYLASE: Amylase: 115 U/L (ref 27–131)

## 2023-12-26 LAB — LIPASE: Lipase: 158 U/L — ABNORMAL HIGH (ref 11.0–59.0)

## 2023-12-26 NOTE — Telephone Encounter (Signed)
 Patient dropped off labwork. Put in Dr. Francine Graven box.

## 2023-12-26 NOTE — Telephone Encounter (Signed)
 spoke w/ PT and notified that our lab will not be open gave him address to [facility]

## 2023-12-27 ENCOUNTER — Ambulatory Visit (HOSPITAL_COMMUNITY)
Admission: RE | Admit: 2023-12-27 | Discharge: 2023-12-27 | Disposition: A | Discharge status: Home / Self Care | Source: Ambulatory Visit | Attending: Pulmonary Disease | Admitting: Pulmonary Disease

## 2023-12-27 DIAGNOSIS — R918 Other nonspecific abnormal finding of lung field: Secondary | ICD-10-CM | POA: Insufficient documentation

## 2023-12-27 DIAGNOSIS — K869 Disease of pancreas, unspecified: Secondary | ICD-10-CM | POA: Diagnosis not present

## 2023-12-27 DIAGNOSIS — C250 Malignant neoplasm of head of pancreas: Secondary | ICD-10-CM | POA: Diagnosis not present

## 2023-12-27 DIAGNOSIS — C7801 Secondary malignant neoplasm of right lung: Secondary | ICD-10-CM | POA: Diagnosis not present

## 2023-12-27 LAB — GLUCOSE, CAPILLARY: Glucose-Capillary: 122 mg/dL — ABNORMAL HIGH (ref 70–99)

## 2023-12-27 LAB — CANCER ANTIGEN 19-9: CA 19-9: 66286 U/mL — ABNORMAL HIGH (ref <34)

## 2023-12-27 MED ORDER — FLUDEOXYGLUCOSE F - 18 (FDG) INJECTION
11.0500 | Freq: Once | INTRAVENOUS | Status: AC
  Administered 2023-12-27: 11.61 via INTRAVENOUS

## 2023-12-28 ENCOUNTER — Encounter: Payer: Self-pay | Admitting: Pulmonary Disease

## 2023-12-29 ENCOUNTER — Telehealth: Payer: Self-pay | Admitting: Pulmonary Disease

## 2023-12-29 ENCOUNTER — Encounter: Payer: Self-pay | Admitting: Pulmonary Disease

## 2023-12-29 DIAGNOSIS — K869 Disease of pancreas, unspecified: Secondary | ICD-10-CM

## 2023-12-29 DIAGNOSIS — R918 Other nonspecific abnormal finding of lung field: Secondary | ICD-10-CM

## 2023-12-29 DIAGNOSIS — C770 Secondary and unspecified malignant neoplasm of lymph nodes of head, face and neck: Secondary | ICD-10-CM

## 2023-12-29 NOTE — Telephone Encounter (Signed)
 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 I have placed a referral to IR for evaluation of the left supraclavicular lymph node with biopsy.   Brain MRI ordered for further staging evaluation. Referral placed to Oncology.  Melody Comas, MD [facility] Office: [phone]   See Amion for personal pager PCCM on call pager [phone] until 7pm. Please call Elink 7p-7a. [phone]

## 2023-12-30 ENCOUNTER — Encounter: Payer: Self-pay | Admitting: *Deleted

## 2023-12-30 ENCOUNTER — Telehealth: Payer: Self-pay | Admitting: Pulmonary Disease

## 2023-12-30 ENCOUNTER — Other Ambulatory Visit (HOSPITAL_COMMUNITY): Payer: Self-pay | Admitting: Pulmonary Disease

## 2023-12-30 ENCOUNTER — Encounter (HOSPITAL_COMMUNITY): Payer: Self-pay | Admitting: Pulmonary Disease

## 2023-12-30 ENCOUNTER — Ambulatory Visit (HOSPITAL_COMMUNITY)
Admission: RE | Admit: 2023-12-30 | Discharge: 2023-12-30 | Disposition: A | Discharge status: Home / Self Care | Source: Ambulatory Visit | Attending: Pulmonary Disease | Admitting: Pulmonary Disease

## 2023-12-30 DIAGNOSIS — C770 Secondary and unspecified malignant neoplasm of lymph nodes of head, face and neck: Secondary | ICD-10-CM | POA: Diagnosis not present

## 2023-12-30 DIAGNOSIS — R930 Abnormal findings on diagnostic imaging of skull and head, not elsewhere classified: Secondary | ICD-10-CM | POA: Diagnosis not present

## 2023-12-30 MED ORDER — GADOBUTROL 1 MMOL/ML IV SOLN
10.0000 mL | Freq: Once | INTRAVENOUS | Status: AC | PRN
  Administered 2023-12-30: 10 mL via INTRAVENOUS

## 2023-12-30 NOTE — Progress Notes (Unsigned)
 Mugweru, Cletis Athens, MD  Shirlean Mylar L PROCEDURE / BIOPSY REVIEW Date: 12/30/23  Requested Biopsy site: L supraclavicular neck Reason for request: HM LN Imaging review: Best seen on PET CT  Decision: Approved Imaging modality to perform: Ultrasound Schedule with: No sedation / Local anesthetic Schedule for: Any VIR  Additional comments: @Schedulers . [place] L supraclavicular LN Bx  Please contact me with questions, concerns, or if issue pertaining to this request arise.  Roanna Banning, MD [facility]       Previous Messages    ----- Message ----- From: Alain Marion Sent: 12/30/2023  12:43 PM EDT To: Alain Marion; Ir Procedure Requests Subject: [place] CORE BIOPSY (LYMPH NODES)                  Procedure :[place] CORE BIOPSY (LYMPH NODES)  Reason : Biopsy of supraclavicular lymph node noted on recent pet scan. 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

## 2023-12-30 NOTE — [facility] Notes (Signed)
 PATIENT NAVIGATOR PROGRESS NOTE  Name: Timothy Wilcox Date: 12/30/2023 MRN: [id number]  DOB: 09-Sep-1941   Reason for visit:  New patient appt  Comments:  Spoke with [name] and have him scheduled with Dr Truett Perna on 12/31/23 at 1:40 pm    Time spent counseling/coordinating care: 30-45 minutes

## 2023-12-30 NOTE — Telephone Encounter (Signed)
 I called the patient to inform them about their MRI appointment. They would like a call to get more information about what an MRI scan entails and whether it will specifically focus on the sinus area, as they have some concerns.

## 2023-12-30 NOTE — Telephone Encounter (Signed)
 MRI Brain results called in by radiology.  Deferred discussion of these findings to in person oncology visit pending in less than 24 hours.  IMPRESSION: 1. 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 Given involvement of multiple vascular territories, consider embolic etiology. 2. Curvilinear enhancement in the left parietal lobe most likely represents evolving, enhancing subacute infarct; however, given the clinical history short interval follow-up MRI with contrast in approximately 6 weeks is recommended to ensure expected evolution and exclude metastatic disease.  Echocardiogram results reviewed-the mitral valve is abnormal, but no evidence of vegetations.  I suspect these findings are more consistent with developing metastases.  Scheduled with Dr Truett Perna on 12/31/23 at 1:40 pm for new oncology visit.  At this point, no immediate intervention is required, needs follow-up with oncology to initiate planning.

## 2023-12-30 NOTE — Telephone Encounter (Signed)
Tried calling the pt and there was no answer and no option to leave msg. Will call back.

## 2023-12-31 ENCOUNTER — Encounter: Payer: Self-pay | Admitting: *Deleted

## 2023-12-31 ENCOUNTER — Other Ambulatory Visit (HOSPITAL_BASED_OUTPATIENT_CLINIC_OR_DEPARTMENT_OTHER): Payer: Self-pay

## 2023-12-31 ENCOUNTER — Inpatient Hospital Stay: Attending: Oncology | Admitting: Oncology

## 2023-12-31 ENCOUNTER — Other Ambulatory Visit: Payer: Self-pay | Admitting: *Deleted

## 2023-12-31 ENCOUNTER — Inpatient Hospital Stay

## 2023-12-31 VITALS — BP 127/76 | HR 91 | Temp 98.2°F | Resp 18 | Ht 67.0 in | Wt 228.8 lb

## 2023-12-31 DIAGNOSIS — Z85828 Personal history of other malignant neoplasm of skin: Secondary | ICD-10-CM

## 2023-12-31 DIAGNOSIS — I2510 Atherosclerotic heart disease of native coronary artery without angina pectoris: Secondary | ICD-10-CM

## 2023-12-31 DIAGNOSIS — I82442 Acute embolism and thrombosis of left tibial vein: Secondary | ICD-10-CM

## 2023-12-31 DIAGNOSIS — I82452 Acute embolism and thrombosis of left peroneal vein: Secondary | ICD-10-CM | POA: Diagnosis not present

## 2023-12-31 DIAGNOSIS — Z800 Family history of malignant neoplasm of digestive organs: Secondary | ICD-10-CM | POA: Diagnosis not present

## 2023-12-31 DIAGNOSIS — C250 Malignant neoplasm of head of pancreas: Secondary | ICD-10-CM | POA: Insufficient documentation

## 2023-12-31 DIAGNOSIS — I2699 Other pulmonary embolism without acute cor pulmonale: Secondary | ICD-10-CM

## 2023-12-31 DIAGNOSIS — K869 Disease of pancreas, unspecified: Secondary | ICD-10-CM

## 2023-12-31 DIAGNOSIS — I639 Cerebral infarction, unspecified: Secondary | ICD-10-CM | POA: Diagnosis not present

## 2023-12-31 DIAGNOSIS — I82451 Acute embolism and thrombosis of right peroneal vein: Secondary | ICD-10-CM

## 2023-12-31 DIAGNOSIS — R918 Other nonspecific abnormal finding of lung field: Secondary | ICD-10-CM

## 2023-12-31 DIAGNOSIS — R599 Enlarged lymph nodes, unspecified: Secondary | ICD-10-CM

## 2023-12-31 DIAGNOSIS — K8689 Other specified diseases of pancreas: Secondary | ICD-10-CM

## 2023-12-31 MED ORDER — ENOXAPARIN SODIUM 150 MG/ML IJ SOSY
150.0000 mg | PREFILLED_SYRINGE | INTRAMUSCULAR | 1 refills | Status: DC
  Filled 2023-12-31: qty 30, 30d supply, fill #0

## 2023-12-31 MED ORDER — ENOXAPARIN SODIUM 150 MG/ML IJ SOSY
PREFILLED_SYRINGE | INTRAMUSCULAR | 0 refills | Status: DC
  Filled 2023-12-31: qty 30, 30d supply, fill #0

## 2023-12-31 MED ORDER — HYDROCODONE-ACETAMINOPHEN 5-325 MG PO TABS
1.0000 | ORAL_TABLET | Freq: Four times a day (QID) | ORAL | 0 refills | Status: AC | PRN
Start: 2023-12-31 — End: ?
  Filled 2023-12-31: qty 30, 8d supply, fill #0

## 2023-12-31 NOTE — [facility] Notes (Signed)
 Cost for Lovenox at [facility] w/insurance #30 enoxaparin 150 mg = $212+ abd he agrees to pick up. Provided him form to complete for patient assistance program with [employer].

## 2023-12-31 NOTE — [facility] Notes (Unsigned)
 [name], MD  [name], [name] Yes, that is ok to hold. I will likely cover him with lovenox in the mean time. When will he be scheduled?  Timothy Wilcox

## 2023-12-31 NOTE — Progress Notes (Signed)
 [facility] New Patient Consult   Requesting MD: Soundra Pilon, Fnp [address] [place],  [place] [address]   Timothy Wilcox 83 y.o.  Aug 02, 1941    Reason for Consult: Pancreas mass,   HPI: Timothy Wilcox reports developing leg pain following a trip to [facility] in August 2024.  The pain spontaneously resolved.  He developed left-sided chest pain 11/29/2023 and presented to the emergency room.  A CT chest revealed small segmental and subsegmental pulmonary emboli in the left upper lobe with concern for right heart strain.  Multiple pulmonary nodules were noted.  Dopplers confirmed acute DVT at the right peroneal, and left posterior tibial/peroneal veins He was admitted and treated with heparin.  He was transition to apixaban and discharged to home on 12/01/2023.  He continues apixaban.  He return to the emergency room 12/09/2023 with chest pain and dyspnea.  A CT chest revealed no pulmonary embolism, but he was noted to have pulmonary nodules and fullness in the head of the pancreas.  He was referred to Dr. Francine Graven on 12/25/2023.  He reported "brain fog "and vision changes.  He reports the symptoms improved while he was on heparin in the hospital.  He also reported diplopia and abdominal discomfort.  He was referred for a PET scan and brain MRI.  The brain MRI revealed many small foci of restricted diffusion compatible with embolic strokes.  The PET reveals a hypermetabolic pancreas head/uncinate mass and hypermetabolic pulmonary nodules.  There are also hypermetabolic left supraclavicular and retroperitoneal lymph nodes.  He continues apixaban anticoagulation.  He feels the visual disturbance and difficulty concentrating have progressed while on apixaban.      Past Medical History:  Diagnosis Date   Arthritis    CAD (coronary artery disease) 02/26/2018   A. Inf STEMI 6/18: LHC - pLCx 60, dRCA 100, EF 45-50 >> PCI:  DES to RCA // B. Echo  8/18: EF 60-65, nwm, grade 1 dd, MAC. C. 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  Basal cell carcinomas of the face  Past Surgical History:  Procedure Laterality Date   COLONOSCOPY     CORONARY STENT INTERVENTION N/A 04/03/2017   Procedure: Coronary Stent Intervention;  Surgeon: Tonny Bollman, MD;  Location: [facility] INVASIVE CV LAB;  Service: Cardiovascular;  Laterality: N/A;   CORONARY STENT INTERVENTION N/A 02/27/2018   Procedure: CORONARY STENT INTERVENTION;  Surgeon: Lennette Bihari, MD;  Location: [facility] INVASIVE CV LAB;  Service: Cardiovascular;  Laterality: N/A;   INCISION AND DRAINAGE  2011   infected finger   LEFT HEART CATH AND CORONARY ANGIOGRAPHY N/A 04/03/2017   Procedure: Left Heart Cath and Coronary Angiography;  Surgeon: Tonny Bollman, MD;  Location: [facility] INVASIVE CV LAB;  Service: Cardiovascular;  Laterality: N/A;   LEFT HEART CATH AND CORONARY ANGIOGRAPHY N/A 02/27/2018   Procedure: LEFT HEART CATH AND CORONARY ANGIOGRAPHY;  Surgeon: Lennette Bihari, MD;  Location: [facility] INVASIVE CV LAB;  Service: Cardiovascular;  Laterality: N/A;   SHOULDER  ARTHROSCOPY WITH ROTATOR CUFF REPAIR AND SUBACROMIAL DECOMPRESSION Right 01/29/2013   Procedure: RIGHT SHOULDER ARTHROSCOPY WITH SUBACROMIAL DECOMPRESSION, THREE TENDON ROTATOR CUFF REPAIR;  Surgeon: Wyn Forster., MD;  Location: [facility];  Service: Orthopedics;  Laterality: Right;   TOE DEBRIDEMENT     rt toe cyst    Medications: Reviewed  Allergies:  Allergies  Allergen Reactions   Brilinta [Ticagrelor] Shortness Of Breath and Other (See Comments)    Dizziness, weakness/Caused hospital admission    Plavix [Clopidogrel] Shortness Of Breath and Other (See Comments)    Fatigue, also   Statins Other (See Comments)    Pain in joints, can't move knees   Clotrimazole-Betamethasone Other (See Comments)    Patient cannot recall symptoms   Potassium Citrate Diarrhea and Other (See Comments)    Severe stomach pain    Tagamet [Cimetidine] Other (See Comments)    Gynecomastia    Tizanidine Hcl Other (See Comments)    Pt cannot recall symptoms   Zyrtec [Cetirizine Hcl] Other (See Comments)    Pt cannot recall symptoms   Dicyclomine Anxiety and Other (See Comments)    Interfered with sleep- made nervous    Family history: Mother died of pancreas cancer at age 50  Social History:   He lives with his wife in [place].  He is retired from an office occupation.  He does not use cigarettes.  He drinks 1 bottle of wine per night.  No transfusion history.  No risk factor for HIV or hepatitis.  ROS:   Positives include: Hematuria while on heparin in the hospital, diplopia for several weeks, mid abdominal pain, difficulty sleeping secondary to pain, occasional discomfort at the right temporal region, decreased concentration ability, "fuzzy "vision and decreased peripheral vision  A complete ROS was otherwise negative.  Physical Exam:  Blood pressure 127/76, pulse 91, temperature 98.2 F (36.8 C), temperature source Temporal, resp. rate 18, height 5\' 7"  (1.702 m), weight 228 lb 12.8 oz (103.8 kg), SpO2 98%.  HEENT: Oropharynx without visible mass, neck without mass Lungs: Clear bilaterally Cardiac: Regular rate and rhythm Abdomen: No hepatosplenomegaly, mild tenderness in the mid upper abdomen, no mass  Vascular: No leg erythema, tenderness, or edema Lymph nodes: No cervical, supraclavicular, axillary, or inguinal nodes Neurologic: Alert and oriented, the motor exam appears intact in the upper and lower extremities bilaterally, extraocular movements are intact, he reports diplopia Skin:  No rash Musculoskeletal: No spine tenderness   LAB:  CBC  Lab Results  Component Value Date   WBC 8.5 12/10/2023   HGB 13.0 12/10/2023   HCT 39.4 12/10/2023   MCV 98.7 12/10/2023   PLT 214 12/10/2023   NEUTROABS 5.5 12/09/2023        CMP  Lab Results  Component Value Date   NA 133 (L) 12/10/2023   K 3.9 12/10/2023   CL 103 12/10/2023   CO2 20 (L) 12/10/2023   GLUCOSE 117 (H) 12/10/2023   BUN 15 12/10/2023   CREATININE 0.90 12/10/2023   CALCIUM 9.0 12/10/2023   PROT 7.2 12/09/2023   ALBUMIN 3.7 12/09/2023   AST 26 12/09/2023   ALT 29 12/09/2023   ALKPHOS 69 12/09/2023   BILITOT 0.6 12/09/2023   GFRNONAA >60 12/10/2023   GFRAA >60 11/08/2018     No results found for: "CEA1", "VWU981", "CA125"  Imaging:  MR BRAIN W WO CONTRAST Result Date: 12/30/2023 CLINICAL DATA:  Metastatic disease evaluation EXAM: MRI HEAD WITHOUT AND WITH CONTRAST TECHNIQUE: Multiplanar, multiecho pulse  sequences of the brain and surrounding structures were obtained without and with intravenous contrast. CONTRAST:  10mL GADAVIST GADOBUTROL 1 MMOL/ML IV SOLN COMPARISON:  None Available. FINDINGS: Brain: Many small foci of restricted diffusion in the cerebellum, left frontal and parietal lobes, and left basal ganglia are compatible with acute infarcts. Curvilinear enhancement in the left parietal lobe. No midline shift. No acute hemorrhage. No hydrocephalus. Small remote cerebellar infarcts. Vascular: Major arterial flow voids are maintained. Skull and upper cervical spine: Normal marrow signal. Sinuses/Orbits: Clear sinuses.  No acute orbital findings. IMPRESSION: 1. 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 Given involvement of multiple vascular territories, consider embolic etiology. 2. Curvilinear enhancement in the left parietal lobe most likely represents evolving,  enhancing subacute infarct; however, given the clinical history short interval follow-up MRI with contrast in approximately 6 weeks is recommended to ensure expected evolution and exclude metastatic disease. These results will be called to the ordering clinician or representative by the Radiologist Assistant, and communication documented in the PACS or [employer]. Electronically Signed   By: Feliberto Harts M.D.   On: 12/30/2023 21:26      Assessment/Plan:   Pancreas head/uncinate mass 11/19/2022: 2.9 cm thin-walled cystic lesion in the pancreas uncinate 07/28/2023: CT abdomen/pelvis: Stable 2.8 cm cystic area in the uncinate process 11/29/2023: CT chest-subsegmental and segmental pulmonary emboli in the left upper lobe, dilation of the right ventricle, multiple pulmonary nodules 12/27/2023: PET-5 cm hypermetabolic pancreas head/uncinate mass, numerous hypermetabolic pulmonary nodules, hypermetabolic left supraclavicular and retroperitoneal lymph nodes Pulmonary embolism 11/29/2023 Small segmental and subsegmental pulmonary emboli in the left upper lobe, dilation of right ventricle Admitted for heparin anticoagulation followed by apixaban-discharged 12/01/2023 Lower extremity Dopplers 11/30/2023: Acute left posterior tibial, peroneal, and TP trunk DVTs, acute right peroneal DVT  3.  CVAs-visual disturbance and altered mental status 12/30/2023 MRI brain-many foci of restricted diffusion in the cerebellum, left frontal, left basal ganglia, and parietal lobes compatible with acute/subacute infarcts, consider embolic etiology 4.  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  Kidney stones 6.  Gout 7.  Family history of pancreas cancer 8.  History of basal cell carcinoma   Disposition:   Timothy Wilcox presents with a pancreas mass, lung nodules, and hypermetabolic supraclavicular/retroperitoneal lymph nodes.  He has a history of DVT/PE and multivessel distribution strokes.  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  The CA 19-9 is markedly elevated.  I discussed the likely diagnosis with Timothy Wilcox and his wife.  He understands he appears to have an incurable cancer with a poor prognosis for long-term survival.  He will be referred for a diagnostic biopsy of a left supraclavicular lymph node. We had a preliminary discussion regarding treatment options.  Treatment will consist of systemic therapy, likely chemotherapy.  The biopsy tissue will be submitted for molecular testing.  He will be referred for genetics testing given the family history of pancreas cancer.  Timothy Wilcox will begin a trial of hydrocodone for pain.  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  He feels neurologic symptoms have progressed while on apixaban.  He will begin Lovenox anticoagulation.  He was instructed on self administration of Lovenox.  Timothy Wilcox is scheduled for a lymph node biopsy 01/06/2024.  He will return for an office visit and further  discussion on 01/08/2024.  Thornton Papas, MD  12/31/2023, 4:08 PM

## 2023-12-31 NOTE — Progress Notes (Signed)
 PATIENT NAVIGATOR PROGRESS NOTE  Name: Timothy Wilcox Date: 12/31/2023 MRN: [id number]  DOB: 1941/02/07   Reason for visit:  New Patient appt  Comments:  Met with [name] during visit with Dr Truett Perna  He will have lymph node bx for diagnosis on 01/06/24 at [facility], instructions printed and given Given information on self injection of Lovenox and reviewed with patient and wife. Genetics Kit ordered for next visit Given contact number to call with any questions    Time spent counseling/coordinating care: > 60 minutes

## 2024-01-01 ENCOUNTER — Other Ambulatory Visit: Payer: Self-pay | Admitting: Ophthalmology

## 2024-01-01 ENCOUNTER — Other Ambulatory Visit (HOSPITAL_BASED_OUTPATIENT_CLINIC_OR_DEPARTMENT_OTHER): Payer: Self-pay

## 2024-01-01 DIAGNOSIS — H532 Diplopia: Secondary | ICD-10-CM

## 2024-01-06 ENCOUNTER — Ambulatory Visit
Admission: RE | Admit: 2024-01-06 | Discharge: 2024-01-06 | Disposition: A | Discharge status: Home / Self Care | Source: Ambulatory Visit | Attending: Pulmonary Disease | Admitting: Pulmonary Disease

## 2024-01-06 DIAGNOSIS — K8689 Other specified diseases of pancreas: Secondary | ICD-10-CM | POA: Insufficient documentation

## 2024-01-06 DIAGNOSIS — C770 Secondary and unspecified malignant neoplasm of lymph nodes of head, face and neck: Secondary | ICD-10-CM | POA: Insufficient documentation

## 2024-01-06 DIAGNOSIS — R599 Enlarged lymph nodes, unspecified: Secondary | ICD-10-CM | POA: Diagnosis not present

## 2024-01-06 DIAGNOSIS — R918 Other nonspecific abnormal finding of lung field: Secondary | ICD-10-CM | POA: Insufficient documentation

## 2024-01-06 DIAGNOSIS — R591 Generalized enlarged lymph nodes: Secondary | ICD-10-CM | POA: Insufficient documentation

## 2024-01-06 DIAGNOSIS — C801 Malignant (primary) neoplasm, unspecified: Secondary | ICD-10-CM | POA: Diagnosis not present

## 2024-01-06 MED ORDER — LIDOCAINE HCL (PF) 1 % IJ SOLN
10.0000 mL | Freq: Once | INTRAMUSCULAR | Status: AC
  Administered 2024-01-06: 10 mL
  Filled 2024-01-06: qty 10

## 2024-01-06 NOTE — Discharge Instructions (Signed)
 Reviewed w patient

## 2024-01-06 NOTE — Procedures (Signed)
 Interventional Radiology Procedure Note  Procedure: US Guided Biopsy of left supraclavicular  Complications: None  Estimated Blood Loss: < 10 mL  Findings: 18 G core biopsy of left supraclavicular lymph node performed under US guidance.  Four core samples obtained and sent to Pathology.  Jodi Marble. Fredia Sorrow, M.D Pager:  [phone]

## 2024-01-07 LAB — SURGICAL PATHOLOGY

## 2024-01-08 ENCOUNTER — Other Ambulatory Visit: Payer: Self-pay | Admitting: *Deleted

## 2024-01-08 ENCOUNTER — Inpatient Hospital Stay

## 2024-01-08 ENCOUNTER — Encounter: Payer: Self-pay | Admitting: *Deleted

## 2024-01-08 ENCOUNTER — Inpatient Hospital Stay: Attending: Oncology | Admitting: Oncology

## 2024-01-08 VITALS — BP 127/76 | HR 88 | Temp 98.2°F | Resp 18 | Ht 67.0 in | Wt 230.7 lb

## 2024-01-08 DIAGNOSIS — Z800 Family history of malignant neoplasm of digestive organs: Secondary | ICD-10-CM | POA: Diagnosis not present

## 2024-01-08 DIAGNOSIS — C250 Malignant neoplasm of head of pancreas: Secondary | ICD-10-CM | POA: Insufficient documentation

## 2024-01-08 DIAGNOSIS — Z5111 Encounter for antineoplastic chemotherapy: Secondary | ICD-10-CM | POA: Diagnosis present

## 2024-01-08 DIAGNOSIS — C778 Secondary and unspecified malignant neoplasm of lymph nodes of multiple regions: Secondary | ICD-10-CM | POA: Diagnosis present

## 2024-01-08 LAB — CMP (CANCER CENTER ONLY)
ALT: 48 U/L — ABNORMAL HIGH (ref 0–44)
AST: 30 U/L (ref 15–41)
Albumin: 4.1 g/dL (ref 3.5–5.0)
Alkaline Phosphatase: 61 U/L (ref 38–126)
Anion gap: 8 (ref 5–15)
BUN: 15 mg/dL (ref 8–23)
CO2: 26 mmol/L (ref 22–32)
Calcium: 9.8 mg/dL (ref 8.9–10.3)
Chloride: 104 mmol/L (ref 98–111)
Creatinine: 0.98 mg/dL (ref 0.61–1.24)
GFR, Estimated: >60 mL/min (ref >60)
Glucose, Bld: 128 mg/dL — ABNORMAL HIGH (ref 70–99)
Potassium: 4.5 mmol/L (ref 3.5–5.1)
Sodium: 138 mmol/L (ref 135–145)
Total Bilirubin: 0.4 mg/dL (ref 0.0–1.2)
Total Protein: 7 g/dL (ref 6.5–8.1)

## 2024-01-08 LAB — CBC WITH DIFFERENTIAL (CANCER CENTER ONLY)
Abs Immature Granulocytes: 0.04 10*3/uL (ref 0.00–0.07)
Basophils Absolute: 0.1 10*3/uL (ref 0.0–0.1)
Basophils Relative: 1 %
Eosinophils Absolute: 0.4 10*3/uL (ref 0.0–0.5)
Eosinophils Relative: 5 %
HCT: 40.5 % (ref 39.0–52.0)
Hemoglobin: 13.5 g/dL (ref 13.0–17.0)
Immature Granulocytes: 1 %
Lymphocytes Relative: 16 %
Lymphs Abs: 1.1 10*3/uL (ref 0.7–4.0)
MCH: 31.7 pg (ref 26.0–34.0)
MCHC: 33.3 g/dL (ref 30.0–36.0)
MCV: 95.1 fL (ref 80.0–100.0)
Monocytes Absolute: 0.8 10*3/uL (ref 0.1–1.0)
Monocytes Relative: 11 %
Neutro Abs: 4.8 10*3/uL (ref 1.7–7.7)
Neutrophils Relative %: 66 %
Platelet Count: 253 10*3/uL (ref 150–400)
RBC: 4.26 MIL/uL (ref 4.22–5.81)
RDW: 13 % (ref 11.5–15.5)
WBC Count: 7.1 10*3/uL (ref 4.0–10.5)
nRBC: 0 % (ref 0.0–0.2)

## 2024-01-08 MED ORDER — PROCHLORPERAZINE MALEATE 10 MG PO TABS: 10.0000 mg | ORAL_TABLET | Freq: Four times a day (QID) | ORAL | 1 refills | Status: DC | PRN

## 2024-01-08 MED ORDER — ONDANSETRON HCL 8 MG PO TABS: 8.0000 mg | ORAL_TABLET | Freq: Three times a day (TID) | ORAL | 1 refills | Status: DC | PRN

## 2024-01-08 MED ORDER — LORAZEPAM 0.5 MG PO TABS: 0.5000 mg | ORAL_TABLET | Freq: Every evening | ORAL | 1 refills | Status: DC | PRN

## 2024-01-08 NOTE — Progress Notes (Signed)
 PATIENT NAVIGATOR PROGRESS NOTE  Name: Timothy Wilcox Date: 01/08/2024 MRN: [id number]  DOB: [birth year]   Reason for visit:  F/U appt  Comments:  Met with Mr and Mrs Methot during appt with Dr Truett Perna to discuss tx plan  Port a cath scheduled for placement 01/15/24 at [facility] Given written information on Gemzar/Abraxane Genetics kit to be drawn today Foundation one testing and MMR testing requested on 01/07/24, awaiting results Given contact information to call with any questions or issues    Time spent counseling/coordinating care: > 60 minutes

## 2024-01-08 NOTE — Progress Notes (Signed)
 START ON PATHWAY REGIMEN - Pancreatic Adenocarcinoma     A cycle is every 28 days:     Nab-paclitaxel (protein bound)      Gemcitabine   **Always confirm dose/schedule in your pharmacy ordering system**  Patient Characteristics: Metastatic Disease, First Line, PS = 0,1, BRCA1/2 and PALB2  Mutation Absent/Unknown Therapeutic Status: Metastatic Disease Line of Therapy: First Line ECOG Performance Status: 1 BRCA1/2 Mutation Status: Awaiting Test Results PALB2 Mutation Status: Awaiting Test Results Intent of Therapy: Non-Curative / Palliative Intent, Discussed with Patient

## 2024-01-08 NOTE — Progress Notes (Signed)
 [facility] OFFICE PROGRESS NOTE   Diagnosis: Pancreas cancer  INTERVAL HISTORY:   Timothy Wilcox returns as scheduled.  He is taking once daily Lovenox.  He reports persistent exertional dyspnea.  Visual symptoms have improved.  He has insomnia.  Hydrocodone partially helped abdominal pain and insomnia.  He would like a sleep medication.  He has ecchymoses at the abdominal wall injection sites.  He has pain at the abdomen and lower back.  Objective:  Vital signs in last 24 hours:  Blood pressure 127/76, pulse 88, temperature 98.2 F (36.8 C), temperature source Temporal, resp. rate 18, height 5\' 7"  (1.702 m), weight 230 lb 11.2 oz (104.6 kg), SpO2 96%.    Lymphatics: No cervical or supraclavicular nodes Resp: Lungs clear bilaterally Cardio: Regular rate and rhythm GI: No hepatosplenomegaly, no mass, nontender Vascular: No leg edema  Skin: Mostly is at injection sites over the left abdominal wall, ecchymosis at the left supraclavicular biopsy site  Lab Results:  Lab Results  Component Value Date   WBC 8.5 12/10/2023   HGB 13.0 12/10/2023   HCT 39.4 12/10/2023   MCV 98.7 12/10/2023   PLT 214 12/10/2023   NEUTROABS 5.5 12/09/2023    CMP  Lab Results  Component Value Date   NA 133 (L) 12/10/2023   K 3.9 12/10/2023   CL 103 12/10/2023   CO2 20 (L) 12/10/2023   GLUCOSE 117 (H) 12/10/2023   BUN 15 12/10/2023   CREATININE 0.90 12/10/2023   CALCIUM 9.0 12/10/2023   PROT 7.2 12/09/2023   ALBUMIN 3.7 12/09/2023   AST 26 12/09/2023   ALT 29 12/09/2023   ALKPHOS 69 12/09/2023   BILITOT 0.6 12/09/2023   GFRNONAA >60 12/10/2023   GFRAA >60 11/08/2018      Imaging:  [place] CORE BIOPSY (LYMPH NODES) Result Date: 01/06/2024 INDICATION: Pancreatic mass, lung nodules and lymphadenopathy. Enlarged and hypermetabolic left supraclavicular lymph nodes are targeted for biopsy. EXAM: ULTRASOUND GUIDED CORE BIOPSY OF LEFT SUPRACLAVICULAR LYMPH NODE MEDICATIONS: None.  ANESTHESIA/SEDATION: None PROCEDURE: The procedure, risks, benefits, and alternatives were explained to the patient. Questions regarding the procedure were encouraged and answered. The patient understands and consents to the procedure. A time-out was performed prior to initiating the procedure. The left neck was prepped with chlorhexidine in a sterile fashion, and a sterile drape was applied covering the operative field. A sterile gown and sterile gloves were used for the procedure. Local anesthesia was provided with 1% Lidocaine. Ultrasound was performed to localize left supraclavicular lymph nodes. An enlarged lymph node was targeted for biopsy. A 17 gauge guide needle was advanced to the margin of the lymph node. Four separate coaxial 18 gauge core biopsy samples were obtained within a left supraclavicular lymph node. Two samples were submitted on saline soaked Telfa gauze and 2 samples in formalin. Additional ultrasound was performed after biopsy. COMPLICATIONS: None immediate. FINDINGS: At least two separate enlarged adjacent left supraclavicular lymph nodes are identified which correspond to hypermetabolic lymph nodes seen by recent PET scan. The larger measures approximately 2.5 x 1.1 x 2.2 cm. Solid tissue samples were obtained. IMPRESSION: Ultrasound-guided core biopsy performed of an enlarged left supraclavicular lymph node measuring up to 2.5 cm in greatest diameter. Electronically Signed   By: Irish Lack M.D.   On: 01/06/2024 14:00    Medications: I have reviewed the patient's current medications.   Assessment/Plan: Pancreas cancer 11/19/2022: 2.9 cm thin-walled cystic lesion in the pancreas uncinate 07/28/2023: CT abdomen/pelvis: Stable 2.8 cm cystic  area in the uncinate process 11/29/2023: CT chest-subsegmental and segmental pulmonary emboli in the left upper lobe, dilation of the right ventricle, multiple pulmonary nodules 12/26/2023: CA 19-9- 40,981 12/27/2023: PET-5 cm hypermetabolic  pancreas head/uncinate mass, numerous hypermetabolic pulmonary nodules, hypermetabolic left supraclavicular and retroperitoneal lymph nodes 01/06/2024: Left supraclavicular lymph node biopsy-metastatic moderately differentiated adenocarcinoma, 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  CVAs-visual disturbance and altered mental status 12/30/2023 MRI brain-many foci of restricted diffusion in the cerebellum, left frontal, left basal ganglia, and parietal lobes compatible with acute/subacute infarcts, consider embolic etiology 4.  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  Kidney stones 6.  Gout 7.  Family history of pancreas cancer 8.  History of basal cell carcinoma    Disposition: Timothy Wilcox appears stable.  The clinical presentation and pathology are consistent with a diagnosis of metastatic pancreas cancer.  I discussed the pathology report, diagnosis, and treatment options with Timothy Wilcox and his wife.  We discussed the prognosis.  He understands the expected survival of months in patients with metastatic pancreas cancer.  Goals of treatment include palliation of symptoms and the potential for extension of survival.  We discussed comfort/supportive care versus a trial of systemic therapy.  We discussed multiagent chemotherapy regimens including FOLFIRINOX/ NALIRIFOX and gemcitabine/Abraxane.  I think it would be difficult for Timothy Wilcox to tolerate an oxaliplatin/irinotecan based regimen due to his age and performance status.  I recommend  gemcitabine/Abraxane.  We reviewed potential toxicities associated with the gemcitabine/Abraxane regimen including the chance of nausea, alopecia, hematologic toxicity, infection, and bleeding.  We discussed the rash, fever, and pneumonitis associated with gemcitabine.  We reviewed the allergic reaction and neuropathy seen with Abraxane.  He agrees to proceed.  Timothy Wilcox we will be referred for Port-A-Cath placement with the plan to begin treatment on 01/16/2024.  He will continue Lovenox anticoagulation for treatment of DVT/pulmonary embolism and as prophylaxis against further strokes.  He will continue hydrocodone as needed for pain.  He will begin a trial of lorazepam for insomnia.  A treatment plan was entered today.  Timothy Wilcox will return to the lab today for a baseline CBC, CMP, and genetic testing.  Thornton Papas, MD  01/08/2024  11:39 AM

## 2024-01-08 NOTE — Progress Notes (Signed)
 Fax from [facility] that testing order has been received and they will obtain tissue.

## 2024-01-09 ENCOUNTER — Telehealth: Payer: Self-pay | Admitting: Genetic Counselor

## 2024-01-09 ENCOUNTER — Other Ambulatory Visit: Payer: Self-pay | Admitting: *Deleted

## 2024-01-09 ENCOUNTER — Other Ambulatory Visit: Payer: Self-pay

## 2024-01-09 ENCOUNTER — Other Ambulatory Visit: Payer: Self-pay | Admitting: Oncology

## 2024-01-09 DIAGNOSIS — C250 Malignant neoplasm of head of pancreas: Secondary | ICD-10-CM

## 2024-01-09 LAB — CANCER ANTIGEN 19-9: CA 19-9: 23503 U/mL — ABNORMAL HIGH (ref 0–35)

## 2024-01-09 NOTE — Telephone Encounter (Signed)
 Per RN Wendy/Dr. Truett Perna-- POC genetic testing ordered for Mr. Lawhorne through [employer] w/ reflex to [employer]

## 2024-01-10 ENCOUNTER — Other Ambulatory Visit: Payer: Self-pay

## 2024-01-10 NOTE — Progress Notes (Signed)
 Pharmacist Chemotherapy Monitoring - Initial Assessment    Anticipated start date: 01/16/24   The following has been reviewed per standard work regarding the patient's treatment regimen: The patient's diagnosis, treatment plan and drug doses, and organ/hematologic function Lab orders and baseline tests specific to treatment regimen  The treatment plan start date, drug sequencing, and pre-medications Prior authorization status  Patient's documented medication list, including drug-drug interaction screen and prescriptions for anti-emetics and supportive care specific to the treatment regimen The drug concentrations, fluid compatibility, administration routes, and timing of the medications to be used The patient's access for treatment and lifetime cumulative dose history, if applicable  The patient's medication allergies and previous infusion related reactions, if applicable   Changes made to treatment plan:  N/A  Follow up needed:  N/A   Daylene Katayama, RPH, 01/10/2024  1:32 PM

## 2024-01-12 ENCOUNTER — Other Ambulatory Visit: Payer: Self-pay | Admitting: Oncology

## 2024-01-13 ENCOUNTER — Other Ambulatory Visit: Payer: Self-pay | Admitting: Radiology

## 2024-01-14 ENCOUNTER — Other Ambulatory Visit: Payer: Self-pay | Admitting: Radiology

## 2024-01-14 NOTE — H&P (Signed)
 Chief Complaint: Pancreatic cancer - IR consulted for port-a-cath placement for chemotherapy  Referring Provider(s): Thornton Papas, MD  Supervising Physician: Richarda Overlie  Patient Status: [facility] - Out-pt  History of Present Illness: Timothy Wilcox is a 83 y.o. male with pmhx of HLD, CAD, presents to IR service for port placement for chemotherapy secondary to pancreatic cancer. Established with Dr. Truett Perna with oncology. At recent visit on 01/08/24, decision was made by patient to proceed with chemotherapy. Pt now referred to IR service for port-a-cath placement.  PMHx, meds, labs, imaging, allergies reviewed. Feels well, no recent fevers, chills, illness. Has been NPO today as directed.    Patient is DNR  Past Medical History:  Diagnosis Date   Arthritis    CAD (coronary artery disease) 02/26/2018   A. Inf STEMI 6/18: LHC - pLCx 60, dRCA 100, EF 45-50 >> PCI:  DES to RCA // B. Echo 8/18: EF 60-65, nwm, grade 1 dd, MAC. C. 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   Hyperlipemia    intol to some statins   IBS (irritable bowel syndrome)    Medication intolerance    severe SOB due to Brilinta >> changed to Plavix   Renal disorder    kidney stones   Seasonal allergies    Sinus congestion    chronic   Sleep apnea    uses a c-pap    Past Surgical History:  Procedure Laterality Date   COLONOSCOPY     CORONARY STENT INTERVENTION N/A 04/03/2017   Procedure: Coronary Stent Intervention;  Surgeon: Tonny Bollman, MD;  Location: [facility] INVASIVE CV LAB;  Service: Cardiovascular;  Laterality: N/A;   CORONARY STENT INTERVENTION N/A 02/27/2018   Procedure: CORONARY STENT INTERVENTION;  Surgeon: Lennette Bihari, MD;  Location: [facility] INVASIVE CV LAB;  Service: Cardiovascular;  Laterality: N/A;   INCISION AND DRAINAGE  2011   infected finger   LEFT HEART CATH AND CORONARY ANGIOGRAPHY N/A 04/03/2017   Procedure: Left Heart Cath and Coronary  Angiography;  Surgeon: Tonny Bollman, MD;  Location: [facility] INVASIVE CV LAB;  Service: Cardiovascular;  Laterality: N/A;   LEFT HEART CATH AND CORONARY ANGIOGRAPHY N/A 02/27/2018   Procedure: LEFT HEART CATH AND CORONARY ANGIOGRAPHY;  Surgeon: Lennette Bihari, MD;  Location: [facility] INVASIVE CV LAB;  Service: Cardiovascular;  Laterality: N/A;   SHOULDER ARTHROSCOPY WITH ROTATOR CUFF REPAIR AND SUBACROMIAL DECOMPRESSION Right 01/29/2013   Procedure: RIGHT SHOULDER ARTHROSCOPY WITH SUBACROMIAL DECOMPRESSION, THREE TENDON ROTATOR CUFF REPAIR;  Surgeon: Wyn Forster., MD;  Location: [facility];  Service: Orthopedics;  Laterality: Right;   TOE DEBRIDEMENT     rt toe cyst    Allergies: Brilinta [ticagrelor], Plavix [clopidogrel], Statins, Clotrimazole-betamethasone, Potassium citrate, Tagamet [cimetidine], Tizanidine hcl, Zyrtec [cetirizine hcl], and Dicyclomine  Medications: Prior to Admission medications   Medication Sig Start Date End Date Taking? Authorizing Provider  allopurinol (ZYLOPRIM) 300 MG tablet Take 300 mg by mouth daily.    [provider]  amLODipine (NORVASC) 5 MG tablet TAKE 1 TABLET BY MOUTH DAILY 11/14/23   Tonny Bollman, MD  aspirin EC 81 MG tablet Take 81 mg by mouth daily.    [provider]  enoxaparin (LOVENOX) 150 MG/ML injection Inject 1 mL (150 mg total) into the skin daily. 12/31/23   Ladene Artist, MD  fluticasone (FLONASE) 50 MCG/ACT nasal spray Place 2 sprays into both nostrils See admin  instructions. Instill 2 sprays into both nostrils in the morning and evening    [provider]  HYDROcodone-acetaminophen (NORCO/VICODIN) 5-325 MG tablet Take 1 tablet by mouth every 6 (six) hours as needed for moderate pain (pain score 4-6). 12/31/23   Ladene Artist, MD  hydrocortisone 1 % ointment Apply 1 Application topically 2 (two) times daily as needed for itching.    [provider]  ibuprofen (ADVIL,MOTRIN) 200 MG tablet Take  400 mg by mouth every 6 (six) hours as needed for mild pain (pain score 1-3) (lower back pain).    [provider]  ketoconazole (NIZORAL) 2 % shampoo Apply 1 Application topically See admin instructions. Use as a shampoo once a week    [provider]  Lactobacillus Rhamnosus, GG, (CULTURELLE) CAPS Take 1 capsule by mouth in the morning.    [provider]  loratadine (CLARITIN) 10 MG tablet Take 10 mg by mouth See admin instructions. Take 10 mg by mouth in the morning and evening    [provider]  LORazepam (ATIVAN) 0.5 MG tablet Take 1 tablet (0.5 mg total) by mouth at bedtime as needed for sleep. 01/08/24   Ladene Artist, MD  Menthol-Methyl Salicylate (SALONPAS PAIN RELIEF PATCH) PTCH Apply 1 patch topically daily as needed (for pain). Patient not taking: Reported on 12/31/2023    [provider]  Multiple Vitamin (MULTIVITAMIN WITH MINERALS) TABS Take 1 tablet by mouth daily with breakfast.    [provider]  ondansetron (ZOFRAN) 8 MG tablet Take 1 tablet (8 mg total) by mouth every 8 (eight) hours as needed for nausea or vomiting. 01/08/24   Ladene Artist, MD  pantoprazole (PROTONIX) 40 MG tablet Take 1 tablet (40 mg total) by mouth daily. Patient not taking: Reported on 01/08/2024 12/25/23   [name], MD  PRESCRIPTION MEDICATION CPAP- At bedtime    [provider]  prochlorperazine (COMPAZINE) 10 MG tablet Take 1 tablet (10 mg total) by mouth every 6 (six) hours as needed. 01/08/24   Ladene Artist, MD  rosuvastatin (CRESTOR) 5 MG tablet Take 1 tablet (5 mg total) by mouth at bedtime. 12/01/23   Deanna Artis, DO  tacrolimus (PROTOPIC) 0.1 % ointment Apply 1 Application topically daily as needed (for rashes- affected areas). Patient not taking: Reported on 01/08/2024    [provider]  Wheat Dextrin (BENEFIBER) POWD Take 4 g by mouth See admin instructions. Mix 4 grams (1 teaspoonful) into a desired beverage  and drink in the morning    [provider]     Family History  Problem Relation Age of Onset   Skin cancer Mother    Pancreatic cancer Mother    Skin cancer Father     Social History   Socioeconomic History   Marital status: Married    Spouse name: Not on file   Number of children: Not on file   Years of education: Not on file   Highest education level: Not on file  Occupational History   Not on file  Tobacco Use   Smoking status: Never   Smokeless tobacco: Never  Vaping Use   Vaping status: Never Used  Substance and Sexual Activity   Alcohol use: Yes    Comment: a bottle of wine every night   Drug use: No   Sexual activity: Not on file  Other Topics Concern   Not on file  Social History Narrative   Not on file   Social  Drivers of Corporate investment banker Strain: Not on file  Food Insecurity: No Food Insecurity (12/31/2023)   Hunger Vital Sign    Worried About Running Out of Food in the Last Year: Never true    Ran Out of Food in the Last Year: Never true  Transportation Needs: No Transportation Needs (12/31/2023)   PRAPARE - Administrator, Civil Service (Medical): No    Lack of Transportation (Non-Medical): No  Physical Activity: Not on file  Stress: Not on file  Social Connections: Moderately Integrated (12/09/2023)   Social Connection and Isolation Panel [NHANES]    Frequency of Communication with Friends and Family: More than three times a week    Frequency of Social Gatherings with Friends and Family: Once a week    Attends Religious Services: Never    Database administrator or Organizations: Yes    Attends Engineer, structural: More than 4 times per year    Marital Status: Married     Review of Systems: A 12 point ROS discussed and pertinent positives are indicated in the HPI above.  All other systems are negative.  Review of Systems  Vital Signs: There were no vitals taken for this visit.  Advance Care Plan: The  advanced care place/surrogate decision maker was discussed at the time of visit and the patient did not wish to discuss or was not able to name a surrogate decision maker or provide an advance care plan.  Physical Exam Constitutional:      Appearance: Normal appearance.  HENT:     Mouth/Throat:     Mouth: Mucous membranes are moist.     Pharynx: Oropharynx is clear.  Cardiovascular:     Rate and Rhythm: Normal rate and regular rhythm.     Heart sounds: Normal heart sounds.  Pulmonary:     Effort: Pulmonary effort is normal. No respiratory distress.     Breath sounds: Normal breath sounds.  Skin:    General: Skin is warm and dry.     Coloration: Skin is not jaundiced.  Neurological:     General: No focal deficit present.     Mental Status: He is alert and oriented to person, place, and time.  Psychiatric:        Mood and Affect: Mood normal.        Thought Content: Thought content normal.     Imaging: [place] CORE BIOPSY (LYMPH NODES) Result Date: 01/06/2024 INDICATION: Pancreatic mass, lung nodules and lymphadenopathy. Enlarged and hypermetabolic left supraclavicular lymph nodes are targeted for biopsy. EXAM: ULTRASOUND GUIDED CORE BIOPSY OF LEFT SUPRACLAVICULAR LYMPH NODE MEDICATIONS: None. ANESTHESIA/SEDATION: None PROCEDURE: The procedure, risks, benefits, and alternatives were explained to the patient. Questions regarding the procedure were encouraged and answered. The patient understands and consents to the procedure. A time-out was performed prior to initiating the procedure. The left neck was prepped with chlorhexidine in a sterile fashion, and a sterile drape was applied covering the operative field. A sterile gown and sterile gloves were used for the procedure. Local anesthesia was provided with 1% Lidocaine. Ultrasound was performed to localize left supraclavicular lymph nodes. An enlarged lymph node was targeted for biopsy. A 17 gauge guide needle was advanced to the margin of the  lymph node. Four separate coaxial 18 gauge core biopsy samples were obtained within a left supraclavicular lymph node. Two samples were submitted on saline soaked Telfa gauze and 2 samples in formalin. Additional ultrasound was performed after biopsy. COMPLICATIONS:  None immediate. FINDINGS: At least two separate enlarged adjacent left supraclavicular lymph nodes are identified which correspond to hypermetabolic lymph nodes seen by recent PET scan. The larger measures approximately 2.5 x 1.1 x 2.2 cm. Solid tissue samples were obtained. IMPRESSION: Ultrasound-guided core biopsy performed of an enlarged left supraclavicular lymph node measuring up to 2.5 cm in greatest diameter. Electronically Signed   By: Irish Lack M.D.   On: 01/06/2024 14:00   MR BRAIN W WO CONTRAST Result Date: 12/30/2023 CLINICAL DATA:  Metastatic disease evaluation EXAM: MRI HEAD WITHOUT AND WITH CONTRAST TECHNIQUE: Multiplanar, multiecho pulse sequences of the brain and surrounding structures were obtained without and with intravenous contrast. CONTRAST:  10mL GADAVIST GADOBUTROL 1 MMOL/ML IV SOLN COMPARISON:  None Available. FINDINGS: Brain: Many small foci of restricted diffusion in the cerebellum, left frontal and parietal lobes, and left basal ganglia are compatible with acute infarcts. Curvilinear enhancement in the left parietal lobe. No midline shift. No acute hemorrhage. No hydrocephalus. Small remote cerebellar infarcts. Vascular: Major arterial flow voids are maintained. Skull and upper cervical spine: Normal marrow signal. Sinuses/Orbits: Clear sinuses.  No acute orbital findings. IMPRESSION: 1. 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 Given involvement of multiple vascular territories, consider embolic etiology. 2. Curvilinear enhancement in the left parietal lobe most likely represents evolving,  enhancing subacute infarct; however, given the clinical history short interval follow-up MRI with contrast in approximately 6 weeks is recommended to ensure expected evolution and exclude metastatic disease. These results will be called to the ordering clinician or representative by the Radiologist Assistant, and communication documented in the PACS or [employer]. Electronically Signed   By: Feliberto Harts M.D.   On: 12/30/2023 21:26   NM PET Image Initial (PI) Skull Base To Thigh Result Date: 12/29/2023 CLINICAL DATA:  Initial treatment strategy for pulmonary metastatic disease and pancreatic lesion recent CT scans. EXAM: NUCLEAR MEDICINE PET SKULL BASE TO THIGH TECHNIQUE: 11.61 mCi F-18 FDG was injected intravenously. Full-ring PET imaging was performed from the skull base to thigh after the radiotracer. CT data was obtained and used for attenuation correction and anatomic localization. Fasting blood glucose: 122 mg/dl COMPARISON:  Chest CT 45/40/9811 FINDINGS: Mediastinal blood pool activity: SUV max 1.85 Liver activity: SUV max NA NECK: Small hypermetabolic lymph adjacent to the left submandibular gland with SUV max of 8.84. No other enlarged or hypermetabolic cervical nodes. Incidental CT findings: None. CHEST: 2 adjacent left supraclavicular nodes are hypermetabolic. The smaller anterior no measures 8 mm and SUV max is 4.70 the larger more posterior node measures 12 mm and SUV max is 0.98. Numerous hypermetabolic pulmonary nodules consistent with metastatic disease. 19 mm right apical nodule has an SUV max of 744. 2.17 right upper lobe lesion has an SUV max of 3.56. 20 mm right lower lobe nodule has an SUV max 2.85. No enlarged or mediastinal or hilar lymph nodes. Incidental CT findings: Scattered atherosclerotic calcifications involving the aorta and coronary arteries. Calcified mediastinal hilar lymph nodes consistent with remote granulomatous disease. ABDOMEN/PELVIS: Hypermetabolic lower  pancreatic head/uncinate process mass measuring a maximum 5.0 x 3.5 cm has an SUV max of 11.39. This is surrounding the cystic area was seen on prior CT scan. 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 No findings for hepatic metastatic disease. Adrenal glands, spleen and kidneys are unremarkable. There are hypermetabolic retroperitoneal nodes. Upper Interaortocaval  nodes have an SUV max of 4.41. Lower nodes have an SUV max of 6.47. Incidental CT findings: Small hiatal hernia. Advanced aortic calcifications. 3.7 cm infrarenal abdominal aortic aneurysm. Recommend follow-up ultrasound every 3 years. (Ref.: J Vasc Surg. 2018; 67:2-77 and J Am Coll Radiol 2013;10(10):789-794.). Bilateral renal calculi. SKELETON: No focal hypermetabolic activity to suggest skeletal metastasis. Incidental CT findings: Scoliosis and age related degenerative changes. No lytic or sclerotic bone lesions. IMPRESSION: 1. 5.0 x 3.5 cm hypermetabolic lower pancreatic head/uncinate process mass consistent with adenocarcinoma. 2. Numerous hypermetabolic pulmonary nodules consistent with metastatic pulmonary disease. 3. Hypermetabolic left supraclavicular and retroperitoneal lymph nodes consistent with metastatic disease. The left supraclavicular nodes should be amenable to ultrasound-guided biopsy for tissue diagnosis. 4. No findings for hepatic metastatic disease or skeletal metastasis. 5. 3.7 cm infrarenal abdominal aortic aneurysm. Recommend follow-up ultrasound every 3 years. 6. Aortic atherosclerosis. Electronically Signed   By: Rudie Meyer M.D.   On: 12/29/2023 10:22    Labs:  CBC: Recent Labs    12/01/23 0416 12/09/23 1251 12/10/23 0527 01/08/24 1350  WBC 8.8 7.6 8.5 7.1  HGB 12.7* 14.6 13.0 13.5  HCT 38.5* 44.1 39.4 40.5  PLT 193 243 214 253    COAGS: Recent Labs    11/30/23 0010  INR 1.1    BMP: Recent Labs    12/01/23 0416 12/09/23 1251 12/10/23 0527 01/08/24 1350   NA 137 137 133* 138  K 4.2 4.0 3.9 4.5  CL 107 108 103 104  CO2 20* 19* 20* 26  GLUCOSE 127* 195* 117* 128*  BUN 16 16 15 15   CALCIUM 8.8* 9.6 9.0 9.8  CREATININE 1.19 0.99 0.90 0.98  GFRNONAA >60 >60 >60 >60    LIVER FUNCTION TESTS: Recent Labs    11/30/23 0010 12/01/23 0416 12/09/23 1251 01/08/24 1350  BILITOT 0.8 0.7 0.6 0.4  AST 29 23 26 30   ALT 29 22 29  48*  ALKPHOS 62 52 69 61  PROT 7.2 5.8* 7.2 7.0  ALBUMIN 3.7 3.0* 3.7 4.1    TUMOR MARKERS: Recent Labs    12/26/23 1022  CA199 66,286*    Assessment and Plan: Pancreatic cancer Plan for port placement.  Risks and benefits of image-guided Port-a-catheter placement were discussed with the patient including, but not limited to bleeding, infection, pneumothorax, or fibrin sheath development and need for additional procedures. All of the patient's questions were answered, patient is agreeable to proceed. Consent signed and in chart.   Thank you for allowing our service to participate in ERICE AHLES 's care.  Electronically Signed: Brayton El PA-C Interventional Radiology 01/15/2024 12:43 PM      I spent a total of    15 Minutes in face to face in clinical consultation, greater than 50% of which was counseling/coordinating care for port-a-cath placement.

## 2024-01-15 ENCOUNTER — Other Ambulatory Visit: Payer: Self-pay

## 2024-01-15 ENCOUNTER — Encounter: Payer: Self-pay | Admitting: *Deleted

## 2024-01-15 ENCOUNTER — Ambulatory Visit (HOSPITAL_COMMUNITY)
Admission: RE | Admit: 2024-01-15 | Discharge: 2024-01-15 | Disposition: A | Discharge status: Home / Self Care | Source: Ambulatory Visit | Attending: Oncology | Admitting: Oncology

## 2024-01-15 DIAGNOSIS — C250 Malignant neoplasm of head of pancreas: Secondary | ICD-10-CM

## 2024-01-15 DIAGNOSIS — Z66 Do not resuscitate: Secondary | ICD-10-CM | POA: Insufficient documentation

## 2024-01-15 DIAGNOSIS — C259 Malignant neoplasm of pancreas, unspecified: Secondary | ICD-10-CM | POA: Diagnosis not present

## 2024-01-15 HISTORY — PX: IR IMAGING GUIDED PORT INSERTION: IMG5740

## 2024-01-15 MED ORDER — MIDAZOLAM HCL 2 MG/2ML IJ SOLN
INTRAMUSCULAR | Status: AC
  Filled 2024-01-15: qty 2

## 2024-01-15 MED ORDER — SODIUM CHLORIDE 0.9 % IV SOLN: INTRAVENOUS | Status: DC

## 2024-01-15 MED ORDER — FENTANYL CITRATE (PF) 100 MCG/2ML IJ SOLN
INTRAMUSCULAR | Status: AC | PRN
  Administered 2024-01-15 (×2): 25 ug via INTRAVENOUS
  Administered 2024-01-15: 50 ug via INTRAVENOUS
  Administered 2024-01-15: 25 ug via INTRAVENOUS

## 2024-01-15 MED ORDER — HEPARIN SOD (PORK) LOCK FLUSH 100 UNIT/ML IV SOLN
INTRAVENOUS | Status: AC
  Filled 2024-01-15: qty 5

## 2024-01-15 MED ORDER — FENTANYL CITRATE (PF) 100 MCG/2ML IJ SOLN
INTRAMUSCULAR | Status: AC
Start: 2024-01-15 — End: ?
  Filled 2024-01-15: qty 2

## 2024-01-15 MED ORDER — LIDOCAINE-EPINEPHRINE 1 %-1:100000 IJ SOLN
INTRAMUSCULAR | Status: AC
  Filled 2024-01-15: qty 1

## 2024-01-15 MED ORDER — LIDOCAINE HCL 1 % IJ SOLN
INTRAMUSCULAR | Status: AC
  Filled 2024-01-15: qty 20

## 2024-01-15 MED ORDER — FENTANYL CITRATE (PF) 100 MCG/2ML IJ SOLN
INTRAMUSCULAR | Status: AC
  Filled 2024-01-15: qty 2

## 2024-01-15 MED ORDER — MIDAZOLAM HCL 2 MG/2ML IJ SOLN
INTRAMUSCULAR | Status: AC | PRN
  Administered 2024-01-15: .5 mg via INTRAVENOUS
  Administered 2024-01-15 (×2): 1 mg via INTRAVENOUS
  Administered 2024-01-15: .5 mg via INTRAVENOUS

## 2024-01-15 NOTE — Procedures (Signed)
Interventional Radiology Procedure:   Indications: Metastatic pancreatic cancer  Procedure: Port placement  Findings: Right jugular port, tip at SVC/RA junction  Complications: None     EBL: Minimal, less than 10 ml  Plan: Discharge in one hour.  Keep port site and incisions dry for at least 24 hours.     Alecsander Hattabaugh R. Anselm Pancoast, MD  Pager: [phone]

## 2024-01-15 NOTE — Progress Notes (Signed)
 Fax from [facility] that tissue has been received for testing.

## 2024-01-16 ENCOUNTER — Inpatient Hospital Stay

## 2024-01-16 VITALS — BP 131/71 | HR 83 | Temp 98.0°F | Resp 20 | Wt 229.3 lb

## 2024-01-16 DIAGNOSIS — Z5111 Encounter for antineoplastic chemotherapy: Secondary | ICD-10-CM | POA: Diagnosis not present

## 2024-01-16 DIAGNOSIS — C250 Malignant neoplasm of head of pancreas: Secondary | ICD-10-CM

## 2024-01-16 MED ORDER — PROCHLORPERAZINE MALEATE 10 MG PO TABS
10.0000 mg | ORAL_TABLET | Freq: Once | ORAL | Status: AC
  Administered 2024-01-16: 10 mg via ORAL
  Filled 2024-01-16: qty 1

## 2024-01-16 MED ORDER — SODIUM CHLORIDE 0.9 % IV SOLN
INTRAVENOUS | Status: DC
Start: 2024-01-16 — End: 2024-01-16

## 2024-01-16 MED ORDER — HEPARIN SOD (PORK) LOCK FLUSH 100 UNIT/ML IV SOLN
500.0000 [IU] | Freq: Once | INTRAVENOUS | Status: AC | PRN
  Administered 2024-01-16: 500 [IU]

## 2024-01-16 MED ORDER — SODIUM CHLORIDE 0.9 % IV SOLN
2200.0000 mg | Freq: Once | INTRAVENOUS | Status: AC
  Administered 2024-01-16: 2200 mg via INTRAVENOUS
  Filled 2024-01-16: qty 52.57

## 2024-01-16 MED ORDER — PACLITAXEL PROTEIN-BOUND CHEMO INJECTION 100 MG
100.0000 mg/m2 | Freq: Once | INTRAVENOUS | Status: AC
Start: 2024-01-16 — End: 2024-01-16
  Administered 2024-01-16: 225 mg via INTRAVENOUS
  Filled 2024-01-16: qty 45

## 2024-01-16 MED ORDER — SODIUM CHLORIDE 0.9% FLUSH
10.0000 mL | INTRAVENOUS | Status: DC | PRN
  Administered 2024-01-16: 10 mL

## 2024-01-16 NOTE — Patient Instructions (Signed)
 [facility] A DEPT OF [facility]  Discharge Instructions: Thank you for choosing [facility] to provide your oncology and hematology care.   If you have a lab appointment with the Cancer Center, please go directly to the Cancer Center and check in at the registration area.   Wear comfortable clothing and clothing appropriate for easy access to any Portacath or PICC line.   We strive to give you quality time with your provider. You may need to reschedule your appointment if you arrive late (15 or more minutes).  Arriving late affects you and other patients whose appointments are after yours.  Also, if you miss three or more appointments without notifying the office, you may be dismissed from the clinic at the provider's discretion.      For prescription refill requests, have your pharmacy contact our office and allow 72 hours for refills to be completed.    Today you received the following chemotherapy and/or immunotherapy agents abraxane and gemzar.      To help prevent nausea and vomiting after your treatment, we encourage you to take your nausea medication as directed.  BELOW ARE SYMPTOMS THAT SHOULD BE REPORTED IMMEDIATELY: *FEVER GREATER THAN 100.4 F (38 C) OR HIGHER *CHILLS OR SWEATING *NAUSEA AND VOMITING THAT IS NOT CONTROLLED WITH YOUR NAUSEA MEDICATION *UNUSUAL SHORTNESS OF BREATH *UNUSUAL BRUISING OR BLEEDING *URINARY PROBLEMS (pain or burning when urinating, or frequent urination) *BOWEL PROBLEMS (unusual diarrhea, constipation, pain near the anus) TENDERNESS IN MOUTH AND THROAT WITH OR WITHOUT PRESENCE OF ULCERS (sore throat, sores in mouth, or a toothache) UNUSUAL RASH, SWELLING OR PAIN  UNUSUAL VAGINAL DISCHARGE OR ITCHING   Items with * indicate a potential emergency and should be followed up as soon as possible or go to the Emergency Department if any problems should occur.  Please show the CHEMOTHERAPY ALERT CARD or  IMMUNOTHERAPY ALERT CARD at check-in to the Emergency Department and triage nurse.  Should you have questions after your visit or need to cancel or reschedule your appointment, please contact [facility] - A DEPT [facility]  Dept: [phone]  and follow the prompts.  Office hours are 8:00 a.m. to 4:30 p.m. Monday - Friday. Please note that voicemails left after 4:00 p.m. may not be returned until the following business day.  We are closed weekends and major holidays. You have access to a nurse at all times for urgent questions. Please call the main number to the clinic Dept: [phone] and follow the prompts.   For any non-urgent questions, you may also contact your provider using MyChart. We now offer e-Visits for anyone 59 and older to request care online for non-urgent symptoms. For details visit [email or web address].   Also download the MyChart app! Go to the app store, search "MyChart", open the app, select , and log in with your MyChart username and password.  Paclitaxel Nanoparticle Albumin-Bound Injection What is this medication? NANOPARTICLE ALBUMIN-BOUND PACLITAXEL (Na no PAHR ti kuhl al BYOO muhn-bound PAK li TAX el) treats some types of cancer. It works by slowing down the growth of cancer cells. This medicine may be used for other purposes; ask your health care provider or pharmacist if you have questions. COMMON BRAND NAME(S): Abraxane What should I tell my care team before I take this medication? They need to know if you have any of these conditions: Liver disease Low white blood cell levels An unusual or  allergic reaction to paclitaxel, albumin, other medications, foods, dyes, or preservatives If you or your partner are pregnant or trying to get pregnant Breast-feeding How should I use this medication? This medication is injected into a vein. It is given by your care team in a hospital or clinic setting. Talk to your care  team about the use of this medication in children. Special care may be needed. Overdosage: If you think you have taken too much of this medicine contact a poison control center or emergency room at once. NOTE: This medicine is only for you. Do not share this medicine with others. What if I miss a dose? Keep appointments for follow-up doses. It is important not to miss your dose. Call your care team if you are unable to keep an appointment. What may interact with this medication? Other medications may affect the way this medication works. Talk with your care team about all of the medications you take. They may suggest changes to your treatment plan to lower the risk of side effects and to make sure your medications work as intended. This list may not describe all possible interactions. Give your health care provider a list of all the medicines, herbs, non-prescription drugs, or dietary supplements you use. Also tell them if you smoke, drink alcohol, or use illegal drugs. Some items may interact with your medicine. What should I watch for while using this medication? Your condition will be monitored carefully while you are receiving this medication. You may need blood work while taking this medication. This medication may make you feel generally unwell. This is not uncommon as chemotherapy can affect healthy cells as well as cancer cells. Report any side effects. Continue your course of treatment even though you feel ill unless your care team tells you to stop. This medication can cause serious allergic reactions. To reduce the risk, your care team may give you other medications to take before receiving this one. Be sure to follow the directions from your care team. This medication may increase your risk of getting an infection. Call your care team for advice if you get a fever, chills, sore throat, or other symptoms of a cold or flu. Do not treat yourself. Try to avoid being around people who are  sick. This medication may increase your risk to bruise or bleed. Call your care team if you notice any unusual bleeding. Be careful brushing or flossing your teeth or using a toothpick because you may get an infection or bleed more easily. If you have any dental work done, tell your dentist you are receiving this medication. Talk to your care team if you or your partner may be pregnant. Serious birth defects can occur if you take this medication during pregnancy and for 6 months after the last dose. You will need a negative pregnancy test before starting this medication. Contraception is recommended while taking this medication and for 6 months after the last dose. Your care team can help you find the option that works for you. If your partner can get pregnant, use a condom during sex while taking this medication and for 3 months after the last dose. Do not breastfeed while taking this medication and for 2 weeks after the last dose. This medication may cause infertility. Talk to your care team if you are concerned about your fertility. What side effects may I notice from receiving this medication? Side effects that you should report to your care team as soon as possible: Allergic reactions--skin rash, itching,  hives, swelling of the face, lips, tongue, or throat Dry cough, shortness of breath or trouble breathing Infection--fever, chills, cough, sore throat, wounds that don't heal, pain or trouble when passing urine, general feeling of discomfort or being unwell Low red blood cell level--unusual weakness or fatigue, dizziness, headache, trouble breathing Pain, tingling, or numbness in the hands or feet Stomach pain, unusual weakness or fatigue, nausea, vomiting, diarrhea, or fever that lasts longer than expected Unusual bruising or bleeding Side effects that usually do not require medical attention (report to your care team if they continue or are bothersome): Diarrhea Fatigue Hair loss Loss of  appetite Nausea Vomiting This list may not describe all possible side effects. Call your doctor for medical advice about side effects. You may report side effects to FDA at 1-800-FDA-1088. Where should I keep my medication? This medication is given in a hospital or clinic. It will not be stored at home. NOTE: This sheet is a summary. It may not cover all possible information. If you have questions about this medicine, talk to your doctor, pharmacist, or health care provider.  2024 Elsevier/Gold Standard (2022-02-08 00:00:00)  Gemcitabine Injection What is this medication? GEMCITABINE (jem SYE ta been) treats some types of cancer. It works by slowing down the growth of cancer cells. This medicine may be used for other purposes; ask your health care provider or pharmacist if you have questions. COMMON BRAND NAME(S): Gemzar, Infugem What should I tell my care team before I take this medication? They need to know if you have any of these conditions: Blood disorders Infection Kidney disease Liver disease Lung or breathing disease, such as asthma or COPD Recent or ongoing radiation therapy An unusual or allergic reaction to gemcitabine, other medications, foods, dyes, or preservatives If you or your partner are pregnant or trying to get pregnant Breast-feeding How should I use this medication? This medication is injected into a vein. It is given by your care team in a hospital or clinic setting. Talk to your care team about the use of this medication in children. Special care may be needed. Overdosage: If you think you have taken too much of this medicine contact a poison control center or emergency room at once. NOTE: This medicine is only for you. Do not share this medicine with others. What if I miss a dose? Keep appointments for follow-up doses. It is important not to miss your dose. Call your care team if you are unable to keep an appointment. What may interact with this  medication? Interactions have not been studied. This list may not describe all possible interactions. Give your health care provider a list of all the medicines, herbs, non-prescription drugs, or dietary supplements you use. Also tell them if you smoke, drink alcohol, or use illegal drugs. Some items may interact with your medicine. What should I watch for while using this medication? Your condition will be monitored carefully while you are receiving this medication. This medication may make you feel generally unwell. This is not uncommon, as chemotherapy can affect healthy cells as well as cancer cells. Report any side effects. Continue your course of treatment even though you feel ill unless your care team tells you to stop. In some cases, you may be given additional medications to help with side effects. Follow all directions for their use. This medication may increase your risk of getting an infection. Call your care team for advice if you get a fever, chills, sore throat, or other symptoms of a cold  or flu. Do not treat yourself. Try to avoid being around people who are sick. This medication may increase your risk to bruise or bleed. Call your care team if you notice any unusual bleeding. Be careful brushing or flossing your teeth or using a toothpick because you may get an infection or bleed more easily. If you have any dental work done, tell your dentist you are receiving this medication. Avoid taking medications that contain aspirin, acetaminophen, ibuprofen, naproxen, or ketoprofen unless instructed by your care team. These medications may hide a fever. Talk to your care team if you or your partner wish to become pregnant or think you might be pregnant. This medication can cause serious birth defects if taken during pregnancy and for 6 months after the last dose. A negative pregnancy test is required before starting this medication. A reliable form of contraception is recommended while taking  this medication and for 6 months after the last dose. Talk to your care team about effective forms of contraception. Do not father a child while taking this medication and for 3 months after the last dose. Use a condom while having sex during this time period. Do not breastfeed while taking this medication and for at least 1 week after the last dose. This medication may cause infertility. Talk to your care team if you are concerned about your fertility. What side effects may I notice from receiving this medication? Side effects that you should report to your care team as soon as possible: Allergic reactions--skin rash, itching, hives, swelling of the face, lips, tongue, or throat Capillary leak syndrome--stomach or muscle pain, unusual weakness or fatigue, feeling faint or lightheaded, decrease in the amount of urine, swelling of the ankles, hands, or feet, trouble breathing Infection--fever, chills, cough, sore throat, wounds that don't heal, pain or trouble when passing urine, general feeling of discomfort or being unwell Liver injury--right upper belly pain, loss of appetite, nausea, light-colored stool, dark yellow or brown urine, yellowing skin or eyes, unusual weakness or fatigue Low red blood cell level--unusual weakness or fatigue, dizziness, headache, trouble breathing Lung injury--shortness of breath or trouble breathing, cough, spitting up blood, chest pain, fever Stomach pain, bloody diarrhea, pale skin, unusual weakness or fatigue, decrease in the amount of urine, which may be signs of hemolytic uremic syndrome Sudden and severe headache, confusion, change in vision, seizures, which may be signs of posterior reversible encephalopathy syndrome (PRES) Unusual bruising or bleeding Side effects that usually do not require medical attention (report to your care team if they continue or are bothersome): Diarrhea Drowsiness Hair loss Nausea Pain, redness, or swelling with sores inside the  mouth or throat Vomiting This list may not describe all possible side effects. Call your doctor for medical advice about side effects. You may report side effects to FDA at 1-800-FDA-1088. Where should I keep my medication? This medication is given in a hospital or clinic. It will not be stored at home. NOTE: This sheet is a summary. It may not cover all possible information. If you have questions about this medicine, talk to your doctor, pharmacist, or health care provider.  2024 Elsevier/Gold Standard (2022-01-30 00:00:00)

## 2024-01-17 ENCOUNTER — Encounter: Payer: Self-pay | Admitting: Genetic Counselor

## 2024-01-17 DIAGNOSIS — Z1379 Encounter for other screening for genetic and chromosomal anomalies: Secondary | ICD-10-CM | POA: Insufficient documentation

## 2024-01-20 ENCOUNTER — Encounter: Payer: Self-pay | Admitting: *Deleted

## 2024-01-20 ENCOUNTER — Telehealth: Payer: Self-pay | Admitting: *Deleted

## 2024-01-20 NOTE — Telephone Encounter (Signed)
 Timothy Wilcox reports episode of "bowel blockage" on Saturday night with severe abdominal pain and vomited x 1. Asking how to prevent this in the future. Suggested that he push fluids, eat plenty of fruits/vegetables. Can start MiraLax daily and Colace bid after each treatment for a few days to see if this prevents this again. Also having a lot of gas-informed him that Gas-X and Mylicon are helpful. Reports also a 15 minute episode of double-vision  yesterday. This has happened once in past while he was on Eliquis. 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 Wanted Dr. Enedina Harrow opinion on this.

## 2024-01-22 ENCOUNTER — Ambulatory Visit (HOSPITAL_COMMUNITY)

## 2024-01-22 ENCOUNTER — Encounter (HOSPITAL_COMMUNITY): Payer: Self-pay

## 2024-01-23 ENCOUNTER — Encounter: Payer: Self-pay | Admitting: Genetic Counselor

## 2024-01-23 ENCOUNTER — Encounter: Payer: Self-pay | Admitting: Oncology

## 2024-01-23 ENCOUNTER — Other Ambulatory Visit: Payer: Self-pay

## 2024-01-23 DIAGNOSIS — C250 Malignant neoplasm of head of pancreas: Secondary | ICD-10-CM | POA: Diagnosis not present

## 2024-01-23 DIAGNOSIS — K8689 Other specified diseases of pancreas: Secondary | ICD-10-CM

## 2024-01-23 MED ORDER — ZOLPIDEM TARTRATE 5 MG PO TABS: 5.0000 mg | ORAL_TABLET | Freq: Every evening | ORAL | 1 refills | Status: DC | PRN

## 2024-01-23 NOTE — Telephone Encounter (Signed)
 Negative genetic testing with VUS in EGFR.  Inbasket message sent to Diana Forster to disclose to patient at next appt.  If patient does have mother w/ pancreatic cancer (as noted in chart), Mr. Vanhouten's first degree relatives are candidates for considering pancreatic cancer screening per [employer].

## 2024-01-24 ENCOUNTER — Encounter: Payer: Self-pay | Admitting: Oncology

## 2024-01-25 ENCOUNTER — Other Ambulatory Visit: Payer: Self-pay | Admitting: Oncology

## 2024-01-27 ENCOUNTER — Other Ambulatory Visit (HOSPITAL_BASED_OUTPATIENT_CLINIC_OR_DEPARTMENT_OTHER): Payer: Self-pay

## 2024-01-27 ENCOUNTER — Other Ambulatory Visit: Payer: Self-pay | Admitting: *Deleted

## 2024-01-27 ENCOUNTER — Encounter: Payer: Self-pay | Admitting: Oncology

## 2024-01-27 DIAGNOSIS — K8689 Other specified diseases of pancreas: Secondary | ICD-10-CM

## 2024-01-27 MED ORDER — ZOLPIDEM TARTRATE 10 MG PO TABS: 10.0000 mg | ORAL_TABLET | Freq: Every evening | ORAL | 1 refills | Status: DC | PRN

## 2024-01-27 MED ORDER — ENOXAPARIN SODIUM 150 MG/ML IJ SOSY
150.0000 mg | PREFILLED_SYRINGE | INTRAMUSCULAR | 5 refills | Status: DC
  Filled 2024-01-27: qty 30, 30d supply, fill #0

## 2024-01-27 NOTE — Telephone Encounter (Signed)
 Mr. Primeau asking if MD will approve zolpidem  10 mg (5 mg not working). Need refill on enoxaparin  and asking to schedule his next chemo in May. Message sent to scheduler to work on his May chemo appointment. Enoxaparin  refilled.

## 2024-01-28 ENCOUNTER — Other Ambulatory Visit: Payer: Self-pay | Admitting: Oncology

## 2024-01-28 ENCOUNTER — Other Ambulatory Visit (HOSPITAL_BASED_OUTPATIENT_CLINIC_OR_DEPARTMENT_OTHER): Payer: Self-pay

## 2024-01-28 DIAGNOSIS — C250 Malignant neoplasm of head of pancreas: Secondary | ICD-10-CM

## 2024-01-29 ENCOUNTER — Other Ambulatory Visit (HOSPITAL_BASED_OUTPATIENT_CLINIC_OR_DEPARTMENT_OTHER): Payer: Self-pay

## 2024-01-29 ENCOUNTER — Other Ambulatory Visit: Payer: Self-pay

## 2024-01-30 ENCOUNTER — Inpatient Hospital Stay

## 2024-01-30 ENCOUNTER — Inpatient Hospital Stay (HOSPITAL_BASED_OUTPATIENT_CLINIC_OR_DEPARTMENT_OTHER): Admitting: Nurse Practitioner

## 2024-01-30 ENCOUNTER — Encounter: Payer: Self-pay | Admitting: Nurse Practitioner

## 2024-01-30 VITALS — BP 112/74 | HR 77 | Temp 98.2°F | Resp 18 | Ht 67.0 in | Wt 222.4 lb

## 2024-01-30 VITALS — BP 131/65 | HR 80 | Temp 98.1°F | Resp 18

## 2024-01-30 DIAGNOSIS — Z5111 Encounter for antineoplastic chemotherapy: Secondary | ICD-10-CM | POA: Diagnosis not present

## 2024-01-30 DIAGNOSIS — C250 Malignant neoplasm of head of pancreas: Secondary | ICD-10-CM

## 2024-01-30 DIAGNOSIS — Z95828 Presence of other vascular implants and grafts: Secondary | ICD-10-CM

## 2024-01-30 LAB — CBC WITH DIFFERENTIAL (CANCER CENTER ONLY)
Abs Immature Granulocytes: 0.08 10*3/uL — ABNORMAL HIGH (ref 0.00–0.07)
Basophils Absolute: 0.1 10*3/uL (ref 0.0–0.1)
Basophils Relative: 1 %
Eosinophils Absolute: 0.2 10*3/uL (ref 0.0–0.5)
Eosinophils Relative: 4 %
HCT: 38.6 % — ABNORMAL LOW (ref 39.0–52.0)
Hemoglobin: 13.2 g/dL (ref 13.0–17.0)
Immature Granulocytes: 1 %
Lymphocytes Relative: 22 %
Lymphs Abs: 1.2 10*3/uL (ref 0.7–4.0)
MCH: 32.3 pg (ref 26.0–34.0)
MCHC: 34.2 g/dL (ref 30.0–36.0)
MCV: 94.4 fL (ref 80.0–100.0)
Monocytes Absolute: 0.7 10*3/uL (ref 0.1–1.0)
Monocytes Relative: 13 %
Neutro Abs: 3.4 10*3/uL (ref 1.7–7.7)
Neutrophils Relative %: 59 %
Platelet Count: 424 10*3/uL — ABNORMAL HIGH (ref 150–400)
RBC: 4.09 MIL/uL — ABNORMAL LOW (ref 4.22–5.81)
RDW: 13.3 % (ref 11.5–15.5)
WBC Count: 5.7 10*3/uL (ref 4.0–10.5)
nRBC: 0 % (ref 0.0–0.2)

## 2024-01-30 LAB — CMP (CANCER CENTER ONLY)
ALT: 53 U/L — ABNORMAL HIGH (ref 0–44)
AST: 26 U/L (ref 15–41)
Albumin: 4 g/dL (ref 3.5–5.0)
Alkaline Phosphatase: 66 U/L (ref 38–126)
Anion gap: 9 (ref 5–15)
BUN: 14 mg/dL (ref 8–23)
CO2: 22 mmol/L (ref 22–32)
Calcium: 10 mg/dL (ref 8.9–10.3)
Chloride: 105 mmol/L (ref 98–111)
Creatinine: 0.97 mg/dL (ref 0.61–1.24)
GFR, Estimated: >60 mL/min (ref >60)
Glucose, Bld: 154 mg/dL — ABNORMAL HIGH (ref 70–99)
Potassium: 3.9 mmol/L (ref 3.5–5.1)
Sodium: 136 mmol/L (ref 135–145)
Total Bilirubin: 0.2 mg/dL (ref 0.0–1.2)
Total Protein: 6.4 g/dL — ABNORMAL LOW (ref 6.5–8.1)

## 2024-01-30 MED ORDER — HEPARIN SOD (PORK) LOCK FLUSH 100 UNIT/ML IV SOLN
500.0000 [IU] | Freq: Once | INTRAVENOUS | Status: AC | PRN
  Administered 2024-01-30: 500 [IU]

## 2024-01-30 MED ORDER — PACLITAXEL PROTEIN-BOUND CHEMO INJECTION 100 MG
100.0000 mg/m2 | Freq: Once | INTRAVENOUS | Status: AC
  Administered 2024-01-30: 225 mg via INTRAVENOUS
  Filled 2024-01-30: qty 45

## 2024-01-30 MED ORDER — SODIUM CHLORIDE 0.9 % IV SOLN
2200.0000 mg | Freq: Once | INTRAVENOUS | Status: AC
  Administered 2024-01-30: 2200 mg via INTRAVENOUS
  Filled 2024-01-30: qty 52.61

## 2024-01-30 MED ORDER — SODIUM CHLORIDE 0.9% FLUSH
10.0000 mL | Freq: Once | INTRAVENOUS | Status: AC
  Administered 2024-01-30: 10 mL via INTRAVENOUS

## 2024-01-30 MED ORDER — SODIUM CHLORIDE 0.9% FLUSH
10.0000 mL | INTRAVENOUS | Status: DC | PRN
  Administered 2024-01-30: 10 mL

## 2024-01-30 MED ORDER — SODIUM CHLORIDE 0.9 % IV SOLN: INTRAVENOUS | Status: DC

## 2024-01-30 MED ORDER — PROCHLORPERAZINE MALEATE 10 MG PO TABS
10.0000 mg | ORAL_TABLET | Freq: Once | ORAL | Status: DC
  Filled 2024-01-30: qty 1

## 2024-01-30 NOTE — Patient Instructions (Signed)
 [facility] - A DEPT OF [place]  Discharge Instructions: Thank you for choosing [facility] to provide your oncology and hematology care.   If you have a lab appointment with the Cancer Center, please go directly to the Cancer Center and check in at the registration area.   Wear comfortable clothing and clothing appropriate for easy access to any Portacath or PICC line.   We strive to give you quality time with your provider. You may need to reschedule your appointment if you arrive late (15 or more minutes).  Arriving late affects you and other patients whose appointments are after yours.  Also, if you miss three or more appointments without notifying the office, you may be dismissed from the clinic at the provider's discretion.      For prescription refill requests, have your pharmacy contact our office and allow 72 hours for refills to be completed.    Today you received the following chemotherapy and/or immunotherapy agents Abraxane  and Gemzar       To help prevent nausea and vomiting after your treatment, we encourage you to take your nausea medication as directed.  BELOW ARE SYMPTOMS THAT SHOULD BE REPORTED IMMEDIATELY: *FEVER GREATER THAN 100.4 F (38 C) OR HIGHER *CHILLS OR SWEATING *NAUSEA AND VOMITING THAT IS NOT CONTROLLED WITH YOUR NAUSEA MEDICATION *UNUSUAL SHORTNESS OF BREATH *UNUSUAL BRUISING OR BLEEDING *URINARY PROBLEMS (pain or burning when urinating, or frequent urination) *BOWEL PROBLEMS (unusual diarrhea, constipation, pain near the anus) TENDERNESS IN MOUTH AND THROAT WITH OR WITHOUT PRESENCE OF ULCERS (sore throat, sores in mouth, or a toothache) UNUSUAL RASH, SWELLING OR PAIN  UNUSUAL VAGINAL DISCHARGE OR ITCHING   Items with * indicate a potential emergency and should be followed up as soon as possible or go to the Emergency Department if any problems should occur.  Please show the CHEMOTHERAPY ALERT CARD or  IMMUNOTHERAPY ALERT CARD at check-in to the Emergency Department and triage nurse.  Should you have questions after your visit or need to cancel or reschedule your appointment, please contact [facility]  Dept: [phone]  and follow the prompts.  Office hours are 8:00 a.m. to 4:30 p.m. Monday - Friday. Please note that voicemails left after 4:00 p.m. may not be returned until the following business day.  We are closed weekends and major holidays. You have access to a nurse at all times for urgent questions. Please call the main number to the clinic Dept: [phone] and follow the prompts.   For any non-urgent questions, you may also contact your provider using MyChart. We now offer e-Visits for anyone 31 and older to request care online for non-urgent symptoms. For details visit [email or web address].   Also download the MyChart app! Go to the app store, search "MyChart", open the app, select , and log in with your MyChart username and password.

## 2024-01-30 NOTE — Progress Notes (Addendum)
 [facility] OFFICE PROGRESS NOTE   Diagnosis: Pancreas cancer  INTERVAL HISTORY:   Mr. Ludlum returns as scheduled.  He completed cycle 1 gemcitabine /Abraxane  01/16/2024.  He denies nausea/vomiting.  No mouth sores.  No fever or rash after treatment.  He developed severe constipation about a day and a half after treatment.  He took milk of magnesia and developed nausea.  Bowel habits now back to baseline.  Shortness of breath is intermittently better.  Main complaint is fatigue.  He has intermittently noted a small amount of blood with nose blowing.  Objective:  Vital signs in last 24 hours:  Blood pressure 112/74, pulse 77, temperature 98.2 F (36.8 C), temperature source Temporal, resp. rate 18, height 5\' 7"  (1.702 m), weight 222 lb 6.4 oz (100.9 kg), SpO2 100%.    HEENT: No thrush or ulcers. Resp: Lungs clear bilaterally Cardio: Regular rate GI: No hepatosplenomegaly Vascular: No leg edema Neuro: And oriented. Skin: A few resolving ecchymoses scattered over abdominal wall. Port-A-Cath without erythema.  Lab Results:  Lab Results  Component Value Date   WBC 5.7 01/30/2024   HGB 13.2 01/30/2024   HCT 38.6 (L) 01/30/2024   MCV 94.4 01/30/2024   PLT 424 (H) 01/30/2024   NEUTROABS 3.4 01/30/2024    Imaging:  No results found.  Medications: I have reviewed the patient's current medications.  Assessment/Plan: Pancreas cancer 11/19/2022: 2.9 cm thin-walled cystic lesion in the pancreas uncinate 07/28/2023: CT abdomen/pelvis: Stable 2.8 cm cystic area in the uncinate process 11/29/2023: CT chest-subsegmental and segmental pulmonary emboli in the left upper lobe, dilation of the right ventricle, multiple pulmonary nodules 12/26/2023: CA 19-9- 84,696 12/27/2023: PET-5 cm hypermetabolic pancreas head/uncinate mass, numerous hypermetabolic pulmonary nodules, hypermetabolic left supraclavicular and retroperitoneal lymph nodes 01/06/2024: Left supraclavicular lymph  node biopsy-metastatic moderately differentiated adenocarcinoma, 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  CVAs-visual disturbance and altered mental status 12/30/2023 MRI brain-many foci of restricted diffusion in the cerebellum, left frontal, left basal ganglia, and parietal lobes compatible with acute/subacute infarcts, consider embolic etiology 4.  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  Kidney stones 6.  Gout 7.  Family history of pancreas cancer 8.  History of basal cell carcinoma 9.  Negative genetic testing with VUS in EGFR.  First-degree relatives are candidates for considering pancreatic cancer screening based on family history.  Discussed with patient and wife 01/30/2024.  Disposition: Mr. Parekh appears stable.  He has completed 1 cycle of gemcitabine /Abraxane .  Overall he tolerated well.  Plan to proceed with cycle 2 today as scheduled.    He developed significant constipation a day or 2 after treatment.  We discussed the possibility the constipation was related to Zofran  which he took prophylactically the night of treatment.  He will try Compazine  if he develops nausea.  He will contact the office with further problems.  CBC and chemistry panel reviewed.  Labs adequate for treatment.  He will return for follow-up and treatment in 2 weeks.  We are available to see him  sooner if needed.  Diana Forster ANP/GNP-BC   01/30/2024  11:46 AM

## 2024-01-30 NOTE — Progress Notes (Signed)
 Patient seen by Lonna Cobb NP today  Vitals are within treatment parameters:Yes   Labs are within treatment parameters: Yes   Treatment plan has been signed: Yes   Per physician team, Patient is ready for treatment and there are NO modifications to the treatment plan.

## 2024-02-05 ENCOUNTER — Inpatient Hospital Stay: Admitting: Dietician

## 2024-02-05 NOTE — Progress Notes (Signed)
 Nutrition Assessment:  Reason for Assessment: MST screen for weight loss.    ASSESSMENT: Patient is 83 year old male with pancreatic cancer who is under treatment with Gemcitabine /Abraxane .  He is followed by Dr. Scherrie Curt and just saw NP last week and relayed no concerns with nausea/vomiting or mouth sores. He has PMHx that includes IBS (gaseous diarrheal) has been taking benefiber and probiotics to address. He has severe constipation following past treatment and has stool softeners and Miralax  available to address.  He reports no concerns with weight. He states it was a all time high followed by holiday eating that has just normalized to his usual range.  His PO intake is limited in variety of fruits and vegetables. He eats more fruit (bananas, apples, strawberries) than vegetable (corn and green beans) doesn't like any legumes.  His usual cereal is Cheerios or other oat based cold cereals hasn't had raisin bran since he was young but willing to try to address constipation.  Fluids: Hot tea in morning 10oz, and water 1-2 (25oz bottles)   Anthropometrics: fluctuations r/t fluids and bowels he states CBW is within usual range and not a concern  Height: 67" Weight: 222.4# UBW: 219-225# BMI: 34.83    NUTRITION DIAGNOSIS: Food and Nutrition Related Knowledge Deficit related to cancer and associated treatments as evidenced by no prior need for nutrition related information.   INTERVENTION:  Relayed that nutrition services are wrap around service provided at no charge and encouraged continued communication if experiencing continued weight loss or any nutritional impact symptoms (NIS). Relayed information on two types of fibers and food sources for each. Discussed strategies for constipation. Emailed Nutrition Tip sheet  for  Constipation with contact information provided   MONITORING, EVALUATION, GOAL: weight, PO intake, Nutrition Impact Symptoms, labs Goal is weight maintenance  Next Visit:  PRN at patient or provider request  Carleen Chary, RDN, LDN Registered Dietitian, [facility] Part Time Remote (Usual office hours: Tuesday-Thursday) Cell: [phone]

## 2024-02-09 ENCOUNTER — Other Ambulatory Visit: Payer: Self-pay | Admitting: Oncology

## 2024-02-11 ENCOUNTER — Encounter: Payer: Self-pay | Admitting: Pulmonary Disease

## 2024-02-11 ENCOUNTER — Ambulatory Visit: Admitting: Pulmonary Disease

## 2024-02-11 ENCOUNTER — Ambulatory Visit (INDEPENDENT_AMBULATORY_CARE_PROVIDER_SITE_OTHER)

## 2024-02-11 VITALS — BP 132/74 | Ht 66.0 in | Wt 227.0 lb

## 2024-02-11 DIAGNOSIS — C7802 Secondary malignant neoplasm of left lung: Secondary | ICD-10-CM

## 2024-02-11 DIAGNOSIS — R0600 Dyspnea, unspecified: Secondary | ICD-10-CM | POA: Diagnosis not present

## 2024-02-11 DIAGNOSIS — C7801 Secondary malignant neoplasm of right lung: Secondary | ICD-10-CM

## 2024-02-11 DIAGNOSIS — C259 Malignant neoplasm of pancreas, unspecified: Secondary | ICD-10-CM | POA: Diagnosis not present

## 2024-02-11 DIAGNOSIS — R918 Other nonspecific abnormal finding of lung field: Secondary | ICD-10-CM | POA: Diagnosis not present

## 2024-02-11 DIAGNOSIS — C250 Malignant neoplasm of head of pancreas: Secondary | ICD-10-CM | POA: Diagnosis not present

## 2024-02-11 DIAGNOSIS — C770 Secondary and unspecified malignant neoplasm of lymph nodes of head, face and neck: Secondary | ICD-10-CM | POA: Diagnosis not present

## 2024-02-11 NOTE — Patient Instructions (Signed)
 Will check a chest x-ray today  We will walk you today to see if your oxygen levels are dropping below 88% we can get you qualified for supplemental oxygen  Follow up in 3 months, call sooner if needed

## 2024-02-11 NOTE — Progress Notes (Signed)
 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  Initial OV 12/25/23 Patient was admitted 2/22 to 2/23 for acute pulmonary embolism started on eliquis . CTA PE study notable for multiple pulmonary nodules. He was re-admitted 3/3 for chest pain which was deemed non-cardiac.   Since discharge he complains of brain fog and vision changes. He is accompanied by his wife.  He is concerned about pancreatic issues due to a family history of pancreatic cancer in his mother and pancreatic problems in other relatives. A recent CT scan revealed a spot on his pancreas, and his amylase levels have been consistently elevated around 250, suggesting possible pancreatic involvement based on labs from his [facility] primary provider.  He experiences significant brain fog and difficulty concentrating since starting Eliquis  three weeks ago. While on heparin  in the hospital, he felt alert and energetic, but the brain fog began upon transitioning to Eliquis . He feels lethargic and mentally fatigued, although his muscles seem unaffected. No bleeding in his urine or stool has been noted since starting Eliquis . Additionally, he reports vision changes, including double vision and pressure or pain behind the eyes, which he associates with starting Eliquis . He describes occasional blind spots and swelling around the eyes. He has a history of cataract surgery and typically sees an optometrist, not an ophthalmologist. No specific weakness in his arms or legs is associated with these vision changes.  He has a history of sinus issues, including small sinus passages and  seasonal allergies for which he takes Claritin  and Flonase . His upper sinuses have been clogged since starting Eliquis , affecting his sleep. He recently completed a course of Bactrim  and prednisone for sinus issues but is hesitant to take more antibiotics. No fevers, chills, or specific weakness in his arms or legs. He reports difficulty sleeping, with some nights spent in a recliner due to inability to sleep, not related to nerves or caffeine. His appetite is decent.  He describes a persistent gut ache that he likens to an ulcer, which worsens when lying down. He finds some relief with milk of magnesia and Pepcid, which he has been taking off and on since leaving the hospital. The discomfort began after his first pulmonary embolism.  OV 02/11/24 He experiences significant shortness of breath, which is his main concern. This is accompanied by blurry vision and cognitive difficulties. Resting for about half an hour temporarily improves his symptoms, but walking even 100 feet causes fatigue. His home oxygen levels have never dropped below 95%.  He has a history of pulmonary embolism with only one minor twinge in the affected area since then. There is no swelling in the legs. He uses Flonase  twice a day and sleeps with a CPAP machine but has difficulty sleeping, taking 1.5 to 3 hours to fall asleep even with Ambien . Previously, Xanax helped him fall asleep quickly but only lasted 4-5 hours and left him feeling unwell in the morning. He occasionally uses alcohol to aid sleep, consuming one or two glasses of wine at dinner or bedtime.  Some days he can manage stairs a couple of times, but other  days he feels too worn out to even fix his own cereal. He experiences shortness of breath and mental fuzziness, which limits his activities.  Past Medical History:  Diagnosis Date   Arthritis    CAD (coronary artery disease) 02/26/2018   A. Inf STEMI 6/18: LHC - pLCx 60, dRCA 100, EF 45-50 >> PCI:  DES to RCA // B.  Echo 8/18: EF 60-65, nwm, grade 1 dd, MAC. C. 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   Hyperlipemia    intol to some statins   IBS (irritable bowel syndrome)    Medication intolerance    severe SOB due to Brilinta  >> changed to Plavix    Renal disorder    kidney stones   Seasonal allergies    Sinus congestion    chronic   Sleep apnea    uses a c-pap     Family History  Problem Relation Age of Onset   Skin cancer Mother    Pancreatic cancer Mother    Skin cancer Father      Social History   Socioeconomic History   Marital status: Married    Spouse name: Not on file   Number of children: Not on file   Years of education: Not on file   Highest education level: Not on file  Occupational History   Not on file  Tobacco Use   Smoking status: Never   Smokeless tobacco: Never  Vaping Use   Vaping status: Never Used  Substance and Sexual Activity   Alcohol use: Yes    Comment: a bottle of wine every night   Drug use: No   Sexual activity: Not on file  Other Topics Concern   Not on file  Social History Narrative   Not on file   Social Drivers of Health   Financial Resource Strain: Not on file  Food Insecurity: No Food Insecurity (12/31/2023)   Hunger Vital Sign    Worried About Running Out of Food in the Last Year: Never true    Ran Out of Food in the Last Year: Never true  Transportation Needs: No Transportation Needs (12/31/2023)   PRAPARE - Administrator, Civil Service (Medical): No    Lack of Transportation (Non-Medical): No  Physical Activity: Not on file  Stress: Not on file  Social Connections: Moderately Integrated (12/09/2023)   Social Connection and Isolation Panel [NHANES]    Frequency of Communication with Friends and Family: More than three times a week    Frequency of Social Gatherings with Friends and Family: Once a week    Attends Religious Services: Never    Database administrator or  Organizations: Yes    Attends Engineer, structural: More than 4 times per year    Marital Status: Married  Catering manager Violence: Not At Risk (12/31/2023)   Humiliation, Afraid, Rape, and Kick questionnaire    Fear of Current or Ex-Partner: No    Emotionally Abused: No    Physically Abused: No    Sexually Abused: No     Allergies  Allergen Reactions   Brilinta  [Ticagrelor ] Shortness Of Breath and Other (See Comments)    Dizziness, weakness/Caused hospital admission   Plavix  [Clopidogrel ] Shortness Of Breath and Other (See Comments)    Fatigue, also   Statins Other (See Comments)    Pain in joints, can't move knees   Clotrimazole-Betamethasone Other (See Comments)  Patient cannot recall symptoms   Potassium Citrate Diarrhea and Other (See Comments)    Severe stomach pain    Tagamet [Cimetidine] Other (See Comments)    Gynecomastia    Tizanidine Hcl Other (See Comments)    Pt cannot recall symptoms   Zyrtec [Cetirizine Hcl] Other (See Comments)    Pt cannot recall symptoms   Dicyclomine Anxiety and Other (See Comments)    Interfered with sleep- made nervous   Zofran  [Ondansetron ] Other (See Comments)    Constipation      Outpatient Medications Prior to Visit  Medication Sig Dispense Refill   allopurinol  (ZYLOPRIM ) 300 MG tablet Take 300 mg by mouth daily.     amLODipine  (NORVASC ) 5 MG tablet TAKE 1 TABLET BY MOUTH DAILY 90 tablet 3   aspirin  EC 81 MG tablet Take 81 mg by mouth daily.     docusate sodium  (COLACE) 100 MG capsule Take 100 mg by mouth daily.     enoxaparin  (LOVENOX ) 150 MG/ML injection Inject 1 mL (150 mg total) into the skin daily. 30 mL 5   fluticasone  (FLONASE ) 50 MCG/ACT nasal spray Place 2 sprays into both nostrils See admin instructions. Instill 2 sprays into both nostrils in the morning and evening     HYDROcodone -acetaminophen  (NORCO/VICODIN) 5-325 MG tablet Take 1 tablet by mouth every 6 (six) hours as needed for moderate pain (pain score  4-6). 30 tablet 0   hydrocortisone 1 % ointment Apply 1 Application topically 2 (two) times daily as needed for itching.     ibuprofen  (ADVIL ,MOTRIN ) 200 MG tablet Take 400 mg by mouth every 6 (six) hours as needed for mild pain (pain score 1-3) (lower back pain).     ketoconazole (NIZORAL) 2 % shampoo Apply 1 Application topically See admin instructions. Use as a shampoo once a week     Lactobacillus Rhamnosus, GG, (CULTURELLE) CAPS Take 1 capsule by mouth in the morning.     loratadine  (CLARITIN ) 10 MG tablet Take 10 mg by mouth See admin instructions. Take 10 mg by mouth in the morning and evening     Menthol-Methyl Salicylate (SALONPAS PAIN RELIEF PATCH) PTCH Apply 1 patch topically daily as needed (for pain).     Multiple Vitamin (MULTIVITAMIN WITH MINERALS) TABS Take 1 tablet by mouth daily with breakfast.     pantoprazole  (PROTONIX ) 40 MG tablet Take 1 tablet (40 mg total) by mouth daily. 30 tablet 5   PRESCRIPTION MEDICATION CPAP- At bedtime     prochlorperazine  (COMPAZINE ) 10 MG tablet Take 1 tablet (10 mg total) by mouth every 6 (six) hours as needed. 60 tablet 1   tacrolimus (PROTOPIC) 0.1 % ointment Apply 1 Application topically daily as needed (for rashes- affected areas).     Wheat Dextrin (BENEFIBER) POWD Take 4 g by mouth See admin instructions. Mix 4 grams (1 teaspoonful) into a desired beverage and drink in the morning     zolpidem  (AMBIEN ) 10 MG tablet Take 1 tablet (10 mg total) by mouth at bedtime as needed for sleep. 30 tablet 1   ondansetron  (ZOFRAN ) 8 MG tablet Take 1 tablet (8 mg total) by mouth every 8 (eight) hours as needed for nausea or vomiting. 30 tablet 1   No facility-administered medications prior to visit.    Review of Systems  Constitutional:  Positive for malaise/fatigue. Negative for chills, fever and weight loss.  HENT:  Negative for congestion, sinus pain and sore throat.   Eyes:  Positive for blurred vision.  Respiratory:  Positive for shortness of  breath. Negative for cough, hemoptysis, sputum production and wheezing.   Cardiovascular:  Negative for chest pain, palpitations, orthopnea, claudication and leg swelling.  Gastrointestinal:  Negative for abdominal pain, heartburn, nausea and vomiting.  Genitourinary: Negative.   Musculoskeletal:  Negative for joint pain and myalgias.  Skin:  Negative for rash.  Neurological:  Negative for weakness.  Endo/Heme/Allergies: Negative.   Psychiatric/Behavioral: Negative.      Objective:   Vitals:   02/11/24 1058  BP: 132/74  SpO2: 98%  Weight: 227 lb (103 kg)  Height: 5\' 6"  (1.676 m)    Physical Exam Constitutional:      General: He is not in acute distress.    Appearance: Normal appearance. He is obese.  Eyes:     General: No scleral icterus.    Conjunctiva/sclera: Conjunctivae normal.  Cardiovascular:     Rate and Rhythm: Normal rate and regular rhythm.  Pulmonary:     Breath sounds: No wheezing, rhonchi or rales.  Abdominal:     Tenderness: There is no abdominal tenderness.  Musculoskeletal:     Right lower leg: No edema.     Left lower leg: No edema.  Skin:    General: Skin is warm and dry.    CBC    Component Value Date/Time   WBC 5.7 01/30/2024 1110   WBC 8.5 12/10/2023 0527   RBC 4.09 (L) 01/30/2024 1110   HGB 13.2 01/30/2024 1110   HCT 38.6 (L) 01/30/2024 1110   PLT 424 (H) 01/30/2024 1110   MCV 94.4 01/30/2024 1110   MCH 32.3 01/30/2024 1110   MCHC 34.2 01/30/2024 1110   RDW 13.3 01/30/2024 1110   LYMPHSABS 1.2 01/30/2024 1110   MONOABS 0.7 01/30/2024 1110   EOSABS 0.2 01/30/2024 1110   BASOSABS 0.1 01/30/2024 1110      Latest Ref Rng & Units 01/30/2024   11:10 AM 01/08/2024    1:50 PM 12/10/2023    5:27 AM  BMP  Glucose 70 - 99 mg/dL 161  096  045   BUN 8 - 23 mg/dL 14  15  15    Creatinine 0.61 - 1.24 mg/dL 4.09  8.11  9.14   Sodium 135 - 145 mmol/L 136  138  133   Potassium 3.5 - 5.1 mmol/L 3.9  4.5  3.9   Chloride 98 - 111 mmol/L 105  104  103    CO2 22 - 32 mmol/L 22  26  20    Calcium  8.9 - 10.3 mg/dL 78.2  9.8  9.0    Chest imaging: CTA Chest 12/09/23 1. No evidence acute pulmonary embolism. 2. Bilateral pulmonary nodules are concerning for metastatic disease. Pulmonary infection would be secondary consideration. 3. Fullness within the head of the pancreas is indeterminate. Relatively stable over comparison exams.  CTA Chest 11/29/23 Mediastinum/Nodes: No mediastinal, hilar, or axillary lymphadenopathy. Calcified lymph nodes are noted at the mediastinum and right hilum. The thyroid  gland, trachea, and esophagus are within normal limits. There is a small hiatal hernia.   Lungs/Pleura: No consolidation, effusion or pneumothorax is seen. Scattered pulmonary nodules are present bilaterally measuring up to 1.4 cm in the right lower lobe.  PFT:    Latest Ref Rng & Units 03/18/2018    6:11 PM  PFT Results  FVC-Pre L 4.57   FVC-Predicted Pre % 121   FVC-Post L 4.51   FVC-Predicted Post % 120   Pre FEV1/FVC % % 83   Post FEV1/FCV % % 82  FEV1-Pre L 3.78   FEV1-Predicted Pre % 139   FEV1-Post L 3.71   DLCO uncorrected ml/min/mmHg 22.35   DLCO UNC% % 78   DLCO corrected ml/min/mmHg 21.87   DLCO COR %Predicted % 77   DLVA Predicted % 85   TLC L 6.58   TLC % Predicted % 102   RV % Predicted % 81     Labs:  Path:  Echo 11/30/23: LV EF 60-65%. 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  Metastatic Pancreatic Cancer to the Lung - no pleural effusion noted on chest x-ray today - he is undergoing palliative  chemotherapy  Dyspnea - in setting of cancer  - consider inhaler trial in future, but not felt to be much benefit at this time - no desaturations on simple walk today, does not qualify for oxygen  DVT/PE - Continue Eliquis  therapy.  Follow up in 3 months  Duaine German, MD [facility] Office: [phone]    Current Outpatient Medications:    allopurinol  (ZYLOPRIM ) 300 MG tablet, Take 300 mg by mouth daily., Disp: , Rfl:    amLODipine  (NORVASC ) 5 MG tablet, TAKE 1 TABLET BY MOUTH DAILY, Disp: 90 tablet, Rfl: 3   aspirin  EC 81 MG tablet, Take 81 mg by mouth daily., Disp: , Rfl:    docusate sodium  (COLACE) 100 MG capsule, Take 100 mg by mouth daily., Disp: , Rfl:    enoxaparin  (LOVENOX ) 150 MG/ML injection, Inject 1 mL (150 mg total) into the skin daily., Disp: 30 mL, Rfl: 5   fluticasone  (FLONASE ) 50 MCG/ACT nasal spray, Place 2 sprays into both nostrils See admin instructions. Instill 2 sprays into both nostrils in the morning and evening, Disp: , Rfl:    HYDROcodone -acetaminophen  (NORCO/VICODIN) 5-325 MG tablet, Take 1 tablet by mouth every 6 (six) hours as needed for moderate pain (pain score 4-6)., Disp: 30 tablet, Rfl: 0   hydrocortisone 1 % ointment, Apply 1 Application topically 2 (two) times daily as needed for itching., Disp: , Rfl:    ibuprofen  (ADVIL ,MOTRIN ) 200 MG tablet, Take 400 mg by mouth every 6 (six) hours as needed for mild pain (pain score 1-3) (lower back pain)., Disp: , Rfl:    ketoconazole (NIZORAL) 2 % shampoo, Apply 1 Application topically See admin instructions. Use as a shampoo once a week, Disp: , Rfl:    Lactobacillus Rhamnosus, GG, (CULTURELLE) CAPS, Take 1 capsule by mouth in the morning., Disp: , Rfl:    loratadine  (CLARITIN ) 10 MG tablet, Take 10 mg by mouth See admin instructions. Take 10 mg by mouth in the morning and evening, Disp: , Rfl:    Menthol-Methyl Salicylate (SALONPAS PAIN RELIEF PATCH) PTCH, Apply 1 patch topically  daily as needed (for pain)., Disp: , Rfl:    Multiple Vitamin (MULTIVITAMIN WITH MINERALS) TABS, Take 1 tablet by mouth daily with breakfast., Disp: , Rfl:    pantoprazole  (PROTONIX ) 40 MG tablet, Take 1 tablet (40 mg total) by mouth daily., Disp: 30 tablet, Rfl: 5   PRESCRIPTION MEDICATION, CPAP- At bedtime, Disp: , Rfl:  prochlorperazine  (COMPAZINE ) 10 MG tablet, Take 1 tablet (10 mg total) by mouth every 6 (six) hours as needed., Disp: 60 tablet, Rfl: 1   tacrolimus (PROTOPIC) 0.1 % ointment, Apply 1 Application topically daily as needed (for rashes- affected areas)., Disp: , Rfl:    Wheat Dextrin (BENEFIBER) POWD, Take 4 g by mouth See admin instructions. Mix 4 grams (1 teaspoonful) into a desired beverage and drink in the morning, Disp: , Rfl:    zolpidem  (AMBIEN ) 10 MG tablet, Take 1 tablet (10 mg total) by mouth at bedtime as needed for sleep., Disp: 30 tablet, Rfl: 1   ondansetron  (ZOFRAN ) 8 MG tablet, Take 1 tablet (8 mg total) by mouth every 8 (eight) hours as needed for nausea or vomiting., Disp: 30 tablet, Rfl: 1

## 2024-02-12 ENCOUNTER — Other Ambulatory Visit: Payer: Self-pay

## 2024-02-13 ENCOUNTER — Inpatient Hospital Stay: Attending: Oncology

## 2024-02-13 ENCOUNTER — Inpatient Hospital Stay

## 2024-02-13 ENCOUNTER — Inpatient Hospital Stay: Admitting: Oncology

## 2024-02-13 VITALS — BP 137/70 | HR 66 | Temp 98.1°F

## 2024-02-13 VITALS — BP 129/68 | HR 80 | Temp 98.1°F | Resp 18 | Ht 66.0 in | Wt 228.7 lb

## 2024-02-13 DIAGNOSIS — C250 Malignant neoplasm of head of pancreas: Secondary | ICD-10-CM

## 2024-02-13 DIAGNOSIS — Z95828 Presence of other vascular implants and grafts: Secondary | ICD-10-CM

## 2024-02-13 DIAGNOSIS — Z5111 Encounter for antineoplastic chemotherapy: Secondary | ICD-10-CM | POA: Diagnosis not present

## 2024-02-13 DIAGNOSIS — C7800 Secondary malignant neoplasm of unspecified lung: Secondary | ICD-10-CM | POA: Insufficient documentation

## 2024-02-13 DIAGNOSIS — C770 Secondary and unspecified malignant neoplasm of lymph nodes of head, face and neck: Secondary | ICD-10-CM | POA: Insufficient documentation

## 2024-02-13 LAB — CMP (CANCER CENTER ONLY)
ALT: 60 U/L — ABNORMAL HIGH (ref 0–44)
AST: 32 U/L (ref 15–41)
Albumin: 3.7 g/dL (ref 3.5–5.0)
Alkaline Phosphatase: 60 U/L (ref 38–126)
Anion gap: 13 (ref 5–15)
BUN: 12 mg/dL (ref 8–23)
CO2: 20 mmol/L — ABNORMAL LOW (ref 22–32)
Calcium: 9.5 mg/dL (ref 8.9–10.3)
Chloride: 105 mmol/L (ref 98–111)
Creatinine: 0.92 mg/dL (ref 0.61–1.24)
GFR, Estimated: >60 mL/min (ref >60)
Glucose, Bld: 197 mg/dL — ABNORMAL HIGH (ref 70–99)
Potassium: 3.8 mmol/L (ref 3.5–5.1)
Sodium: 138 mmol/L (ref 135–145)
Total Bilirubin: <0.2 mg/dL (ref 0.0–1.2)
Total Protein: 6.1 g/dL — ABNORMAL LOW (ref 6.5–8.1)

## 2024-02-13 LAB — CBC WITH DIFFERENTIAL (CANCER CENTER ONLY)
Abs Immature Granulocytes: 0.12 10*3/uL — ABNORMAL HIGH (ref 0.00–0.07)
Basophils Absolute: 0.1 10*3/uL (ref 0.0–0.1)
Basophils Relative: 1 %
Eosinophils Absolute: 0.3 10*3/uL (ref 0.0–0.5)
Eosinophils Relative: 4 %
HCT: 37.2 % — ABNORMAL LOW (ref 39.0–52.0)
Hemoglobin: 12.5 g/dL — ABNORMAL LOW (ref 13.0–17.0)
Immature Granulocytes: 2 %
Lymphocytes Relative: 24 %
Lymphs Abs: 1.5 10*3/uL (ref 0.7–4.0)
MCH: 32.3 pg (ref 26.0–34.0)
MCHC: 33.6 g/dL (ref 30.0–36.0)
MCV: 96.1 fL (ref 80.0–100.0)
Monocytes Absolute: 0.8 10*3/uL (ref 0.1–1.0)
Monocytes Relative: 13 %
Neutro Abs: 3.3 10*3/uL (ref 1.7–7.7)
Neutrophils Relative %: 56 %
Platelet Count: 197 10*3/uL (ref 150–400)
RBC: 3.87 MIL/uL — ABNORMAL LOW (ref 4.22–5.81)
RDW: 14.5 % (ref 11.5–15.5)
WBC Count: 6 10*3/uL (ref 4.0–10.5)
nRBC: 0 % (ref 0.0–0.2)

## 2024-02-13 MED ORDER — HEPARIN SOD (PORK) LOCK FLUSH 100 UNIT/ML IV SOLN
500.0000 [IU] | Freq: Once | INTRAVENOUS | Status: DC | PRN
Start: 2024-02-13 — End: 2024-02-13

## 2024-02-13 MED ORDER — PACLITAXEL PROTEIN-BOUND CHEMO INJECTION 100 MG
100.0000 mg/m2 | Freq: Once | INTRAVENOUS | Status: AC
  Administered 2024-02-13: 225 mg via INTRAVENOUS
  Filled 2024-02-13: qty 45

## 2024-02-13 MED ORDER — SODIUM CHLORIDE 0.9 % IV SOLN
2200.0000 mg | Freq: Once | INTRAVENOUS | Status: AC
  Administered 2024-02-13: 2200 mg via INTRAVENOUS
  Filled 2024-02-13: qty 52.57

## 2024-02-13 MED ORDER — SODIUM CHLORIDE 0.9% FLUSH: 10.0000 mL | INTRAVENOUS | Status: DC | PRN

## 2024-02-13 MED ORDER — SODIUM CHLORIDE 0.9 % IV SOLN
INTRAVENOUS | Status: DC
Start: 2024-02-13 — End: 2024-02-13

## 2024-02-13 MED ORDER — SODIUM CHLORIDE 0.9% FLUSH
10.0000 mL | Freq: Once | INTRAVENOUS | Status: AC
  Administered 2024-02-13: 10 mL via INTRAVENOUS

## 2024-02-13 NOTE — Patient Instructions (Signed)
 [facility] - A DEPT OF [place]  Discharge Instructions: Thank you for choosing [facility] to provide your oncology and hematology care.   If you have a lab appointment with the Cancer Center, please go directly to the Cancer Center and check in at the registration area.   Wear comfortable clothing and clothing appropriate for easy access to any Portacath or PICC line.   We strive to give you quality time with your provider. You may need to reschedule your appointment if you arrive late (15 or more minutes).  Arriving late affects you and other patients whose appointments are after yours.  Also, if you miss three or more appointments without notifying the office, you may be dismissed from the clinic at the provider's discretion.      For prescription refill requests, have your pharmacy contact our office and allow 72 hours for refills to be completed.    Today you received the following chemotherapy and/or immunotherapy agents Abraxane  and Gemzar       To help prevent nausea and vomiting after your treatment, we encourage you to take your nausea medication as directed.  BELOW ARE SYMPTOMS THAT SHOULD BE REPORTED IMMEDIATELY: *FEVER GREATER THAN 100.4 F (38 C) OR HIGHER *CHILLS OR SWEATING *NAUSEA AND VOMITING THAT IS NOT CONTROLLED WITH YOUR NAUSEA MEDICATION *UNUSUAL SHORTNESS OF BREATH *UNUSUAL BRUISING OR BLEEDING *URINARY PROBLEMS (pain or burning when urinating, or frequent urination) *BOWEL PROBLEMS (unusual diarrhea, constipation, pain near the anus) TENDERNESS IN MOUTH AND THROAT WITH OR WITHOUT PRESENCE OF ULCERS (sore throat, sores in mouth, or a toothache) UNUSUAL RASH, SWELLING OR PAIN  UNUSUAL VAGINAL DISCHARGE OR ITCHING   Items with * indicate a potential emergency and should be followed up as soon as possible or go to the Emergency Department if any problems should occur.  Please show the CHEMOTHERAPY ALERT CARD or  IMMUNOTHERAPY ALERT CARD at check-in to the Emergency Department and triage nurse.  Should you have questions after your visit or need to cancel or reschedule your appointment, please contact [facility]  Dept: [phone]  and follow the prompts.  Office hours are 8:00 a.m. to 4:30 p.m. Monday - Friday. Please note that voicemails left after 4:00 p.m. may not be returned until the following business day.  We are closed weekends and major holidays. You have access to a nurse at all times for urgent questions. Please call the main number to the clinic Dept: [phone] and follow the prompts.   For any non-urgent questions, you may also contact your provider using MyChart. We now offer e-Visits for anyone 31 and older to request care online for non-urgent symptoms. For details visit [email or web address].   Also download the MyChart app! Go to the app store, search "MyChart", open the app, select , and log in with your MyChart username and password.

## 2024-02-13 NOTE — Progress Notes (Signed)
 Patient seen by Dr. Thornton Papas today  Vitals are within treatment parameters:Yes   Labs are within treatment parameters: Yes   Treatment plan has been signed: Yes   Per physician team, Patient is ready for treatment and there are NO modifications to the treatment plan.

## 2024-02-13 NOTE — Progress Notes (Deleted)
 [facility] OFFICE PROGRESS NOTE   Diagnosis:   INTERVAL HISTORY:   ***  Objective:  Vital signs in last 24 hours:  Blood pressure 129/68, pulse 80, temperature 98.1 F (36.7 C), temperature source Temporal, resp. rate 18, height 5\' 6"  (1.676 m), weight 228 lb 11.2 oz (103.7 kg), SpO2 100%.    HEENT: *** Lymphatics: *** Resp: *** Cardio: *** GI: *** Vascular: *** Neuro:***  Skin:***   Portacath/PICC-without erythema  Lab Results:  Lab Results  Component Value Date   WBC 6.0 02/13/2024   HGB 12.5 (L) 02/13/2024   HCT 37.2 (L) 02/13/2024   MCV 96.1 02/13/2024   PLT 197 02/13/2024   NEUTROABS 3.3 02/13/2024    CMP  Lab Results  Component Value Date   NA 138 02/13/2024   K 3.8 02/13/2024   CL 105 02/13/2024   CO2 20 (L) 02/13/2024   GLUCOSE 197 (H) 02/13/2024   BUN 12 02/13/2024   CREATININE 0.92 02/13/2024   CALCIUM  9.5 02/13/2024   PROT 6.1 (L) 02/13/2024   ALBUMIN  3.7 02/13/2024   AST 32 02/13/2024   ALT 60 (H) 02/13/2024   ALKPHOS 60 02/13/2024   BILITOT <0.2 02/13/2024   GFRNONAA >60 02/13/2024   GFRAA >60 11/08/2018    Lab Results  Component Value Date   ZOX096 23,503 (H) 01/08/2024    Lab Results  Component Value Date   INR 1.1 11/30/2023   LABPROT 14.5 11/30/2023    Imaging:  DG Chest 2 View Result Date: 02/13/2024 CLINICAL DATA:  Metastatic pancreatic cancer.  Dyspnea. EXAM: CHEST - 2 VIEW COMPARISON:  02/08/2024, chest radiograph and chest CT. FINDINGS: Cardiac silhouette is normal in size and configuration. No mediastinal or hilar masses. No evidence of adenopathy. Right anterior chest wall, internal jugular Port-A-Cath, tip in the mid superior vena cava, new since the prior chest radiograph. Small, ill-defined bilateral lung nodules are noted. No evidence of pneumonia or pulmonary edema. No pleural effusion or pneumothorax. Skeletal structures are intact. IMPRESSION: 1. No acute cardiopulmonary disease. 2. Ill-defined  bilateral lung nodules consistent with metastatic disease, better appreciated on the CT from 12/09/2023, not convincingly changed from the portable chest radiograph from 12/09/2023. Electronically Signed   By: Amanda Jungling M.D.   On: 02/13/2024 09:19    Medications: I have reviewed the patient's current medications.   Assessment/Plan: Pancreas cancer 11/19/2022: 2.9 cm thin-walled cystic lesion in the pancreas uncinate 07/28/2023: CT abdomen/pelvis: Stable 2.8 cm cystic area in the uncinate process 11/29/2023: CT chest-subsegmental and segmental pulmonary emboli in the left upper lobe, dilation of the right ventricle, multiple pulmonary nodules 12/26/2023: CA 19-9- 04,540 12/27/2023: PET-5 cm hypermetabolic pancreas head/uncinate mass, numerous hypermetabolic pulmonary nodules, hypermetabolic left supraclavicular and retroperitoneal lymph nodes 01/06/2024: Left supraclavicular lymph node biopsy-metastatic moderately differentiated adenocarcinoma, 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  CVAs-visual disturbance and altered mental status 12/30/2023 MRI brain-many foci of restricted diffusion in the cerebellum, left frontal, left basal ganglia, and parietal lobes compatible with acute/subacute infarcts, consider embolic etiology 4.  CAD, status post acute inferior MI  June 2018, RCA stent 5.  Kidney stones 6.   Gout 7.  Family history of pancreas cancer 8.  History of basal cell carcinoma 9.  Negative genetic testing with VUS in EGFR.  First-degree relatives are candidates for considering pancreatic cancer screening based on family history.  Discussed with patient and wife 01/30/2024.    Disposition: ***  [name], MD  02/13/2024  10:41 AM

## 2024-02-13 NOTE — Progress Notes (Signed)
 [facility] OFFICE PROGRESS NOTE   Diagnosis: Pancreas cancer  INTERVAL HISTORY:   Timothy Wilcox returns as scheduled.  He completed another cycle of gemcitabine /Abraxane  on 01/30/2024.  No rash, fever, or neuropathy symptoms.  He has developed alopecia.  He reports nausea beginning a week following chemotherapy.  No abdominal pain.  He believes the abdominal pain he had at diagnosis was related to constipation.  He is having bowel movements since beginning Colace.  He continues Lovenox  anticoagulation.  No new stroke symptoms.  He saw Dr. Diania Fortes earlier this week.  Chest x-ray revealed no pleural effusion and small lung nodules. He reports insomnia despite Ambien .  He plans to begin an exercise program in the home.  Objective:  Vital signs in last 24 hours:  Blood pressure 129/68, pulse 80, temperature 98.1 F (36.7 C), temperature source Temporal, resp. rate 18, height 5\' 6"  (1.676 m), weight 228 lb 11.2 oz (103.7 kg), SpO2 100%.    HEENT: Partial alopecia, no thrush or ulcers Resp: Lungs clear bilaterally Cardio: Regular rate and rhythm GI: No mass, nontender, no hepatosplenomegaly, no apparent ascites Vascular: No leg edema    Portacath/PICC-without erythema  Lab Results:  Lab Results  Component Value Date   WBC 6.0 02/13/2024   HGB 12.5 (L) 02/13/2024   HCT 37.2 (L) 02/13/2024   MCV 96.1 02/13/2024   PLT 197 02/13/2024   NEUTROABS 3.3 02/13/2024    CMP  Lab Results  Component Value Date   NA 138 02/13/2024   K 3.8 02/13/2024   CL 105 02/13/2024   CO2 20 (L) 02/13/2024   GLUCOSE 197 (H) 02/13/2024   BUN 12 02/13/2024   CREATININE 0.92 02/13/2024   CALCIUM  9.5 02/13/2024   PROT 6.1 (L) 02/13/2024   ALBUMIN  3.7 02/13/2024   AST 32 02/13/2024   ALT 60 (H) 02/13/2024   ALKPHOS 60 02/13/2024   BILITOT <0.2 02/13/2024   GFRNONAA >60 02/13/2024   GFRAA >60 11/08/2018    Lab Results  Component Value Date   ZOX096 23,503 (H) 01/08/2024      Imaging:  DG Chest 2 View Result Date: 02/13/2024 CLINICAL DATA:  Metastatic pancreatic cancer.  Dyspnea. EXAM: CHEST - 2 VIEW COMPARISON:  02/08/2024, chest radiograph and chest CT. FINDINGS: Cardiac silhouette is normal in size and configuration. No mediastinal or hilar masses. No evidence of adenopathy. Right anterior chest wall, internal jugular Port-A-Cath, tip in the mid superior vena cava, new since the prior chest radiograph. Small, ill-defined bilateral lung nodules are noted. No evidence of pneumonia or pulmonary edema. No pleural effusion or pneumothorax. Skeletal structures are intact. IMPRESSION: 1. No acute cardiopulmonary disease. 2. Ill-defined bilateral lung nodules consistent with metastatic disease, better appreciated on the CT from 12/09/2023, not convincingly changed from the portable chest radiograph from 12/09/2023. Electronically Signed   By: Amanda Jungling M.D.   On: 02/13/2024 09:19    Medications: I have reviewed the patient's current medications.   Assessment/Plan: Pancreas cancer 11/19/2022: 2.9 cm thin-walled cystic lesion in the pancreas uncinate 07/28/2023: CT abdomen/pelvis: Stable 2.8 cm cystic area in the uncinate process 11/29/2023: CT chest-subsegmental and segmental pulmonary emboli in the left upper lobe, dilation of the right ventricle, multiple pulmonary nodules 12/26/2023: CA 19-9- 04,540 12/27/2023: PET-5 cm hypermetabolic pancreas head/uncinate mass, numerous hypermetabolic pulmonary nodules, hypermetabolic left supraclavicular and retroperitoneal lymph nodes 01/06/2024: Left supraclavicular lymph node biopsy-metastatic moderately differentiated adenocarcinoma, 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, upper GI, breast, and salivary gland carcinoma Foundation 1 01/06/2024-HRDsig negative,  microsatellite stable, tumor mutation burden 2, K-ras G12R Cycle 1 gemcitabine /Abraxane  01/16/2024 Cycle 2 gemcitabine /Abraxane  01/30/2024 Cycle 3  gemcitabine /Abraxane  02/13/2024 Pulmonary embolism 11/29/2023 Small segmental and subsegmental pulmonary emboli in the left upper lobe, dilation of right ventricle Admitted for heparin  anticoagulation followed by apixaban -discharged 12/01/2023 Lower extremity Dopplers 11/30/2023: Acute left posterior tibial, peroneal, and TP trunk DVTs, acute right peroneal DVT Lovenox  12/31/2023   3.  CVAs-visual disturbance and altered mental status 12/30/2023 MRI brain-many foci of restricted diffusion in the cerebellum, left frontal, left basal ganglia, and parietal lobes compatible with acute/subacute infarcts, consider embolic etiology 4.  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  Kidney stones 6.  Gout 7.  Family history of pancreas cancer 8.  History of basal cell carcinoma 9.  Negative genetic testing with VUS in EGFR.  First-degree relatives are candidates for considering pancreatic cancer screening based on family history.  Discussed with patient and wife 01/30/2024.   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  He has completed 2 treatments with gemcitabine /Abraxane .  He has tolerated the treatment well.  His overall performance status appears partially improved.  He will complete cycle 3 today.  We will check the CA 19-9 in 2 weeks.Aaron Aas He will be referred for a restaging CT evaluation after 5 or 6 cycles of gemcitabine /Abraxane .  He continues Lovenox  anticoagulation for treatment of pulmonary emboli and multivessel distribution CVAs.  I suspect the exertional dyspnea is related to the history of pulmonary embolism, metastatic disease involving the lungs, and underlying cardiovascular disease.  He plans to begin an exercise program at home.  Mr. Wacek will return for an office visit in 2 weeks.  [name], MD  02/13/2024  12:20 PM

## 2024-02-15 ENCOUNTER — Other Ambulatory Visit: Payer: Self-pay

## 2024-02-19 ENCOUNTER — Encounter: Payer: Self-pay | Admitting: Oncology

## 2024-02-20 ENCOUNTER — Encounter: Payer: Self-pay | Admitting: *Deleted

## 2024-02-20 NOTE — Progress Notes (Signed)
 Faxed application to [employer] for Lovenox  assistance. Timothy Wilcox is aware.

## 2024-02-24 ENCOUNTER — Encounter (HOSPITAL_BASED_OUTPATIENT_CLINIC_OR_DEPARTMENT_OTHER): Payer: Self-pay

## 2024-02-24 ENCOUNTER — Ambulatory Visit (HOSPITAL_BASED_OUTPATIENT_CLINIC_OR_DEPARTMENT_OTHER): Admitting: Student

## 2024-02-24 ENCOUNTER — Other Ambulatory Visit: Payer: Self-pay | Admitting: Oncology

## 2024-02-24 ENCOUNTER — Telehealth (HOSPITAL_BASED_OUTPATIENT_CLINIC_OR_DEPARTMENT_OTHER): Payer: Self-pay

## 2024-02-24 DIAGNOSIS — M7918 Myalgia, other site: Secondary | ICD-10-CM | POA: Diagnosis not present

## 2024-02-24 NOTE — Telephone Encounter (Signed)
 Patient called and wanted to be seen for a LB inj. I advised the pt that we dont do that In the clinic but we can see him a refer him to Dr. Daisey Dryer. He stated he would cb to [facility] and see them so that he doesn't have to make two trips.

## 2024-02-25 ENCOUNTER — Other Ambulatory Visit: Payer: Self-pay

## 2024-02-26 ENCOUNTER — Encounter: Payer: Self-pay | Admitting: *Deleted

## 2024-02-26 DIAGNOSIS — E782 Mixed hyperlipidemia: Secondary | ICD-10-CM | POA: Diagnosis not present

## 2024-02-26 DIAGNOSIS — I700 Atherosclerosis of aorta: Secondary | ICD-10-CM | POA: Diagnosis not present

## 2024-02-26 DIAGNOSIS — M109 Gout, unspecified: Secondary | ICD-10-CM | POA: Diagnosis not present

## 2024-02-26 DIAGNOSIS — I10 Essential (primary) hypertension: Secondary | ICD-10-CM | POA: Diagnosis not present

## 2024-02-26 DIAGNOSIS — C799 Secondary malignant neoplasm of unspecified site: Secondary | ICD-10-CM | POA: Diagnosis not present

## 2024-02-26 DIAGNOSIS — R7303 Prediabetes: Secondary | ICD-10-CM | POA: Diagnosis not present

## 2024-02-26 DIAGNOSIS — I2510 Atherosclerotic heart disease of native coronary artery without angina pectoris: Secondary | ICD-10-CM | POA: Diagnosis not present

## 2024-02-26 NOTE — Progress Notes (Signed)
 Received 30 day supply of Lovenox  150 mg from [employer]. Will give to patient at appointment tomorrow.

## 2024-02-27 ENCOUNTER — Inpatient Hospital Stay: Admitting: Nurse Practitioner

## 2024-02-27 ENCOUNTER — Inpatient Hospital Stay

## 2024-02-27 ENCOUNTER — Encounter: Payer: Self-pay | Admitting: Nurse Practitioner

## 2024-02-27 VITALS — BP 135/76 | HR 72 | Temp 98.1°F | Resp 18

## 2024-02-27 VITALS — BP 135/70 | HR 78 | Temp 98.1°F | Resp 18 | Ht 66.0 in | Wt 227.2 lb

## 2024-02-27 DIAGNOSIS — C250 Malignant neoplasm of head of pancreas: Secondary | ICD-10-CM

## 2024-02-27 DIAGNOSIS — C7800 Secondary malignant neoplasm of unspecified lung: Secondary | ICD-10-CM | POA: Diagnosis not present

## 2024-02-27 DIAGNOSIS — Z5111 Encounter for antineoplastic chemotherapy: Secondary | ICD-10-CM | POA: Diagnosis not present

## 2024-02-27 DIAGNOSIS — C770 Secondary and unspecified malignant neoplasm of lymph nodes of head, face and neck: Secondary | ICD-10-CM | POA: Diagnosis not present

## 2024-02-27 DIAGNOSIS — Z95828 Presence of other vascular implants and grafts: Secondary | ICD-10-CM

## 2024-02-27 LAB — CBC WITH DIFFERENTIAL (CANCER CENTER ONLY)
Abs Immature Granulocytes: 0.27 10*3/uL — ABNORMAL HIGH (ref 0.00–0.07)
Basophils Absolute: 0.1 10*3/uL (ref 0.0–0.1)
Basophils Relative: 1 %
Eosinophils Absolute: 0.1 10*3/uL (ref 0.0–0.5)
Eosinophils Relative: 1 %
HCT: 39.7 % (ref 39.0–52.0)
Hemoglobin: 13.5 g/dL (ref 13.0–17.0)
Immature Granulocytes: 3 %
Lymphocytes Relative: 17 %
Lymphs Abs: 1.7 10*3/uL (ref 0.7–4.0)
MCH: 32.7 pg (ref 26.0–34.0)
MCHC: 34 g/dL (ref 30.0–36.0)
MCV: 96.1 fL (ref 80.0–100.0)
Monocytes Absolute: 1.1 10*3/uL — ABNORMAL HIGH (ref 0.1–1.0)
Monocytes Relative: 11 %
Neutro Abs: 6.4 10*3/uL (ref 1.7–7.7)
Neutrophils Relative %: 67 %
Platelet Count: 308 10*3/uL (ref 150–400)
RBC: 4.13 MIL/uL — ABNORMAL LOW (ref 4.22–5.81)
RDW: 15.6 % — ABNORMAL HIGH (ref 11.5–15.5)
WBC Count: 9.7 10*3/uL (ref 4.0–10.5)
nRBC: 0 % (ref 0.0–0.2)

## 2024-02-27 LAB — CMP (CANCER CENTER ONLY)
ALT: 43 U/L (ref 0–44)
AST: 29 U/L (ref 15–41)
Albumin: 3.9 g/dL (ref 3.5–5.0)
Alkaline Phosphatase: 61 U/L (ref 38–126)
Anion gap: 14 (ref 5–15)
BUN: 17 mg/dL (ref 8–23)
CO2: 21 mmol/L — ABNORMAL LOW (ref 22–32)
Calcium: 10.2 mg/dL (ref 8.9–10.3)
Chloride: 103 mmol/L (ref 98–111)
Creatinine: 0.9 mg/dL (ref 0.61–1.24)
GFR, Estimated: >60 mL/min (ref >60)
Glucose, Bld: 147 mg/dL — ABNORMAL HIGH (ref 70–99)
Potassium: 4.1 mmol/L (ref 3.5–5.1)
Sodium: 138 mmol/L (ref 135–145)
Total Bilirubin: <0.2 mg/dL (ref 0.0–1.2)
Total Protein: 6.6 g/dL (ref 6.5–8.1)

## 2024-02-27 MED ORDER — SODIUM CHLORIDE 0.9% FLUSH
10.0000 mL | INTRAVENOUS | Status: DC | PRN
  Administered 2024-02-27: 10 mL via INTRAVENOUS

## 2024-02-27 MED ORDER — SODIUM CHLORIDE 0.9 % IV SOLN: INTRAVENOUS | Status: DC

## 2024-02-27 MED ORDER — SODIUM CHLORIDE 0.9 % IV SOLN
2200.0000 mg | Freq: Once | INTRAVENOUS | Status: AC
  Administered 2024-02-27: 2200 mg via INTRAVENOUS
  Filled 2024-02-27: qty 52.57

## 2024-02-27 MED ORDER — PACLITAXEL PROTEIN-BOUND CHEMO INJECTION 100 MG
100.0000 mg/m2 | Freq: Once | INTRAVENOUS | Status: AC
  Administered 2024-02-27: 225 mg via INTRAVENOUS
  Filled 2024-02-27: qty 45

## 2024-02-27 MED ORDER — SODIUM CHLORIDE 0.9% FLUSH
10.0000 mL | INTRAVENOUS | Status: DC | PRN
Start: 2024-02-27 — End: 2024-02-27
  Administered 2024-02-27: 10 mL

## 2024-02-27 MED ORDER — HEPARIN SOD (PORK) LOCK FLUSH 100 UNIT/ML IV SOLN
500.0000 [IU] | Freq: Once | INTRAVENOUS | Status: AC | PRN
Start: 2024-02-27 — End: 2024-02-27
  Administered 2024-02-27: 500 [IU]

## 2024-02-27 MED ORDER — PROCHLORPERAZINE MALEATE 10 MG PO TABS: 10.0000 mg | ORAL_TABLET | Freq: Once | ORAL | Status: DC

## 2024-02-27 NOTE — Progress Notes (Signed)
 Patient seen by Lonna Cobb NP today  Vitals are within treatment parameters:Yes   Labs are within treatment parameters: Yes   Treatment plan has been signed: Yes   Per physician team, Patient is ready for treatment and there are NO modifications to the treatment plan.

## 2024-02-27 NOTE — Progress Notes (Signed)
  [facility] OFFICE PROGRESS NOTE   Diagnosis: Pancreas cancer  INTERVAL HISTORY:   [name] returns as scheduled.  He completed another cycle of gemcitabine /Abraxane  02/13/2024.  Around days 2 and 3 he notices more shortness of breath, mild nausea and weakness.  No diarrhea.  Constipation resolved with a stool softener.  No mouth sores.  No neuropathy symptoms.  No cough or fever.  He has exertional dyspnea.  Objective:  Vital signs in last 24 hours:  Blood pressure 135/70, pulse 78, temperature 98.1 F (36.7 C), temperature source Temporal, resp. rate 18, height 5\' 6"  (1.676 m), weight 227 lb 3.2 oz (103.1 kg), SpO2 98%.    HEENT: No thrush or ulcers. Resp: Lungs clear bilaterally. Cardio: Regular rate and rhythm. GI: Nontender.  No hepatosplenomegaly.  No apparent ascites. Vascular: Trace bilateral ankle edema. Port-A-Cath without erythema.  Lab Results:  Lab Results  Component Value Date   WBC 6.0 02/13/2024   HGB 12.5 (L) 02/13/2024   HCT 37.2 (L) 02/13/2024   MCV 96.1 02/13/2024   PLT 197 02/13/2024   NEUTROABS 3.3 02/13/2024    Imaging:  No results found.  Medications: I have reviewed the patient's current medications.  Assessment/Plan: Pancreas cancer 11/19/2022: 2.9 cm thin-walled cystic lesion in the pancreas uncinate 07/28/2023: CT abdomen/pelvis: Stable 2.8 cm cystic area in the uncinate process 11/29/2023: CT chest-subsegmental and segmental pulmonary emboli in the left upper lobe, dilation of the right ventricle, multiple pulmonary nodules 12/26/2023: CA 19-9- 66,286 12/27/2023: PET-5 cm hypermetabolic pancreas head/uncinate mass, numerous hypermetabolic pulmonary nodules, hypermetabolic left supraclavicular and retroperitoneal lymph nodes 01/06/2024: Left supraclavicular lymph node biopsy-metastatic moderately differentiated adenocarcinoma, 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, upper GI, breast, and salivary gland  carcinoma Foundation 1 01/06/2024-HRDsig negative, microsatellite stable, tumor mutation burden 2, K-ras G12R Cycle 1 gemcitabine /Abraxane  01/16/2024 Cycle 2 gemcitabine /Abraxane  01/30/2024 Cycle 3 gemcitabine /Abraxane  02/13/2024 Cycle 4 gemcitabine /Abraxane  02/27/2024 Pulmonary embolism 11/29/2023 Small segmental and subsegmental pulmonary emboli in the left upper lobe, dilation of right ventricle Admitted for heparin  anticoagulation followed by apixaban -discharged 12/01/2023 Lower extremity Dopplers 11/30/2023: Acute left posterior tibial, peroneal, and TP trunk DVTs, acute right peroneal DVT Lovenox  12/31/2023   3.  CVAs-visual disturbance and altered mental status 12/30/2023 MRI brain-many foci of restricted diffusion in the cerebellum, left frontal, left basal ganglia, and parietal lobes compatible with acute/subacute infarcts, consider embolic etiology 4.  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  Kidney stones 6.  Gout 7.  Family history of pancreas cancer 8.  History of basal cell carcinoma 9.  Negative genetic testing with VUS in EGFR.  First-degree relatives are candidates for considering pancreatic cancer screening based on family history.  Discussed with patient and wife 01/30/2024.      Disposition: [name] appears unchanged.  He has completed 3 cycles of gemcitabine /Abraxane , overall tolerating well.  Plan to proceed with cycle 4 today as scheduled.  We will follow-up on the CA 19-9.  Plan for restaging CTs after 5 or 6 cycles.  CBC and chemistry panel reviewed.  Labs adequate to proceed as above.  He will return for follow-up and treatment in 2 weeks.  We are available to see him sooner if needed.  Diana Forster ANP/GNP-BC   02/27/2024  11:00 AM

## 2024-02-27 NOTE — Patient Instructions (Signed)
 [facility] - A DEPT OF [place]  Discharge Instructions: Thank you for choosing [facility] to provide your oncology and hematology care.   If you have a lab appointment with the Cancer Center, please go directly to the Cancer Center and check in at the registration area.   Wear comfortable clothing and clothing appropriate for easy access to any Portacath or PICC line.   We strive to give you quality time with your provider. You may need to reschedule your appointment if you arrive late (15 or more minutes).  Arriving late affects you and other patients whose appointments are after yours.  Also, if you miss three or more appointments without notifying the office, you may be dismissed from the clinic at the provider's discretion.      For prescription refill requests, have your pharmacy contact our office and allow 72 hours for refills to be completed.    Today you received the following chemotherapy and/or immunotherapy agents Abraxane  and Gemzar       To help prevent nausea and vomiting after your treatment, we encourage you to take your nausea medication as directed.  BELOW ARE SYMPTOMS THAT SHOULD BE REPORTED IMMEDIATELY: *FEVER GREATER THAN 100.4 F (38 C) OR HIGHER *CHILLS OR SWEATING *NAUSEA AND VOMITING THAT IS NOT CONTROLLED WITH YOUR NAUSEA MEDICATION *UNUSUAL SHORTNESS OF BREATH *UNUSUAL BRUISING OR BLEEDING *URINARY PROBLEMS (pain or burning when urinating, or frequent urination) *BOWEL PROBLEMS (unusual diarrhea, constipation, pain near the anus) TENDERNESS IN MOUTH AND THROAT WITH OR WITHOUT PRESENCE OF ULCERS (sore throat, sores in mouth, or a toothache) UNUSUAL RASH, SWELLING OR PAIN  UNUSUAL VAGINAL DISCHARGE OR ITCHING   Items with * indicate a potential emergency and should be followed up as soon as possible or go to the Emergency Department if any problems should occur.  Please show the CHEMOTHERAPY ALERT CARD or  IMMUNOTHERAPY ALERT CARD at check-in to the Emergency Department and triage nurse.  Should you have questions after your visit or need to cancel or reschedule your appointment, please contact [facility]  Dept: [phone]  and follow the prompts.  Office hours are 8:00 a.m. to 4:30 p.m. Monday - Friday. Please note that voicemails left after 4:00 p.m. may not be returned until the following business day.  We are closed weekends and major holidays. You have access to a nurse at all times for urgent questions. Please call the main number to the clinic Dept: [phone] and follow the prompts.   For any non-urgent questions, you may also contact your provider using MyChart. We now offer e-Visits for anyone 31 and older to request care online for non-urgent symptoms. For details visit [email or web address].   Also download the MyChart app! Go to the app store, search "MyChart", open the app, select , and log in with your MyChart username and password.

## 2024-02-28 LAB — CANCER ANTIGEN 19-9: CA 19-9: 2362 U/mL — ABNORMAL HIGH (ref 0–35)

## 2024-03-04 ENCOUNTER — Other Ambulatory Visit: Payer: Self-pay

## 2024-03-05 DIAGNOSIS — H532 Diplopia: Secondary | ICD-10-CM | POA: Diagnosis not present

## 2024-03-12 ENCOUNTER — Inpatient Hospital Stay

## 2024-03-12 ENCOUNTER — Inpatient Hospital Stay: Attending: Oncology

## 2024-03-12 ENCOUNTER — Encounter: Payer: Self-pay | Admitting: Nurse Practitioner

## 2024-03-12 ENCOUNTER — Inpatient Hospital Stay: Admitting: Nurse Practitioner

## 2024-03-12 VITALS — BP 139/75 | HR 73 | Resp 20

## 2024-03-12 VITALS — BP 133/70 | HR 72 | Temp 98.1°F | Resp 18 | Wt 225.1 lb

## 2024-03-12 DIAGNOSIS — C250 Malignant neoplasm of head of pancreas: Secondary | ICD-10-CM

## 2024-03-12 DIAGNOSIS — Z79899 Other long term (current) drug therapy: Secondary | ICD-10-CM | POA: Diagnosis not present

## 2024-03-12 DIAGNOSIS — C7800 Secondary malignant neoplasm of unspecified lung: Secondary | ICD-10-CM | POA: Diagnosis present

## 2024-03-12 DIAGNOSIS — C770 Secondary and unspecified malignant neoplasm of lymph nodes of head, face and neck: Secondary | ICD-10-CM | POA: Diagnosis not present

## 2024-03-12 DIAGNOSIS — Z5111 Encounter for antineoplastic chemotherapy: Secondary | ICD-10-CM | POA: Insufficient documentation

## 2024-03-12 LAB — CBC WITH DIFFERENTIAL (CANCER CENTER ONLY)
Abs Immature Granulocytes: 0.12 10*3/uL — ABNORMAL HIGH (ref 0.00–0.07)
Basophils Absolute: 0.1 10*3/uL (ref 0.0–0.1)
Basophils Relative: 1 %
Eosinophils Absolute: 0.1 10*3/uL (ref 0.0–0.5)
Eosinophils Relative: 2 %
HCT: 39.1 % (ref 39.0–52.0)
Hemoglobin: 13.2 g/dL (ref 13.0–17.0)
Immature Granulocytes: 2 %
Lymphocytes Relative: 20 %
Lymphs Abs: 1.3 10*3/uL (ref 0.7–4.0)
MCH: 32.7 pg (ref 26.0–34.0)
MCHC: 33.8 g/dL (ref 30.0–36.0)
MCV: 96.8 fL (ref 80.0–100.0)
Monocytes Absolute: 0.9 10*3/uL (ref 0.1–1.0)
Monocytes Relative: 12 %
Neutro Abs: 4.3 10*3/uL (ref 1.7–7.7)
Neutrophils Relative %: 63 %
Platelet Count: 202 10*3/uL (ref 150–400)
RBC: 4.04 MIL/uL — ABNORMAL LOW (ref 4.22–5.81)
RDW: 16.4 % — ABNORMAL HIGH (ref 11.5–15.5)
WBC Count: 6.9 10*3/uL (ref 4.0–10.5)
nRBC: 0 % (ref 0.0–0.2)

## 2024-03-12 LAB — CMP (CANCER CENTER ONLY)
ALT: 39 U/L (ref 0–44)
AST: 23 U/L (ref 15–41)
Albumin: 3.8 g/dL (ref 3.5–5.0)
Alkaline Phosphatase: 57 U/L (ref 38–126)
Anion gap: 11 (ref 5–15)
BUN: 17 mg/dL (ref 8–23)
CO2: 22 mmol/L (ref 22–32)
Calcium: 9.3 mg/dL (ref 8.9–10.3)
Chloride: 107 mmol/L (ref 98–111)
Creatinine: 1.09 mg/dL (ref 0.61–1.24)
GFR, Estimated: >60 mL/min (ref >60)
Glucose, Bld: 125 mg/dL — ABNORMAL HIGH (ref 70–99)
Potassium: 4 mmol/L (ref 3.5–5.1)
Sodium: 140 mmol/L (ref 135–145)
Total Bilirubin: 0.2 mg/dL (ref 0.0–1.2)
Total Protein: 6.3 g/dL — ABNORMAL LOW (ref 6.5–8.1)

## 2024-03-12 MED ORDER — SODIUM CHLORIDE 0.9 % IV SOLN
2000.0000 mg | Freq: Once | INTRAVENOUS | Status: AC
  Administered 2024-03-12: 2000 mg via INTRAVENOUS
  Filled 2024-03-12: qty 52.6

## 2024-03-12 MED ORDER — PACLITAXEL PROTEIN-BOUND CHEMO INJECTION 100 MG
100.0000 mg/m2 | Freq: Once | INTRAVENOUS | Status: AC
  Administered 2024-03-12: 225 mg via INTRAVENOUS
  Filled 2024-03-12: qty 45

## 2024-03-12 MED ORDER — SODIUM CHLORIDE 0.9 % IV SOLN: INTRAVENOUS | Status: DC

## 2024-03-12 MED ORDER — PROCHLORPERAZINE MALEATE 10 MG PO TABS
10.0000 mg | ORAL_TABLET | Freq: Once | ORAL | Status: DC
Start: 2024-03-12 — End: 2024-03-12

## 2024-03-12 MED ORDER — HEPARIN SOD (PORK) LOCK FLUSH 100 UNIT/ML IV SOLN
500.0000 [IU] | Freq: Once | INTRAVENOUS | Status: AC | PRN
  Administered 2024-03-12: 500 [IU]

## 2024-03-12 MED ORDER — SODIUM CHLORIDE 0.9% FLUSH
10.0000 mL | INTRAVENOUS | Status: DC | PRN
  Administered 2024-03-12: 10 mL

## 2024-03-12 NOTE — Progress Notes (Signed)
 Patient seen by Lonna Cobb NP today  Vitals are within treatment parameters:Yes   Labs are within treatment parameters: Yes   Treatment plan has been signed: Yes   Per physician team, Patient is ready for treatment and there are NO modifications to the treatment plan.

## 2024-03-12 NOTE — Patient Instructions (Signed)
 [facility] A DEPT OF [facility]   Discharge Instructions: Thank you for choosing [facility] to provide your oncology and hematology care.   If you have a lab appointment with the Cancer Center, please go directly to the Cancer Center and check in at the registration area.   Wear comfortable clothing and clothing appropriate for easy access to any Portacath or PICC line.   We strive to give you quality time with your provider. You may need to reschedule your appointment if you arrive late (15 or more minutes).  Arriving late affects you and other patients whose appointments are after yours.  Also, if you miss three or more appointments without notifying the office, you may be dismissed from the clinic at the provider's discretion.      For prescription refill requests, have your pharmacy contact our office and allow 72 hours for refills to be completed.    Today you received the following chemotherapy and/or immunotherapy agents Paclitaxel-protein bound (ABRAXANE) & Gemcitabine (GEMZAR).      To help prevent nausea and vomiting after your treatment, we encourage you to take your nausea medication as directed.  BELOW ARE SYMPTOMS THAT SHOULD BE REPORTED IMMEDIATELY: *FEVER GREATER THAN 100.4 F (38 C) OR HIGHER *CHILLS OR SWEATING *NAUSEA AND VOMITING THAT IS NOT CONTROLLED WITH YOUR NAUSEA MEDICATION *UNUSUAL SHORTNESS OF BREATH *UNUSUAL BRUISING OR BLEEDING *URINARY PROBLEMS (pain or burning when urinating, or frequent urination) *BOWEL PROBLEMS (unusual diarrhea, constipation, pain near the anus) TENDERNESS IN MOUTH AND THROAT WITH OR WITHOUT PRESENCE OF ULCERS (sore throat, sores in mouth, or a toothache) UNUSUAL RASH, SWELLING OR PAIN  UNUSUAL VAGINAL DISCHARGE OR ITCHING   Items with * indicate a potential emergency and should be followed up as soon as possible or go to the Emergency Department if any problems should occur.  Please  show the CHEMOTHERAPY ALERT CARD or IMMUNOTHERAPY ALERT CARD at check-in to the Emergency Department and triage nurse.  Should you have questions after your visit or need to cancel or reschedule your appointment, please contact [facility]  Dept: [phone]  and follow the prompts.  Office hours are 8:00 a.m. to 4:30 p.m. Monday - Friday. Please note that voicemails left after 4:00 p.m. may not be returned until the following business day.  We are closed weekends and major holidays. You have access to a nurse at all times for urgent questions. Please call the main number to the clinic Dept: [phone] and follow the prompts.   For any non-urgent questions, you may also contact your provider using MyChart. We now offer e-Visits for anyone 52 and older to request care online for non-urgent symptoms. For details visit [email or web address].   Also download the MyChart app! Go to the app store, search "MyChart", open the app, select [place], and log in with your MyChart username and password.   Paclitaxel Nanoparticle Albumin-Bound Injection What is this medication? NANOPARTICLE ALBUMIN-BOUND PACLITAXEL (Na no PAHR ti kuhl al BYOO muhn-bound PAK li TAX el) treats some types of cancer. It works by slowing down the growth of cancer cells. This medicine may be used for other purposes; ask your health care provider or pharmacist if you have questions. COMMON BRAND NAME(S): Abraxane What should I tell my care team before I take this medication? They need to know if you have any of these conditions: Liver disease Low white blood  cell levels An unusual or allergic reaction to paclitaxel, albumin, other medications, foods, dyes, or preservatives If you or your partner are pregnant or trying to get pregnant Breast-feeding How should I use this medication? This medication is injected into a vein. It is given by your care team in a hospital or  clinic setting. Talk to your care team about the use of this medication in children. Special care may be needed. Overdosage: If you think you have taken too much of this medicine contact a poison control center or emergency room at once. NOTE: This medicine is only for you. Do not share this medicine with others. What if I miss a dose? Keep appointments for follow-up doses. It is important not to miss your dose. Call your care team if you are unable to keep an appointment. What may interact with this medication? Other medications may affect the way this medication works. Talk with your care team about all of the medications you take. They may suggest changes to your treatment plan to lower the risk of side effects and to make sure your medications work as intended. This list may not describe all possible interactions. Give your health care provider a list of all the medicines, herbs, non-prescription drugs, or dietary supplements you use. Also tell them if you smoke, drink alcohol, or use illegal drugs. Some items may interact with your medicine. What should I watch for while using this medication? Your condition will be monitored carefully while you are receiving this medication. You may need blood work while taking this medication. This medication may make you feel generally unwell. This is not uncommon as chemotherapy can affect healthy cells as well as cancer cells. Report any side effects. Continue your course of treatment even though you feel ill unless your care team tells you to stop. This medication can cause serious allergic reactions. To reduce the risk, your care team may give you other medications to take before receiving this one. Be sure to follow the directions from your care team. This medication may increase your risk of getting an infection. Call your care team for advice if you get a fever, chills, sore throat, or other symptoms of a cold or flu. Do not treat yourself. Try to avoid  being around people who are sick. This medication may increase your risk to bruise or bleed. Call your care team if you notice any unusual bleeding. Be careful brushing or flossing your teeth or using a toothpick because you may get an infection or bleed more easily. If you have any dental work done, tell your dentist you are receiving this medication. Talk to your care team if you or your partner may be pregnant. Serious birth defects can occur if you take this medication during pregnancy and for 6 months after the last dose. You will need a negative pregnancy test before starting this medication. Contraception is recommended while taking this medication and for 6 months after the last dose. Your care team can help you find the option that works for you. If your partner can get pregnant, use a condom during sex while taking this medication and for 3 months after the last dose. Do not breastfeed while taking this medication and for 2 weeks after the last dose. This medication may cause infertility. Talk to your care team if you are concerned about your fertility. What side effects may I notice from receiving this medication? Side effects that you should report to your care team as soon as  possible: Allergic reactions--skin rash, itching, hives, swelling of the face, lips, tongue, or throat Dry cough, shortness of breath or trouble breathing Infection--fever, chills, cough, sore throat, wounds that don't heal, pain or trouble when passing urine, general feeling of discomfort or being unwell Low red blood cell level--unusual weakness or fatigue, dizziness, headache, trouble breathing Pain, tingling, or numbness in the hands or feet Stomach pain, unusual weakness or fatigue, nausea, vomiting, diarrhea, or fever that lasts longer than expected Unusual bruising or bleeding Side effects that usually do not require medical attention (report to your care team if they continue or are  bothersome): Diarrhea Fatigue Hair loss Loss of appetite Nausea Vomiting This list may not describe all possible side effects. Call your doctor for medical advice about side effects. You may report side effects to FDA at 1-800-FDA-1088. Where should I keep my medication? This medication is given in a hospital or clinic. It will not be stored at home. NOTE: This sheet is a summary. It may not cover all possible information. If you have questions about this medicine, talk to your doctor, pharmacist, or health care provider.  2024 Elsevier/Gold Standard (2022-02-08 00:00:00)  Gemcitabine Injection What is this medication? GEMCITABINE (jem SYE ta been) treats some types of cancer. It works by slowing down the growth of cancer cells. This medicine may be used for other purposes; ask your health care provider or pharmacist if you have questions. COMMON BRAND NAME(S): Gemzar, Infugem What should I tell my care team before I take this medication? They need to know if you have any of these conditions: Blood disorders Infection Kidney disease Liver disease Lung or breathing disease, such as asthma or COPD Recent or ongoing radiation therapy An unusual or allergic reaction to gemcitabine, other medications, foods, dyes, or preservatives If you or your partner are pregnant or trying to get pregnant Breast-feeding How should I use this medication? This medication is injected into a vein. It is given by your care team in a hospital or clinic setting. Talk to your care team about the use of this medication in children. Special care may be needed. Overdosage: If you think you have taken too much of this medicine contact a poison control center or emergency room at once. NOTE: This medicine is only for you. Do not share this medicine with others. What if I miss a dose? Keep appointments for follow-up doses. It is important not to miss your dose. Call your care team if you are unable to keep an  appointment. What may interact with this medication? Interactions have not been studied. This list may not describe all possible interactions. Give your health care provider a list of all the medicines, herbs, non-prescription drugs, or dietary supplements you use. Also tell them if you smoke, drink alcohol, or use illegal drugs. Some items may interact with your medicine. What should I watch for while using this medication? Your condition will be monitored carefully while you are receiving this medication. This medication may make you feel generally unwell. This is not uncommon, as chemotherapy can affect healthy cells as well as cancer cells. Report any side effects. Continue your course of treatment even though you feel ill unless your care team tells you to stop. In some cases, you may be given additional medications to help with side effects. Follow all directions for their use. This medication may increase your risk of getting an infection. Call your care team for advice if you get a fever, chills, sore throat, or  other symptoms of a cold or flu. Do not treat yourself. Try to avoid being around people who are sick. This medication may increase your risk to bruise or bleed. Call your care team if you notice any unusual bleeding. Be careful brushing or flossing your teeth or using a toothpick because you may get an infection or bleed more easily. If you have any dental work done, tell your dentist you are receiving this medication. Avoid taking medications that contain aspirin, acetaminophen, ibuprofen, naproxen, or ketoprofen unless instructed by your care team. These medications may hide a fever. Talk to your care team if you or your partner wish to become pregnant or think you might be pregnant. This medication can cause serious birth defects if taken during pregnancy and for 6 months after the last dose. A negative pregnancy test is required before starting this medication. A reliable form of  contraception is recommended while taking this medication and for 6 months after the last dose. Talk to your care team about effective forms of contraception. Do not father a child while taking this medication and for 3 months after the last dose. Use a condom while having sex during this time period. Do not breastfeed while taking this medication and for at least 1 week after the last dose. This medication may cause infertility. Talk to your care team if you are concerned about your fertility. What side effects may I notice from receiving this medication? Side effects that you should report to your care team as soon as possible: Allergic reactions--skin rash, itching, hives, swelling of the face, lips, tongue, or throat Capillary leak syndrome--stomach or muscle pain, unusual weakness or fatigue, feeling faint or lightheaded, decrease in the amount of urine, swelling of the ankles, hands, or feet, trouble breathing Infection--fever, chills, cough, sore throat, wounds that don't heal, pain or trouble when passing urine, general feeling of discomfort or being unwell Liver injury--right upper belly pain, loss of appetite, nausea, light-colored stool, dark yellow or brown urine, yellowing skin or eyes, unusual weakness or fatigue Low red blood cell level--unusual weakness or fatigue, dizziness, headache, trouble breathing Lung injury--shortness of breath or trouble breathing, cough, spitting up blood, chest pain, fever Stomach pain, bloody diarrhea, pale skin, unusual weakness or fatigue, decrease in the amount of urine, which may be signs of hemolytic uremic syndrome Sudden and severe headache, confusion, change in vision, seizures, which may be signs of posterior reversible encephalopathy syndrome (PRES) Unusual bruising or bleeding Side effects that usually do not require medical attention (report to your care team if they continue or are bothersome): Diarrhea Drowsiness Hair loss Nausea Pain,  redness, or swelling with sores inside the mouth or throat Vomiting This list may not describe all possible side effects. Call your doctor for medical advice about side effects. You may report side effects to FDA at 1-800-FDA-1088. Where should I keep my medication? This medication is given in a hospital or clinic. It will not be stored at home. NOTE: This sheet is a summary. It may not cover all possible information. If you have questions about this medicine, talk to your doctor, pharmacist, or health care provider.  2024 Elsevier/Gold Standard (2022-01-30 00:00:00)

## 2024-03-12 NOTE — Progress Notes (Signed)
 [facility] OFFICE PROGRESS NOTE   Diagnosis: Pancreas cancer  INTERVAL HISTORY:   Timothy Wilcox returns as scheduled.  He completed cycle 4 gemcitabine /Abraxane  02/27/2024.  He had mild nausea on days 2 and 3, relieved with Compazine .  No vomiting.  No mouth sores.  No diarrhea.  No fever or rash after treatment.  Left great toe felt numb last night.  No numbness this morning.  No numbness or tingling in the hands.  He reports intermittent episodes of diplopia since diagnosis.  No diplopia at present.  He continues Lovenox .  He denies spontaneous bleeding.  Objective:  Vital signs in last 24 hours:  Blood pressure 133/70, pulse 72, temperature 98.1 F (36.7 C), temperature source Temporal, resp. rate 18, weight 225 lb 1.6 oz (102.1 kg), SpO2 99%.    HEENT: No thrush or ulcers.  Pupils equal round and reactive to light.  Extraocular movements intact. Resp: Lungs clear bilaterally. Cardio: Regular rate and rhythm. GI: No hepatosplenomegaly.  No apparent ascites. Vascular: Trace lower leg edema bilaterally. Neuro: Alert and oriented. Skin: Ecchymoses in various stages of resolution scattered over the abdominal wall. Port-A-Cath without erythema.   Lab Results:  Lab Results  Component Value Date   WBC 6.9 03/12/2024   HGB 13.2 03/12/2024   HCT 39.1 03/12/2024   MCV 96.8 03/12/2024   PLT 202 03/12/2024   NEUTROABS 4.3 03/12/2024    Imaging:  No results found.  Medications: I have reviewed the patient's current medications.  Assessment/Plan: Pancreas cancer 11/19/2022: 2.9 cm thin-walled cystic lesion in the pancreas uncinate 07/28/2023: CT abdomen/pelvis: Stable 2.8 cm cystic area in the uncinate process 11/29/2023: CT chest-subsegmental and segmental pulmonary emboli in the left upper lobe, dilation of the right ventricle, multiple pulmonary nodules 12/26/2023: CA 19-9- 08,657 12/27/2023: PET-5 cm hypermetabolic pancreas head/uncinate mass, numerous  hypermetabolic pulmonary nodules, hypermetabolic left supraclavicular and retroperitoneal lymph nodes 01/06/2024: Left supraclavicular lymph node biopsy-metastatic moderately differentiated adenocarcinoma, 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, upper GI, breast, and salivary gland carcinoma Foundation 1 01/06/2024-HRDsig negative, microsatellite stable, tumor mutation burden 2, K-ras G12R Cycle 1 gemcitabine /Abraxane  01/16/2024 Cycle 2 gemcitabine /Abraxane  01/30/2024 Cycle 3 gemcitabine /Abraxane  02/13/2024 Cycle 4 gemcitabine /Abraxane  02/27/2024 Cycle 5 gemcitabine /Abraxane  03/12/2024 Pulmonary embolism 11/29/2023 Small segmental and subsegmental pulmonary emboli in the left upper lobe, dilation of right ventricle Admitted for heparin  anticoagulation followed by apixaban -discharged 12/01/2023 Lower extremity Dopplers 11/30/2023: Acute left posterior tibial, peroneal, and TP trunk DVTs, acute right peroneal DVT Lovenox  12/31/2023   3.  CVAs-visual disturbance and altered mental status 12/30/2023 MRI brain-many foci of restricted diffusion in the cerebellum, left frontal, left basal ganglia, and parietal lobes compatible with acute/subacute infarcts, consider embolic etiology 4.  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  Kidney stones 6.  Gout 7.  Family history of pancreas cancer 8.  History of basal cell carcinoma 9.  Negative genetic testing with VUS in EGFR.  First-degree relatives are candidates for considering pancreatic cancer screening based on family history.  Discussed with patient and wife 01/30/2024.  Disposition: Mr. Grimley appears stable.  He has completed 4 cycles of gemcitabine /Abraxane .  He is tolerating treatment well.  Performance status is better.  Most recent CA 19-9 significantly improved.  Plan to proceed with cycle 5 today as scheduled.  Restaging CTs prior to cycle 6.  CBC and chemistry panel reviewed.  Labs adequate to proceed as  above.  He will return for follow-up and treatment in 2 weeks.    Edwina Gram  Timothy Wilcox ANP/GNP-BC   03/12/2024  10:02 AM

## 2024-03-22 ENCOUNTER — Other Ambulatory Visit: Payer: Self-pay | Admitting: Oncology

## 2024-03-23 ENCOUNTER — Ambulatory Visit (HOSPITAL_BASED_OUTPATIENT_CLINIC_OR_DEPARTMENT_OTHER)
Admission: RE | Admit: 2024-03-23 | Discharge: 2024-03-23 | Disposition: A | Discharge status: Home / Self Care | Source: Ambulatory Visit | Attending: Nurse Practitioner | Admitting: Nurse Practitioner

## 2024-03-23 DIAGNOSIS — C250 Malignant neoplasm of head of pancreas: Secondary | ICD-10-CM | POA: Insufficient documentation

## 2024-03-23 DIAGNOSIS — K8689 Other specified diseases of pancreas: Secondary | ICD-10-CM | POA: Diagnosis not present

## 2024-03-23 DIAGNOSIS — I7143 Infrarenal abdominal aortic aneurysm, without rupture: Secondary | ICD-10-CM | POA: Diagnosis not present

## 2024-03-23 DIAGNOSIS — K2950 Unspecified chronic gastritis without bleeding: Secondary | ICD-10-CM | POA: Diagnosis not present

## 2024-03-23 MED ORDER — IOHEXOL 300 MG/ML  SOLN
100.0000 mL | Freq: Once | INTRAMUSCULAR | Status: AC | PRN
  Administered 2024-03-23: 100 mL via INTRAVENOUS

## 2024-03-27 ENCOUNTER — Inpatient Hospital Stay

## 2024-03-27 ENCOUNTER — Encounter: Payer: Self-pay | Admitting: Oncology

## 2024-03-27 ENCOUNTER — Inpatient Hospital Stay: Admitting: Oncology

## 2024-03-27 VITALS — BP 130/75 | HR 83 | Temp 97.8°F | Resp 18 | Ht 66.0 in | Wt 230.0 lb

## 2024-03-27 DIAGNOSIS — Z5111 Encounter for antineoplastic chemotherapy: Secondary | ICD-10-CM | POA: Diagnosis not present

## 2024-03-27 DIAGNOSIS — C250 Malignant neoplasm of head of pancreas: Secondary | ICD-10-CM

## 2024-03-27 LAB — CBC WITH DIFFERENTIAL (CANCER CENTER ONLY)
Abs Immature Granulocytes: 0.06 10*3/uL (ref 0.00–0.07)
Basophils Absolute: 0.1 10*3/uL (ref 0.0–0.1)
Basophils Relative: 1 %
Eosinophils Absolute: 0.2 10*3/uL (ref 0.0–0.5)
Eosinophils Relative: 3 %
HCT: 38.7 % — ABNORMAL LOW (ref 39.0–52.0)
Hemoglobin: 13 g/dL (ref 13.0–17.0)
Immature Granulocytes: 1 %
Lymphocytes Relative: 20 %
Lymphs Abs: 1.1 10*3/uL (ref 0.7–4.0)
MCH: 32.9 pg (ref 26.0–34.0)
MCHC: 33.6 g/dL (ref 30.0–36.0)
MCV: 98 fL (ref 80.0–100.0)
Monocytes Absolute: 0.6 10*3/uL (ref 0.1–1.0)
Monocytes Relative: 11 %
Neutro Abs: 3.6 10*3/uL (ref 1.7–7.7)
Neutrophils Relative %: 64 %
Platelet Count: 267 10*3/uL (ref 150–400)
RBC: 3.95 MIL/uL — ABNORMAL LOW (ref 4.22–5.81)
RDW: 16.9 % — ABNORMAL HIGH (ref 11.5–15.5)
WBC Count: 5.6 10*3/uL (ref 4.0–10.5)
nRBC: 0 % (ref 0.0–0.2)

## 2024-03-27 LAB — CMP (CANCER CENTER ONLY)
ALT: 34 U/L (ref 0–44)
AST: 23 U/L (ref 15–41)
Albumin: 3.6 g/dL (ref 3.5–5.0)
Alkaline Phosphatase: 58 U/L (ref 38–126)
Anion gap: 11 (ref 5–15)
BUN: 11 mg/dL (ref 8–23)
CO2: 22 mmol/L (ref 22–32)
Calcium: 9.2 mg/dL (ref 8.9–10.3)
Chloride: 105 mmol/L (ref 98–111)
Creatinine: 0.93 mg/dL (ref 0.61–1.24)
GFR, Estimated: >60 mL/min (ref >60)
Glucose, Bld: 177 mg/dL — ABNORMAL HIGH (ref 70–99)
Potassium: 4.2 mmol/L (ref 3.5–5.1)
Sodium: 137 mmol/L (ref 135–145)
Total Bilirubin: 0.2 mg/dL (ref 0.0–1.2)
Total Protein: 6.2 g/dL — ABNORMAL LOW (ref 6.5–8.1)

## 2024-03-27 MED ORDER — HEPARIN SOD (PORK) LOCK FLUSH 100 UNIT/ML IV SOLN
500.0000 [IU] | Freq: Once | INTRAVENOUS | Status: AC | PRN
  Administered 2024-03-27: 500 [IU]

## 2024-03-27 MED ORDER — PACLITAXEL PROTEIN-BOUND CHEMO INJECTION 100 MG
100.0000 mg/m2 | Freq: Once | INTRAVENOUS | Status: AC
  Administered 2024-03-27: 225 mg via INTRAVENOUS
  Filled 2024-03-27: qty 45

## 2024-03-27 MED ORDER — NITROGLYCERIN 0.4 MG SL SUBL: 0.4000 mg | SUBLINGUAL_TABLET | SUBLINGUAL | 0 refills | Status: AC | PRN

## 2024-03-27 MED ORDER — ZOLPIDEM TARTRATE 10 MG PO TABS
10.0000 mg | ORAL_TABLET | Freq: Every evening | ORAL | 1 refills | Status: DC | PRN
Start: 2024-03-27 — End: 2024-06-01

## 2024-03-27 MED ORDER — ONDANSETRON HCL 8 MG PO TABS
8.0000 mg | ORAL_TABLET | Freq: Once | ORAL | Status: AC
  Administered 2024-03-27: 8 mg via ORAL
  Filled 2024-03-27: qty 1

## 2024-03-27 MED ORDER — SODIUM CHLORIDE 0.9 % IV SOLN
2200.0000 mg | Freq: Once | INTRAVENOUS | Status: AC
  Administered 2024-03-27: 2200 mg via INTRAVENOUS
  Filled 2024-03-27: qty 52.57

## 2024-03-27 MED ORDER — SODIUM CHLORIDE 0.9 % IV SOLN: INTRAVENOUS | Status: DC

## 2024-03-27 MED ORDER — SODIUM CHLORIDE 0.9% FLUSH
10.0000 mL | INTRAVENOUS | Status: DC | PRN
  Administered 2024-03-27: 10 mL

## 2024-03-27 MED ORDER — PROCHLORPERAZINE MALEATE 10 MG PO TABS: 10.0000 mg | ORAL_TABLET | Freq: Four times a day (QID) | ORAL | 1 refills | Status: DC | PRN

## 2024-03-27 NOTE — Progress Notes (Signed)
 Patient seen by Dr. [name] today  Vitals are within treatment parameters:Yes   Labs are within treatment parameters: Yes   Treatment plan has been signed: Yes   Per physician team, Patient is ready for treatment. Please note the following modifications: Premed changed from Compazine  to Zofran 

## 2024-03-27 NOTE — Progress Notes (Signed)
 [facility] OFFICE PROGRESS NOTE   Diagnosis: Pancreas cancer  INTERVAL HISTORY:   Timothy Wilcox returns as scheduled.  He completed another cycle of gemcitabine  Abraxane  on 03/12/2024.  No fever or rash.  He reports nausea following chemotherapy.  No emesis.  He continues Lovenox  anticoagulation.  He has a persistent visual disturbance.  He has exertional dyspnea.  No pain.  Objective:  Vital signs in last 24 hours:  Blood pressure 130/75, pulse 83, temperature 97.8 F (36.6 C), temperature source Temporal, resp. rate 18, height 5' 6 (1.676 m), weight 230 lb (104.3 kg), SpO2 100%.    HEENT: No thrush or ulcers Resp: Lungs clear bilaterally Cardio: Regular rate and rhythm GI: No mass, nontender, no hepatosplenomegaly Vascular: No leg edema   Portacath/PICC-without erythema  Lab Results:  Lab Results  Component Value Date   WBC 5.6 03/27/2024   HGB 13.0 03/27/2024   HCT 38.7 (L) 03/27/2024   MCV 98.0 03/27/2024   PLT 267 03/27/2024   NEUTROABS 3.6 03/27/2024    CMP  Lab Results  Component Value Date   NA 137 03/27/2024   K 4.2 03/27/2024   CL 105 03/27/2024   CO2 22 03/27/2024   GLUCOSE 177 (H) 03/27/2024   BUN 11 03/27/2024   CREATININE 0.93 03/27/2024   CALCIUM  9.2 03/27/2024   PROT 6.2 (L) 03/27/2024   ALBUMIN  3.6 03/27/2024   AST 23 03/27/2024   ALT 34 03/27/2024   ALKPHOS 58 03/27/2024   BILITOT 0.2 03/27/2024   GFRNONAA >60 03/27/2024   GFRAA >60 11/08/2018    Lab Results  Component Value Date   ION629 2,362 (H) 02/27/2024    Imaging:  CT CHEST ABDOMEN PELVIS W CONTRAST Result Date: 03/26/2024 CLINICAL DATA:  History of metastatic pancreatic cancer, staging. * Tracking Code: BO * EXAM: CT CHEST, ABDOMEN, AND PELVIS WITH CONTRAST TECHNIQUE: Multidetector CT imaging of the chest, abdomen and pelvis was performed following the standard protocol during bolus administration of intravenous contrast. RADIATION DOSE REDUCTION: This exam  was performed according to the departmental dose-optimization program which includes automated exposure control, adjustment of the mA and/or kV according to patient size and/or use of iterative reconstruction technique. CONTRAST:  OMNIPAQUE  IOHEXOL  300 MG/ML  SOLN COMPARISON:  Multiple priors including PET-CT December 27, 2023 FINDINGS: CT CHEST FINDINGS Cardiovascular: Right chest Port-A-Cath with tip near the superior cavoatrial junction. Aortic atherosclerosis. Normal size heart. Coronary artery calcifications. Mediastinum/Nodes: No suspicious thyroid  nodule. No pathologically enlarged mediastinal, hilar or axillary lymph nodes. Decreased size of the left supraclavicular lymph nodes lymph node now measures 4 mm in short axis on image 8/2 previously 8 mm. Lungs/Pleura: Innumerable bilateral pulmonary nodules some of which are stable in size, others are decreased in size and others which have increased in size. For reference: -right lower lobe pulmonary nodule measures 9 mm on image 98/3 previously 16 mm -left lower lobe pulmonary nodule measures 11 mm on image 98/3 previously 8 mm. Musculoskeletal: No aggressive lytic or blastic lesion of bone. CT ABDOMEN PELVIS FINDINGS Hepatobiliary: No suspicious hepatic lesion. Gallbladder is unremarkable. No biliary ductal dilation Pancreas: Mass in the pancreatic head/uncinate process which was hypermetabolic on prior PET-CT and surrounds a cystic areas seen on prior CTs is ill-defined measuring approximately 4.3 cm on image 64/2 previously 5.0 cm. Similar atrophy of the pancreatic tail. Spleen: Calcified splenic granulomata. Adrenals/Urinary Tract: No suspicious adrenal nodule/mass. No hydronephrosis. Nonobstructive bilateral renal stones. Urinary bladder is unremarkable for degree of distension. Stomach/Bowel: Stomach  is unremarkable for degree of distension. No pathologic dilation of small or large bowel. Submucosal fibrofatty infiltration of the gastric antrum and  proximal duodenum compatible with sequela of chronic inflammation. Colonic diverticulosis without findings of acute diverticulitis. Vascular/Lymphatic: Unchanged infrarenal abdominal aortic aneurysm measuring 3.9 cm. Decreased size of the retroperitoneal lymph nodes for reference and aortocaval lymph node measuring 11 mm in short axis on image 76/2 previously measured 16 mm. Reproductive: Prostate is unremarkable. Other: No significant abdominopelvic free fluid. No discrete peritoneal or omental nodularity. Subcutaneous stranding nodularity likely reflect sequela of subcutaneous injections. Musculoskeletal: No aggressive lytic or blastic lesion of bone. Multilevel degenerative change of the spine. IMPRESSION: 1. Ill-defined mass in the pancreatic head/uncinate process measures approximately 4.3 cm previously 5.0 cm. 2. Variable interval change in the innumerable bilateral pulmonary nodules, some which are stable in size others are decreased in size and others have increased in size. 3. Decreased size the left supraclavicular and retroperitoneal lymph nodes. 4. Unchanged infrarenal abdominal aortic aneurysm measuring 3.9 cm. Suggest attention on follow-up oncologic imaging. 5.  Aortic Atherosclerosis (ICD10-I70.0). Electronically Signed   By: Tama Fails M.D.   On: 03/26/2024 09:56    Medications: I have reviewed the patient's current medications.   Assessment/Plan: Pancreas cancer 11/19/2022: 2.9 cm thin-walled cystic lesion in the pancreas uncinate 07/28/2023: CT abdomen/pelvis: Stable 2.8 cm cystic area in the uncinate process 11/29/2023: CT chest-subsegmental and segmental pulmonary emboli in the left upper lobe, dilation of the right ventricle, multiple pulmonary nodules 12/26/2023: CA 19-9- 40,981 12/27/2023: PET-5 cm hypermetabolic pancreas head/uncinate mass, numerous hypermetabolic pulmonary nodules, hypermetabolic left supraclavicular and retroperitoneal lymph nodes 01/06/2024: Left  supraclavicular lymph node biopsy-metastatic moderately differentiated adenocarcinoma, 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, upper GI, breast, and salivary gland carcinoma Foundation 1 01/06/2024-HRDsig negative, microsatellite stable, tumor mutation burden 2, K-ras G12R Cycle 1 gemcitabine /Abraxane  01/16/2024 Cycle 2 gemcitabine /Abraxane  01/30/2024 Cycle 3 gemcitabine /Abraxane  02/13/2024 Cycle 4 gemcitabine /Abraxane  02/27/2024 Cycle 5 gemcitabine /Abraxane  03/12/2024 03/23/2024 CTs: Decrease size of pancreas head/uncinate mass, mixed response involving bilateral pulmonary nodules, decreased left supraclavicular retroperitoneal lymph nodes Cycle 6 gemcitabine /Abraxane  03/27/2024 Pulmonary embolism 11/29/2023 Small segmental and subsegmental pulmonary emboli in the left upper lobe, dilation of right ventricle Admitted for heparin  anticoagulation followed by apixaban -discharged 12/01/2023 Lower extremity Dopplers 11/30/2023: Acute left posterior tibial, peroneal, and TP trunk DVTs, acute right peroneal DVT Lovenox  12/31/2023   3.  CVAs-visual disturbance and altered mental status 12/30/2023 MRI brain-many foci of restricted diffusion in the cerebellum, left frontal, left basal ganglia, and parietal lobes compatible with acute/subacute infarcts, consider embolic etiology 4.  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  Kidney stones 6.  Gout 7.  Family history of pancreas cancer 8.  History of basal cell carcinoma 9.  Negative genetic testing with VUS in EGFR.  First-degree relatives are candidates for considering pancreatic cancer screening based on family history.  Discussed with patient and wife 01/30/2024.     Disposition: Mr Bostic has metastatic pancreas cancer.  He began treatment with gemcitabine /Abraxane  on 01/16/2024.  His clinical status and the CA 19-9 have improved.  I reviewed the restaging CT findings and images with Mr. Sulton.  The CTs are  consistent with a response to therapy.  There was also a 3-week gap between the baseline PET and beginning of treatment.  Many of the lung nodules appear significantly smaller.   The overall pattern is consistent with a response to the gemcitabine /Abraxane .  I recommend continuing the current treatment.  He had  increased nausea following the last cycle of chemotherapy.  Zofran  will be added to the antiemetic regimen today.  He will let us  know if Zofran  does not help the nausea.  He will return for an office visit in the next cycle of gemcitabine /Abraxane  in 3 weeks.  He continues Lovenox  anticoagulation for treatment of DVT/pulmonary embolism and multivessel distribution CVAs.  [name], MD  03/27/2024  10:43 AM

## 2024-03-27 NOTE — Patient Instructions (Signed)
 [facility] A DEPT OF [facility]   Discharge Instructions: Thank you for choosing [facility] to provide your oncology and hematology care.   If you have a lab appointment with the Cancer Center, please go directly to the Cancer Center and check in at the registration area.   Wear comfortable clothing and clothing appropriate for easy access to any Portacath or PICC line.   We strive to give you quality time with your provider. You may need to reschedule your appointment if you arrive late (15 or more minutes).  Arriving late affects you and other patients whose appointments are after yours.  Also, if you miss three or more appointments without notifying the office, you may be dismissed from the clinic at the provider's discretion.      For prescription refill requests, have your pharmacy contact our office and allow 72 hours for refills to be completed.    Today you received the following chemotherapy and/or immunotherapy agents Paclitaxel-protein bound (ABRAXANE) & Gemcitabine (GEMZAR).      To help prevent nausea and vomiting after your treatment, we encourage you to take your nausea medication as directed.  BELOW ARE SYMPTOMS THAT SHOULD BE REPORTED IMMEDIATELY: *FEVER GREATER THAN 100.4 F (38 C) OR HIGHER *CHILLS OR SWEATING *NAUSEA AND VOMITING THAT IS NOT CONTROLLED WITH YOUR NAUSEA MEDICATION *UNUSUAL SHORTNESS OF BREATH *UNUSUAL BRUISING OR BLEEDING *URINARY PROBLEMS (pain or burning when urinating, or frequent urination) *BOWEL PROBLEMS (unusual diarrhea, constipation, pain near the anus) TENDERNESS IN MOUTH AND THROAT WITH OR WITHOUT PRESENCE OF ULCERS (sore throat, sores in mouth, or a toothache) UNUSUAL RASH, SWELLING OR PAIN  UNUSUAL VAGINAL DISCHARGE OR ITCHING   Items with * indicate a potential emergency and should be followed up as soon as possible or go to the Emergency Department if any problems should occur.  Please  show the CHEMOTHERAPY ALERT CARD or IMMUNOTHERAPY ALERT CARD at check-in to the Emergency Department and triage nurse.  Should you have questions after your visit or need to cancel or reschedule your appointment, please contact [facility]  Dept: [phone]  and follow the prompts.  Office hours are 8:00 a.m. to 4:30 p.m. Monday - Friday. Please note that voicemails left after 4:00 p.m. may not be returned until the following business day.  We are closed weekends and major holidays. You have access to a nurse at all times for urgent questions. Please call the main number to the clinic Dept: [phone] and follow the prompts.   For any non-urgent questions, you may also contact your provider using MyChart. We now offer e-Visits for anyone 52 and older to request care online for non-urgent symptoms. For details visit [email or web address].   Also download the MyChart app! Go to the app store, search "MyChart", open the app, select [place], and log in with your MyChart username and password.   Paclitaxel Nanoparticle Albumin-Bound Injection What is this medication? NANOPARTICLE ALBUMIN-BOUND PACLITAXEL (Na no PAHR ti kuhl al BYOO muhn-bound PAK li TAX el) treats some types of cancer. It works by slowing down the growth of cancer cells. This medicine may be used for other purposes; ask your health care provider or pharmacist if you have questions. COMMON BRAND NAME(S): Abraxane What should I tell my care team before I take this medication? They need to know if you have any of these conditions: Liver disease Low white blood  cell levels An unusual or allergic reaction to paclitaxel, albumin, other medications, foods, dyes, or preservatives If you or your partner are pregnant or trying to get pregnant Breast-feeding How should I use this medication? This medication is injected into a vein. It is given by your care team in a hospital or  clinic setting. Talk to your care team about the use of this medication in children. Special care may be needed. Overdosage: If you think you have taken too much of this medicine contact a poison control center or emergency room at once. NOTE: This medicine is only for you. Do not share this medicine with others. What if I miss a dose? Keep appointments for follow-up doses. It is important not to miss your dose. Call your care team if you are unable to keep an appointment. What may interact with this medication? Other medications may affect the way this medication works. Talk with your care team about all of the medications you take. They may suggest changes to your treatment plan to lower the risk of side effects and to make sure your medications work as intended. This list may not describe all possible interactions. Give your health care provider a list of all the medicines, herbs, non-prescription drugs, or dietary supplements you use. Also tell them if you smoke, drink alcohol, or use illegal drugs. Some items may interact with your medicine. What should I watch for while using this medication? Your condition will be monitored carefully while you are receiving this medication. You may need blood work while taking this medication. This medication may make you feel generally unwell. This is not uncommon as chemotherapy can affect healthy cells as well as cancer cells. Report any side effects. Continue your course of treatment even though you feel ill unless your care team tells you to stop. This medication can cause serious allergic reactions. To reduce the risk, your care team may give you other medications to take before receiving this one. Be sure to follow the directions from your care team. This medication may increase your risk of getting an infection. Call your care team for advice if you get a fever, chills, sore throat, or other symptoms of a cold or flu. Do not treat yourself. Try to avoid  being around people who are sick. This medication may increase your risk to bruise or bleed. Call your care team if you notice any unusual bleeding. Be careful brushing or flossing your teeth or using a toothpick because you may get an infection or bleed more easily. If you have any dental work done, tell your dentist you are receiving this medication. Talk to your care team if you or your partner may be pregnant. Serious birth defects can occur if you take this medication during pregnancy and for 6 months after the last dose. You will need a negative pregnancy test before starting this medication. Contraception is recommended while taking this medication and for 6 months after the last dose. Your care team can help you find the option that works for you. If your partner can get pregnant, use a condom during sex while taking this medication and for 3 months after the last dose. Do not breastfeed while taking this medication and for 2 weeks after the last dose. This medication may cause infertility. Talk to your care team if you are concerned about your fertility. What side effects may I notice from receiving this medication? Side effects that you should report to your care team as soon as  possible: Allergic reactions--skin rash, itching, hives, swelling of the face, lips, tongue, or throat Dry cough, shortness of breath or trouble breathing Infection--fever, chills, cough, sore throat, wounds that don't heal, pain or trouble when passing urine, general feeling of discomfort or being unwell Low red blood cell level--unusual weakness or fatigue, dizziness, headache, trouble breathing Pain, tingling, or numbness in the hands or feet Stomach pain, unusual weakness or fatigue, nausea, vomiting, diarrhea, or fever that lasts longer than expected Unusual bruising or bleeding Side effects that usually do not require medical attention (report to your care team if they continue or are  bothersome): Diarrhea Fatigue Hair loss Loss of appetite Nausea Vomiting This list may not describe all possible side effects. Call your doctor for medical advice about side effects. You may report side effects to FDA at 1-800-FDA-1088. Where should I keep my medication? This medication is given in a hospital or clinic. It will not be stored at home. NOTE: This sheet is a summary. It may not cover all possible information. If you have questions about this medicine, talk to your doctor, pharmacist, or health care provider.  2024 Elsevier/Gold Standard (2022-02-08 00:00:00)  Gemcitabine Injection What is this medication? GEMCITABINE (jem SYE ta been) treats some types of cancer. It works by slowing down the growth of cancer cells. This medicine may be used for other purposes; ask your health care provider or pharmacist if you have questions. COMMON BRAND NAME(S): Gemzar, Infugem What should I tell my care team before I take this medication? They need to know if you have any of these conditions: Blood disorders Infection Kidney disease Liver disease Lung or breathing disease, such as asthma or COPD Recent or ongoing radiation therapy An unusual or allergic reaction to gemcitabine, other medications, foods, dyes, or preservatives If you or your partner are pregnant or trying to get pregnant Breast-feeding How should I use this medication? This medication is injected into a vein. It is given by your care team in a hospital or clinic setting. Talk to your care team about the use of this medication in children. Special care may be needed. Overdosage: If you think you have taken too much of this medicine contact a poison control center or emergency room at once. NOTE: This medicine is only for you. Do not share this medicine with others. What if I miss a dose? Keep appointments for follow-up doses. It is important not to miss your dose. Call your care team if you are unable to keep an  appointment. What may interact with this medication? Interactions have not been studied. This list may not describe all possible interactions. Give your health care provider a list of all the medicines, herbs, non-prescription drugs, or dietary supplements you use. Also tell them if you smoke, drink alcohol, or use illegal drugs. Some items may interact with your medicine. What should I watch for while using this medication? Your condition will be monitored carefully while you are receiving this medication. This medication may make you feel generally unwell. This is not uncommon, as chemotherapy can affect healthy cells as well as cancer cells. Report any side effects. Continue your course of treatment even though you feel ill unless your care team tells you to stop. In some cases, you may be given additional medications to help with side effects. Follow all directions for their use. This medication may increase your risk of getting an infection. Call your care team for advice if you get a fever, chills, sore throat, or  other symptoms of a cold or flu. Do not treat yourself. Try to avoid being around people who are sick. This medication may increase your risk to bruise or bleed. Call your care team if you notice any unusual bleeding. Be careful brushing or flossing your teeth or using a toothpick because you may get an infection or bleed more easily. If you have any dental work done, tell your dentist you are receiving this medication. Avoid taking medications that contain aspirin, acetaminophen, ibuprofen, naproxen, or ketoprofen unless instructed by your care team. These medications may hide a fever. Talk to your care team if you or your partner wish to become pregnant or think you might be pregnant. This medication can cause serious birth defects if taken during pregnancy and for 6 months after the last dose. A negative pregnancy test is required before starting this medication. A reliable form of  contraception is recommended while taking this medication and for 6 months after the last dose. Talk to your care team about effective forms of contraception. Do not father a child while taking this medication and for 3 months after the last dose. Use a condom while having sex during this time period. Do not breastfeed while taking this medication and for at least 1 week after the last dose. This medication may cause infertility. Talk to your care team if you are concerned about your fertility. What side effects may I notice from receiving this medication? Side effects that you should report to your care team as soon as possible: Allergic reactions--skin rash, itching, hives, swelling of the face, lips, tongue, or throat Capillary leak syndrome--stomach or muscle pain, unusual weakness or fatigue, feeling faint or lightheaded, decrease in the amount of urine, swelling of the ankles, hands, or feet, trouble breathing Infection--fever, chills, cough, sore throat, wounds that don't heal, pain or trouble when passing urine, general feeling of discomfort or being unwell Liver injury--right upper belly pain, loss of appetite, nausea, light-colored stool, dark yellow or brown urine, yellowing skin or eyes, unusual weakness or fatigue Low red blood cell level--unusual weakness or fatigue, dizziness, headache, trouble breathing Lung injury--shortness of breath or trouble breathing, cough, spitting up blood, chest pain, fever Stomach pain, bloody diarrhea, pale skin, unusual weakness or fatigue, decrease in the amount of urine, which may be signs of hemolytic uremic syndrome Sudden and severe headache, confusion, change in vision, seizures, which may be signs of posterior reversible encephalopathy syndrome (PRES) Unusual bruising or bleeding Side effects that usually do not require medical attention (report to your care team if they continue or are bothersome): Diarrhea Drowsiness Hair loss Nausea Pain,  redness, or swelling with sores inside the mouth or throat Vomiting This list may not describe all possible side effects. Call your doctor for medical advice about side effects. You may report side effects to FDA at 1-800-FDA-1088. Where should I keep my medication? This medication is given in a hospital or clinic. It will not be stored at home. NOTE: This sheet is a summary. It may not cover all possible information. If you have questions about this medicine, talk to your doctor, pharmacist, or health care provider.  2024 Elsevier/Gold Standard (2022-01-30 00:00:00)

## 2024-03-28 ENCOUNTER — Other Ambulatory Visit: Payer: Self-pay

## 2024-03-30 ENCOUNTER — Other Ambulatory Visit: Payer: Self-pay | Admitting: *Deleted

## 2024-03-30 ENCOUNTER — Encounter: Payer: Self-pay | Admitting: Oncology

## 2024-03-30 ENCOUNTER — Other Ambulatory Visit (HOSPITAL_BASED_OUTPATIENT_CLINIC_OR_DEPARTMENT_OTHER): Payer: Self-pay

## 2024-03-30 MED ORDER — LORAZEPAM 0.5 MG PO TABS: 0.5000 mg | ORAL_TABLET | Freq: Three times a day (TID) | ORAL | 1 refills | Status: DC | PRN

## 2024-03-30 MED ORDER — ENOXAPARIN SODIUM 150 MG/ML IJ SOSY
150.0000 mg | PREFILLED_SYRINGE | INTRAMUSCULAR | 0 refills | Status: DC
  Filled 2024-03-30: qty 10, 10d supply, fill #0
  Filled 2024-03-31: qty 3, 3d supply, fill #0
  Filled 2024-03-31: qty 7, 7d supply, fill #0

## 2024-03-31 ENCOUNTER — Other Ambulatory Visit (HOSPITAL_BASED_OUTPATIENT_CLINIC_OR_DEPARTMENT_OTHER): Payer: Self-pay

## 2024-03-31 ENCOUNTER — Other Ambulatory Visit (HOSPITAL_COMMUNITY): Payer: Self-pay

## 2024-04-01 ENCOUNTER — Other Ambulatory Visit: Payer: Self-pay | Admitting: *Deleted

## 2024-04-01 MED ORDER — DEXAMETHASONE 4 MG PO TABS: 4.0000 mg | ORAL_TABLET | Freq: Two times a day (BID) | ORAL | 0 refills | Status: DC

## 2024-04-01 NOTE — Progress Notes (Signed)
 MD notified of worsening nausea  and he declines to take ativan . Try dexamethasone  4 mg bid x 2 days per Dr. Cloretta.

## 2024-04-02 ENCOUNTER — Other Ambulatory Visit (HOSPITAL_BASED_OUTPATIENT_CLINIC_OR_DEPARTMENT_OTHER): Payer: Self-pay

## 2024-04-09 ENCOUNTER — Inpatient Hospital Stay (HOSPITAL_BASED_OUTPATIENT_CLINIC_OR_DEPARTMENT_OTHER): Admitting: Oncology

## 2024-04-09 ENCOUNTER — Inpatient Hospital Stay: Attending: Oncology

## 2024-04-09 ENCOUNTER — Inpatient Hospital Stay

## 2024-04-09 VITALS — BP 138/78 | HR 73 | Temp 97.9°F | Resp 18

## 2024-04-09 VITALS — BP 126/70 | HR 80 | Temp 97.9°F | Resp 18 | Ht 66.0 in | Wt 229.3 lb

## 2024-04-09 DIAGNOSIS — Z5111 Encounter for antineoplastic chemotherapy: Secondary | ICD-10-CM | POA: Insufficient documentation

## 2024-04-09 DIAGNOSIS — C250 Malignant neoplasm of head of pancreas: Secondary | ICD-10-CM

## 2024-04-09 DIAGNOSIS — C770 Secondary and unspecified malignant neoplasm of lymph nodes of head, face and neck: Secondary | ICD-10-CM | POA: Insufficient documentation

## 2024-04-09 LAB — CBC WITH DIFFERENTIAL (CANCER CENTER ONLY)
Abs Immature Granulocytes: 0.15 10*3/uL — ABNORMAL HIGH (ref 0.00–0.07)
Basophils Absolute: 0.1 10*3/uL (ref 0.0–0.1)
Basophils Relative: 1 %
Eosinophils Absolute: 0.2 10*3/uL (ref 0.0–0.5)
Eosinophils Relative: 4 %
HCT: 37.4 % — ABNORMAL LOW (ref 39.0–52.0)
Hemoglobin: 12.6 g/dL — ABNORMAL LOW (ref 13.0–17.0)
Immature Granulocytes: 3 %
Lymphocytes Relative: 23 %
Lymphs Abs: 1.3 10*3/uL (ref 0.7–4.0)
MCH: 33.1 pg (ref 26.0–34.0)
MCHC: 33.7 g/dL (ref 30.0–36.0)
MCV: 98.2 fL (ref 80.0–100.0)
Monocytes Absolute: 0.7 10*3/uL (ref 0.1–1.0)
Monocytes Relative: 12 %
Neutro Abs: 3.1 10*3/uL (ref 1.7–7.7)
Neutrophils Relative %: 57 %
Platelet Count: 187 10*3/uL (ref 150–400)
RBC: 3.81 MIL/uL — ABNORMAL LOW (ref 4.22–5.81)
RDW: 16.5 % — ABNORMAL HIGH (ref 11.5–15.5)
WBC Count: 5.4 10*3/uL (ref 4.0–10.5)
nRBC: 0 % (ref 0.0–0.2)

## 2024-04-09 LAB — CMP (CANCER CENTER ONLY)
ALT: 47 U/L — ABNORMAL HIGH (ref 0–44)
AST: 31 U/L (ref 15–41)
Albumin: 3.7 g/dL (ref 3.5–5.0)
Alkaline Phosphatase: 55 U/L (ref 38–126)
Anion gap: 12 (ref 5–15)
BUN: 17 mg/dL (ref 8–23)
CO2: 21 mmol/L — ABNORMAL LOW (ref 22–32)
Calcium: 9.4 mg/dL (ref 8.9–10.3)
Chloride: 107 mmol/L (ref 98–111)
Creatinine: 0.98 mg/dL (ref 0.61–1.24)
GFR, Estimated: >60 mL/min (ref >60)
Glucose, Bld: 156 mg/dL — ABNORMAL HIGH (ref 70–99)
Potassium: 4 mmol/L (ref 3.5–5.1)
Sodium: 139 mmol/L (ref 135–145)
Total Bilirubin: 0.2 mg/dL (ref 0.0–1.2)
Total Protein: 6.4 g/dL — ABNORMAL LOW (ref 6.5–8.1)

## 2024-04-09 MED ORDER — HEPARIN SOD (PORK) LOCK FLUSH 100 UNIT/ML IV SOLN
500.0000 [IU] | Freq: Once | INTRAVENOUS | Status: AC | PRN
  Administered 2024-04-09: 500 [IU]

## 2024-04-09 MED ORDER — SODIUM CHLORIDE 0.9 % IV SOLN: INTRAVENOUS | Status: DC

## 2024-04-09 MED ORDER — SODIUM CHLORIDE 0.9 % IV SOLN
2200.0000 mg | Freq: Once | INTRAVENOUS | Status: AC
  Administered 2024-04-09: 2200 mg via INTRAVENOUS
  Filled 2024-04-09: qty 52.57

## 2024-04-09 MED ORDER — METOCLOPRAMIDE HCL 10 MG PO TABS: 10.0000 mg | ORAL_TABLET | Freq: Three times a day (TID) | ORAL | 1 refills | Status: DC

## 2024-04-09 MED ORDER — DEXAMETHASONE 4 MG PO TABS
8.0000 mg | ORAL_TABLET | Freq: Once | ORAL | Status: AC
  Administered 2024-04-09: 8 mg via ORAL
  Filled 2024-04-09: qty 2

## 2024-04-09 MED ORDER — PACLITAXEL PROTEIN-BOUND CHEMO INJECTION 100 MG
100.0000 mg/m2 | Freq: Once | INTRAVENOUS | Status: AC
  Administered 2024-04-09: 225 mg via INTRAVENOUS
  Filled 2024-04-09: qty 45

## 2024-04-09 MED ORDER — SODIUM CHLORIDE 0.9% FLUSH
10.0000 mL | INTRAVENOUS | Status: DC | PRN
  Administered 2024-04-09: 10 mL

## 2024-04-09 NOTE — Progress Notes (Signed)
 Patient seen by Dr. Thornton Papas today  Vitals are within treatment parameters:Yes   Labs are within treatment parameters: Yes   Treatment plan has been signed: Yes   Per physician team, Patient is ready for treatment and there are NO modifications to the treatment plan.

## 2024-04-09 NOTE — Progress Notes (Signed)
 [facility] OFFICE PROGRESS NOTE   Diagnosis: Pancreas Wilcox  INTERVAL HISTORY:   Timothy Wilcox returns as scheduled.  He completed another cycle of gemcitabine /Abraxane  on 03/27/2024.  No fever or rash.  He reports intermittent nausea and malaise following therapy.  The nausea is not improved with lorazepam .  Zofran  causes constipation. He took Compazine  this morning and does not wish to take Compazine  again today. No new neurologic symptoms.  No pain.  He continues anticoagulation therapy. Objective:  Vital signs in last 24 hours:  Blood pressure 126/70, pulse 80, temperature 97.9 F (36.6 C), temperature source Temporal, resp. rate 18, height 5' 6 (1.676 m), weight 229 lb 4.8 oz (104 kg), SpO2 100%.    HEENT: No thrush or ulcers Resp: Lungs clear bilaterally Cardio: Regular rate and rhythm GI: No hepatosplenomegaly, no mass, nontender Vascular: No leg edema  Skin: Ecchymoses over the abdominal wall  Portacath/PICC-without erythema  Lab Results:  Lab Results  Component Value Date   WBC 5.4 04/09/2024   HGB 12.6 (L) 04/09/2024   HCT 37.4 (L) 04/09/2024   MCV 98.2 04/09/2024   PLT 187 04/09/2024   NEUTROABS 3.1 04/09/2024    CMP  Lab Results  Component Value Date   NA 137 03/27/2024   K 4.2 03/27/2024   CL 105 03/27/2024   CO2 22 03/27/2024   GLUCOSE 177 (H) 03/27/2024   BUN 11 03/27/2024   CREATININE 0.93 03/27/2024   CALCIUM  9.2 03/27/2024   PROT 6.2 (L) 03/27/2024   ALBUMIN  3.6 03/27/2024   AST 23 03/27/2024   ALT 34 03/27/2024   ALKPHOS 58 03/27/2024   BILITOT 0.2 03/27/2024   GFRNONAA >60 03/27/2024   GFRAA >60 11/08/2018    Lab Results  Component Value Date   RJW800 2,362 (H) 02/27/2024     Medications: I have reviewed the patient's current medications.   Assessment/Plan: Pancreas Wilcox 11/19/2022: 2.9 cm thin-walled cystic lesion in the pancreas uncinate 07/28/2023: CT abdomen/pelvis: Stable 2.8 cm cystic area in the  uncinate process 11/29/2023: CT chest-subsegmental and segmental pulmonary emboli in the left upper lobe, dilation of the right ventricle, multiple pulmonary nodules 12/26/2023: CA 19-9- 66,286 12/27/2023: PET-5 cm hypermetabolic pancreas head/uncinate mass, numerous hypermetabolic pulmonary nodules, hypermetabolic left supraclavicular and retroperitoneal lymph nodes 01/06/2024: Left supraclavicular lymph node biopsy-metastatic moderately differentiated adenocarcinoma, 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, upper GI, breast, and salivary gland carcinoma Foundation 1 01/06/2024-HRDsig negative, microsatellite stable, tumor mutation burden 2, K-ras G12R Cycle 1 gemcitabine /Abraxane  01/16/2024 Cycle 2 gemcitabine /Abraxane  01/30/2024 Cycle 3 gemcitabine /Abraxane  02/13/2024 Cycle 4 gemcitabine /Abraxane  02/27/2024 Cycle 5 gemcitabine /Abraxane  03/12/2024 03/23/2024 CTs: Decrease size of pancreas head/uncinate mass, mixed response involving bilateral pulmonary nodules, decreased left supraclavicular retroperitoneal lymph nodes Cycle 6 gemcitabine /Abraxane  03/27/2024 Cycle 7 gemcitabine /Abraxane  04/09/2024 Pulmonary embolism 11/29/2023 Small segmental and subsegmental pulmonary emboli in the left upper lobe, dilation of right ventricle Admitted for heparin  anticoagulation followed by apixaban -discharged 12/01/2023 Lower extremity Dopplers 11/30/2023: Acute left posterior tibial, peroneal, and TP trunk DVTs, acute right peroneal DVT Lovenox  12/31/2023   3.  CVAs-visual disturbance and altered mental status 12/30/2023 MRI brain-many foci of restricted diffusion in the cerebellum, left frontal, left basal ganglia, and parietal lobes compatible with acute/subacute infarcts, consider embolic etiology 4.  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  Kidney stones 6.  Gout 7.  Family history of pancreas Wilcox 8.  History of basal cell carcinoma 9.  Negative genetic testing with VUS in  EGFR.  First-degree relatives are candidates  for considering pancreatic Wilcox screening based on family history.  Discussed with patient and wife 01/30/2024.       Disposition: Mr Wilcox has metastatic pancreas Wilcox.  He began treatment with gemcitabine /Abraxane  in April.  His overall clinical status has improved since beginning chemotherapy.  He will complete another cycle of gemcitabine /Abraxane  today.  We will follow-up on the CA 19-9 from today. Timothy Wilcox reports persistent nausea.  He does not wish to take Zofran , Compazine , or lorazepam  due to lack of benefit and toxicities.  He will receive Decadron  prior to chemotherapy today.  He will begin a trial of Reglan  and Prilosec.  Timothy Wilcox will return for an office visit and chemotherapy in 2 weeks.  We will discuss converting to an oral anticoagulant.    Timothy Hof, MD  04/09/2024  10:42 AM

## 2024-04-09 NOTE — Patient Instructions (Addendum)
 Your script for metoclopramide  (Reglan ) has been sent to [facility]. You may also start Prilosec OTC 20 mg daily (over the counter)   Metoclopramide  Tablets What is this medication? METOCLOPRAMIDE  (met oh kloe PRA mide) treats reflux disease. It is prescribed when other medications have not worked. It may also be used to treat slow emptying of the digestive tract. It works by helping the muscles in your digestive tract move food. This empties your digestive tract, which relieves symptoms such as fullness, nausea, and heartburn. This medicine may be used for other purposes; ask your health care provider or pharmacist if you have questions. COMMON BRAND NAME(S): Reglan  What should I tell my care team before I take this medication? They need to know if you have any of these conditions: Breast cancer Depression Diabetes Frequently drink alcohol Heart failure High blood pressure Kidney disease Liver disease Parkinson's disease or a movement disorder Pheochromocytoma Seizures Stomach obstruction, bleeding, or perforation An unusual or allergic reaction to metoclopramide , procainamide, other medications, foods, dyes, or preservatives Pregnant or trying to get pregnant Breast-feeding How should I use this medication? Take this medication by mouth with a glass of water. Follow the directions on the prescription label. Take this medication on an empty stomach, about 30 minutes before eating. Take your doses at regular intervals. Do not take your medication more often than directed. Do not stop taking except on the advice of your care team. A special MedGuide will be given to you by the pharmacist with each prescription and refill. Be sure to read this information carefully each time. Talk to your care team about the use of this medication in children. Special care may be needed. Overdosage: If you think you have taken too much of this medicine contact a poison control center or emergency room  at once. NOTE: This medicine is only for you. Do not share this medicine with others. What if I miss a dose? If you miss a dose, skip it. Take your next dose at the normal time. Do not take extra or 2 doses at the same time to make up for the missed dose. What may interact with this medication? Alcohol Antihistamines for allergy, cough, and cold Atovaquone Atropine Bupropion Certain medications for anxiety or sleep Certain medications for bladder problems, such as oxybutynin, tolterodine Certain medications for depression or mental health conditions Certain medications for Parkinson's disease Certain medications for seizures, such as phenobarbital, primidone Certain medications for stomach problems, such as dicyclomine, hyoscyamine Certain medications for travel sickness, such as scopolamine Cyclosporine Digoxin Fosfomycin General anesthetics, such as halothane, isoflurane, methoxyflurane, propofol  Insulin  and other medications for diabetes Ipratropium MAOIs, such as Carbex, Eldepryl, Marplan, Nardil, and Parnate Medications that relax muscles for surgery Opioid medications for pain Paroxetine Phenothiazines, such as chlorpromazine, mesoridazine, prochlorperazine , thioridazine Posaconazole Quinidine Sirolimus Tacrolimus This list may not describe all possible interactions. Give your health care provider a list of all the medicines, herbs, non-prescription drugs, or dietary supplements you use. Also tell them if you smoke, drink alcohol, or use illegal drugs. Some items may interact with your medicine. What should I watch for while using this medication? It may take a few weeks for your stomach condition to start to get better. However, do not take this medication for longer than 12 weeks. The longer you take this medication, and the more you take it, the greater your chances are of developing serious side effects. If you are over 53 years of age, a male patient, or you have  diabetes, you may be at an increased risk for side effects from this medication. Contact your care team immediately if you start having movements you cannot control such as lip smacking, rapid movements of the tongue, involuntary or uncontrollable movements of the eyes, head, arms and legs, or muscle twitches and spasms. Patients and their families should watch out for worsening depression or thoughts of suicide. Also watch out for any sudden or severe changes in feelings such as feeling anxious, agitated, panicky, irritable, hostile, aggressive, impulsive, severely restless, overly excited and hyperactive, or not being able to sleep. If this happens, especially at the beginning of treatment or after a change in dose, call your care team. Do not treat yourself for high fever. Ask your care team for advice. You may get drowsy or dizzy. Do not drive, use machinery, or do anything that needs mental alertness until you know how this medication affects you. Do not stand or sit up quickly, especially if you are over 64 years of age. This reduces the risk of dizzy or fainting spells. Alcohol can make you more drowsy and dizzy. Avoid alcoholic drinks. What side effects may I notice from receiving this medication? Side effects that you should report to your care team as soon as possible: Allergic reactions--skin rash, itching, hives, swelling of the face, lips, tongue, or throat High fever, stiff muscles, increased sweating, fast or irregular heartbeat, and confusion, which may be signs of neuroleptic malignant syndrome High prolactin level--unexpected breast tissue growth, discharge from the nipple, change in sex drive or performance, irregular menstrual cycle Increase in blood pressure Swelling of the ankles, hands, or feet Thoughts of suicide or self-harm, worsening mood, feelings of depression Uncontrolled and repetitive body movements, muscle stiffness or spasms, tremors or shaking, loss of balance or  coordination, restlessness, shuffling walk, which may be signs of extrapyramidal symptoms (EPS) Side effects that usually do not require medical attention (report to your care team if they continue or are bothersome): Dizziness Drowsiness Fatigue Headache Trouble sleeping This list may not describe all possible side effects. Call your doctor for medical advice about side effects. You may report side effects to FDA at 1-800-FDA-1088. Where should I keep my medication? Keep out of the reach of children and pets. Store at room temperature between 20 and 25 degrees C (68 and 77 degrees F). Protect from light. Keep container tightly closed. Throw away any unused medication after the expiration date. NOTE: This sheet is a summary. It may not cover all possible information. If you have questions about this medicine, talk to your doctor, pharmacist, or health care provider.  2024 Elsevier/Gold Standard (2021-09-14 00:00:00) [facility] - A DEPT OF [facility]   Discharge Instructions: Thank you for choosing [facility] to provide your oncology and hematology care.   If you have a lab appointment with the Cancer Center, please go directly to the Cancer Center and check in at the registration area.   Wear comfortable clothing and clothing appropriate for easy access to any Portacath or PICC line.   We strive to give you quality time with your provider. You may need to reschedule your appointment if you arrive late (15 or more minutes).  Arriving late affects you and other patients whose appointments are after yours.  Also, if you miss three or more appointments without notifying the office, you may be dismissed from the clinic at the provider's discretion.      For prescription refill  requests, have your pharmacy contact our office and allow 72 hours for refills to be completed.    Today you received the following chemotherapy and/or immunotherapy agents  Paclitaxel -protein bound  (ABRAXANE ) & Gemcitabine  (GEMZAR ).      To help prevent nausea and vomiting after your treatment, we encourage you to take your nausea medication as directed.  BELOW ARE SYMPTOMS THAT SHOULD BE REPORTED IMMEDIATELY: *FEVER GREATER THAN 100.4 F (38 C) OR HIGHER *CHILLS OR SWEATING *NAUSEA AND VOMITING THAT IS NOT CONTROLLED WITH YOUR NAUSEA MEDICATION *UNUSUAL SHORTNESS OF BREATH *UNUSUAL BRUISING OR BLEEDING *URINARY PROBLEMS (pain or burning when urinating, or frequent urination) *BOWEL PROBLEMS (unusual diarrhea, constipation, pain near the anus) TENDERNESS IN MOUTH AND THROAT WITH OR WITHOUT PRESENCE OF ULCERS (sore throat, sores in mouth, or a toothache) UNUSUAL RASH, SWELLING OR PAIN  UNUSUAL VAGINAL DISCHARGE OR ITCHING   Items with * indicate a potential emergency and should be followed up as soon as possible or go to the Emergency Department if any problems should occur.  Please show the CHEMOTHERAPY ALERT CARD or IMMUNOTHERAPY ALERT CARD at check-in to the Emergency Department and triage nurse.  Should you have questions after your visit or need to cancel or reschedule your appointment, please contact [facility] - A DEPT OF [facility]  Dept: [phone]  and follow the prompts.  Office hours are 8:00 a.m. to 4:30 p.m. Monday - Friday. Please note that voicemails left after 4:00 p.m. may not be returned until the following business day.  We are closed weekends and major holidays. You have access to a nurse at all times for urgent questions. Please call the main number to the clinic Dept: [phone] and follow the prompts.   For any non-urgent questions, you may also contact your provider using MyChart. We now offer e-Visits for anyone 83 and older to request care online for non-urgent symptoms. For details visit [email or web address].   Also download the MyChart app! Go to the app store, search MyChart, open the  app, select [facility], and log in with your MyChart username and password.  Paclitaxel  Nanoparticle Albumin -Bound Injection What is this medication? NANOPARTICLE ALBUMIN -BOUND PACLITAXEL  (Na no PAHR ti kuhl al BYOO muhn-bound PAK li TAX el) treats some types of cancer. It works by slowing down the growth of cancer cells. This medicine may be used for other purposes; ask your health care provider or pharmacist if you have questions. COMMON BRAND NAME(S): Abraxane  What should I tell my care team before I take this medication? They need to know if you have any of these conditions: Liver disease Low white blood cell levels An unusual or allergic reaction to paclitaxel , albumin , other medications, foods, dyes, or preservatives If you or your partner are pregnant or trying to get pregnant Breast-feeding How should I use this medication? This medication is injected into a vein. It is given by your care team in a hospital or clinic setting. Talk to your care team about the use of this medication in children. Special care may be needed. Overdosage: If you think you have taken too much of this medicine contact a poison control center or emergency room at once. NOTE: This medicine is only for you. Do not share this medicine with others. What if I miss a dose? Keep appointments for follow-up doses. It is important not to miss your dose. Call your care team if you are unable to keep an appointment. What may interact with this medication?  Other medications may affect the way this medication works. Talk with your care team about all of the medications you take. They may suggest changes to your treatment plan to lower the risk of side effects and to make sure your medications work as intended. This list may not describe all possible interactions. Give your health care provider a list of all the medicines, herbs, non-prescription drugs, or dietary supplements you use. Also tell them if you smoke, drink  alcohol, or use illegal drugs. Some items may interact with your medicine. What should I watch for while using this medication? Your condition will be monitored carefully while you are receiving this medication. You may need blood work while taking this medication. This medication may make you feel generally unwell. This is not uncommon as chemotherapy can affect healthy cells as well as cancer cells. Report any side effects. Continue your course of treatment even though you feel ill unless your care team tells you to stop. This medication can cause serious allergic reactions. To reduce the risk, your care team may give you other medications to take before receiving this one. Be sure to follow the directions from your care team. This medication may increase your risk of getting an infection. Call your care team for advice if you get a fever, chills, sore throat, or other symptoms of a cold or flu. Do not treat yourself. Try to avoid being around people who are sick. This medication may increase your risk to bruise or bleed. Call your care team if you notice any unusual bleeding. Be careful brushing or flossing your teeth or using a toothpick because you may get an infection or bleed more easily. If you have any dental work done, tell your dentist you are receiving this medication. Talk to your care team if you or your partner may be pregnant. Serious birth defects can occur if you take this medication during pregnancy and for 6 months after the last dose. You will need a negative pregnancy test before starting this medication. Contraception is recommended while taking this medication and for 6 months after the last dose. Your care team can help you find the option that works for you. If your partner can get pregnant, use a condom during sex while taking this medication and for 3 months after the last dose. Do not breastfeed while taking this medication and for 2 weeks after the last dose. This medication  may cause infertility. Talk to your care team if you are concerned about your fertility. What side effects may I notice from receiving this medication? Side effects that you should report to your care team as soon as possible: Allergic reactions--skin rash, itching, hives, swelling of the face, lips, tongue, or throat Dry cough, shortness of breath or trouble breathing Infection--fever, chills, cough, sore throat, wounds that don't heal, pain or trouble when passing urine, general feeling of discomfort or being unwell Low red blood cell level--unusual weakness or fatigue, dizziness, headache, trouble breathing Pain, tingling, or numbness in the hands or feet Stomach pain, unusual weakness or fatigue, nausea, vomiting, diarrhea, or fever that lasts longer than expected Unusual bruising or bleeding Side effects that usually do not require medical attention (report to your care team if they continue or are bothersome): Diarrhea Fatigue Hair loss Loss of appetite Nausea Vomiting This list may not describe all possible side effects. Call your doctor for medical advice about side effects. You may report side effects to FDA at 1-800-FDA-1088. Where should I keep my  medication? This medication is given in a hospital or clinic. It will not be stored at home. NOTE: This sheet is a summary. It may not cover all possible information. If you have questions about this medicine, talk to your doctor, pharmacist, or health care provider.  2024 Elsevier/Gold Standard (2022-02-08 00:00:00)  Gemcitabine  Injection What is this medication? GEMCITABINE  (jem SYE ta been) treats some types of cancer. It works by slowing down the growth of cancer cells. This medicine may be used for other purposes; ask your health care provider or pharmacist if you have questions. COMMON BRAND NAME(S): Gemzar , Infugem  What should I tell my care team before I take this medication? They need to know if you have any of these  conditions: Blood disorders Infection Kidney disease Liver disease Lung or breathing disease, such as asthma or COPD Recent or ongoing radiation therapy An unusual or allergic reaction to gemcitabine , other medications, foods, dyes, or preservatives If you or your partner are pregnant or trying to get pregnant Breast-feeding How should I use this medication? This medication is injected into a vein. It is given by your care team in a hospital or clinic setting. Talk to your care team about the use of this medication in children. Special care may be needed. Overdosage: If you think you have taken too much of this medicine contact a poison control center or emergency room at once. NOTE: This medicine is only for you. Do not share this medicine with others. What if I miss a dose? Keep appointments for follow-up doses. It is important not to miss your dose. Call your care team if you are unable to keep an appointment. What may interact with this medication? Interactions have not been studied. This list may not describe all possible interactions. Give your health care provider a list of all the medicines, herbs, non-prescription drugs, or dietary supplements you use. Also tell them if you smoke, drink alcohol, or use illegal drugs. Some items may interact with your medicine. What should I watch for while using this medication? Your condition will be monitored carefully while you are receiving this medication. This medication may make you feel generally unwell. This is not uncommon, as chemotherapy can affect healthy cells as well as cancer cells. Report any side effects. Continue your course of treatment even though you feel ill unless your care team tells you to stop. In some cases, you may be given additional medications to help with side effects. Follow all directions for their use. This medication may increase your risk of getting an infection. Call your care team for advice if you get a fever,  chills, sore throat, or other symptoms of a cold or flu. Do not treat yourself. Try to avoid being around people who are sick. This medication may increase your risk to bruise or bleed. Call your care team if you notice any unusual bleeding. Be careful brushing or flossing your teeth or using a toothpick because you may get an infection or bleed more easily. If you have any dental work done, tell your dentist you are receiving this medication. Avoid taking medications that contain aspirin , acetaminophen , ibuprofen , naproxen, or ketoprofen unless instructed by your care team. These medications may hide a fever. Talk to your care team if you or your partner wish to become pregnant or think you might be pregnant. This medication can cause serious birth defects if taken during pregnancy and for 6 months after the last dose. A negative pregnancy test is required before starting  this medication. A reliable form of contraception is recommended while taking this medication and for 6 months after the last dose. Talk to your care team about effective forms of contraception. Do not father a child while taking this medication and for 3 months after the last dose. Use a condom while having sex during this time period. Do not breastfeed while taking this medication and for at least 1 week after the last dose. This medication may cause infertility. Talk to your care team if you are concerned about your fertility. What side effects may I notice from receiving this medication? Side effects that you should report to your care team as soon as possible: Allergic reactions--skin rash, itching, hives, swelling of the face, lips, tongue, or throat Capillary leak syndrome--stomach or muscle pain, unusual weakness or fatigue, feeling faint or lightheaded, decrease in the amount of urine, swelling of the ankles, hands, or feet, trouble breathing Infection--fever, chills, cough, sore throat, wounds that don't heal, pain or trouble  when passing urine, general feeling of discomfort or being unwell Liver injury--right upper belly pain, loss of appetite, nausea, light-colored stool, dark yellow or brown urine, yellowing skin or eyes, unusual weakness or fatigue Low red blood cell level--unusual weakness or fatigue, dizziness, headache, trouble breathing Lung injury--shortness of breath or trouble breathing, cough, spitting up blood, chest pain, fever Stomach pain, bloody diarrhea, pale skin, unusual weakness or fatigue, decrease in the amount of urine, which may be signs of hemolytic uremic syndrome Sudden and severe headache, confusion, change in vision, seizures, which may be signs of posterior reversible encephalopathy syndrome (PRES) Unusual bruising or bleeding Side effects that usually do not require medical attention (report to your care team if they continue or are bothersome): Diarrhea Drowsiness Hair loss Nausea Pain, redness, or swelling with sores inside the mouth or throat Vomiting This list may not describe all possible side effects. Call your doctor for medical advice about side effects. You may report side effects to FDA at 1-800-FDA-1088. Where should I keep my medication? This medication is given in a hospital or clinic. It will not be stored at home. NOTE: This sheet is a summary. It may not cover all possible information. If you have questions about this medicine, talk to your doctor, pharmacist, or health care provider.  2024 Elsevier/Gold Standard (2022-01-30 00:00:00)

## 2024-04-10 ENCOUNTER — Other Ambulatory Visit: Payer: Self-pay

## 2024-04-10 ENCOUNTER — Encounter: Payer: Self-pay | Admitting: Oncology

## 2024-04-10 LAB — CANCER ANTIGEN 19-9: CA 19-9: 456 U/mL — ABNORMAL HIGH (ref 0–35)

## 2024-04-14 ENCOUNTER — Other Ambulatory Visit: Payer: Self-pay | Admitting: *Deleted

## 2024-04-14 ENCOUNTER — Other Ambulatory Visit: Payer: Self-pay

## 2024-04-14 NOTE — Progress Notes (Signed)
 Mr. Poehlman reports he is willing take the risk getting back on eliquis  5 mg bid and will d/c Lovenox . Chart updated.

## 2024-04-19 ENCOUNTER — Other Ambulatory Visit: Payer: Self-pay | Admitting: Oncology

## 2024-04-23 ENCOUNTER — Encounter: Payer: Self-pay | Admitting: Nurse Practitioner

## 2024-04-23 ENCOUNTER — Inpatient Hospital Stay

## 2024-04-23 ENCOUNTER — Inpatient Hospital Stay (HOSPITAL_BASED_OUTPATIENT_CLINIC_OR_DEPARTMENT_OTHER): Admitting: Nurse Practitioner

## 2024-04-23 VITALS — BP 129/71 | HR 87 | Temp 97.6°F | Resp 18 | Ht 66.0 in | Wt 229.6 lb

## 2024-04-23 VITALS — BP 136/72 | HR 72 | Temp 97.6°F | Resp 18

## 2024-04-23 DIAGNOSIS — Z5111 Encounter for antineoplastic chemotherapy: Secondary | ICD-10-CM | POA: Diagnosis not present

## 2024-04-23 DIAGNOSIS — C250 Malignant neoplasm of head of pancreas: Secondary | ICD-10-CM

## 2024-04-23 LAB — CBC WITH DIFFERENTIAL (CANCER CENTER ONLY)
Abs Immature Granulocytes: 0.07 K/uL (ref 0.00–0.07)
Basophils Absolute: 0.1 K/uL (ref 0.0–0.1)
Basophils Relative: 1 %
Eosinophils Absolute: 0.2 K/uL (ref 0.0–0.5)
Eosinophils Relative: 3 %
HCT: 37.5 % — ABNORMAL LOW (ref 39.0–52.0)
Hemoglobin: 12.7 g/dL — ABNORMAL LOW (ref 13.0–17.0)
Immature Granulocytes: 1 %
Lymphocytes Relative: 18 %
Lymphs Abs: 1.1 K/uL (ref 0.7–4.0)
MCH: 33.7 pg (ref 26.0–34.0)
MCHC: 33.9 g/dL (ref 30.0–36.0)
MCV: 99.5 fL (ref 80.0–100.0)
Monocytes Absolute: 0.9 K/uL (ref 0.1–1.0)
Monocytes Relative: 13 %
Neutro Abs: 4.1 K/uL (ref 1.7–7.7)
Neutrophils Relative %: 64 %
Platelet Count: 215 K/uL (ref 150–400)
RBC: 3.77 MIL/uL — ABNORMAL LOW (ref 4.22–5.81)
RDW: 16.3 % — ABNORMAL HIGH (ref 11.5–15.5)
WBC Count: 6.4 K/uL (ref 4.0–10.5)
nRBC: 0 % (ref 0.0–0.2)

## 2024-04-23 LAB — CMP (CANCER CENTER ONLY)
ALT: 32 U/L (ref 0–44)
AST: 25 U/L (ref 15–41)
Albumin: 3.9 g/dL (ref 3.5–5.0)
Alkaline Phosphatase: 60 U/L (ref 38–126)
Anion gap: 12 (ref 5–15)
BUN: 16 mg/dL (ref 8–23)
CO2: 23 mmol/L (ref 22–32)
Calcium: 9.5 mg/dL (ref 8.9–10.3)
Chloride: 103 mmol/L (ref 98–111)
Creatinine: 1.04 mg/dL (ref 0.61–1.24)
GFR, Estimated: >60 mL/min (ref >60)
Glucose, Bld: 208 mg/dL — ABNORMAL HIGH (ref 70–99)
Potassium: 4.1 mmol/L (ref 3.5–5.1)
Sodium: 137 mmol/L (ref 135–145)
Total Bilirubin: 0.3 mg/dL (ref 0.0–1.2)
Total Protein: 6.3 g/dL — ABNORMAL LOW (ref 6.5–8.1)

## 2024-04-23 MED ORDER — SODIUM CHLORIDE 0.9 % IV SOLN
INTRAVENOUS | Status: DC
Start: 2024-04-23 — End: 2024-04-23

## 2024-04-23 MED ORDER — PACLITAXEL PROTEIN-BOUND CHEMO INJECTION 100 MG
100.0000 mg/m2 | Freq: Once | INTRAVENOUS | Status: AC
  Administered 2024-04-23: 225 mg via INTRAVENOUS
  Filled 2024-04-23: qty 45

## 2024-04-23 MED ORDER — SODIUM CHLORIDE 0.9% FLUSH
10.0000 mL | INTRAVENOUS | Status: DC | PRN
  Administered 2024-04-23: 10 mL

## 2024-04-23 MED ORDER — SODIUM CHLORIDE 0.9 % IV SOLN
2200.0000 mg | Freq: Once | INTRAVENOUS | Status: AC
  Administered 2024-04-23: 2200 mg via INTRAVENOUS
  Filled 2024-04-23: qty 52.57

## 2024-04-23 MED ORDER — HEPARIN SOD (PORK) LOCK FLUSH 100 UNIT/ML IV SOLN
500.0000 [IU] | Freq: Once | INTRAVENOUS | Status: AC | PRN
  Administered 2024-04-23: 500 [IU]

## 2024-04-23 NOTE — Progress Notes (Signed)
 Patient took home dose of decadron  this morning prior to treatment so IV dose in treatment plan was removed

## 2024-04-23 NOTE — Progress Notes (Signed)
 Patient seen by Lonna Cobb NP today  Vitals are within treatment parameters:Yes   Labs are within treatment parameters: Yes   Treatment plan has been signed: Yes   Per physician team, Patient is ready for treatment and there are NO modifications to the treatment plan.

## 2024-04-23 NOTE — Patient Instructions (Signed)
 [facility] - A DEPT OF [facility]  Discharge Instructions: Thank you for choosing [facility] to provide your oncology and hematology care.   If you have a lab appointment with the Cancer Center, please go directly to the Cancer Center and check in at the registration area.   Wear comfortable clothing and clothing appropriate for easy access to any Portacath or PICC line.   We strive to give you quality time with your provider. You may need to reschedule your appointment if you arrive late (15 or more minutes).  Arriving late affects you and other patients whose appointments are after yours.  Also, if you miss three or more appointments without notifying the office, you may be dismissed from the clinic at the provider's discretion.      For prescription refill requests, have your pharmacy contact our office and allow 72 hours for refills to be completed.    Today you received the following chemotherapy and/or immunotherapy agents: Abraxane  & Gemzar       To help prevent nausea and vomiting after your treatment, we encourage you to take your nausea medication as directed.  BELOW ARE SYMPTOMS THAT SHOULD BE REPORTED IMMEDIATELY: *FEVER GREATER THAN 100.4 F (38 C) OR HIGHER *CHILLS OR SWEATING *NAUSEA AND VOMITING THAT IS NOT CONTROLLED WITH YOUR NAUSEA MEDICATION *UNUSUAL SHORTNESS OF BREATH *UNUSUAL BRUISING OR BLEEDING *URINARY PROBLEMS (pain or burning when urinating, or frequent urination) *BOWEL PROBLEMS (unusual diarrhea, constipation, pain near the anus) TENDERNESS IN MOUTH AND THROAT WITH OR WITHOUT PRESENCE OF ULCERS (sore throat, sores in mouth, or a toothache) UNUSUAL RASH, SWELLING OR PAIN  UNUSUAL VAGINAL DISCHARGE OR ITCHING   Items with * indicate a potential emergency and should be followed up as soon as possible or go to the Emergency Department if any problems should occur.  Please show the CHEMOTHERAPY ALERT CARD or  IMMUNOTHERAPY ALERT CARD at check-in to the Emergency Department and triage nurse.  Should you have questions after your visit or need to cancel or reschedule your appointment, please contact [facility] - A DEPT OF [facility]  Dept: [phone]  and follow the prompts.  Office hours are 8:00 a.m. to 4:30 p.m. Monday - Friday. Please note that voicemails left after 4:00 p.m. may not be returned until the following business day.  We are closed weekends and major holidays. You have access to a nurse at all times for urgent questions. Please call the main number to the clinic Dept: [phone] and follow the prompts.   For any non-urgent questions, you may also contact your provider using MyChart. We now offer e-Visits for anyone 15 and older to request care online for non-urgent symptoms. For details visit [email or web address].   Also download the MyChart app! Go to the app store, search MyChart, open the app, select [place], and log in with your MyChart username and password.

## 2024-04-23 NOTE — Progress Notes (Signed)
 [facility] OFFICE PROGRESS NOTE   Diagnosis: Pancreas cancer  INTERVAL HISTORY:   Mr. Nickson returns as scheduled.  He completed cycle 7 gemcitabine /Abraxane  04/09/2024.  He continues to have intermittent dizziness/lightheadedness/nausea.  He has mild nausea at present.  No mouth sores.  No diarrhea.  No numbness or tingling in his hands or feet.  No fever after treatment.  He occasionally sees a rash at the upper abdomen.  Objective:  Vital signs in last 24 hours:  Blood pressure 129/71, pulse 87, temperature 97.6 F (36.4 C), temperature source Temporal, resp. rate 18, height 5' 6 (1.676 m), weight 229 lb 9.6 oz (104.1 kg), SpO2 100%.    HEENT: No thrush or ulcers. Resp: Lungs clear bilaterally. Cardio: Regular rate and rhythm. GI: Abdomen soft and nontender.  No hepatosplenomegaly.  No mass. Vascular: Trace lower leg edema bilaterally. Skin: No rash. Port-A-Cath without erythema.  Lab Results:  Lab Results  Component Value Date   WBC 6.4 04/23/2024   HGB 12.7 (L) 04/23/2024   HCT 37.5 (L) 04/23/2024   MCV 99.5 04/23/2024   PLT 215 04/23/2024   NEUTROABS 4.1 04/23/2024    Imaging:  No results found.  Medications: I have reviewed the patient's current medications.  Assessment/Plan: Pancreas cancer 11/19/2022: 2.9 cm thin-walled cystic lesion in the pancreas uncinate 07/28/2023: CT abdomen/pelvis: Stable 2.8 cm cystic area in the uncinate process 11/29/2023: CT chest-subsegmental and segmental pulmonary emboli in the left upper lobe, dilation of the right ventricle, multiple pulmonary nodules 12/26/2023: CA 19-9- 66,286 12/27/2023: PET-5 cm hypermetabolic pancreas head/uncinate mass, numerous hypermetabolic pulmonary nodules, hypermetabolic left supraclavicular and retroperitoneal lymph nodes 01/06/2024: Left supraclavicular lymph node biopsy-metastatic moderately differentiated adenocarcinoma, 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, upper GI, breast, and salivary gland carcinoma Foundation 1 01/06/2024-HRDsig negative, microsatellite stable, tumor mutation burden 2, K-ras G12R Cycle 1 gemcitabine /Abraxane  01/16/2024 Cycle 2 gemcitabine /Abraxane  01/30/2024 Cycle 3 gemcitabine /Abraxane  02/13/2024 Cycle 4 gemcitabine /Abraxane  02/27/2024 Cycle 5 gemcitabine /Abraxane  03/12/2024 03/23/2024 CTs: Decrease size of pancreas head/uncinate mass, mixed response involving bilateral pulmonary nodules, decreased left supraclavicular retroperitoneal lymph nodes Cycle 6 gemcitabine /Abraxane  03/27/2024 Cycle 7 gemcitabine /Abraxane  04/09/2024 Cycle 8 gemcitabine /Abraxane  04/23/2024 Pulmonary embolism 11/29/2023 Small segmental and subsegmental pulmonary emboli in the left upper lobe, dilation of right ventricle Admitted for heparin  anticoagulation followed by apixaban -discharged 12/01/2023 Lower extremity Dopplers 11/30/2023: Acute left posterior tibial, peroneal, and TP trunk DVTs, acute right peroneal DVT Lovenox  12/31/2023 Lovenox  discontinued, Eliquis  initiated 04/14/2024   3.  CVAs-visual disturbance and altered mental status 12/30/2023 MRI brain-many foci of restricted diffusion in the cerebellum, left frontal, left basal ganglia, and parietal lobes compatible with acute/subacute infarcts, consider embolic etiology 4.  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  Kidney stones 6.  Gout 7.  Family history of pancreas cancer 8.  History of basal cell carcinoma 9.  Negative genetic testing with VUS in EGFR.  First-degree relatives are candidates for considering pancreatic cancer screening based on family history.  Discussed with patient and wife 01/30/2024.        Disposition: [name] appears stable.  He has completed 7 cycles of gemcitabine /Abraxane .  Overall tolerating treatment well.  No clinical evidence of disease progression.  Most recent CA 19-9 further improved.  Plan to proceed with cycle 8 today as  scheduled.  CBC and chemistry panel all reviewed.  Labs adequate for treatment.  He continues to have intermittent lightheadedness/dizziness/nausea.  Symptoms do not correlate with treatment.  He will return for follow-up and the  next cycle of Gemcitabine /Abraxane  in 2 weeks.  He will contact the office in the interim with any problems.  [name] ANP/GNP-BC   04/23/2024  11:09 AM

## 2024-04-24 ENCOUNTER — Other Ambulatory Visit: Payer: Self-pay

## 2024-04-24 LAB — CANCER ANTIGEN 19-9: CA 19-9: 410 U/mL — ABNORMAL HIGH (ref 0–35)

## 2024-04-25 ENCOUNTER — Other Ambulatory Visit: Payer: Self-pay

## 2024-04-28 ENCOUNTER — Other Ambulatory Visit: Payer: Self-pay

## 2024-05-02 ENCOUNTER — Other Ambulatory Visit: Payer: Self-pay | Admitting: Oncology

## 2024-05-07 ENCOUNTER — Inpatient Hospital Stay (HOSPITAL_BASED_OUTPATIENT_CLINIC_OR_DEPARTMENT_OTHER): Admitting: Nurse Practitioner

## 2024-05-07 ENCOUNTER — Inpatient Hospital Stay

## 2024-05-07 ENCOUNTER — Encounter: Payer: Self-pay | Admitting: Nurse Practitioner

## 2024-05-07 VITALS — BP 123/89 | HR 74 | Temp 97.8°F | Resp 18 | Ht 66.0 in | Wt 230.0 lb

## 2024-05-07 VITALS — BP 143/76 | HR 73 | Temp 98.0°F | Resp 18

## 2024-05-07 DIAGNOSIS — Z5111 Encounter for antineoplastic chemotherapy: Secondary | ICD-10-CM | POA: Diagnosis not present

## 2024-05-07 DIAGNOSIS — C250 Malignant neoplasm of head of pancreas: Secondary | ICD-10-CM

## 2024-05-07 DIAGNOSIS — Z95828 Presence of other vascular implants and grafts: Secondary | ICD-10-CM

## 2024-05-07 LAB — CMP (CANCER CENTER ONLY)
ALT: 30 U/L (ref 0–44)
AST: 23 U/L (ref 15–41)
Albumin: 3.8 g/dL (ref 3.5–5.0)
Alkaline Phosphatase: 58 U/L (ref 38–126)
Anion gap: 10 (ref 5–15)
BUN: 12 mg/dL (ref 8–23)
CO2: 24 mmol/L (ref 22–32)
Calcium: 9.7 mg/dL (ref 8.9–10.3)
Chloride: 107 mmol/L (ref 98–111)
Creatinine: 0.93 mg/dL (ref 0.61–1.24)
GFR, Estimated: >60 mL/min (ref >60)
Glucose, Bld: 118 mg/dL — ABNORMAL HIGH (ref 70–99)
Potassium: 4.1 mmol/L (ref 3.5–5.1)
Sodium: 141 mmol/L (ref 135–145)
Total Bilirubin: 0.2 mg/dL (ref 0.0–1.2)
Total Protein: 6.1 g/dL — ABNORMAL LOW (ref 6.5–8.1)

## 2024-05-07 LAB — CBC WITH DIFFERENTIAL (CANCER CENTER ONLY)
Abs Immature Granulocytes: 0.08 K/uL — ABNORMAL HIGH (ref 0.00–0.07)
Basophils Absolute: 0.1 K/uL (ref 0.0–0.1)
Basophils Relative: 2 %
Eosinophils Absolute: 0.2 K/uL (ref 0.0–0.5)
Eosinophils Relative: 3 %
HCT: 37.2 % — ABNORMAL LOW (ref 39.0–52.0)
Hemoglobin: 12.5 g/dL — ABNORMAL LOW (ref 13.0–17.0)
Immature Granulocytes: 1 %
Lymphocytes Relative: 21 %
Lymphs Abs: 1.3 K/uL (ref 0.7–4.0)
MCH: 33.4 pg (ref 26.0–34.0)
MCHC: 33.6 g/dL (ref 30.0–36.0)
MCV: 99.5 fL (ref 80.0–100.0)
Monocytes Absolute: 1 K/uL (ref 0.1–1.0)
Monocytes Relative: 16 %
Neutro Abs: 3.4 K/uL (ref 1.7–7.7)
Neutrophils Relative %: 57 %
Platelet Count: 182 K/uL (ref 150–400)
RBC: 3.74 MIL/uL — ABNORMAL LOW (ref 4.22–5.81)
RDW: 15.8 % — ABNORMAL HIGH (ref 11.5–15.5)
WBC Count: 6 K/uL (ref 4.0–10.5)
nRBC: 0 % (ref 0.0–0.2)

## 2024-05-07 MED ORDER — SODIUM CHLORIDE 0.9% FLUSH
10.0000 mL | INTRAVENOUS | Status: DC | PRN
  Administered 2024-05-07: 10 mL

## 2024-05-07 MED ORDER — SODIUM CHLORIDE 0.9 % IV SOLN: INTRAVENOUS | Status: DC

## 2024-05-07 MED ORDER — SODIUM CHLORIDE 0.9 % IV SOLN
2200.0000 mg | Freq: Once | INTRAVENOUS | Status: AC
  Administered 2024-05-07: 2200 mg via INTRAVENOUS
  Filled 2024-05-07: qty 52.57

## 2024-05-07 MED ORDER — HEPARIN SOD (PORK) LOCK FLUSH 100 UNIT/ML IV SOLN
500.0000 [IU] | Freq: Once | INTRAVENOUS | Status: AC | PRN
Start: 2024-05-07 — End: 2024-05-07
  Administered 2024-05-07: 500 [IU]

## 2024-05-07 MED ORDER — SODIUM CHLORIDE 0.9% FLUSH
10.0000 mL | INTRAVENOUS | Status: AC | PRN
  Administered 2024-05-07: 10 mL

## 2024-05-07 MED ORDER — PACLITAXEL PROTEIN-BOUND CHEMO INJECTION 100 MG
100.0000 mg/m2 | Freq: Once | INTRAVENOUS | Status: AC
  Administered 2024-05-07: 225 mg via INTRAVENOUS
  Filled 2024-05-07: qty 45

## 2024-05-07 NOTE — Progress Notes (Signed)
 [facility] OFFICE PROGRESS NOTE   Diagnosis: Pancreas cancer  INTERVAL HISTORY:   Timothy Wilcox returns as scheduled.  He completed cycle 8 gemcitabine /Abraxane  04/23/2024.  He notes overall improvement in nausea.  He noted significant improvement with the nausea immediately following chemotherapy.  No mouth sores.  No diarrhea.  No numbness or tingling in the hands or feet.  No rash or fever after treatment.  He denies abdominal pain.  Appetite is stable.  Objective:  Vital signs in last 24 hours:  Blood pressure 123/89, pulse 74, temperature 97.8 F (36.6 C), temperature source Temporal, resp. rate 18, height 5' 6 (1.676 m), weight 230 lb (104.3 kg), SpO2 98%.    HEENT: No thrush or ulcers. Resp: Lungs clear bilaterally. Cardio: Regular rate and rhythm. GI: Abdomen soft and nontender.  No hepatosplenomegaly. Vascular: Trace bilateral lower leg edema. Neuro: Alert and oriented. Skin: No rash. Port-A-Cath without erythema.  Lab Results:  Lab Results  Component Value Date   WBC 6.0 05/07/2024   HGB 12.5 (L) 05/07/2024   HCT 37.2 (L) 05/07/2024   MCV 99.5 05/07/2024   PLT 182 05/07/2024   NEUTROABS 3.4 05/07/2024    Imaging:  No results found.  Medications: I have reviewed the patient's current medications.  Assessment/Plan: Pancreas cancer 11/19/2022: 2.9 cm thin-walled cystic lesion in the pancreas uncinate 07/28/2023: CT abdomen/pelvis: Stable 2.8 cm cystic area in the uncinate process 11/29/2023: CT chest-subsegmental and segmental pulmonary emboli in the left upper lobe, dilation of the right ventricle, multiple pulmonary nodules 12/26/2023: CA 19-9- 66,286 12/27/2023: PET-5 cm hypermetabolic pancreas head/uncinate mass, numerous hypermetabolic pulmonary nodules, hypermetabolic left supraclavicular and retroperitoneal lymph nodes 01/06/2024: Left supraclavicular lymph node biopsy-metastatic moderately differentiated adenocarcinoma,  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, upper GI, breast, and salivary gland carcinoma Foundation 1 01/06/2024-HRDsig negative, microsatellite stable, tumor mutation burden 2, K-ras G12R Cycle 1 gemcitabine /Abraxane  01/16/2024 Cycle 2 gemcitabine /Abraxane  01/30/2024 Cycle 3 gemcitabine /Abraxane  02/13/2024 Cycle 4 gemcitabine /Abraxane  02/27/2024 Cycle 5 gemcitabine /Abraxane  03/12/2024 03/23/2024 CTs: Decrease size of pancreas head/uncinate mass, mixed response involving bilateral pulmonary nodules, decreased left supraclavicular retroperitoneal lymph nodes Cycle 6 gemcitabine /Abraxane  03/27/2024 Cycle 7 gemcitabine /Abraxane  04/09/2024 Cycle 8 gemcitabine /Abraxane  04/23/2024 Cycle 9 gemcitabine /Abraxane  05/07/2024 Pulmonary embolism 11/29/2023 Small segmental and subsegmental pulmonary emboli in the left upper lobe, dilation of right ventricle Admitted for heparin  anticoagulation followed by apixaban -discharged 12/01/2023 Lower extremity Dopplers 11/30/2023: Acute left posterior tibial, peroneal, and TP trunk DVTs, acute right peroneal DVT Lovenox  12/31/2023 Lovenox  discontinued, Eliquis  initiated 04/14/2024   3.  CVAs-visual disturbance and altered mental status 12/30/2023 MRI brain-many foci of restricted diffusion in the cerebellum, left frontal, left basal ganglia, and parietal lobes compatible with acute/subacute infarcts, consider embolic etiology 4.  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  Kidney stones 6.  Gout 7.  Family history of pancreas cancer 8.  History of basal cell carcinoma 9.  Negative genetic testing with VUS in EGFR.  First-degree relatives are candidates for considering pancreatic cancer screening based on family history.  Discussed with patient and wife 01/30/2024.    Disposition: [name] appears stable.  He has completed 8 cycles of gemcitabine /Abraxane .  Overall he seems to be tolerating chemotherapy well.  Performance status continues to  be improved.  Most recent CA 19-9 tumor marker was improved.  Plan to proceed with cycle 9 today as scheduled.  CBC and chemistry panel reviewed.  Labs adequate for treatment.  He will return for follow-up and treatment in 2 weeks.  He  will contact the office in the interim with any problems.    Timothy Wilcox ANP/GNP-BC   05/07/2024  11:43 AM

## 2024-05-07 NOTE — Patient Instructions (Signed)

## 2024-05-07 NOTE — Patient Instructions (Signed)
 [facility] - A DEPT OF . [facility]  Discharge Instructions: Thank you for choosing [facility] to provide your oncology and hematology care.   If you have a lab appointment with the Cancer Center, please go directly to the Cancer Center and check in at the registration area.   Wear comfortable clothing and clothing appropriate for easy access to any Portacath or PICC line.   We strive to give you quality time with your provider. You may need to reschedule your appointment if you arrive late (15 or more minutes).  Arriving late affects you and other patients whose appointments are after yours.  Also, if you miss three or more appointments without notifying the office, you may be dismissed from the clinic at the provider's discretion.      For prescription refill requests, have your pharmacy contact our office and allow 72 hours for refills to be completed.    Today you received the following chemotherapy and/or immunotherapy agents: abraxane , gemzar  .  Gemcitabine  Injection What is this medication? GEMCITABINE  (jem SYE ta been) treats some types of cancer. It works by slowing down the growth of cancer cells. This medicine may be used for other purposes; ask your health care provider or pharmacist if you have questions. COMMON BRAND NAME(S): Gemzar , Infugem  What should I tell my care team before I take this medication? They need to know if you have any of these conditions: Blood disorders Infection Kidney disease Liver disease Lung or breathing disease, such as asthma or COPD Recent or ongoing radiation therapy An unusual or allergic reaction to gemcitabine , other medications, foods, dyes, or preservatives If you or your partner are pregnant or trying to get pregnant Breast-feeding How should I use this medication? This medication is injected into a vein. It is given by your care team in a hospital or clinic setting. Talk to your care team  about the use of this medication in children. Special care may be needed. Overdosage: If you think you have taken too much of this medicine contact a poison control center or emergency room at once. NOTE: This medicine is only for you. Do not share this medicine with others. What if I miss a dose? Keep appointments for follow-up doses. It is important not to miss your dose. Call your care team if you are unable to keep an appointment. What may interact with this medication? Interactions have not been studied. This list may not describe all possible interactions. Give your health care provider a list of all the medicines, herbs, non-prescription drugs, or dietary supplements you use. Also tell them if you smoke, drink alcohol, or use illegal drugs. Some items may interact with your medicine. What should I watch for while using this medication? Your condition will be monitored carefully while you are receiving this medication. This medication may make you feel generally unwell. This is not uncommon, as chemotherapy can affect healthy cells as well as cancer cells. Report any side effects. Continue your course of treatment even though you feel ill unless your care team tells you to stop. In some cases, you may be given additional medications to help with side effects. Follow all directions for their use. This medication may increase your risk of getting an infection. Call your care team for advice if you get a fever, chills, sore throat, or other symptoms of a cold or flu. Do not treat yourself. Try to avoid being around people who are sick. This medication  may increase your risk to bruise or bleed. Call your care team if you notice any unusual bleeding. Be careful brushing or flossing your teeth or using a toothpick because you may get an infection or bleed more easily. If you have any dental work done, tell your dentist you are receiving this medication. Avoid taking medications that contain aspirin ,  acetaminophen , ibuprofen , naproxen, or ketoprofen unless instructed by your care team. These medications may hide a fever. Talk to your care team if you or your partner wish to become pregnant or think you might be pregnant. This medication can cause serious birth defects if taken during pregnancy and for 6 months after the last dose. A negative pregnancy test is required before starting this medication. A reliable form of contraception is recommended while taking this medication and for 6 months after the last dose. Talk to your care team about effective forms of contraception. Do not father a child while taking this medication and for 3 months after the last dose. Use a condom while having sex during this time period. Do not breastfeed while taking this medication and for at least 1 week after the last dose. This medication may cause infertility. Talk to your care team if you are concerned about your fertility. What side effects may I notice from receiving this medication? Side effects that you should report to your care team as soon as possible: Allergic reactions--skin rash, itching, hives, swelling of the face, lips, tongue, or throat Capillary leak syndrome--stomach or muscle pain, unusual weakness or fatigue, feeling faint or lightheaded, decrease in the amount of urine, swelling of the ankles, hands, or feet, trouble breathing Infection--fever, chills, cough, sore throat, wounds that don't heal, pain or trouble when passing urine, general feeling of discomfort or being unwell Liver injury--right upper belly pain, loss of appetite, nausea, light-colored stool, dark yellow or brown urine, yellowing skin or eyes, unusual weakness or fatigue Low red blood cell level--unusual weakness or fatigue, dizziness, headache, trouble breathing Lung injury--shortness of breath or trouble breathing, cough, spitting up blood, chest pain, fever Stomach pain, bloody diarrhea, pale skin, unusual weakness or fatigue,  decrease in the amount of urine, which may be signs of hemolytic uremic syndrome Sudden and severe headache, confusion, change in vision, seizures, which may be signs of posterior reversible encephalopathy syndrome (PRES) Unusual bruising or bleeding Side effects that usually do not require medical attention (report to your care team if they continue or are bothersome): Diarrhea Drowsiness Hair loss Nausea Pain, redness, or swelling with sores inside the mouth or throat Vomiting This list may not describe all possible side effects. Call your doctor for medical advice about side effects. You may report side effects to FDA at 1-800-FDA-1088. Where should I keep my medication? This medication is given in a hospital or clinic. It will not be stored at home. NOTE: This sheet is a summary. It may not cover all possible information. If you have questions about this medicine, talk to your doctor, pharmacist, or health care provider.  2024 Elsevier/Gold Standard (2022-01-30 00:00:00)  Paclitaxel  Nanoparticle Albumin -Bound Injection What is this medication? NANOPARTICLE ALBUMIN -BOUND PACLITAXEL  (Na no PAHR ti kuhl al BYOO muhn-bound PAK li TAX el) treats some types of cancer. It works by slowing down the growth of cancer cells. This medicine may be used for other purposes; ask your health care provider or pharmacist if you have questions. COMMON BRAND NAME(S): Abraxane  What should I tell my care team before I take this medication? They need  to know if you have any of these conditions: Liver disease Low white blood cell levels An unusual or allergic reaction to paclitaxel , albumin , other medications, foods, dyes, or preservatives If you or your partner are pregnant or trying to get pregnant Breast-feeding How should I use this medication? This medication is injected into a vein. It is given by your care team in a hospital or clinic setting. Talk to your care team about the use of this medication  in children. Special care may be needed. Overdosage: If you think you have taken too much of this medicine contact a poison control center or emergency room at once. NOTE: This medicine is only for you. Do not share this medicine with others. What if I miss a dose? Keep appointments for follow-up doses. It is important not to miss your dose. Call your care team if you are unable to keep an appointment. What may interact with this medication? Other medications may affect the way this medication works. Talk with your care team about all of the medications you take. They may suggest changes to your treatment plan to lower the risk of side effects and to make sure your medications work as intended. This list may not describe all possible interactions. Give your health care provider a list of all the medicines, herbs, non-prescription drugs, or dietary supplements you use. Also tell them if you smoke, drink alcohol, or use illegal drugs. Some items may interact with your medicine. What should I watch for while using this medication? Your condition will be monitored carefully while you are receiving this medication. You may need blood work while taking this medication. This medication may make you feel generally unwell. This is not uncommon as chemotherapy can affect healthy cells as well as cancer cells. Report any side effects. Continue your course of treatment even though you feel ill unless your care team tells you to stop. This medication can cause serious allergic reactions. To reduce the risk, your care team may give you other medications to take before receiving this one. Be sure to follow the directions from your care team. This medication may increase your risk of getting an infection. Call your care team for advice if you get a fever, chills, sore throat, or other symptoms of a cold or flu. Do not treat yourself. Try to avoid being around people who are sick. This medication may increase your risk  to bruise or bleed. Call your care team if you notice any unusual bleeding. Be careful brushing or flossing your teeth or using a toothpick because you may get an infection or bleed more easily. If you have any dental work done, tell your dentist you are receiving this medication. Talk to your care team if you or your partner may be pregnant. Serious birth defects can occur if you take this medication during pregnancy and for 6 months after the last dose. You will need a negative pregnancy test before starting this medication. Contraception is recommended while taking this medication and for 6 months after the last dose. Your care team can help you find the option that works for you. If your partner can get pregnant, use a condom during sex while taking this medication and for 3 months after the last dose. Do not breastfeed while taking this medication and for 2 weeks after the last dose. This medication may cause infertility. Talk to your care team if you are concerned about your fertility. What side effects may I notice from receiving this  medication? Side effects that you should report to your care team as soon as possible: Allergic reactions--skin rash, itching, hives, swelling of the face, lips, tongue, or throat Dry cough, shortness of breath or trouble breathing Infection--fever, chills, cough, sore throat, wounds that don't heal, pain or trouble when passing urine, general feeling of discomfort or being unwell Low red blood cell level--unusual weakness or fatigue, dizziness, headache, trouble breathing Pain, tingling, or numbness in the hands or feet Stomach pain, unusual weakness or fatigue, nausea, vomiting, diarrhea, or fever that lasts longer than expected Unusual bruising or bleeding Side effects that usually do not require medical attention (report to your care team if they continue or are bothersome): Diarrhea Fatigue Hair loss Loss of appetite Nausea Vomiting This list may not  describe all possible side effects. Call your doctor for medical advice about side effects. You may report side effects to FDA at 1-800-FDA-1088. Where should I keep my medication? This medication is given in a hospital or clinic. It will not be stored at home. NOTE: This sheet is a summary. It may not cover all possible information. If you have questions about this medicine, talk to your doctor, pharmacist, or health care provider.  2024 Elsevier/Gold Standard (2022-02-08 00:00:00)    To help prevent nausea and vomiting after your treatment, we encourage you to take your nausea medication as directed.  BELOW ARE SYMPTOMS THAT SHOULD BE REPORTED IMMEDIATELY: *FEVER GREATER THAN 100.4 F (38 C) OR HIGHER *CHILLS OR SWEATING *NAUSEA AND VOMITING THAT IS NOT CONTROLLED WITH YOUR NAUSEA MEDICATION *UNUSUAL SHORTNESS OF BREATH *UNUSUAL BRUISING OR BLEEDING *URINARY PROBLEMS (pain or burning when urinating, or frequent urination) *BOWEL PROBLEMS (unusual diarrhea, constipation, pain near the anus) TENDERNESS IN MOUTH AND THROAT WITH OR WITHOUT PRESENCE OF ULCERS (sore throat, sores in mouth, or a toothache) UNUSUAL RASH, SWELLING OR PAIN  UNUSUAL VAGINAL DISCHARGE OR ITCHING   Items with * indicate a potential emergency and should be followed up as soon as possible or go to the Emergency Department if any problems should occur.  Please show the CHEMOTHERAPY ALERT CARD or IMMUNOTHERAPY ALERT CARD at check-in to the Emergency Department and triage nurse.  Should you have questions after your visit or need to cancel or reschedule your appointment, please contact [facility] - A DEPT OF [facility]  Dept: [phone]  and follow the prompts.  Office hours are 8:00 a.m. to 4:30 p.m. Monday - Friday. Please note that voicemails left after 4:00 p.m. may not be returned until the following business day.  We are closed weekends and major holidays. You have access to a  nurse at all times for urgent questions. Please call the main number to the clinic Dept: [phone] and follow the prompts.   For any non-urgent questions, you may also contact your provider using MyChart. We now offer e-Visits for anyone 25 and older to request care online for non-urgent symptoms. For details visit [email or web address].   Also download the MyChart app! Go to the app store, search MyChart, open the app, select [place], and log in with your MyChart username and password.

## 2024-05-07 NOTE — Progress Notes (Signed)
 Patient seen by Lonna Cobb NP today  Vitals are within treatment parameters:Yes   Labs are within treatment parameters: Yes   Treatment plan has been signed: Yes   Per physician team, Patient is ready for treatment and there are NO modifications to the treatment plan.

## 2024-05-08 ENCOUNTER — Other Ambulatory Visit: Payer: Self-pay

## 2024-05-08 ENCOUNTER — Encounter: Payer: Self-pay | Admitting: Nurse Practitioner

## 2024-05-08 ENCOUNTER — Other Ambulatory Visit (HOSPITAL_BASED_OUTPATIENT_CLINIC_OR_DEPARTMENT_OTHER): Payer: Self-pay

## 2024-05-08 DIAGNOSIS — C250 Malignant neoplasm of head of pancreas: Secondary | ICD-10-CM

## 2024-05-08 LAB — CANCER ANTIGEN 19-9: CA 19-9: 387 U/mL — ABNORMAL HIGH (ref 0–35)

## 2024-05-08 MED ORDER — DEXAMETHASONE 4 MG PO TABS
4.0000 mg | ORAL_TABLET | Freq: Two times a day (BID) | ORAL | 1 refills | Status: DC
  Filled 2024-05-08: qty 60, 30d supply, fill #0

## 2024-05-13 ENCOUNTER — Encounter: Payer: Self-pay | Admitting: Nurse Practitioner

## 2024-05-13 ENCOUNTER — Other Ambulatory Visit: Payer: Self-pay | Admitting: *Deleted

## 2024-05-13 MED ORDER — APIXABAN 5 MG PO TABS
5.0000 mg | ORAL_TABLET | Freq: Two times a day (BID) | ORAL | 5 refills | Status: DC
  Filled 2024-05-28 – 2024-06-10 (×2): qty 60, 30d supply, fill #0
  Filled 2024-07-04 (×2): qty 60, 30d supply, fill #1
  Filled 2024-08-03: qty 60, 30d supply, fill #2
  Filled 2024-09-02: qty 60, 30d supply, fill #3
  Filled 2024-09-30: qty 60, 30d supply, fill #4

## 2024-05-13 NOTE — Telephone Encounter (Signed)
 Timothy Wilcox sent Mychart message that he needs refill on Eliquis 

## 2024-05-14 ENCOUNTER — Encounter: Payer: Self-pay | Admitting: Oncology

## 2024-05-15 ENCOUNTER — Telehealth: Payer: Self-pay | Admitting: *Deleted

## 2024-05-15 NOTE — Telephone Encounter (Signed)
 Called patient in regards to his MyChart message reporting he has been almost immobile for last 4 days and is doubtful of his 8/14 infusion. He reports extreme fatigue/weakness/lethargy beginning on 8/3-so weak that he was in bed on on couch all day. Was too weak to hold his book up to read. Feels lightheaded with position changes. Can only walk ~ 20 feet before needing to rest. Sounded SOB in conversation, but he says this is his norma. Denies any chest pain, swelling, blood in urine or stool. Has been able to eat/drink. Did not have as much nausea with this cycle. He does report he is feeling better today. Unsure if he wants chemo on 8/14. Encouraged him to keep the appointment since he will be seeing provider as well and we can always cancel his treatment that day and reschedule. He agrees. Inquired if he needs to be seen today or go to emergency room and he declines both.

## 2024-05-17 ENCOUNTER — Other Ambulatory Visit: Payer: Self-pay | Admitting: Oncology

## 2024-05-21 ENCOUNTER — Inpatient Hospital Stay

## 2024-05-21 ENCOUNTER — Inpatient Hospital Stay: Attending: Oncology | Admitting: Oncology

## 2024-05-21 ENCOUNTER — Telehealth: Payer: Self-pay | Admitting: Oncology

## 2024-05-21 VITALS — BP 121/72 | HR 72 | Temp 97.8°F | Resp 19 | Ht 66.0 in | Wt 230.8 lb

## 2024-05-21 DIAGNOSIS — Z5111 Encounter for antineoplastic chemotherapy: Secondary | ICD-10-CM | POA: Diagnosis present

## 2024-05-21 DIAGNOSIS — C250 Malignant neoplasm of head of pancreas: Secondary | ICD-10-CM | POA: Insufficient documentation

## 2024-05-21 DIAGNOSIS — C770 Secondary and unspecified malignant neoplasm of lymph nodes of head, face and neck: Secondary | ICD-10-CM | POA: Insufficient documentation

## 2024-05-21 LAB — CMP (CANCER CENTER ONLY)
ALT: 28 U/L (ref 0–44)
AST: 24 U/L (ref 15–41)
Albumin: 3.6 g/dL (ref 3.5–5.0)
Alkaline Phosphatase: 57 U/L (ref 38–126)
Anion gap: 12 (ref 5–15)
BUN: 12 mg/dL (ref 8–23)
CO2: 22 mmol/L (ref 22–32)
Calcium: 9.7 mg/dL (ref 8.9–10.3)
Chloride: 104 mmol/L (ref 98–111)
Creatinine: 1 mg/dL (ref 0.61–1.24)
GFR, Estimated: >60 mL/min (ref >60)
Glucose, Bld: 138 mg/dL — ABNORMAL HIGH (ref 70–99)
Potassium: 4.1 mmol/L (ref 3.5–5.1)
Sodium: 139 mmol/L (ref 135–145)
Total Bilirubin: 0.4 mg/dL (ref 0.0–1.2)
Total Protein: 6.1 g/dL — ABNORMAL LOW (ref 6.5–8.1)

## 2024-05-21 LAB — CBC WITH DIFFERENTIAL (CANCER CENTER ONLY)
Abs Immature Granulocytes: 0.15 K/uL — ABNORMAL HIGH (ref 0.00–0.07)
Basophils Absolute: 0.1 K/uL (ref 0.0–0.1)
Basophils Relative: 1 %
Eosinophils Absolute: 0.2 K/uL (ref 0.0–0.5)
Eosinophils Relative: 3 %
HCT: 36.9 % — ABNORMAL LOW (ref 39.0–52.0)
Hemoglobin: 12.5 g/dL — ABNORMAL LOW (ref 13.0–17.0)
Immature Granulocytes: 2 %
Lymphocytes Relative: 20 %
Lymphs Abs: 1.4 K/uL (ref 0.7–4.0)
MCH: 33.7 pg (ref 26.0–34.0)
MCHC: 33.9 g/dL (ref 30.0–36.0)
MCV: 99.5 fL (ref 80.0–100.0)
Monocytes Absolute: 1.2 K/uL — ABNORMAL HIGH (ref 0.1–1.0)
Monocytes Relative: 17 %
Neutro Abs: 3.9 K/uL (ref 1.7–7.7)
Neutrophils Relative %: 57 %
Platelet Count: 229 K/uL (ref 150–400)
RBC: 3.71 MIL/uL — ABNORMAL LOW (ref 4.22–5.81)
RDW: 15.4 % (ref 11.5–15.5)
WBC Count: 6.9 K/uL (ref 4.0–10.5)
nRBC: 0 % (ref 0.0–0.2)

## 2024-05-21 NOTE — Telephone Encounter (Signed)
 Patient has been scheduled for follow-up visit per 05/21/24 LOS.  Pt aware of scheduled appt details.

## 2024-05-21 NOTE — Progress Notes (Signed)
 [facility] OFFICE PROGRESS NOTE   Diagnosis: Pancreas cancer  INTERVAL HISTORY:   [name] returns as scheduled.  He completed another cycle of gemcitabine /Abraxane  on 05/07/2024.  He took Decadron  for for 3 days following chemotherapy.  He had less nausea while taking Decadron .  He developed profound malaise after discontinuation of Decadron .  The malaise has persisted.  He has exertional dyspnea.  He feels in a brain fog .  No focal neurologic symptoms.  He continues apixaban  anticoagulation.  Objective:  Vital signs in last 24 hours:  Blood pressure 121/72, pulse 72, temperature 97.8 F (36.6 C), resp. rate 19, height 5' 6 (1.676 m), weight 230 lb 12.8 oz (104.7 kg), SpO2 98%.    HEENT: No thrush Resp: Lungs clear bilaterally Cardio: Regular rate and rhythm, distant heart sounds GI: Nontender, no hepatosplenomegaly Vascular: No leg edema Neuro: Alert and oriented   Portacath/PICC-without erythema  Lab Results:  Lab Results  Component Value Date   WBC 6.9 05/21/2024   HGB 12.5 (L) 05/21/2024   HCT 36.9 (L) 05/21/2024   MCV 99.5 05/21/2024   PLT 229 05/21/2024   NEUTROABS 3.9 05/21/2024    CMP  Lab Results  Component Value Date   NA 139 05/21/2024   K 4.1 05/21/2024   CL 104 05/21/2024   CO2 22 05/21/2024   GLUCOSE 138 (H) 05/21/2024   BUN 12 05/21/2024   CREATININE 1.00 05/21/2024   CALCIUM  9.7 05/21/2024   PROT 6.1 (L) 05/21/2024   ALBUMIN  3.6 05/21/2024   AST 24 05/21/2024   ALT 28 05/21/2024   ALKPHOS 57 05/21/2024   BILITOT 0.4 05/21/2024   GFRNONAA >60 05/21/2024   GFRAA >60 11/08/2018    Lab Results  Component Value Date   RJW800 387 (H) 05/07/2024      Medications: I have reviewed the patient's current medications.   Assessment/Plan: Pancreas cancer 11/19/2022: 2.9 cm thin-walled cystic lesion in the pancreas uncinate 07/28/2023: CT abdomen/pelvis: Stable 2.8 cm cystic area in the uncinate process 11/29/2023: CT  chest-subsegmental and segmental pulmonary emboli in the left upper lobe, dilation of the right ventricle, multiple pulmonary nodules 12/26/2023: CA 19-9- 66,286 12/27/2023: PET-5 cm hypermetabolic pancreas head/uncinate mass, numerous hypermetabolic pulmonary nodules, hypermetabolic left supraclavicular and retroperitoneal lymph nodes 01/06/2024: Left supraclavicular lymph node biopsy-metastatic moderately differentiated adenocarcinoma, 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, upper GI, breast, and salivary gland carcinoma Foundation 1 01/06/2024-HRDsig negative, microsatellite stable, tumor mutation burden 2, K-ras G12R Cycle 1 gemcitabine /Abraxane  01/16/2024 Cycle 2 gemcitabine /Abraxane  01/30/2024 Cycle 3 gemcitabine /Abraxane  02/13/2024 Cycle 4 gemcitabine /Abraxane  02/27/2024 Cycle 5 gemcitabine /Abraxane  03/12/2024 03/23/2024 CTs: Decrease size of pancreas head/uncinate mass, mixed response involving bilateral pulmonary nodules, decreased left supraclavicular retroperitoneal lymph nodes Cycle 6 gemcitabine /Abraxane  03/27/2024 Cycle 7 gemcitabine /Abraxane  04/09/2024 Cycle 8 gemcitabine /Abraxane  04/23/2024 Cycle 9 gemcitabine /Abraxane  05/07/2024 Pulmonary embolism 11/29/2023 Small segmental and subsegmental pulmonary emboli in the left upper lobe, dilation of right ventricle Admitted for heparin  anticoagulation followed by apixaban -discharged 12/01/2023 Lower extremity Dopplers 11/30/2023: Acute left posterior tibial, peroneal, and TP trunk DVTs, acute right peroneal DVT Lovenox  12/31/2023 Lovenox  discontinued, Eliquis  initiated 04/14/2024   3.  CVAs-visual disturbance and altered mental status 12/30/2023 MRI brain-many foci of restricted diffusion in the cerebellum, left frontal, left basal ganglia, and parietal lobes compatible with acute/subacute infarcts, consider embolic etiology 4.  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  Kidney stones 6.  Gout 7.  Family history  of pancreas cancer 8.  History of basal cell carcinoma 9.  Negative genetic testing with VUS in EGFR.  First-degree relatives are candidates for considering pancreatic cancer screening based on family history.  Discussed with patient and wife 01/30/2024.      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  He has completed 9 cycles of gemcitabine /Abraxane .  The CA 19-9 was lower on 05/07/2024.  He has developed progressive malaise over the past 2 weeks.  The etiology of his symptoms is unclear.  He reports feeling significantly better while on Decadron .  He will resume a trial of once daily Decadron  today.  He would like to hold treatment today.  He will return for an office visit with the plan to resume gemcitabine /Abraxane  next week.  We will follow-up on the CA 19-9 from today. I have a low clinical suspicion for a pulmonary embolism, recurrent stroke, and disease progression; but these are possible.  We will plan for repeat imaging if he is not improved next week.  Arley Hof, MD  05/21/2024  12:42 PM

## 2024-05-22 ENCOUNTER — Other Ambulatory Visit: Payer: Self-pay

## 2024-05-22 LAB — CANCER ANTIGEN 19-9: CA 19-9: 376 U/mL — ABNORMAL HIGH (ref 0–35)

## 2024-05-25 ENCOUNTER — Other Ambulatory Visit: Payer: Self-pay

## 2024-05-28 ENCOUNTER — Inpatient Hospital Stay (HOSPITAL_BASED_OUTPATIENT_CLINIC_OR_DEPARTMENT_OTHER): Admitting: Nurse Practitioner

## 2024-05-28 ENCOUNTER — Inpatient Hospital Stay

## 2024-05-28 ENCOUNTER — Other Ambulatory Visit

## 2024-05-28 ENCOUNTER — Other Ambulatory Visit (HOSPITAL_BASED_OUTPATIENT_CLINIC_OR_DEPARTMENT_OTHER): Payer: Self-pay

## 2024-05-28 ENCOUNTER — Encounter: Payer: Self-pay | Admitting: Nurse Practitioner

## 2024-05-28 VITALS — BP 125/62 | HR 74 | Temp 97.7°F | Resp 18

## 2024-05-28 VITALS — BP 139/62 | HR 75 | Temp 98.2°F | Resp 18 | Ht 66.0 in | Wt 231.6 lb

## 2024-05-28 DIAGNOSIS — Z5111 Encounter for antineoplastic chemotherapy: Secondary | ICD-10-CM | POA: Diagnosis not present

## 2024-05-28 DIAGNOSIS — C250 Malignant neoplasm of head of pancreas: Secondary | ICD-10-CM | POA: Diagnosis not present

## 2024-05-28 LAB — CMP (CANCER CENTER ONLY)
ALT: 24 U/L (ref 0–44)
AST: 20 U/L (ref 15–41)
Albumin: 3.6 g/dL (ref 3.5–5.0)
Alkaline Phosphatase: 62 U/L (ref 38–126)
Anion gap: 13 (ref 5–15)
BUN: 15 mg/dL (ref 8–23)
CO2: 21 mmol/L — ABNORMAL LOW (ref 22–32)
Calcium: 9.4 mg/dL (ref 8.9–10.3)
Chloride: 104 mmol/L (ref 98–111)
Creatinine: 1.16 mg/dL (ref 0.61–1.24)
GFR, Estimated: >60 mL/min (ref >60)
Glucose, Bld: 186 mg/dL — ABNORMAL HIGH (ref 70–99)
Potassium: 4.1 mmol/L (ref 3.5–5.1)
Sodium: 139 mmol/L (ref 135–145)
Total Bilirubin: 0.4 mg/dL (ref 0.0–1.2)
Total Protein: 5.8 g/dL — ABNORMAL LOW (ref 6.5–8.1)

## 2024-05-28 LAB — CBC WITH DIFFERENTIAL (CANCER CENTER ONLY)
Abs Immature Granulocytes: 0.15 K/uL — ABNORMAL HIGH (ref 0.00–0.07)
Basophils Absolute: 0.1 K/uL (ref 0.0–0.1)
Basophils Relative: 2 %
Eosinophils Absolute: 0.3 K/uL (ref 0.0–0.5)
Eosinophils Relative: 5 %
HCT: 36.4 % — ABNORMAL LOW (ref 39.0–52.0)
Hemoglobin: 12.4 g/dL — ABNORMAL LOW (ref 13.0–17.0)
Immature Granulocytes: 3 %
Lymphocytes Relative: 18 %
Lymphs Abs: 1 K/uL (ref 0.7–4.0)
MCH: 34.3 pg — ABNORMAL HIGH (ref 26.0–34.0)
MCHC: 34.1 g/dL (ref 30.0–36.0)
MCV: 100.6 fL — ABNORMAL HIGH (ref 80.0–100.0)
Monocytes Absolute: 0.7 K/uL (ref 0.1–1.0)
Monocytes Relative: 12 %
Neutro Abs: 3.4 K/uL (ref 1.7–7.7)
Neutrophils Relative %: 60 %
Platelet Count: 272 K/uL (ref 150–400)
RBC: 3.62 MIL/uL — ABNORMAL LOW (ref 4.22–5.81)
RDW: 15.3 % (ref 11.5–15.5)
WBC Count: 5.6 K/uL (ref 4.0–10.5)
nRBC: 0 % (ref 0.0–0.2)

## 2024-05-28 MED ORDER — PACLITAXEL PROTEIN-BOUND CHEMO INJECTION 100 MG
100.0000 mg/m2 | Freq: Once | INTRAVENOUS | Status: AC
  Administered 2024-05-28: 225 mg via INTRAVENOUS
  Filled 2024-05-28: qty 45

## 2024-05-28 MED ORDER — ALLOPURINOL 300 MG PO TABS
300.0000 mg | ORAL_TABLET | Freq: Every day | ORAL | 1 refills | Status: DC
  Filled 2024-05-28 – 2024-06-22 (×2): qty 90, 90d supply, fill #0

## 2024-05-28 MED ORDER — SODIUM CHLORIDE 0.9 % IV SOLN
2200.0000 mg | Freq: Once | INTRAVENOUS | Status: AC
  Administered 2024-05-28: 2200 mg via INTRAVENOUS
  Filled 2024-05-28: qty 52.57

## 2024-05-28 MED ORDER — PREDNISONE 20 MG PO TABS
20.0000 mg | ORAL_TABLET | Freq: Every day | ORAL | 1 refills | Status: DC
  Filled 2024-05-28: qty 30, 30d supply, fill #0
  Filled 2024-06-10 – 2024-06-20 (×2): qty 30, 30d supply, fill #1

## 2024-05-28 MED ORDER — SODIUM CHLORIDE 0.9 % IV SOLN: INTRAVENOUS | Status: DC

## 2024-05-28 MED FILL — Amlodipine Besylate Tab 5 MG (Base Equivalent): ORAL | 90 days supply | Qty: 90 | Fill #0 | Status: AC

## 2024-05-28 NOTE — Progress Notes (Signed)
 [facility] OFFICE PROGRESS NOTE   Diagnosis: Pancreas cancer  INTERVAL HISTORY:   Timothy Wilcox returns for the next cycle of gemcitabine /Abraxane .  He requested an appointment prior to proceeding with treatment.  Chemotherapy was held last week due to progressive malaise.  He was started on a trial of once daily Decadron .  He noted mild improvement in his energy level and queasiness with the dexamethasone .  He discontinued it after 3 or 4 days.  Brain confusion persists.  Since the incidence of severe fatigue he has noted intermittent numbness beginning at the buttocks and extending to his feet.  The numbness is position related.  Symptoms subside when he is active.  No numbness or tingling in the hands.  He thinks the numbness improved when he was taking dexamethasone .  He denies back pain.  No bowel or bladder dysfunction.  He is intermittently short of breath.  Objective:  Vital signs in last 24 hours:  Blood pressure 139/62, pulse 75, temperature 98.2 F (36.8 C), temperature source Temporal, resp. rate 18, height 5' 6 (1.676 m), weight 231 lb 9.6 oz (105.1 kg), SpO2 96%.    HEENT: No thrush. Resp: Lungs clear bilaterally. Cardio: Regular rate and rhythm. GI: No hepatosplenomegaly.  Nontender. Vascular: No leg edema.  Port-A-Cath without erythema.  Lab Results:  Lab Results  Component Value Date   WBC 5.6 05/28/2024   HGB 12.4 (L) 05/28/2024   HCT 36.4 (L) 05/28/2024   MCV 100.6 (H) 05/28/2024   PLT 272 05/28/2024   NEUTROABS 3.4 05/28/2024    Imaging:  No results found.  Medications: I have reviewed the patient's current medications.  Assessment/Plan: Pancreas cancer 11/19/2022: 2.9 cm thin-walled cystic lesion in the pancreas uncinate 07/28/2023: CT abdomen/pelvis: Stable 2.8 cm cystic area in the uncinate process 11/29/2023: CT chest-subsegmental and segmental pulmonary emboli in the left upper lobe, dilation of the right ventricle,  multiple pulmonary nodules 12/26/2023: CA 19-9- 66,286 12/27/2023: PET-5 cm hypermetabolic pancreas head/uncinate mass, numerous hypermetabolic pulmonary nodules, hypermetabolic left supraclavicular and retroperitoneal lymph nodes 01/06/2024: Left supraclavicular lymph node biopsy-metastatic moderately differentiated adenocarcinoma, 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, upper GI, breast, and salivary gland carcinoma Foundation 1 01/06/2024-HRDsig negative, microsatellite stable, tumor mutation burden 2, K-ras G12R Cycle 1 gemcitabine /Abraxane  01/16/2024 Cycle 2 gemcitabine /Abraxane  01/30/2024 Cycle 3 gemcitabine /Abraxane  02/13/2024 Cycle 4 gemcitabine /Abraxane  02/27/2024 Cycle 5 gemcitabine /Abraxane  03/12/2024 03/23/2024 CTs: Decrease size of pancreas head/uncinate mass, mixed response involving bilateral pulmonary nodules, decreased left supraclavicular retroperitoneal lymph nodes Cycle 6 gemcitabine /Abraxane  03/27/2024 Cycle 7 gemcitabine /Abraxane  04/09/2024 Cycle 8 gemcitabine /Abraxane  04/23/2024 Cycle 9 gemcitabine /Abraxane  05/07/2024 Cycle 10 gemcitabine /Abraxane  05/28/2024 Pulmonary embolism 11/29/2023 Small segmental and subsegmental pulmonary emboli in the left upper lobe, dilation of right ventricle Admitted for heparin  anticoagulation followed by apixaban -discharged 12/01/2023 Lower extremity Dopplers 11/30/2023: Acute left posterior tibial, peroneal, and TP trunk DVTs, acute right peroneal DVT Lovenox  12/31/2023 Lovenox  discontinued, Eliquis  initiated 04/14/2024   3.  CVAs-visual disturbance and altered mental status 12/30/2023 MRI brain-many foci of restricted diffusion in the cerebellum, left frontal, left basal ganglia, and parietal lobes compatible with acute/subacute infarcts, consider embolic etiology 4.  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  Kidney stones 6.  Gout 7.  Family history of pancreas cancer 8.  History of basal cell carcinoma 9.   Negative genetic testing with VUS in EGFR.  First-degree relatives are candidates for considering pancreatic cancer screening based on family history.  Discussed with patient and wife 01/30/2024.      Disposition:  Timothy Wilcox appears stable.  He has completed 9 cycles of gemcitabine /Abraxane .  Most recent CA 19-9 was further improved.  Treatment was held last week due to severe malaise.  He seems to have derived mild benefit from the dexamethasone  which he took for 3 or 4 days.  He will begin prednisone  20 mg daily.  Plan to proceed with gemcitabine /Abraxane  today as scheduled.  Restaging CTs before next office visit.  We reviewed potential side effects associated with steroids.  He will monitor his blood sugar.  He understands to contact the office with readings 300 or greater.  He will return for follow-up as scheduled 06/12/2024.  We are available to see him sooner if needed.  Plan reviewed with Dr. Cloretta.    Timothy Wilcox ANP/GNP-BC   05/28/2024  12:19 PM

## 2024-05-28 NOTE — Progress Notes (Signed)
 Patient seen by [name] NP today  Vitals are within treatment parameters:Yes   Labs are within treatment parameters: Yes   Treatment plan has been signed: Yes   Per physician team, Patient is ready for treatment and there are NO modifications to the treatment plan.

## 2024-05-28 NOTE — Patient Instructions (Signed)
 [facility] - A DEPT OF . [facility]  Discharge Instructions: Thank you for choosing [facility] to provide your oncology and hematology care.   If you have a lab appointment with the Cancer Center, please go directly to the Cancer Center and check in at the registration area.   Wear comfortable clothing and clothing appropriate for easy access to any Portacath or PICC line.   We strive to give you quality time with your provider. You may need to reschedule your appointment if you arrive late (15 or more minutes).  Arriving late affects you and other patients whose appointments are after yours.  Also, if you miss three or more appointments without notifying the office, you may be dismissed from the clinic at the provider's discretion.      For prescription refill requests, have your pharmacy contact our office and allow 72 hours for refills to be completed.    Today you received the following chemotherapy and/or immunotherapy agents: abraxane , gemzar  .  Gemcitabine  Injection What is this medication? GEMCITABINE  (jem SYE ta been) treats some types of cancer. It works by slowing down the growth of cancer cells. This medicine may be used for other purposes; ask your health care provider or pharmacist if you have questions. COMMON BRAND NAME(S): Gemzar , Infugem  What should I tell my care team before I take this medication? They need to know if you have any of these conditions: Blood disorders Infection Kidney disease Liver disease Lung or breathing disease, such as asthma or COPD Recent or ongoing radiation therapy An unusual or allergic reaction to gemcitabine , other medications, foods, dyes, or preservatives If you or your partner are pregnant or trying to get pregnant Breast-feeding How should I use this medication? This medication is injected into a vein. It is given by your care team in a hospital or clinic setting. Talk to your care team  about the use of this medication in children. Special care may be needed. Overdosage: If you think you have taken too much of this medicine contact a poison control center or emergency room at once. NOTE: This medicine is only for you. Do not share this medicine with others. What if I miss a dose? Keep appointments for follow-up doses. It is important not to miss your dose. Call your care team if you are unable to keep an appointment. What may interact with this medication? Interactions have not been studied. This list may not describe all possible interactions. Give your health care provider a list of all the medicines, herbs, non-prescription drugs, or dietary supplements you use. Also tell them if you smoke, drink alcohol, or use illegal drugs. Some items may interact with your medicine. What should I watch for while using this medication? Your condition will be monitored carefully while you are receiving this medication. This medication may make you feel generally unwell. This is not uncommon, as chemotherapy can affect healthy cells as well as cancer cells. Report any side effects. Continue your course of treatment even though you feel ill unless your care team tells you to stop. In some cases, you may be given additional medications to help with side effects. Follow all directions for their use. This medication may increase your risk of getting an infection. Call your care team for advice if you get a fever, chills, sore throat, or other symptoms of a cold or flu. Do not treat yourself. Try to avoid being around people who are sick. This medication  may increase your risk to bruise or bleed. Call your care team if you notice any unusual bleeding. Be careful brushing or flossing your teeth or using a toothpick because you may get an infection or bleed more easily. If you have any dental work done, tell your dentist you are receiving this medication. Avoid taking medications that contain aspirin ,  acetaminophen , ibuprofen , naproxen, or ketoprofen unless instructed by your care team. These medications may hide a fever. Talk to your care team if you or your partner wish to become pregnant or think you might be pregnant. This medication can cause serious birth defects if taken during pregnancy and for 6 months after the last dose. A negative pregnancy test is required before starting this medication. A reliable form of contraception is recommended while taking this medication and for 6 months after the last dose. Talk to your care team about effective forms of contraception. Do not father a child while taking this medication and for 3 months after the last dose. Use a condom while having sex during this time period. Do not breastfeed while taking this medication and for at least 1 week after the last dose. This medication may cause infertility. Talk to your care team if you are concerned about your fertility. What side effects may I notice from receiving this medication? Side effects that you should report to your care team as soon as possible: Allergic reactions--skin rash, itching, hives, swelling of the face, lips, tongue, or throat Capillary leak syndrome--stomach or muscle pain, unusual weakness or fatigue, feeling faint or lightheaded, decrease in the amount of urine, swelling of the ankles, hands, or feet, trouble breathing Infection--fever, chills, cough, sore throat, wounds that don't heal, pain or trouble when passing urine, general feeling of discomfort or being unwell Liver injury--right upper belly pain, loss of appetite, nausea, light-colored stool, dark yellow or brown urine, yellowing skin or eyes, unusual weakness or fatigue Low red blood cell level--unusual weakness or fatigue, dizziness, headache, trouble breathing Lung injury--shortness of breath or trouble breathing, cough, spitting up blood, chest pain, fever Stomach pain, bloody diarrhea, pale skin, unusual weakness or fatigue,  decrease in the amount of urine, which may be signs of hemolytic uremic syndrome Sudden and severe headache, confusion, change in vision, seizures, which may be signs of posterior reversible encephalopathy syndrome (PRES) Unusual bruising or bleeding Side effects that usually do not require medical attention (report to your care team if they continue or are bothersome): Diarrhea Drowsiness Hair loss Nausea Pain, redness, or swelling with sores inside the mouth or throat Vomiting This list may not describe all possible side effects. Call your doctor for medical advice about side effects. You may report side effects to FDA at 1-800-FDA-1088. Where should I keep my medication? This medication is given in a hospital or clinic. It will not be stored at home. NOTE: This sheet is a summary. It may not cover all possible information. If you have questions about this medicine, talk to your doctor, pharmacist, or health care provider.  2024 Elsevier/Gold Standard (2022-01-30 00:00:00)  Paclitaxel  Nanoparticle Albumin -Bound Injection What is this medication? NANOPARTICLE ALBUMIN -BOUND PACLITAXEL  (Na no PAHR ti kuhl al BYOO muhn-bound PAK li TAX el) treats some types of cancer. It works by slowing down the growth of cancer cells. This medicine may be used for other purposes; ask your health care provider or pharmacist if you have questions. COMMON BRAND NAME(S): Abraxane  What should I tell my care team before I take this medication? They need  to know if you have any of these conditions: Liver disease Low white blood cell levels An unusual or allergic reaction to paclitaxel , albumin , other medications, foods, dyes, or preservatives If you or your partner are pregnant or trying to get pregnant Breast-feeding How should I use this medication? This medication is injected into a vein. It is given by your care team in a hospital or clinic setting. Talk to your care team about the use of this medication  in children. Special care may be needed. Overdosage: If you think you have taken too much of this medicine contact a poison control center or emergency room at once. NOTE: This medicine is only for you. Do not share this medicine with others. What if I miss a dose? Keep appointments for follow-up doses. It is important not to miss your dose. Call your care team if you are unable to keep an appointment. What may interact with this medication? Other medications may affect the way this medication works. Talk with your care team about all of the medications you take. They may suggest changes to your treatment plan to lower the risk of side effects and to make sure your medications work as intended. This list may not describe all possible interactions. Give your health care provider a list of all the medicines, herbs, non-prescription drugs, or dietary supplements you use. Also tell them if you smoke, drink alcohol, or use illegal drugs. Some items may interact with your medicine. What should I watch for while using this medication? Your condition will be monitored carefully while you are receiving this medication. You may need blood work while taking this medication. This medication may make you feel generally unwell. This is not uncommon as chemotherapy can affect healthy cells as well as cancer cells. Report any side effects. Continue your course of treatment even though you feel ill unless your care team tells you to stop. This medication can cause serious allergic reactions. To reduce the risk, your care team may give you other medications to take before receiving this one. Be sure to follow the directions from your care team. This medication may increase your risk of getting an infection. Call your care team for advice if you get a fever, chills, sore throat, or other symptoms of a cold or flu. Do not treat yourself. Try to avoid being around people who are sick. This medication may increase your risk  to bruise or bleed. Call your care team if you notice any unusual bleeding. Be careful brushing or flossing your teeth or using a toothpick because you may get an infection or bleed more easily. If you have any dental work done, tell your dentist you are receiving this medication. Talk to your care team if you or your partner may be pregnant. Serious birth defects can occur if you take this medication during pregnancy and for 6 months after the last dose. You will need a negative pregnancy test before starting this medication. Contraception is recommended while taking this medication and for 6 months after the last dose. Your care team can help you find the option that works for you. If your partner can get pregnant, use a condom during sex while taking this medication and for 3 months after the last dose. Do not breastfeed while taking this medication and for 2 weeks after the last dose. This medication may cause infertility. Talk to your care team if you are concerned about your fertility. What side effects may I notice from receiving this  medication? Side effects that you should report to your care team as soon as possible: Allergic reactions--skin rash, itching, hives, swelling of the face, lips, tongue, or throat Dry cough, shortness of breath or trouble breathing Infection--fever, chills, cough, sore throat, wounds that don't heal, pain or trouble when passing urine, general feeling of discomfort or being unwell Low red blood cell level--unusual weakness or fatigue, dizziness, headache, trouble breathing Pain, tingling, or numbness in the hands or feet Stomach pain, unusual weakness or fatigue, nausea, vomiting, diarrhea, or fever that lasts longer than expected Unusual bruising or bleeding Side effects that usually do not require medical attention (report to your care team if they continue or are bothersome): Diarrhea Fatigue Hair loss Loss of appetite Nausea Vomiting This list may not  describe all possible side effects. Call your doctor for medical advice about side effects. You may report side effects to FDA at 1-800-FDA-1088. Where should I keep my medication? This medication is given in a hospital or clinic. It will not be stored at home. NOTE: This sheet is a summary. It may not cover all possible information. If you have questions about this medicine, talk to your doctor, pharmacist, or health care provider.  2024 Elsevier/Gold Standard (2022-02-08 00:00:00)    To help prevent nausea and vomiting after your treatment, we encourage you to take your nausea medication as directed.  BELOW ARE SYMPTOMS THAT SHOULD BE REPORTED IMMEDIATELY: *FEVER GREATER THAN 100.4 F (38 C) OR HIGHER *CHILLS OR SWEATING *NAUSEA AND VOMITING THAT IS NOT CONTROLLED WITH YOUR NAUSEA MEDICATION *UNUSUAL SHORTNESS OF BREATH *UNUSUAL BRUISING OR BLEEDING *URINARY PROBLEMS (pain or burning when urinating, or frequent urination) *BOWEL PROBLEMS (unusual diarrhea, constipation, pain near the anus) TENDERNESS IN MOUTH AND THROAT WITH OR WITHOUT PRESENCE OF ULCERS (sore throat, sores in mouth, or a toothache) UNUSUAL RASH, SWELLING OR PAIN  UNUSUAL VAGINAL DISCHARGE OR ITCHING   Items with * indicate a potential emergency and should be followed up as soon as possible or go to the Emergency Department if any problems should occur.  Please show the CHEMOTHERAPY ALERT CARD or IMMUNOTHERAPY ALERT CARD at check-in to the Emergency Department and triage nurse.  Should you have questions after your visit or need to cancel or reschedule your appointment, please contact [facility] - A DEPT OF [facility]  Dept: [phone]  and follow the prompts.  Office hours are 8:00 a.m. to 4:30 p.m. Monday - Friday. Please note that voicemails left after 4:00 p.m. may not be returned until the following business day.  We are closed weekends and major holidays. You have access to a  nurse at all times for urgent questions. Please call the main number to the clinic Dept: [phone] and follow the prompts.   For any non-urgent questions, you may also contact your provider using MyChart. We now offer e-Visits for anyone 25 and older to request care online for non-urgent symptoms. For details visit [email or web address].   Also download the MyChart app! Go to the app store, search MyChart, open the app, select [place], and log in with your MyChart username and password.

## 2024-05-28 NOTE — Patient Instructions (Signed)

## 2024-05-29 LAB — CANCER ANTIGEN 19-9: CA 19-9: 416 U/mL — ABNORMAL HIGH (ref 0–35)

## 2024-05-30 ENCOUNTER — Encounter: Payer: Self-pay | Admitting: Nurse Practitioner

## 2024-06-01 ENCOUNTER — Other Ambulatory Visit (HOSPITAL_BASED_OUTPATIENT_CLINIC_OR_DEPARTMENT_OTHER): Payer: Self-pay

## 2024-06-01 ENCOUNTER — Encounter: Payer: Self-pay | Admitting: Oncology

## 2024-06-01 ENCOUNTER — Other Ambulatory Visit: Payer: Self-pay | Admitting: *Deleted

## 2024-06-01 MED ORDER — ZOLPIDEM TARTRATE 10 MG PO TABS: 10.0000 mg | ORAL_TABLET | Freq: Every evening | ORAL | 1 refills | Status: DC | PRN

## 2024-06-01 MED ORDER — ZOLPIDEM TARTRATE 10 MG PO TABS
10.0000 mg | ORAL_TABLET | Freq: Every evening | ORAL | 1 refills | Status: DC | PRN
  Filled 2024-06-01: qty 30, 30d supply, fill #0
  Filled 2024-07-01: qty 30, 30d supply, fill #1

## 2024-06-01 NOTE — Telephone Encounter (Signed)
 Asking for refill on ambien  10 mg at [facility].

## 2024-06-04 ENCOUNTER — Ambulatory Visit

## 2024-06-04 ENCOUNTER — Other Ambulatory Visit

## 2024-06-04 ENCOUNTER — Ambulatory Visit: Admitting: Nurse Practitioner

## 2024-06-07 ENCOUNTER — Other Ambulatory Visit: Payer: Self-pay | Admitting: Oncology

## 2024-06-10 ENCOUNTER — Other Ambulatory Visit (HOSPITAL_BASED_OUTPATIENT_CLINIC_OR_DEPARTMENT_OTHER): Payer: Self-pay

## 2024-06-11 ENCOUNTER — Other Ambulatory Visit (HOSPITAL_BASED_OUTPATIENT_CLINIC_OR_DEPARTMENT_OTHER): Payer: Self-pay

## 2024-06-12 ENCOUNTER — Inpatient Hospital Stay

## 2024-06-12 ENCOUNTER — Encounter: Payer: Self-pay | Admitting: Oncology

## 2024-06-12 ENCOUNTER — Inpatient Hospital Stay: Attending: Oncology

## 2024-06-12 ENCOUNTER — Inpatient Hospital Stay (HOSPITAL_BASED_OUTPATIENT_CLINIC_OR_DEPARTMENT_OTHER): Admitting: Oncology

## 2024-06-12 VITALS — BP 131/69 | HR 79 | Resp 18

## 2024-06-12 VITALS — BP 119/69 | HR 72 | Temp 98.2°F | Resp 18 | Ht 66.0 in | Wt 230.2 lb

## 2024-06-12 DIAGNOSIS — C770 Secondary and unspecified malignant neoplasm of lymph nodes of head, face and neck: Secondary | ICD-10-CM | POA: Insufficient documentation

## 2024-06-12 DIAGNOSIS — Z5111 Encounter for antineoplastic chemotherapy: Secondary | ICD-10-CM | POA: Diagnosis not present

## 2024-06-12 DIAGNOSIS — C250 Malignant neoplasm of head of pancreas: Secondary | ICD-10-CM

## 2024-06-12 LAB — CBC WITH DIFFERENTIAL (CANCER CENTER ONLY)
Abs Immature Granulocytes: 0.19 K/uL — ABNORMAL HIGH (ref 0.00–0.07)
Basophils Absolute: 0.1 K/uL (ref 0.0–0.1)
Basophils Relative: 1 %
Eosinophils Absolute: 0.1 K/uL (ref 0.0–0.5)
Eosinophils Relative: 2 %
HCT: 38.8 % — ABNORMAL LOW (ref 39.0–52.0)
Hemoglobin: 13 g/dL (ref 13.0–17.0)
Immature Granulocytes: 2 %
Lymphocytes Relative: 20 %
Lymphs Abs: 1.7 K/uL (ref 0.7–4.0)
MCH: 33.2 pg (ref 26.0–34.0)
MCHC: 33.5 g/dL (ref 30.0–36.0)
MCV: 99.2 fL (ref 80.0–100.0)
Monocytes Absolute: 1.2 K/uL — ABNORMAL HIGH (ref 0.1–1.0)
Monocytes Relative: 14 %
Neutro Abs: 5.3 K/uL (ref 1.7–7.7)
Neutrophils Relative %: 61 %
Platelet Count: 249 K/uL (ref 150–400)
RBC: 3.91 MIL/uL — ABNORMAL LOW (ref 4.22–5.81)
RDW: 14.7 % (ref 11.5–15.5)
WBC Count: 8.5 K/uL (ref 4.0–10.5)
nRBC: 0 % (ref 0.0–0.2)

## 2024-06-12 LAB — CMP (CANCER CENTER ONLY)
ALT: 26 U/L (ref 0–44)
AST: 21 U/L (ref 15–41)
Albumin: 3.6 g/dL (ref 3.5–5.0)
Alkaline Phosphatase: 58 U/L (ref 38–126)
Anion gap: 13 (ref 5–15)
BUN: 19 mg/dL (ref 8–23)
CO2: 22 mmol/L (ref 22–32)
Calcium: 9.5 mg/dL (ref 8.9–10.3)
Chloride: 103 mmol/L (ref 98–111)
Creatinine: 1.08 mg/dL (ref 0.61–1.24)
GFR, Estimated: >60 mL/min (ref >60)
Glucose, Bld: 142 mg/dL — ABNORMAL HIGH (ref 70–99)
Potassium: 3.8 mmol/L (ref 3.5–5.1)
Sodium: 139 mmol/L (ref 135–145)
Total Bilirubin: 0.4 mg/dL (ref 0.0–1.2)
Total Protein: 5.8 g/dL — ABNORMAL LOW (ref 6.5–8.1)

## 2024-06-12 MED ORDER — SODIUM CHLORIDE 0.9 % IV SOLN: INTRAVENOUS | Status: DC

## 2024-06-12 MED ORDER — PACLITAXEL PROTEIN-BOUND CHEMO INJECTION 100 MG
100.0000 mg/m2 | Freq: Once | INTRAVENOUS | Status: AC
  Administered 2024-06-12: 225 mg via INTRAVENOUS
  Filled 2024-06-12: qty 45

## 2024-06-12 MED ORDER — SODIUM CHLORIDE 0.9 % IV SOLN
2200.0000 mg | Freq: Once | INTRAVENOUS | Status: AC
  Administered 2024-06-12: 2200 mg via INTRAVENOUS
  Filled 2024-06-12: qty 52.57

## 2024-06-12 NOTE — Progress Notes (Signed)
 Mr. Blossom made aware of CT scan at [facility] radiology on 9/16 at 5:15. Arrive at 4:45 for radiology to access port

## 2024-06-12 NOTE — Patient Instructions (Signed)
 [facility] - A DEPT OF . [facility]  Discharge Instructions: Thank you for choosing [facility] to provide your oncology and hematology care.   If you have a lab appointment with the Cancer Center, please go directly to the Cancer Center and check in at the registration area.   Wear comfortable clothing and clothing appropriate for easy access to any Portacath or PICC line.   We strive to give you quality time with your provider. You may need to reschedule your appointment if you arrive late (15 or more minutes).  Arriving late affects you and other patients whose appointments are after yours.  Also, if you miss three or more appointments without notifying the office, you may be dismissed from the clinic at the provider's discretion.      For prescription refill requests, have your pharmacy contact our office and allow 72 hours for refills to be completed.    Today you received the following chemotherapy and/or immunotherapy agents: abraxane , gemzar  .  Gemcitabine  Injection What is this medication? GEMCITABINE  (jem SYE ta been) treats some types of cancer. It works by slowing down the growth of cancer cells. This medicine may be used for other purposes; ask your health care provider or pharmacist if you have questions. COMMON BRAND NAME(S): Gemzar , Infugem  What should I tell my care team before I take this medication? They need to know if you have any of these conditions: Blood disorders Infection Kidney disease Liver disease Lung or breathing disease, such as asthma or COPD Recent or ongoing radiation therapy An unusual or allergic reaction to gemcitabine , other medications, foods, dyes, or preservatives If you or your partner are pregnant or trying to get pregnant Breast-feeding How should I use this medication? This medication is injected into a vein. It is given by your care team in a hospital or clinic setting. Talk to your care team  about the use of this medication in children. Special care may be needed. Overdosage: If you think you have taken too much of this medicine contact a poison control center or emergency room at once. NOTE: This medicine is only for you. Do not share this medicine with others. What if I miss a dose? Keep appointments for follow-up doses. It is important not to miss your dose. Call your care team if you are unable to keep an appointment. What may interact with this medication? Interactions have not been studied. This list may not describe all possible interactions. Give your health care provider a list of all the medicines, herbs, non-prescription drugs, or dietary supplements you use. Also tell them if you smoke, drink alcohol, or use illegal drugs. Some items may interact with your medicine. What should I watch for while using this medication? Your condition will be monitored carefully while you are receiving this medication. This medication may make you feel generally unwell. This is not uncommon, as chemotherapy can affect healthy cells as well as cancer cells. Report any side effects. Continue your course of treatment even though you feel ill unless your care team tells you to stop. In some cases, you may be given additional medications to help with side effects. Follow all directions for their use. This medication may increase your risk of getting an infection. Call your care team for advice if you get a fever, chills, sore throat, or other symptoms of a cold or flu. Do not treat yourself. Try to avoid being around people who are sick. This medication  may increase your risk to bruise or bleed. Call your care team if you notice any unusual bleeding. Be careful brushing or flossing your teeth or using a toothpick because you may get an infection or bleed more easily. If you have any dental work done, tell your dentist you are receiving this medication. Avoid taking medications that contain aspirin ,  acetaminophen , ibuprofen , naproxen, or ketoprofen unless instructed by your care team. These medications may hide a fever. Talk to your care team if you or your partner wish to become pregnant or think you might be pregnant. This medication can cause serious birth defects if taken during pregnancy and for 6 months after the last dose. A negative pregnancy test is required before starting this medication. A reliable form of contraception is recommended while taking this medication and for 6 months after the last dose. Talk to your care team about effective forms of contraception. Do not father a child while taking this medication and for 3 months after the last dose. Use a condom while having sex during this time period. Do not breastfeed while taking this medication and for at least 1 week after the last dose. This medication may cause infertility. Talk to your care team if you are concerned about your fertility. What side effects may I notice from receiving this medication? Side effects that you should report to your care team as soon as possible: Allergic reactions--skin rash, itching, hives, swelling of the face, lips, tongue, or throat Capillary leak syndrome--stomach or muscle pain, unusual weakness or fatigue, feeling faint or lightheaded, decrease in the amount of urine, swelling of the ankles, hands, or feet, trouble breathing Infection--fever, chills, cough, sore throat, wounds that don't heal, pain or trouble when passing urine, general feeling of discomfort or being unwell Liver injury--right upper belly pain, loss of appetite, nausea, light-colored stool, dark yellow or brown urine, yellowing skin or eyes, unusual weakness or fatigue Low red blood cell level--unusual weakness or fatigue, dizziness, headache, trouble breathing Lung injury--shortness of breath or trouble breathing, cough, spitting up blood, chest pain, fever Stomach pain, bloody diarrhea, pale skin, unusual weakness or fatigue,  decrease in the amount of urine, which may be signs of hemolytic uremic syndrome Sudden and severe headache, confusion, change in vision, seizures, which may be signs of posterior reversible encephalopathy syndrome (PRES) Unusual bruising or bleeding Side effects that usually do not require medical attention (report to your care team if they continue or are bothersome): Diarrhea Drowsiness Hair loss Nausea Pain, redness, or swelling with sores inside the mouth or throat Vomiting This list may not describe all possible side effects. Call your doctor for medical advice about side effects. You may report side effects to FDA at 1-800-FDA-1088. Where should I keep my medication? This medication is given in a hospital or clinic. It will not be stored at home. NOTE: This sheet is a summary. It may not cover all possible information. If you have questions about this medicine, talk to your doctor, pharmacist, or health care provider.  2024 Elsevier/Gold Standard (2022-01-30 00:00:00)  Paclitaxel  Nanoparticle Albumin -Bound Injection What is this medication? NANOPARTICLE ALBUMIN -BOUND PACLITAXEL  (Na no PAHR ti kuhl al BYOO muhn-bound PAK li TAX el) treats some types of cancer. It works by slowing down the growth of cancer cells. This medicine may be used for other purposes; ask your health care provider or pharmacist if you have questions. COMMON BRAND NAME(S): Abraxane  What should I tell my care team before I take this medication? They need  to know if you have any of these conditions: Liver disease Low white blood cell levels An unusual or allergic reaction to paclitaxel , albumin , other medications, foods, dyes, or preservatives If you or your partner are pregnant or trying to get pregnant Breast-feeding How should I use this medication? This medication is injected into a vein. It is given by your care team in a hospital or clinic setting. Talk to your care team about the use of this medication  in children. Special care may be needed. Overdosage: If you think you have taken too much of this medicine contact a poison control center or emergency room at once. NOTE: This medicine is only for you. Do not share this medicine with others. What if I miss a dose? Keep appointments for follow-up doses. It is important not to miss your dose. Call your care team if you are unable to keep an appointment. What may interact with this medication? Other medications may affect the way this medication works. Talk with your care team about all of the medications you take. They may suggest changes to your treatment plan to lower the risk of side effects and to make sure your medications work as intended. This list may not describe all possible interactions. Give your health care provider a list of all the medicines, herbs, non-prescription drugs, or dietary supplements you use. Also tell them if you smoke, drink alcohol, or use illegal drugs. Some items may interact with your medicine. What should I watch for while using this medication? Your condition will be monitored carefully while you are receiving this medication. You may need blood work while taking this medication. This medication may make you feel generally unwell. This is not uncommon as chemotherapy can affect healthy cells as well as cancer cells. Report any side effects. Continue your course of treatment even though you feel ill unless your care team tells you to stop. This medication can cause serious allergic reactions. To reduce the risk, your care team may give you other medications to take before receiving this one. Be sure to follow the directions from your care team. This medication may increase your risk of getting an infection. Call your care team for advice if you get a fever, chills, sore throat, or other symptoms of a cold or flu. Do not treat yourself. Try to avoid being around people who are sick. This medication may increase your risk  to bruise or bleed. Call your care team if you notice any unusual bleeding. Be careful brushing or flossing your teeth or using a toothpick because you may get an infection or bleed more easily. If you have any dental work done, tell your dentist you are receiving this medication. Talk to your care team if you or your partner may be pregnant. Serious birth defects can occur if you take this medication during pregnancy and for 6 months after the last dose. You will need a negative pregnancy test before starting this medication. Contraception is recommended while taking this medication and for 6 months after the last dose. Your care team can help you find the option that works for you. If your partner can get pregnant, use a condom during sex while taking this medication and for 3 months after the last dose. Do not breastfeed while taking this medication and for 2 weeks after the last dose. This medication may cause infertility. Talk to your care team if you are concerned about your fertility. What side effects may I notice from receiving this  medication? Side effects that you should report to your care team as soon as possible: Allergic reactions--skin rash, itching, hives, swelling of the face, lips, tongue, or throat Dry cough, shortness of breath or trouble breathing Infection--fever, chills, cough, sore throat, wounds that don't heal, pain or trouble when passing urine, general feeling of discomfort or being unwell Low red blood cell level--unusual weakness or fatigue, dizziness, headache, trouble breathing Pain, tingling, or numbness in the hands or feet Stomach pain, unusual weakness or fatigue, nausea, vomiting, diarrhea, or fever that lasts longer than expected Unusual bruising or bleeding Side effects that usually do not require medical attention (report to your care team if they continue or are bothersome): Diarrhea Fatigue Hair loss Loss of appetite Nausea Vomiting This list may not  describe all possible side effects. Call your doctor for medical advice about side effects. You may report side effects to FDA at 1-800-FDA-1088. Where should I keep my medication? This medication is given in a hospital or clinic. It will not be stored at home. NOTE: This sheet is a summary. It may not cover all possible information. If you have questions about this medicine, talk to your doctor, pharmacist, or health care provider.  2024 Elsevier/Gold Standard (2022-02-08 00:00:00)    To help prevent nausea and vomiting after your treatment, we encourage you to take your nausea medication as directed.  BELOW ARE SYMPTOMS THAT SHOULD BE REPORTED IMMEDIATELY: *FEVER GREATER THAN 100.4 F (38 C) OR HIGHER *CHILLS OR SWEATING *NAUSEA AND VOMITING THAT IS NOT CONTROLLED WITH YOUR NAUSEA MEDICATION *UNUSUAL SHORTNESS OF BREATH *UNUSUAL BRUISING OR BLEEDING *URINARY PROBLEMS (pain or burning when urinating, or frequent urination) *BOWEL PROBLEMS (unusual diarrhea, constipation, pain near the anus) TENDERNESS IN MOUTH AND THROAT WITH OR WITHOUT PRESENCE OF ULCERS (sore throat, sores in mouth, or a toothache) UNUSUAL RASH, SWELLING OR PAIN  UNUSUAL VAGINAL DISCHARGE OR ITCHING   Items with * indicate a potential emergency and should be followed up as soon as possible or go to the Emergency Department if any problems should occur.  Please show the CHEMOTHERAPY ALERT CARD or IMMUNOTHERAPY ALERT CARD at check-in to the Emergency Department and triage nurse.  Should you have questions after your visit or need to cancel or reschedule your appointment, please contact [facility] - A DEPT OF [facility]  Dept: [phone]  and follow the prompts.  Office hours are 8:00 a.m. to 4:30 p.m. Monday - Friday. Please note that voicemails left after 4:00 p.m. may not be returned until the following business day.  We are closed weekends and major holidays. You have access to a  nurse at all times for urgent questions. Please call the main number to the clinic Dept: [phone] and follow the prompts.   For any non-urgent questions, you may also contact your provider using MyChart. We now offer e-Visits for anyone 25 and older to request care online for non-urgent symptoms. For details visit [email or web address].   Also download the MyChart app! Go to the app store, search MyChart, open the app, select [place], and log in with your MyChart username and password.

## 2024-06-12 NOTE — Progress Notes (Signed)
 Patient seen by Dr. Arley Hof today  Vitals are within treatment parameters:Yes   Labs are within treatment parameters: Yes   Treatment plan has been signed: Yes   Per physician team, Patient is ready for treatment and there are NO modifications to the treatment plan.

## 2024-06-12 NOTE — Progress Notes (Signed)
 [facility] OFFICE PROGRESS NOTE   Diagnosis: Pancreas cancer  INTERVAL HISTORY:   Timothy Wilcox returns as scheduled.  He completed another cycle of gemcitabine /Abraxane  05/28/2024.  No fever or rash.  He has intermittent numbness in the lower extremities.  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  He feels much better since starting prednisone .  No pain.  He continues apixaban  anticoagulation  Objective:  Vital signs in last 24 hours:  Blood pressure 119/69, pulse 72, temperature 98.2 F (36.8 C), temperature source Temporal, resp. rate 18, height 5' 6 (1.676 m), weight 230 lb 3.2 oz (104.4 kg), SpO2 98%.    HEENT: No thrush or ulcers Resp: Lungs clear bilaterally Cardio: Regular rate and rhythm GI: No hepatosplenomegaly, no mass, nontender Vascular: Trace lower leg edema bilaterally    Portacath/PICC-without erythema  Lab Results:  Lab Results  Component Value Date   WBC 8.5 06/12/2024   HGB 13.0 06/12/2024   HCT 38.8 (L) 06/12/2024   MCV 99.2 06/12/2024   PLT 249 06/12/2024   NEUTROABS 5.3 06/12/2024    CMP  Lab Results  Component Value Date   NA 139 06/12/2024   K 3.8 06/12/2024   CL 103 06/12/2024   CO2 22 06/12/2024   GLUCOSE 142 (H) 06/12/2024   BUN 19 06/12/2024   CREATININE 1.08 06/12/2024   CALCIUM  9.5 06/12/2024   PROT 5.8 (L) 06/12/2024   ALBUMIN  3.6 06/12/2024   AST 21 06/12/2024   ALT 26 06/12/2024   ALKPHOS 58 06/12/2024   BILITOT 0.4 06/12/2024   GFRNONAA >60 06/12/2024   GFRAA >60 11/08/2018    Lab Results  Component Value Date   RJW800 416 (H) 05/28/2024     Medications: I have reviewed the patient's current medications.   Assessment/Plan: Pancreas cancer 11/19/2022: 2.9 cm thin-walled cystic lesion in the pancreas uncinate 07/28/2023: CT abdomen/pelvis: Stable 2.8 cm cystic area in the uncinate process 11/29/2023: CT chest-subsegmental and segmental pulmonary emboli in the left upper lobe, dilation of the right  ventricle, multiple pulmonary nodules 12/26/2023: CA 19-9- 66,286 12/27/2023: PET-5 cm hypermetabolic pancreas head/uncinate mass, numerous hypermetabolic pulmonary nodules, hypermetabolic left supraclavicular and retroperitoneal lymph nodes 01/06/2024: Left supraclavicular lymph node biopsy-metastatic moderately differentiated adenocarcinoma, 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, upper GI, breast, and salivary gland carcinoma Foundation 1 01/06/2024-HRDsig negative, microsatellite stable, tumor mutation burden 2, K-ras G12R Cycle 1 gemcitabine /Abraxane  01/16/2024 Cycle 2 gemcitabine /Abraxane  01/30/2024 Cycle 3 gemcitabine /Abraxane  02/13/2024 Cycle 4 gemcitabine /Abraxane  02/27/2024 Cycle 5 gemcitabine /Abraxane  03/12/2024 03/23/2024 CTs: Decrease size of pancreas head/uncinate mass, mixed response involving bilateral pulmonary nodules, decreased left supraclavicular retroperitoneal lymph nodes Cycle 6 gemcitabine /Abraxane  03/27/2024 Cycle 7 gemcitabine /Abraxane  04/09/2024 Cycle 8 gemcitabine /Abraxane  04/23/2024 Cycle 9 gemcitabine /Abraxane  05/07/2024 Cycle 10 gemcitabine /Abraxane  05/28/2024 Cycle 11 gemcitabine /Abraxane  06/12/2024 Pulmonary embolism 11/29/2023 Small segmental and subsegmental pulmonary emboli in the left upper lobe, dilation of right ventricle Admitted for heparin  anticoagulation followed by apixaban -discharged 12/01/2023 Lower extremity Dopplers 11/30/2023: Acute left posterior tibial, peroneal, and TP trunk DVTs, acute right peroneal DVT Lovenox  12/31/2023 Lovenox  discontinued, Eliquis  initiated 04/14/2024   3.  CVAs-visual disturbance and altered mental status 12/30/2023 MRI brain-many foci of restricted diffusion in the cerebellum, left frontal, left basal ganglia, and parietal lobes compatible with acute/subacute infarcts, consider embolic etiology 4.  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  Kidney stones 6.  Gout 7.  Family history of pancreas  cancer 8.  History of basal cell carcinoma 9.  Negative genetic testing with VUS in EGFR.  First-degree  relatives are candidates for considering pancreatic cancer screening based on family history.  Discussed with patient and wife 01/30/2024.        Disposition: Timothy Wilcox appears unchanged.  He reports an improved performance status since beginning prednisone .  He will continue prednisone .  Restaging CTs were ordered for prior to today's visit, but have not been scheduled.  He will undergo restaging CTs prior to an office visit in 2 weeks.  He will complete another cycle of gemcitabine /Abraxane  today.  Timothy Hof, MD  06/12/2024  12:15 PM

## 2024-06-13 ENCOUNTER — Other Ambulatory Visit: Payer: Self-pay

## 2024-06-20 ENCOUNTER — Encounter: Payer: Self-pay | Admitting: Nurse Practitioner

## 2024-06-21 ENCOUNTER — Other Ambulatory Visit: Payer: Self-pay | Admitting: Oncology

## 2024-06-22 ENCOUNTER — Other Ambulatory Visit (HOSPITAL_BASED_OUTPATIENT_CLINIC_OR_DEPARTMENT_OTHER): Payer: Self-pay

## 2024-06-22 DIAGNOSIS — G4733 Obstructive sleep apnea (adult) (pediatric): Secondary | ICD-10-CM | POA: Diagnosis not present

## 2024-06-23 ENCOUNTER — Encounter

## 2024-06-23 ENCOUNTER — Ambulatory Visit (HOSPITAL_BASED_OUTPATIENT_CLINIC_OR_DEPARTMENT_OTHER)
Admission: RE | Admit: 2024-06-23 | Discharge: 2024-06-23 | Disposition: A | Discharge status: Home / Self Care | Source: Ambulatory Visit | Attending: Nurse Practitioner | Admitting: Nurse Practitioner

## 2024-06-23 DIAGNOSIS — C259 Malignant neoplasm of pancreas, unspecified: Secondary | ICD-10-CM | POA: Diagnosis not present

## 2024-06-23 DIAGNOSIS — I7143 Infrarenal abdominal aortic aneurysm, without rupture: Secondary | ICD-10-CM | POA: Diagnosis not present

## 2024-06-23 DIAGNOSIS — C250 Malignant neoplasm of head of pancreas: Secondary | ICD-10-CM | POA: Diagnosis not present

## 2024-06-23 DIAGNOSIS — I700 Atherosclerosis of aorta: Secondary | ICD-10-CM | POA: Diagnosis not present

## 2024-06-23 MED ORDER — HEPARIN SOD (PORK) LOCK FLUSH 100 UNIT/ML IV SOLN
500.0000 [IU] | Freq: Once | INTRAVENOUS | Status: AC
Start: 2024-06-23 — End: 2024-06-23
  Administered 2024-06-23: 500 [IU] via INTRAVENOUS

## 2024-06-23 MED ORDER — IOHEXOL 300 MG/ML  SOLN
100.0000 mL | Freq: Once | INTRAMUSCULAR | Status: AC | PRN
Start: 2024-06-23 — End: 2024-06-23
  Administered 2024-06-23: 100 mL via INTRAVENOUS

## 2024-06-24 DIAGNOSIS — R200 Anesthesia of skin: Secondary | ICD-10-CM | POA: Diagnosis not present

## 2024-06-24 DIAGNOSIS — M7918 Myalgia, other site: Secondary | ICD-10-CM | POA: Diagnosis not present

## 2024-06-24 DIAGNOSIS — R202 Paresthesia of skin: Secondary | ICD-10-CM | POA: Diagnosis not present

## 2024-06-24 DIAGNOSIS — M47816 Spondylosis without myelopathy or radiculopathy, lumbar region: Secondary | ICD-10-CM | POA: Diagnosis not present

## 2024-06-25 ENCOUNTER — Encounter: Payer: Self-pay | Admitting: Nurse Practitioner

## 2024-06-25 ENCOUNTER — Inpatient Hospital Stay

## 2024-06-25 ENCOUNTER — Inpatient Hospital Stay (HOSPITAL_BASED_OUTPATIENT_CLINIC_OR_DEPARTMENT_OTHER): Admitting: Nurse Practitioner

## 2024-06-25 VITALS — BP 132/65 | HR 86 | Temp 97.8°F | Resp 18 | Ht 66.0 in | Wt 234.9 lb

## 2024-06-25 VITALS — BP 131/78 | HR 78 | Temp 98.1°F | Resp 18

## 2024-06-25 DIAGNOSIS — Z5111 Encounter for antineoplastic chemotherapy: Secondary | ICD-10-CM | POA: Diagnosis not present

## 2024-06-25 DIAGNOSIS — C250 Malignant neoplasm of head of pancreas: Secondary | ICD-10-CM

## 2024-06-25 DIAGNOSIS — C770 Secondary and unspecified malignant neoplasm of lymph nodes of head, face and neck: Secondary | ICD-10-CM | POA: Diagnosis not present

## 2024-06-25 LAB — CBC WITH DIFFERENTIAL (CANCER CENTER ONLY)
Abs Immature Granulocytes: 0.18 K/uL — ABNORMAL HIGH (ref 0.00–0.07)
Basophils Absolute: 0 K/uL (ref 0.0–0.1)
Basophils Relative: 1 %
Eosinophils Absolute: 0.1 K/uL (ref 0.0–0.5)
Eosinophils Relative: 2 %
HCT: 36.7 % — ABNORMAL LOW (ref 39.0–52.0)
Hemoglobin: 12.2 g/dL — ABNORMAL LOW (ref 13.0–17.0)
Immature Granulocytes: 3 %
Lymphocytes Relative: 11 %
Lymphs Abs: 0.8 K/uL (ref 0.7–4.0)
MCH: 33.1 pg (ref 26.0–34.0)
MCHC: 33.2 g/dL (ref 30.0–36.0)
MCV: 99.5 fL (ref 80.0–100.0)
Monocytes Absolute: 0.7 K/uL (ref 0.1–1.0)
Monocytes Relative: 10 %
Neutro Abs: 5.2 K/uL (ref 1.7–7.7)
Neutrophils Relative %: 73 %
Platelet Count: 171 K/uL (ref 150–400)
RBC: 3.69 MIL/uL — ABNORMAL LOW (ref 4.22–5.81)
RDW: 15.1 % (ref 11.5–15.5)
WBC Count: 7 K/uL (ref 4.0–10.5)
nRBC: 0 % (ref 0.0–0.2)

## 2024-06-25 LAB — CMP (CANCER CENTER ONLY)
ALT: 35 U/L (ref 0–44)
AST: 35 U/L (ref 15–41)
Albumin: 3.4 g/dL — ABNORMAL LOW (ref 3.5–5.0)
Alkaline Phosphatase: 59 U/L (ref 38–126)
Anion gap: 15 (ref 5–15)
BUN: 15 mg/dL (ref 8–23)
CO2: 21 mmol/L — ABNORMAL LOW (ref 22–32)
Calcium: 9.2 mg/dL (ref 8.9–10.3)
Chloride: 102 mmol/L (ref 98–111)
Creatinine: 0.91 mg/dL (ref 0.61–1.24)
GFR, Estimated: >60 mL/min (ref >60)
Glucose, Bld: 198 mg/dL — ABNORMAL HIGH (ref 70–99)
Potassium: 3.6 mmol/L (ref 3.5–5.1)
Sodium: 138 mmol/L (ref 135–145)
Total Bilirubin: 0.3 mg/dL (ref 0.0–1.2)
Total Protein: 5.7 g/dL — ABNORMAL LOW (ref 6.5–8.1)

## 2024-06-25 MED ORDER — SODIUM CHLORIDE 0.9 % IV SOLN: INTRAVENOUS | Status: DC

## 2024-06-25 MED ORDER — SODIUM CHLORIDE 0.9 % IV SOLN
2200.0000 mg | Freq: Once | INTRAVENOUS | Status: AC
  Administered 2024-06-25: 2200 mg via INTRAVENOUS
  Filled 2024-06-25: qty 52.57

## 2024-06-25 MED ORDER — PACLITAXEL PROTEIN-BOUND CHEMO INJECTION 100 MG
100.0000 mg/m2 | Freq: Once | INTRAVENOUS | Status: AC
  Administered 2024-06-25: 225 mg via INTRAVENOUS
  Filled 2024-06-25: qty 45

## 2024-06-25 NOTE — Patient Instructions (Signed)
 [facility] - A DEPT OF [facility]  Discharge Instructions: Thank you for choosing [facility] to provide your oncology and hematology care.   If you have a lab appointment with the Cancer Center, please go directly to the Cancer Center and check in at the registration area.   Wear comfortable clothing and clothing appropriate for easy access to any Portacath or PICC line.   We strive to give you quality time with your provider. You may need to reschedule your appointment if you arrive late (15 or more minutes).  Arriving late affects you and other patients whose appointments are after yours.  Also, if you miss three or more appointments without notifying the office, you may be dismissed from the clinic at the provider's discretion.      For prescription refill requests, have your pharmacy contact our office and allow 72 hours for refills to be completed.    Today you received the following chemotherapy and/or immunotherapy agents: abraxane , gemzar       To help prevent nausea and vomiting after your treatment, we encourage you to take your nausea medication as directed.  BELOW ARE SYMPTOMS THAT SHOULD BE REPORTED IMMEDIATELY: *FEVER GREATER THAN 100.4 F (38 C) OR HIGHER *CHILLS OR SWEATING *NAUSEA AND VOMITING THAT IS NOT CONTROLLED WITH YOUR NAUSEA MEDICATION *UNUSUAL SHORTNESS OF BREATH *UNUSUAL BRUISING OR BLEEDING *URINARY PROBLEMS (pain or burning when urinating, or frequent urination) *BOWEL PROBLEMS (unusual diarrhea, constipation, pain near the anus) TENDERNESS IN MOUTH AND THROAT WITH OR WITHOUT PRESENCE OF ULCERS (sore throat, sores in mouth, or a toothache) UNUSUAL RASH, SWELLING OR PAIN  UNUSUAL VAGINAL DISCHARGE OR ITCHING   Items with * indicate a potential emergency and should be followed up as soon as possible or go to the Emergency Department if any problems should occur.  Please show the CHEMOTHERAPY ALERT CARD or  IMMUNOTHERAPY ALERT CARD at check-in to the Emergency Department and triage nurse.  Should you have questions after your visit or need to cancel or reschedule your appointment, please contact [facility] - A DEPT OF [facility]  Dept: [phone]  and follow the prompts.  Office hours are 8:00 a.m. to 4:30 p.m. Monday - Friday. Please note that voicemails left after 4:00 p.m. may not be returned until the following business day.  We are closed weekends and major holidays. You have access to a nurse at all times for urgent questions. Please call the main number to the clinic Dept: [phone] and follow the prompts.   For any non-urgent questions, you may also contact your provider using MyChart. We now offer e-Visits for anyone 72 and older to request care online for non-urgent symptoms. For details visit [email or web address].   Also download the MyChart app! Go to the app store, search MyChart, open the app, select [facility], and log in with your MyChart username and password.

## 2024-06-25 NOTE — Progress Notes (Signed)
 [facility] OFFICE PROGRESS NOTE   Diagnosis: Pancreas cancer  INTERVAL HISTORY:   Timothy Wilcox returns as scheduled.  He completed cycle 11 gemcitabine /Abraxane  06/12/2024.  He denies nausea/vomiting.  No mouth sores.  No diarrhea.  No rash or fever following treatment.  He has had numbness from the legs to toes bilaterally for about the past 4 weeks.  He attributes this to spinal stenosis.  No numbness or tingling in the hands.  Appetite described as too good.  Dyspnea varies from day today.  Objective:  Vital signs in last 24 hours:  Blood pressure 132/65, pulse 86, temperature 97.8 F (36.6 C), temperature source Temporal, resp. rate 18, height 5' 6 (1.676 m), weight 234 lb 14.4 oz (106.5 kg), SpO2 97%.    HEENT: No thrush or ulcers. Resp: Lungs clear bilaterally. Cardio: Regular rate and rhythm. GI: No hepatosplenomegaly.  Nontender. Vascular: Trace lower leg edema bilaterally. Skin: No rash. Port-A-Cath without erythema.  Lab Results:  Lab Results  Component Value Date   WBC 7.0 06/25/2024   HGB 12.2 (L) 06/25/2024   HCT 36.7 (L) 06/25/2024   MCV 99.5 06/25/2024   PLT 171 06/25/2024   NEUTROABS 5.2 06/25/2024    Imaging:  CT CHEST ABDOMEN PELVIS W CONTRAST Result Date: 06/24/2024 CLINICAL DATA:  Pancreatic cancer, assess treatment response, restaging metastatic pancreatic cancer * Tracking Code: BO * EXAM: CT CHEST, ABDOMEN, AND PELVIS WITH CONTRAST TECHNIQUE: Multidetector CT imaging of the chest, abdomen and pelvis was performed following the standard protocol during bolus administration of intravenous contrast. RADIATION DOSE REDUCTION: This exam was performed according to the departmental dose-optimization program which includes automated exposure control, adjustment of the mA and/or kV according to patient size and/or use of iterative reconstruction technique. CONTRAST:  OMNIPAQUE  IOHEXOL  300 MG/ML  SOLN COMPARISON:  Multiple priors  including CT March 23, 2024 FINDINGS: CT CHEST FINDINGS Cardiovascular: Accessed right chest Port-A-Cath with tip near the superior cavoatrial junction. Aortic atherosclerosis. Calcifications of the mitral annulus. Coronary artery calcifications. Normal size heart. No significant pericardial effusion/thickening. Mediastinum/Nodes: No suspicious thyroid  nodule. Decreased size of the left supraclavicular lymph node now measuring 2 mm in short axis on image 5/2 previously 4 mm. No pathologically enlarged mediastinal, hilar or axillary lymph nodes. Small hiatal hernia with symmetric distal esophageal wall thickening. Lungs/Pleura: Increase in size of the numerous bilateral irregular pulmonary nodules. For reference: -right lower lobe pulmonary nodule measures 16 mm on image 117/3 previously 9 mm -subpleural left lower lobe pulmonary nodules measures 19 mm on image 104/3 previously 11 mm Musculoskeletal: No aggressive lytic or blastic lesion of bone. CT ABDOMEN PELVIS FINDINGS Hepatobiliary: No suspicious hepatic lesion. Gallbladder is nondistended. No biliary ductal dilation. Pancreas: Ill-defined mass in the head of the pancreas measures 4.7 x 2.9 cm on image 70/2 previously 4.3 cm. No pancreatic ductal dilation. Spleen: Calcified splenic granulomata. Adrenals/Urinary Tract: No suspicious adrenal nodule/mass. No hydronephrosis. Nonobstructive bilateral renal stones. Urinary bladder is minimally distended. Stomach/Bowel: Small hiatal hernia. Submucosal fibrofatty infiltration of the gastric antrum and proximal duodenum. No evidence of bowel obstruction. Left-sided colonic diverticulosis. Submucosal fibrofatty infiltration involving a loop of small bowel in the right hemiabdomen submucosal fibrofatty infiltration of the terminal ileum. Vascular/Lymphatic: Aorto bi-iliac atherosclerotic calcifications. Unchanged infrarenal abdominal aortic aneurysm measuring 3.9 cm. Retroperitoneal lymph nodes decreased in size for  reference a aortocaval lymph node measuring 8 mm in short axis on image 75/2 previously 11 mm. Reproductive: Prostate is unremarkable. Other: Mild mesenteric stranding. No overt  peritoneal or omental nodularity. Musculoskeletal: No aggressive lytic or blastic lesion of bone. IMPRESSION: 1. Increase in size of the numerous bilateral irregular pulmonary nodules, consistent with worsening metastatic disease. 2. Ill-defined mass in the head of the pancreas is similar in size. 3. Decreased size of the left supraclavicular and retroperitoneal lymph nodes. 4. Submucosal fibrofatty infiltration of the gastric antrum, proximal duodenum, terminal ileum and a loop of small bowel in the right hemiabdomen, which is nonspecific but can be seen in the setting of chronic inflammation. 5. Unchanged infrarenal abdominal aortic aneurysm measuring 3.9 cm. Electronically Signed   By: Reyes Holder M.D.   On: 06/24/2024 15:49    Medications: I have reviewed the patient's current medications.  Assessment/Plan: Pancreas cancer 11/19/2022: 2.9 cm thin-walled cystic lesion in the pancreas uncinate 07/28/2023: CT abdomen/pelvis: Stable 2.8 cm cystic area in the uncinate process 11/29/2023: CT chest-subsegmental and segmental pulmonary emboli in the left upper lobe, dilation of the right ventricle, multiple pulmonary nodules 12/26/2023: CA 19-9- 66,286 12/27/2023: PET-5 cm hypermetabolic pancreas head/uncinate mass, numerous hypermetabolic pulmonary nodules, hypermetabolic left supraclavicular and retroperitoneal lymph nodes 01/06/2024: Left supraclavicular lymph node biopsy-metastatic moderately differentiated adenocarcinoma, 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, upper GI, breast, and salivary gland carcinoma Foundation 1 01/06/2024-HRDsig negative, microsatellite stable, tumor mutation burden 2, K-ras G12R Cycle 1 gemcitabine /Abraxane  01/16/2024 Cycle 2 gemcitabine /Abraxane  01/30/2024 Cycle 3  gemcitabine /Abraxane  02/13/2024 Cycle 4 gemcitabine /Abraxane  02/27/2024 Cycle 5 gemcitabine /Abraxane  03/12/2024 03/23/2024 CTs: Decrease size of pancreas head/uncinate mass, mixed response involving bilateral pulmonary nodules, decreased left supraclavicular retroperitoneal lymph nodes Cycle 6 gemcitabine /Abraxane  03/27/2024 Cycle 7 gemcitabine /Abraxane  04/09/2024 Cycle 8 gemcitabine /Abraxane  04/23/2024 Cycle 9 gemcitabine /Abraxane  05/07/2024 Cycle 10 gemcitabine /Abraxane  05/28/2024 Cycle 11 gemcitabine /Abraxane  06/12/2024 CTs 06/23/2024-increase in size of numerous bilateral irregular pulmonary nodules.  Pancreas head mass is similar in size.  Decreased size of left supraclavicular and retroperitoneal lymph nodes.  Per review by Dr. Cloretta, overall stable. Cycle 12 gemcitabine /Abraxane  06/25/2024, repeat noncontrast chest CT at a 6-week interval Pulmonary embolism 11/29/2023 Small segmental and subsegmental pulmonary emboli in the left upper lobe, dilation of right ventricle Admitted for heparin  anticoagulation followed by apixaban -discharged 12/01/2023 Lower extremity Dopplers 11/30/2023: Acute left posterior tibial, peroneal, and TP trunk DVTs, acute right peroneal DVT Lovenox  12/31/2023 Lovenox  discontinued, Eliquis  initiated 04/14/2024   3.  CVAs-visual disturbance and altered mental status 12/30/2023 MRI brain-many foci of restricted diffusion in the cerebellum, left frontal, left basal ganglia, and parietal lobes compatible with acute/subacute infarcts, consider embolic etiology 4.  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  Kidney stones 6.  Gout 7.  Family history of pancreas cancer 8.  History of basal cell carcinoma 9.  Negative genetic testing with VUS in EGFR.  First-degree relatives are candidates for considering pancreatic cancer screening based on family history.  Discussed with patient and wife 01/30/2024.    Disposition: Timothy Wilcox appears unchanged.  He has completed 11  cycles of gemcitabine /Abraxane .  Clinical status continues to be improved and CA 19-9 is stable over the past 2 months, improved significantly since beginning treatment.  Recent restaging CTs overall stable, no new disease.  We reviewed the results/images with Timothy Wilcox and his wife.  Plan to continue gemcitabine /Abraxane  every 2 weeks with repeat noncontrast chest CT at a 6-week interval.  He agrees with this plan. Plan to proceed with cycle 12 Gemcitabine /abraxane  today as scheduled.   CBC and chemistry panel reviewed.  Labs adequate for treatment.  He will return for follow-up and Gemcitabine /Abraxane  in 2 weeks.  We are available to see him sooner if needed.  Patient seen with Dr. Cloretta.    [name] ANP/GNP-BC   06/25/2024  11:15 AM  This was a shared visit with [name].  Timothy Wilcox was interviewed and examined.  We reviewed the restaging CT findings and images with Timothy Wilcox and his wife.  The pancreas mass appears stable.  The retroperitoneal lymph node is smaller.  Several lung nodules are larger and others are stable.  The CA 19-9 is lower compared to when he began chemotherapy and his overall clinical status has improved.  I cannot appreciate a significant change in the lung metastases.  I recommend continuing gemcitabine /Abraxane .  We will follow his clinical status, the CA 19-9, and plan for a restaging CT chest in 6 weeks.  I was present for greater than 50% of today's visit.  I performed medical decision making.  Arvella Cloretta, MD

## 2024-06-25 NOTE — Patient Instructions (Signed)

## 2024-06-25 NOTE — Progress Notes (Signed)
 Patient seen by [name] NP today  Vitals are within treatment parameters:Yes   Labs are within treatment parameters: Yes   Treatment plan has been signed: Yes   Per physician team, Patient is ready for treatment and there are NO modifications to the treatment plan.

## 2024-06-26 LAB — CANCER ANTIGEN 19-9: CA 19-9: 524 U/mL — ABNORMAL HIGH (ref 0–35)

## 2024-06-27 ENCOUNTER — Other Ambulatory Visit: Payer: Self-pay

## 2024-06-28 ENCOUNTER — Encounter: Payer: Self-pay | Admitting: Nurse Practitioner

## 2024-07-01 ENCOUNTER — Other Ambulatory Visit (HOSPITAL_BASED_OUTPATIENT_CLINIC_OR_DEPARTMENT_OTHER): Payer: Self-pay

## 2024-07-01 ENCOUNTER — Encounter: Payer: Self-pay | Admitting: Oncology

## 2024-07-01 ENCOUNTER — Other Ambulatory Visit: Payer: Self-pay

## 2024-07-02 ENCOUNTER — Telehealth: Payer: Self-pay

## 2024-07-02 ENCOUNTER — Other Ambulatory Visit: Payer: Self-pay | Admitting: Nurse Practitioner

## 2024-07-02 ENCOUNTER — Ambulatory Visit (HOSPITAL_BASED_OUTPATIENT_CLINIC_OR_DEPARTMENT_OTHER)
Admission: RE | Admit: 2024-07-02 | Discharge: 2024-07-02 | Disposition: A | Discharge status: Home / Self Care | Source: Ambulatory Visit | Attending: Nurse Practitioner | Admitting: Nurse Practitioner

## 2024-07-02 ENCOUNTER — Other Ambulatory Visit (HOSPITAL_BASED_OUTPATIENT_CLINIC_OR_DEPARTMENT_OTHER): Payer: Self-pay

## 2024-07-02 DIAGNOSIS — C250 Malignant neoplasm of head of pancreas: Secondary | ICD-10-CM | POA: Insufficient documentation

## 2024-07-02 DIAGNOSIS — R0600 Dyspnea, unspecified: Secondary | ICD-10-CM | POA: Diagnosis not present

## 2024-07-02 DIAGNOSIS — R918 Other nonspecific abnormal finding of lung field: Secondary | ICD-10-CM | POA: Diagnosis not present

## 2024-07-02 DIAGNOSIS — C259 Malignant neoplasm of pancreas, unspecified: Secondary | ICD-10-CM | POA: Diagnosis not present

## 2024-07-02 DIAGNOSIS — R059 Cough, unspecified: Secondary | ICD-10-CM | POA: Diagnosis not present

## 2024-07-02 MED ORDER — COMIRNATY 30 MCG/0.3ML IM SUSY
0.3000 mL | PREFILLED_SYRINGE | Freq: Once | INTRAMUSCULAR | 0 refills | Status: AC
  Filled 2024-07-02: qty 0.3, 1d supply, fill #0

## 2024-07-02 NOTE — Telephone Encounter (Signed)
-----   Message from [name] sent at 07/02/2024  1:11 PM EDT ----- Please let him know there were no acute findings on the chest x-ray.  Bilateral lung nodules similar to recent CT.  Please tell him to treat the sinus drainage symptomatically and to call if the cough persists, he develops worsening dyspnea or fever.

## 2024-07-02 NOTE — Telephone Encounter (Signed)
Patient gave verbal understanding and had no further questions or cocerns

## 2024-07-04 ENCOUNTER — Other Ambulatory Visit (HOSPITAL_BASED_OUTPATIENT_CLINIC_OR_DEPARTMENT_OTHER): Payer: Self-pay

## 2024-07-05 ENCOUNTER — Other Ambulatory Visit: Payer: Self-pay | Admitting: Oncology

## 2024-07-08 NOTE — Progress Notes (Unsigned)
 [facility]   [phone] [phone]    Patient Care Team: Marvene Prentice SAUNDERS, FNP as PCP - General (Family Medicine) Wonda Sharper, MD as PCP - Cardiology (Cardiology) Nori Sari SQUIBB, RN as Oncology Nurse Navigator   CHIEF COMPLAINT: Follow up pancreas cancer   CURRENT THERAPY: Gemcitabine /Abraxane   INTERVAL HISTORY Timothy Wilcox returns for follow up as scheduled. Last seen 9/18 with another cycle of Gem/Abraxane .   ROS   Past Medical History:  Diagnosis Date   Arthritis    CAD (coronary artery disease) 02/26/2018   A. Inf STEMI 6/18: LHC - pLCx 60, dRCA 100, EF 45-50 >> PCI:  DES to RCA // B. Echo 8/18: EF 60-65, nwm, grade 1 dd, MAC. C. 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   Hyperlipemia    intol to some statins   IBS (irritable bowel syndrome)    Medication intolerance    severe SOB due to Brilinta  >> changed to Plavix    Renal disorder    kidney stones   Seasonal allergies    Sinus congestion    chronic   Sleep apnea    uses a c-pap     Past Surgical History:  Procedure Laterality Date   COLONOSCOPY     CORONARY STENT INTERVENTION N/A 04/03/2017   Procedure: Coronary Stent Intervention;  Surgeon: Wonda Sharper, MD;  Location: [facility] INVASIVE CV LAB;  Service: Cardiovascular;  Laterality: N/A;   CORONARY STENT INTERVENTION N/A 02/27/2018   Procedure: CORONARY STENT INTERVENTION;  Surgeon: Burnard Debby LABOR, MD;  Location: [facility] INVASIVE CV LAB;  Service: Cardiovascular;  Laterality: N/A;   INCISION AND DRAINAGE  2011   infected finger   IR IMAGING GUIDED PORT INSERTION  01/15/2024   LEFT HEART CATH AND CORONARY ANGIOGRAPHY N/A 04/03/2017   Procedure: Left Heart Cath and Coronary Angiography;  Surgeon: Wonda Sharper, MD;  Location: [facility] INVASIVE CV LAB;  Service: Cardiovascular;  Laterality: N/A;   LEFT HEART CATH AND CORONARY ANGIOGRAPHY N/A 02/27/2018   Procedure: LEFT HEART CATH  AND CORONARY ANGIOGRAPHY;  Surgeon: Burnard Debby LABOR, MD;  Location: [facility] INVASIVE CV LAB;  Service: Cardiovascular;  Laterality: N/A;   SHOULDER ARTHROSCOPY WITH ROTATOR CUFF REPAIR AND SUBACROMIAL DECOMPRESSION Right 01/29/2013   Procedure: RIGHT SHOULDER ARTHROSCOPY WITH SUBACROMIAL DECOMPRESSION, THREE TENDON ROTATOR CUFF REPAIR;  Surgeon: Lamar LULLA Leonor Mickey., MD;  Location: [facility];  Service: Orthopedics;  Laterality: Right;   TOE DEBRIDEMENT     rt toe cyst     Outpatient Encounter Medications as of 07/09/2024  Medication Sig Note   allopurinol  (ZYLOPRIM ) 300 MG tablet Take 1 tablet (300 mg total) by mouth daily.    amLODipine  (NORVASC ) 5 MG tablet Take 1 tablet (5 mg total) by mouth daily.    apixaban  (ELIQUIS ) 5 MG TABS tablet Take 1 tablet (5 mg total) by mouth 2 (two) times daily.    aspirin  EC 81 MG tablet Take 81 mg by mouth daily.    docusate sodium  (COLACE) 100 MG capsule Take 100 mg by mouth daily.    fluticasone  (FLONASE ) 50 MCG/ACT nasal spray Place 2 sprays into both nostrils See admin instructions. Instill 2 sprays into both nostrils in the morning and evening    HYDROcodone -acetaminophen  (NORCO/VICODIN) 5-325 MG tablet Take 1 tablet by mouth every 6 (six) hours as needed for moderate pain (pain score 4-6). (Patient not taking: Reported  on 06/25/2024)    hydrocortisone 1 % ointment Apply 1 Application topically 2 (two) times daily as needed for itching. (Patient not taking: Reported on 06/25/2024)    ibuprofen  (ADVIL ,MOTRIN ) 200 MG tablet Take 400 mg by mouth every 6 (six) hours as needed for mild pain (pain score 1-3) (lower back pain). (Patient not taking: Reported on 06/25/2024)    ketoconazole (NIZORAL) 2 % shampoo Apply 1 Application topically See admin instructions. Use as a shampoo once a week    Lactobacillus Rhamnosus, GG, (CULTURELLE) CAPS Take 1 capsule by mouth in the morning.    loratadine  (CLARITIN ) 10 MG tablet Take 10 mg by mouth See admin  instructions. Take 10 mg by mouth in the morning and evening    Menthol-Methyl Salicylate (SALONPAS PAIN RELIEF PATCH) PTCH Apply 1 patch topically daily as needed (for pain). 12/09/2023: No patches are on at this time   Multiple Vitamin (MULTIVITAMIN WITH MINERALS) TABS Take 1 tablet by mouth daily with breakfast.    nitroGLYCERIN  (NITROSTAT ) 0.4 MG SL tablet Place 1 tablet (0.4 mg total) under the tongue every 5 (five) minutes as needed for chest pain. X 3 doses    predniSONE  (DELTASONE ) 20 MG tablet Take 1 tablet (20 mg total) by mouth daily with breakfast.    PRESCRIPTION MEDICATION CPAP- At bedtime    tacrolimus (PROTOPIC) 0.1 % ointment Apply 1 Application topically daily as needed (for rashes- affected areas).    Wheat Dextrin (BENEFIBER) POWD Take 4 g by mouth See admin instructions. Mix 4 grams (1 teaspoonful) into a desired beverage and drink in the morning    zolpidem  (AMBIEN ) 10 MG tablet Take 1 tablet (10 mg total) by mouth at bedtime as needed for insomnia.    No facility-administered encounter medications on file as of 07/09/2024.     There were no vitals filed for this visit. There is no height or weight on file to calculate BMI.   ECOG PERFORMANCE STATUS: {CHL ONC ECOG PS:[id number]}  PHYSICAL EXAM GENERAL:alert, no distress and comfortable SKIN: no rash  EYES: sclera clear NECK: without mass LYMPH:  no palpable cervical or supraclavicular lymphadenopathy  LUNGS: clear with normal breathing effort HEART: regular rate & rhythm, no lower extremity edema ABDOMEN: abdomen soft, non-tender and normal bowel sounds NEURO: alert & oriented x 3 with fluent speech, no focal motor/sensory deficits Breast exam:  PAC without erythema    CBC    Latest Ref Rng & Units 06/25/2024   10:45 AM 06/12/2024   11:07 AM 05/28/2024   10:43 AM  CBC  WBC 4.0 - 10.5 K/uL 7.0  8.5  5.6   Hemoglobin 13.0 - 17.0 g/dL 87.7  86.9  87.5   Hematocrit 39.0 - 52.0 % 36.7  38.8  36.4   Platelets 150 -  400 K/uL 171  249  272       CMP     Latest Ref Rng & Units 06/25/2024   10:45 AM 06/12/2024   11:07 AM 05/28/2024   10:43 AM  CMP  Glucose 70 - 99 mg/dL 801  857  813   BUN 8 - 23 mg/dL 15  19  15    Creatinine 0.61 - 1.24 mg/dL 9.08  8.91  8.83   Sodium 135 - 145 mmol/L 138  139  139   Potassium 3.5 - 5.1 mmol/L 3.6  3.8  4.1   Chloride 98 - 111 mmol/L 102  103  104   CO2 22 - 32 mmol/L 21  22  21   Calcium  8.9 - 10.3 mg/dL 9.2  9.5  9.4   Total Protein 6.5 - 8.1 g/dL 5.7  5.8  5.8   Total Bilirubin 0.0 - 1.2 mg/dL 0.3  0.4  0.4   Alkaline Phos 38 - 126 U/L 59  58  62   AST 15 - 41 U/L 35  21  20   ALT 0 - 44 U/L 35  26  24       ASSESSMENT & PLAN:  Pancreas cancer 11/19/2022: 2.9 cm thin-walled cystic lesion in the pancreas uncinate 07/28/2023: CT abdomen/pelvis: Stable 2.8 cm cystic area in the uncinate process 11/29/2023: CT chest-subsegmental and segmental pulmonary emboli in the left upper lobe, dilation of the right ventricle, multiple pulmonary nodules 12/26/2023: CA 19-9- 66,286 12/27/2023: PET-5 cm hypermetabolic pancreas head/uncinate mass, numerous hypermetabolic pulmonary nodules, hypermetabolic left supraclavicular and retroperitoneal lymph nodes 01/06/2024: Left supraclavicular lymph node biopsy-metastatic moderately differentiated adenocarcinoma, 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, upper GI, breast, and salivary gland carcinoma Foundation 1 01/06/2024-HRDsig negative, microsatellite stable, tumor mutation burden 2, K-ras G12R Cycle 1 gemcitabine /Abraxane  01/16/2024 Cycle 2 gemcitabine /Abraxane  01/30/2024 Cycle 3 gemcitabine /Abraxane  02/13/2024 Cycle 4 gemcitabine /Abraxane  02/27/2024 Cycle 5 gemcitabine /Abraxane  03/12/2024 03/23/2024 CTs: Decrease size of pancreas head/uncinate mass, mixed response involving bilateral pulmonary nodules, decreased left supraclavicular retroperitoneal lymph nodes Cycle 6 gemcitabine /Abraxane  03/27/2024 Cycle 7  gemcitabine /Abraxane  04/09/2024 Cycle 8 gemcitabine /Abraxane  04/23/2024 Cycle 9 gemcitabine /Abraxane  05/07/2024 Cycle 10 gemcitabine /Abraxane  05/28/2024 Cycle 11 gemcitabine /Abraxane  06/12/2024 CTs 06/23/2024-increase in size of numerous bilateral irregular pulmonary nodules.  Pancreas head mass is similar in size.  Decreased size of left supraclavicular and retroperitoneal lymph nodes.  Per review by Dr. Cloretta, overall stable. Cycle 12 gemcitabine /Abraxane  06/25/2024, repeat noncontrast chest CT at a 6-week interval Pulmonary embolism 11/29/2023 Small segmental and subsegmental pulmonary emboli in the left upper lobe, dilation of right ventricle Admitted for heparin  anticoagulation followed by apixaban -discharged 12/01/2023 Lower extremity Dopplers 11/30/2023: Acute left posterior tibial, peroneal, and TP trunk DVTs, acute right peroneal DVT Lovenox  12/31/2023 Lovenox  discontinued, Eliquis  initiated 04/14/2024   3.  CVAs-visual disturbance and altered mental status 12/30/2023 MRI brain-many foci of restricted diffusion in the cerebellum, left frontal, left basal ganglia, and parietal lobes compatible with acute/subacute infarcts, consider embolic etiology 4.  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  Kidney stones 6.  Gout 7.  Family history of pancreas cancer 8.  History of basal cell carcinoma 9.  Negative genetic testing with VUS in EGFR.  First-degree relatives are candidates for considering pancreatic cancer screening based on family history.  Discussed with patient and wife 01/30/2024.    PLAN:  No orders of the defined types were placed in this encounter.     All questions were answered. The patient knows to call the clinic with any problems, questions or concerns. No barriers to learning were detected. I spent *** counseling the patient face to face. The total time spent in the appointment was *** and more than 50% was on counseling, review of test results, and coordination  of care.   Timothy Blackwell K Karrington Studnicka, NP 07/08/2024 4:37 PM

## 2024-07-09 ENCOUNTER — Encounter: Payer: Self-pay | Admitting: Nurse Practitioner

## 2024-07-09 ENCOUNTER — Inpatient Hospital Stay: Attending: Oncology

## 2024-07-09 ENCOUNTER — Inpatient Hospital Stay

## 2024-07-09 ENCOUNTER — Inpatient Hospital Stay (HOSPITAL_BASED_OUTPATIENT_CLINIC_OR_DEPARTMENT_OTHER): Admitting: Nurse Practitioner

## 2024-07-09 VITALS — BP 132/68 | HR 79 | Temp 98.2°F | Resp 20

## 2024-07-09 VITALS — BP 130/69 | HR 83 | Temp 98.0°F | Resp 20 | Ht 66.0 in | Wt 229.1 lb

## 2024-07-09 DIAGNOSIS — C250 Malignant neoplasm of head of pancreas: Secondary | ICD-10-CM

## 2024-07-09 DIAGNOSIS — Z5111 Encounter for antineoplastic chemotherapy: Secondary | ICD-10-CM | POA: Diagnosis present

## 2024-07-09 DIAGNOSIS — C770 Secondary and unspecified malignant neoplasm of lymph nodes of head, face and neck: Secondary | ICD-10-CM | POA: Diagnosis present

## 2024-07-09 DIAGNOSIS — Z79899 Other long term (current) drug therapy: Secondary | ICD-10-CM | POA: Insufficient documentation

## 2024-07-09 LAB — CBC WITH DIFFERENTIAL (CANCER CENTER ONLY)
Abs Immature Granulocytes: 0.46 K/uL — ABNORMAL HIGH (ref 0.00–0.07)
Basophils Absolute: 0.1 K/uL (ref 0.0–0.1)
Basophils Relative: 1 %
Eosinophils Absolute: 0.1 K/uL (ref 0.0–0.5)
Eosinophils Relative: 1 %
HCT: 37.1 % — ABNORMAL LOW (ref 39.0–52.0)
Hemoglobin: 12.5 g/dL — ABNORMAL LOW (ref 13.0–17.0)
Immature Granulocytes: 5 %
Lymphocytes Relative: 15 %
Lymphs Abs: 1.5 K/uL (ref 0.7–4.0)
MCH: 33.2 pg (ref 26.0–34.0)
MCHC: 33.7 g/dL (ref 30.0–36.0)
MCV: 98.7 fL (ref 80.0–100.0)
Monocytes Absolute: 1.1 K/uL — ABNORMAL HIGH (ref 0.1–1.0)
Monocytes Relative: 11 %
Neutro Abs: 7 K/uL (ref 1.7–7.7)
Neutrophils Relative %: 67 %
Platelet Count: 254 K/uL (ref 150–400)
RBC: 3.76 MIL/uL — ABNORMAL LOW (ref 4.22–5.81)
RDW: 15.4 % (ref 11.5–15.5)
WBC Count: 10.3 K/uL (ref 4.0–10.5)
nRBC: 0 % (ref 0.0–0.2)

## 2024-07-09 LAB — CMP (CANCER CENTER ONLY)
ALT: 30 U/L (ref 0–44)
AST: 35 U/L (ref 15–41)
Albumin: 3.5 g/dL (ref 3.5–5.0)
Alkaline Phosphatase: 60 U/L (ref 38–126)
Anion gap: 13 (ref 5–15)
BUN: 17 mg/dL (ref 8–23)
CO2: 22 mmol/L (ref 22–32)
Calcium: 9.9 mg/dL (ref 8.9–10.3)
Chloride: 104 mmol/L (ref 98–111)
Creatinine: 0.95 mg/dL (ref 0.61–1.24)
GFR, Estimated: >60 mL/min (ref >60)
Glucose, Bld: 139 mg/dL — ABNORMAL HIGH (ref 70–99)
Potassium: 3.6 mmol/L (ref 3.5–5.1)
Sodium: 139 mmol/L (ref 135–145)
Total Bilirubin: 0.3 mg/dL (ref 0.0–1.2)
Total Protein: 6.1 g/dL — ABNORMAL LOW (ref 6.5–8.1)

## 2024-07-09 MED ORDER — PACLITAXEL PROTEIN-BOUND CHEMO INJECTION 100 MG
100.0000 mg/m2 | Freq: Once | INTRAVENOUS | Status: AC
  Administered 2024-07-09: 225 mg via INTRAVENOUS
  Filled 2024-07-09: qty 45

## 2024-07-09 MED ORDER — SODIUM CHLORIDE 0.9 % IV SOLN
2200.0000 mg | Freq: Once | INTRAVENOUS | Status: AC
  Administered 2024-07-09: 2200 mg via INTRAVENOUS
  Filled 2024-07-09: qty 52.57

## 2024-07-09 MED ORDER — SODIUM CHLORIDE 0.9 % IV SOLN: INTRAVENOUS | Status: DC

## 2024-07-09 NOTE — Progress Notes (Signed)
 Patient seen by Lacie Burton, NP today  Vitals are within treatment parameters:Yes   Labs are within treatment parameters: Yes   Treatment plan has been signed: Yes   Per physician team, Patient is ready for treatment and there are NO modifications to the treatment plan.

## 2024-07-09 NOTE — Patient Instructions (Signed)
 [facility] - A DEPT OF [place]  Discharge Instructions: Thank you for choosing [facility] to provide your oncology and hematology care.   If you have a lab appointment with the Cancer Center, please go directly to the Cancer Center and check in at the registration area.   Wear comfortable clothing and clothing appropriate for easy access to any Portacath or PICC line.   We strive to give you quality time with your provider. You may need to reschedule your appointment if you arrive late (15 or more minutes).  Arriving late affects you and other patients whose appointments are after yours.  Also, if you miss three or more appointments without notifying the office, you may be dismissed from the clinic at the provider's discretion.      For prescription refill requests, have your pharmacy contact our office and allow 72 hours for refills to be completed.    Today you received the following chemotherapy and/or immunotherapy agents Abraxane  and Gemzar       To help prevent nausea and vomiting after your treatment, we encourage you to take your nausea medication as directed.  BELOW ARE SYMPTOMS THAT SHOULD BE REPORTED IMMEDIATELY: *FEVER GREATER THAN 100.4 F (38 C) OR HIGHER *CHILLS OR SWEATING *NAUSEA AND VOMITING THAT IS NOT CONTROLLED WITH YOUR NAUSEA MEDICATION *UNUSUAL SHORTNESS OF BREATH *UNUSUAL BRUISING OR BLEEDING *URINARY PROBLEMS (pain or burning when urinating, or frequent urination) *BOWEL PROBLEMS (unusual diarrhea, constipation, pain near the anus) TENDERNESS IN MOUTH AND THROAT WITH OR WITHOUT PRESENCE OF ULCERS (sore throat, sores in mouth, or a toothache) UNUSUAL RASH, SWELLING OR PAIN  UNUSUAL VAGINAL DISCHARGE OR ITCHING   Items with * indicate a potential emergency and should be followed up as soon as possible or go to the Emergency Department if any problems should occur.  Please show the CHEMOTHERAPY ALERT CARD or  IMMUNOTHERAPY ALERT CARD at check-in to the Emergency Department and triage nurse.  Should you have questions after your visit or need to cancel or reschedule your appointment, please contact [facility]  Dept: [phone]  and follow the prompts.  Office hours are 8:00 a.m. to 4:30 p.m. Monday - Friday. Please note that voicemails left after 4:00 p.m. may not be returned until the following business day.  We are closed weekends and major holidays. You have access to a nurse at all times for urgent questions. Please call the main number to the clinic Dept: [phone] and follow the prompts.   For any non-urgent questions, you may also contact your provider using MyChart. We now offer e-Visits for anyone 31 and older to request care online for non-urgent symptoms. For details visit [email or web address].   Also download the MyChart app! Go to the app store, search "MyChart", open the app, select , and log in with your MyChart username and password.

## 2024-07-10 LAB — CANCER ANTIGEN 19-9: CA 19-9: 691 U/mL — ABNORMAL HIGH (ref 0–35)

## 2024-07-12 ENCOUNTER — Other Ambulatory Visit: Payer: Self-pay

## 2024-07-15 ENCOUNTER — Other Ambulatory Visit (HOSPITAL_BASED_OUTPATIENT_CLINIC_OR_DEPARTMENT_OTHER): Payer: Self-pay

## 2024-07-15 MED ORDER — FLUZONE HIGH-DOSE 0.5 ML IM SUSY
0.5000 mL | PREFILLED_SYRINGE | Freq: Once | INTRAMUSCULAR | 0 refills | Status: AC
Start: 2024-07-15 — End: 2024-07-16
  Filled 2024-07-15: qty 0.5, 1d supply, fill #0

## 2024-07-19 ENCOUNTER — Other Ambulatory Visit: Payer: Self-pay | Admitting: Oncology

## 2024-07-22 ENCOUNTER — Other Ambulatory Visit: Payer: Self-pay | Admitting: Nurse Practitioner

## 2024-07-22 DIAGNOSIS — C250 Malignant neoplasm of head of pancreas: Secondary | ICD-10-CM

## 2024-07-23 ENCOUNTER — Inpatient Hospital Stay: Admitting: Nurse Practitioner

## 2024-07-23 ENCOUNTER — Inpatient Hospital Stay

## 2024-07-23 ENCOUNTER — Encounter: Payer: Self-pay | Admitting: Nurse Practitioner

## 2024-07-23 ENCOUNTER — Other Ambulatory Visit (HOSPITAL_BASED_OUTPATIENT_CLINIC_OR_DEPARTMENT_OTHER): Payer: Self-pay

## 2024-07-23 ENCOUNTER — Other Ambulatory Visit: Payer: Self-pay

## 2024-07-23 VITALS — BP 126/81 | HR 72 | Temp 98.1°F | Resp 18

## 2024-07-23 VITALS — BP 118/62 | HR 80 | Temp 97.8°F | Resp 18 | Ht 66.0 in | Wt 233.2 lb

## 2024-07-23 DIAGNOSIS — C250 Malignant neoplasm of head of pancreas: Secondary | ICD-10-CM

## 2024-07-23 DIAGNOSIS — Z5111 Encounter for antineoplastic chemotherapy: Secondary | ICD-10-CM | POA: Diagnosis not present

## 2024-07-23 LAB — CMP (CANCER CENTER ONLY)
ALT: 34 U/L (ref 0–44)
AST: 27 U/L (ref 15–41)
Albumin: 3.6 g/dL (ref 3.5–5.0)
Alkaline Phosphatase: 59 U/L (ref 38–126)
Anion gap: 13 (ref 5–15)
BUN: 17 mg/dL (ref 8–23)
CO2: 22 mmol/L (ref 22–32)
Calcium: 9.5 mg/dL (ref 8.9–10.3)
Chloride: 105 mmol/L (ref 98–111)
Creatinine: 1.08 mg/dL (ref 0.61–1.24)
GFR, Estimated: >60 mL/min (ref >60)
Glucose, Bld: 137 mg/dL — ABNORMAL HIGH (ref 70–99)
Potassium: 3.7 mmol/L (ref 3.5–5.1)
Sodium: 140 mmol/L (ref 135–145)
Total Bilirubin: 0.3 mg/dL (ref 0.0–1.2)
Total Protein: 5.7 g/dL — ABNORMAL LOW (ref 6.5–8.1)

## 2024-07-23 LAB — CBC WITH DIFFERENTIAL (CANCER CENTER ONLY)
Abs Immature Granulocytes: 0.15 K/uL — ABNORMAL HIGH (ref 0.00–0.07)
Basophils Absolute: 0.1 K/uL (ref 0.0–0.1)
Basophils Relative: 1 %
Eosinophils Absolute: 0.3 K/uL (ref 0.0–0.5)
Eosinophils Relative: 4 %
HCT: 36.8 % — ABNORMAL LOW (ref 39.0–52.0)
Hemoglobin: 12.3 g/dL — ABNORMAL LOW (ref 13.0–17.0)
Immature Granulocytes: 2 %
Lymphocytes Relative: 22 %
Lymphs Abs: 1.7 K/uL (ref 0.7–4.0)
MCH: 32.9 pg (ref 26.0–34.0)
MCHC: 33.4 g/dL (ref 30.0–36.0)
MCV: 98.4 fL (ref 80.0–100.0)
Monocytes Absolute: 0.8 K/uL (ref 0.1–1.0)
Monocytes Relative: 10 %
Neutro Abs: 4.9 K/uL (ref 1.7–7.7)
Neutrophils Relative %: 61 %
Platelet Count: 184 K/uL (ref 150–400)
RBC: 3.74 MIL/uL — ABNORMAL LOW (ref 4.22–5.81)
RDW: 16.4 % — ABNORMAL HIGH (ref 11.5–15.5)
WBC Count: 8 K/uL (ref 4.0–10.5)
nRBC: 0 % (ref 0.0–0.2)

## 2024-07-23 MED ORDER — SODIUM CHLORIDE 0.9 % IV SOLN
2200.0000 mg | Freq: Once | INTRAVENOUS | Status: AC
  Administered 2024-07-23: 2200 mg via INTRAVENOUS
  Filled 2024-07-23: qty 52.57

## 2024-07-23 MED ORDER — PREDNISONE 20 MG PO TABS
20.0000 mg | ORAL_TABLET | Freq: Every day | ORAL | 0 refills | Status: DC
  Filled 2024-07-23: qty 90, 90d supply, fill #0

## 2024-07-23 MED ORDER — PACLITAXEL PROTEIN-BOUND CHEMO INJECTION 100 MG
100.0000 mg/m2 | Freq: Once | INTRAVENOUS | Status: AC
  Administered 2024-07-23: 225 mg via INTRAVENOUS
  Filled 2024-07-23: qty 45

## 2024-07-23 MED ORDER — SODIUM CHLORIDE 0.9 % IV SOLN: INTRAVENOUS | Status: DC

## 2024-07-23 MED ORDER — PREDNISONE 20 MG PO TABS
20.0000 mg | ORAL_TABLET | Freq: Every day | ORAL | 2 refills | Status: DC
  Filled 2024-07-23: qty 30, 30d supply, fill #0

## 2024-07-23 NOTE — Progress Notes (Signed)
 Patient seen by [name] NP today  Vitals are within treatment parameters:Yes   Labs are within treatment parameters: Yes   Treatment plan has been signed: Yes   Per physician team, Patient is ready for treatment and there are NO modifications to the treatment plan.

## 2024-07-23 NOTE — Patient Instructions (Signed)

## 2024-07-23 NOTE — Progress Notes (Signed)
 Timothy Wilcox OFFICE PROGRESS NOTE   Diagnosis: Pancreas cancer  INTERVAL HISTORY:   Timothy Wilcox returns as scheduled.  He completed another cycle of gemcitabine /Abraxane  07/09/2024.  He denies nausea/vomiting.  No mouth sores.  Mild diarrhea a few days after treatment.  No rash or fever after treatment.  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  He notes energy level is decreased for 4 to 5 days after each treatment.  Overall he has noted considerable improvement in the cough.  Dyspnea is stable.  Objective:  Vital signs in last 24 hours:  Blood pressure 118/62, pulse 80, temperature 97.8 F (36.6 C), temperature source Temporal, resp. rate 18, height 5' 6 (1.676 m), weight 233 lb 3.2 oz (105.8 kg), SpO2 98%.    HEENT: No thrush or ulcers. Resp: Rhonchi right lung base.  Lungs otherwise clear.   Cardio: Regular rate and rhythm. GI: No hepatosplenomegaly.  Nontender. Vascular: Trace lower leg edema bilaterally. Neuro: Alert and oriented. Skin: No rash. Port-A-Cath without erythema.  Lab Results:  Lab Results  Component Value Date   WBC 8.0 07/23/2024   HGB 12.3 (L) 07/23/2024   HCT 36.8 (L) 07/23/2024   MCV 98.4 07/23/2024   PLT 184 07/23/2024   NEUTROABS 4.9 07/23/2024    Imaging:  No results found.  Medications: I have reviewed the patient's current medications.  Assessment/Plan: Pancreas cancer 11/19/2022: 2.9 cm thin-walled cystic lesion in the pancreas uncinate 07/28/2023: CT abdomen/pelvis: Stable 2.8 cm cystic area in the uncinate process 11/29/2023: CT chest-subsegmental and segmental pulmonary emboli in the left upper lobe, dilation of the right ventricle, multiple pulmonary nodules 12/26/2023: CA 19-9- 66,286 12/27/2023: PET-5 cm hypermetabolic pancreas head/uncinate mass, numerous hypermetabolic pulmonary nodules, hypermetabolic left supraclavicular and retroperitoneal lymph nodes 01/06/2024: Left supraclavicular lymph node biopsy-metastatic  moderately differentiated adenocarcinoma, 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, upper GI, breast, and salivary gland carcinoma Foundation 1 01/06/2024-HRDsig negative, microsatellite stable, tumor mutation burden 2, K-ras G12R Cycle 1 gemcitabine /Abraxane  01/16/2024 Cycle 2 gemcitabine /Abraxane  01/30/2024 Cycle 3 gemcitabine /Abraxane  02/13/2024 Cycle 4 gemcitabine /Abraxane  02/27/2024 Cycle 5 gemcitabine /Abraxane  03/12/2024 03/23/2024 CTs: Decrease size of pancreas head/uncinate mass, mixed response involving bilateral pulmonary nodules, decreased left supraclavicular retroperitoneal lymph nodes Cycle 6 gemcitabine /Abraxane  03/27/2024 Cycle 7 gemcitabine /Abraxane  04/09/2024 Cycle 8 gemcitabine /Abraxane  04/23/2024 Cycle 9 gemcitabine /Abraxane  05/07/2024 Cycle 10 gemcitabine /Abraxane  05/28/2024 Cycle 11 gemcitabine /Abraxane  06/12/2024 CTs 06/23/2024-increase in size of numerous bilateral irregular pulmonary nodules.  Pancreas head mass is similar in size.  Decreased size of left supraclavicular and retroperitoneal lymph nodes.  Per review by Dr. Cloretta, overall stable. Cycle 12 gemcitabine /Abraxane  06/25/2024, repeat noncontrast chest CT at a 6-week interval Cycle 13 gemcitabine /Abraxane  07/09/2024 Cycle 14 gemcitabine /Abraxane  07/23/2024 Pulmonary embolism 11/29/2023 Small segmental and subsegmental pulmonary emboli in the left upper lobe, dilation of right ventricle Admitted for heparin  anticoagulation followed by apixaban -discharged 12/01/2023 Lower extremity Dopplers 11/30/2023: Acute left posterior tibial, peroneal, and TP trunk DVTs, acute right peroneal DVT Lovenox  12/31/2023 Lovenox  discontinued, Eliquis  initiated 04/14/2024   3.  CVAs-visual disturbance and altered mental status 12/30/2023 MRI brain-many foci of restricted diffusion in the cerebellum, left frontal, left basal ganglia, and parietal lobes compatible with acute/subacute infarcts, consider embolic etiology 4.  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  Kidney stones 6.  Gout 7.  Family history of pancreas cancer 8.  History of basal cell carcinoma 9.  Negative genetic testing with VUS in EGFR.  First-degree relatives are candidates for considering pancreatic cancer screening based on family history.  Discussed with  patient and wife 01/30/2024.      Disposition: Timothy Wilcox appears stable.  He continues gemcitabine /Abraxane  on a 2-week schedule.  Aside from fatigue he is tolerating treatment well.  Plan to proceed with another cycle today as scheduled.  Restaging noncontrast chest CT prior to next visit.  CBC and chemistry panel reviewed.  Labs adequate for treatment.  He will return for follow-up as scheduled in 2 weeks.  We are available to see him sooner if needed.    Timothy Wilcox ANP/GNP-BC   07/23/2024  10:44 AM

## 2024-07-23 NOTE — Patient Instructions (Signed)
 [facility] - A DEPT OF [place]  Discharge Instructions: Thank you for choosing [facility] to provide your oncology and hematology care.   If you have a lab appointment with the Cancer Center, please go directly to the Cancer Center and check in at the registration area.   Wear comfortable clothing and clothing appropriate for easy access to any Portacath or PICC line.   We strive to give you quality time with your provider. You may need to reschedule your appointment if you arrive late (15 or more minutes).  Arriving late affects you and other patients whose appointments are after yours.  Also, if you miss three or more appointments without notifying the office, you may be dismissed from the clinic at the provider's discretion.      For prescription refill requests, have your pharmacy contact our office and allow 72 hours for refills to be completed.    Today you received the following chemotherapy and/or immunotherapy agents Abraxane  and Gemzar       To help prevent nausea and vomiting after your treatment, we encourage you to take your nausea medication as directed.  BELOW ARE SYMPTOMS THAT SHOULD BE REPORTED IMMEDIATELY: *FEVER GREATER THAN 100.4 F (38 C) OR HIGHER *CHILLS OR SWEATING *NAUSEA AND VOMITING THAT IS NOT CONTROLLED WITH YOUR NAUSEA MEDICATION *UNUSUAL SHORTNESS OF BREATH *UNUSUAL BRUISING OR BLEEDING *URINARY PROBLEMS (pain or burning when urinating, or frequent urination) *BOWEL PROBLEMS (unusual diarrhea, constipation, pain near the anus) TENDERNESS IN MOUTH AND THROAT WITH OR WITHOUT PRESENCE OF ULCERS (sore throat, sores in mouth, or a toothache) UNUSUAL RASH, SWELLING OR PAIN  UNUSUAL VAGINAL DISCHARGE OR ITCHING   Items with * indicate a potential emergency and should be followed up as soon as possible or go to the Emergency Department if any problems should occur.  Please show the CHEMOTHERAPY ALERT CARD or  IMMUNOTHERAPY ALERT CARD at check-in to the Emergency Department and triage nurse.  Should you have questions after your visit or need to cancel or reschedule your appointment, please contact [facility]  Dept: [phone]  and follow the prompts.  Office hours are 8:00 a.m. to 4:30 p.m. Monday - Friday. Please note that voicemails left after 4:00 p.m. may not be returned until the following business day.  We are closed weekends and major holidays. You have access to a nurse at all times for urgent questions. Please call the main number to the clinic Dept: [phone] and follow the prompts.   For any non-urgent questions, you may also contact your provider using MyChart. We now offer e-Visits for anyone 31 and older to request care online for non-urgent symptoms. For details visit [email or web address].   Also download the MyChart app! Go to the app store, search "MyChart", open the app, select , and log in with your MyChart username and password.

## 2024-07-24 LAB — CANCER ANTIGEN 19-9: CA 19-9: 1176 U/mL — ABNORMAL HIGH (ref 0–35)

## 2024-07-26 ENCOUNTER — Other Ambulatory Visit: Payer: Self-pay

## 2024-07-29 ENCOUNTER — Other Ambulatory Visit (HOSPITAL_BASED_OUTPATIENT_CLINIC_OR_DEPARTMENT_OTHER): Payer: Self-pay

## 2024-07-29 ENCOUNTER — Other Ambulatory Visit: Payer: Self-pay | Admitting: Oncology

## 2024-07-29 ENCOUNTER — Ambulatory Visit (HOSPITAL_BASED_OUTPATIENT_CLINIC_OR_DEPARTMENT_OTHER)
Admission: RE | Admit: 2024-07-29 | Discharge: 2024-07-29 | Disposition: A | Discharge status: Home / Self Care | Source: Ambulatory Visit | Attending: Nurse Practitioner | Admitting: Nurse Practitioner

## 2024-07-29 DIAGNOSIS — C250 Malignant neoplasm of head of pancreas: Secondary | ICD-10-CM | POA: Diagnosis not present

## 2024-07-29 DIAGNOSIS — C7800 Secondary malignant neoplasm of unspecified lung: Secondary | ICD-10-CM | POA: Diagnosis not present

## 2024-07-29 DIAGNOSIS — I700 Atherosclerosis of aorta: Secondary | ICD-10-CM | POA: Diagnosis not present

## 2024-07-30 ENCOUNTER — Other Ambulatory Visit (HOSPITAL_BASED_OUTPATIENT_CLINIC_OR_DEPARTMENT_OTHER): Payer: Self-pay

## 2024-07-30 ENCOUNTER — Encounter: Payer: Self-pay | Admitting: Oncology

## 2024-07-30 MED ORDER — ZOLPIDEM TARTRATE 10 MG PO TABS
10.0000 mg | ORAL_TABLET | Freq: Every evening | ORAL | 1 refills | Status: DC | PRN
  Filled 2024-07-30: qty 30, 30d supply, fill #0
  Filled 2024-08-26 – 2024-08-28 (×2): qty 30, 30d supply, fill #1

## 2024-07-31 ENCOUNTER — Other Ambulatory Visit: Payer: Self-pay | Admitting: Oncology

## 2024-08-03 ENCOUNTER — Other Ambulatory Visit (HOSPITAL_BASED_OUTPATIENT_CLINIC_OR_DEPARTMENT_OTHER): Payer: Self-pay

## 2024-08-07 ENCOUNTER — Other Ambulatory Visit (HOSPITAL_BASED_OUTPATIENT_CLINIC_OR_DEPARTMENT_OTHER): Payer: Self-pay

## 2024-08-07 ENCOUNTER — Encounter: Payer: Self-pay | Admitting: Oncology

## 2024-08-07 ENCOUNTER — Inpatient Hospital Stay (HOSPITAL_BASED_OUTPATIENT_CLINIC_OR_DEPARTMENT_OTHER): Admitting: Oncology

## 2024-08-07 ENCOUNTER — Inpatient Hospital Stay

## 2024-08-07 VITALS — BP 119/63 | HR 80 | Temp 97.7°F | Resp 18 | Ht 66.0 in | Wt 235.1 lb

## 2024-08-07 DIAGNOSIS — C250 Malignant neoplasm of head of pancreas: Secondary | ICD-10-CM

## 2024-08-07 DIAGNOSIS — Z5111 Encounter for antineoplastic chemotherapy: Secondary | ICD-10-CM | POA: Diagnosis not present

## 2024-08-07 LAB — CMP (CANCER CENTER ONLY)
ALT: 28 U/L (ref 0–44)
AST: 20 U/L (ref 15–41)
Albumin: 3.6 g/dL (ref 3.5–5.0)
Alkaline Phosphatase: 57 U/L (ref 38–126)
Anion gap: 11 (ref 5–15)
BUN: 17 mg/dL (ref 8–23)
CO2: 23 mmol/L (ref 22–32)
Calcium: 9.4 mg/dL (ref 8.9–10.3)
Chloride: 106 mmol/L (ref 98–111)
Creatinine: 0.96 mg/dL (ref 0.61–1.24)
GFR, Estimated: >60 mL/min (ref >60)
Glucose, Bld: 174 mg/dL — ABNORMAL HIGH (ref 70–99)
Potassium: 3.5 mmol/L (ref 3.5–5.1)
Sodium: 140 mmol/L (ref 135–145)
Total Bilirubin: 0.3 mg/dL (ref 0.0–1.2)
Total Protein: 5.8 g/dL — ABNORMAL LOW (ref 6.5–8.1)

## 2024-08-07 LAB — CBC WITH DIFFERENTIAL (CANCER CENTER ONLY)
Abs Immature Granulocytes: 0.15 K/uL — ABNORMAL HIGH (ref 0.00–0.07)
Basophils Absolute: 0.1 K/uL (ref 0.0–0.1)
Basophils Relative: 1 %
Eosinophils Absolute: 0.2 K/uL (ref 0.0–0.5)
Eosinophils Relative: 3 %
HCT: 35.7 % — ABNORMAL LOW (ref 39.0–52.0)
Hemoglobin: 11.9 g/dL — ABNORMAL LOW (ref 13.0–17.0)
Immature Granulocytes: 2 %
Lymphocytes Relative: 16 %
Lymphs Abs: 1.5 K/uL (ref 0.7–4.0)
MCH: 33.1 pg (ref 26.0–34.0)
MCHC: 33.3 g/dL (ref 30.0–36.0)
MCV: 99.4 fL (ref 80.0–100.0)
Monocytes Absolute: 1.1 K/uL — ABNORMAL HIGH (ref 0.1–1.0)
Monocytes Relative: 12 %
Neutro Abs: 6.3 K/uL (ref 1.7–7.7)
Neutrophils Relative %: 66 %
Platelet Count: 233 K/uL (ref 150–400)
RBC: 3.59 MIL/uL — ABNORMAL LOW (ref 4.22–5.81)
RDW: 16.5 % — ABNORMAL HIGH (ref 11.5–15.5)
WBC Count: 9.3 K/uL (ref 4.0–10.5)
nRBC: 0 % (ref 0.0–0.2)

## 2024-08-07 NOTE — Progress Notes (Signed)
 [facility] OFFICE PROGRESS NOTE   Diagnosis: Pancreas cancer  INTERVAL HISTORY:   [name] completed another cycle of gemcitabine /Abraxane  on 07/23/2024.  No nausea, rash, or neuropathy symptoms.  He reports malaise for several days following chemotherapy.  He has a good appetite.  No pain.  He has a cough.  Objective:  Vital signs in last 24 hours:  Blood pressure 119/63, pulse 80, temperature 97.7 F (36.5 C), temperature source Temporal, resp. rate 18, height 5' 6 (1.676 m), weight 235 lb 1.6 oz (106.6 kg), SpO2 98%.    HEENT: No thrush Resp: Lungs with scattered inspiratory/expiratory rhonchi, no respiratory distress Cardio: Regular rate and rhythm GI: Nontender, no mass, no hepatosplenomegaly Vascular: Trace pitting edema at the lower leg bilaterally  Portacath/PICC-without erythema  Lab Results:  Lab Results  Component Value Date   WBC 9.3 08/07/2024   HGB 11.9 (L) 08/07/2024   HCT 35.7 (L) 08/07/2024   MCV 99.4 08/07/2024   PLT 233 08/07/2024   NEUTROABS 6.3 08/07/2024    CMP  Lab Results  Component Value Date   NA 140 08/07/2024   K 3.5 08/07/2024   CL 106 08/07/2024   CO2 23 08/07/2024   GLUCOSE 174 (H) 08/07/2024   BUN 17 08/07/2024   CREATININE 0.96 08/07/2024   CALCIUM  9.4 08/07/2024   PROT 5.8 (L) 08/07/2024   ALBUMIN  3.6 08/07/2024   AST 20 08/07/2024   ALT 28 08/07/2024   ALKPHOS 57 08/07/2024   BILITOT 0.3 08/07/2024   GFRNONAA >60 08/07/2024   GFRAA >60 11/08/2018    Lab Results  Component Value Date   CAN199 1,176 (H) 07/23/2024    Lab Results  Component Value Date   INR 1.1 11/30/2023   LABPROT 14.5 11/30/2023    Imaging:  No results found.  Medications: I have reviewed the patient's current medications.   Assessment/Plan: Pancreas cancer 11/19/2022: 2.9 cm thin-walled cystic lesion in the pancreas uncinate 07/28/2023: CT abdomen/pelvis: Stable 2.8 cm cystic area in the uncinate process 11/29/2023:  CT chest-subsegmental and segmental pulmonary emboli in the left upper lobe, dilation of the right ventricle, multiple pulmonary nodules 12/26/2023: CA 19-9- 66,286 12/27/2023: PET-5 cm hypermetabolic pancreas head/uncinate mass, numerous hypermetabolic pulmonary nodules, hypermetabolic left supraclavicular and retroperitoneal lymph nodes 01/06/2024: Left supraclavicular lymph node biopsy-metastatic moderately differentiated adenocarcinoma, 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, upper GI, breast, and salivary gland carcinoma Foundation 1 01/06/2024-HRDsig negative, microsatellite stable, tumor mutation burden 2, K-ras G12R Cycle 1 gemcitabine /Abraxane  01/16/2024 Cycle 2 gemcitabine /Abraxane  01/30/2024 Cycle 3 gemcitabine /Abraxane  02/13/2024 Cycle 4 gemcitabine /Abraxane  02/27/2024 Cycle 5 gemcitabine /Abraxane  03/12/2024 03/23/2024 CTs: Decrease size of pancreas head/uncinate mass, mixed response involving bilateral pulmonary nodules, decreased left supraclavicular retroperitoneal lymph nodes Cycle 6 gemcitabine /Abraxane  03/27/2024 Cycle 7 gemcitabine /Abraxane  04/09/2024 Cycle 8 gemcitabine /Abraxane  04/23/2024 Cycle 9 gemcitabine /Abraxane  05/07/2024 Cycle 10 gemcitabine /Abraxane  05/28/2024 Cycle 11 gemcitabine /Abraxane  06/12/2024 CTs 06/23/2024-increase in size of numerous bilateral irregular pulmonary nodules.  Pancreas head mass is similar in size.  Decreased size of left supraclavicular and retroperitoneal lymph nodes.  Per review by Dr. Cloretta, overall stable. Cycle 12 gemcitabine /Abraxane  06/25/2024, repeat noncontrast chest CT at a 6-week interval Cycle 13 gemcitabine /Abraxane  07/09/2024 Cycle 14 gemcitabine /Abraxane  07/23/2024 07/29/2024 CT chest: Progression of pulmonary metastases Pulmonary embolism 11/29/2023 Small segmental and subsegmental pulmonary emboli in the left upper lobe, dilation of right ventricle Admitted for heparin  anticoagulation followed by apixaban -discharged  12/01/2023 Lower extremity Dopplers 11/30/2023: Acute left posterior tibial, peroneal, and TP trunk DVTs, acute right peroneal DVT Lovenox  12/31/2023 Lovenox  discontinued, Eliquis  initiated  04/14/2024   3.  CVAs-visual disturbance and altered mental status 12/30/2023 MRI brain-many foci of restricted diffusion in the cerebellum, left frontal, left basal ganglia, and parietal lobes compatible with acute/subacute infarcts, consider embolic etiology 4.  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  Kidney stones 6.  Gout 7.  Family history of pancreas cancer 8.  History of basal cell carcinoma 9.  Negative genetic testing with VUS in EGFR.  First-degree relatives are candidates for considering pancreatic cancer screening based on family history.  Discussed with patient and wife 01/30/2024.        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  He has completed 14 treatments with gemcitabine /Abraxane .  The CA 19-9 was higher on 07/23/2024.  I reviewed the restaging CT findings and images with [name] and his wife.  There is evidence of disease progression involving multiple lung metastases.  His cough is most likely secondary to the lung metastases.  We discussed treatment options.  The gemcitabine /Abraxane  will be discontinued.  We discussed comfort care/hospice versus a trial of FOLFOX or FOLFIRINOX.  We reviewed potential toxicities associated with the FOLFIRINOX regimen including the chance of nausea/vomiting, mucositis, diarrhea, alopecia, hematologic toxicity, infection, and bleeding.  We discussed the rash, sun sensitivity, hyperpigmentation, and cardiac toxicity associated with 5-fluorouracil.  We reviewed the allergic reaction and various types of neuropathy seen with oxaliplatin.  We discussed the diarrhea associated with irinotecan.  [name] is undecided on whether to begin a trial of second line chemotherapy.  He would like to discuss options with his family  and return as scheduled on 08/20/2024.  Arley Hof, MD  08/07/2024  10:54 AM

## 2024-08-08 LAB — CANCER ANTIGEN 19-9: CA 19-9: 1618 U/mL — ABNORMAL HIGH (ref 0–35)

## 2024-08-10 ENCOUNTER — Encounter: Payer: Self-pay | Admitting: Oncology

## 2024-08-10 ENCOUNTER — Encounter: Payer: Self-pay | Admitting: Radiology

## 2024-08-11 ENCOUNTER — Other Ambulatory Visit: Payer: Self-pay | Admitting: Oncology

## 2024-08-11 DIAGNOSIS — C250 Malignant neoplasm of head of pancreas: Secondary | ICD-10-CM

## 2024-08-11 NOTE — Progress Notes (Signed)
 DISCONTINUE ON PATHWAY REGIMEN - Pancreatic Adenocarcinoma     A cycle is every 28 days:     Nab-paclitaxel  (protein bound)      Gemcitabine    **Always confirm dose/schedule in your pharmacy ordering system**  PRIOR TREATMENT: [id number]: Nab-Paclitaxel  (Abraxane ) 125 mg/m2 D1, 8, 15 + Gemcitabine  1,000 mg/m2 D1, 8, 15 q28 Days Until Progression or Toxicity  START OFF PATHWAY REGIMEN - Pancreatic Adenocarcinoma   [id number]:mFOLFIRINOX (Fluorouracil 2,400 mg/m2/cycle; Irinotecan 150 mg/m2) q14 Days:   A cycle is every 14 days:     Irinotecan      Oxaliplatin      Leucovorin      Fluorouracil   **Always confirm dose/schedule in your pharmacy ordering system**  Patient Characteristics: Metastatic Disease, Second Line, MSS/pMMR or MSI Unknown, Gemcitabine -Based Therapy First Line Therapeutic Status: Metastatic Disease Line of Therapy: Second Line Microsatellite/Mismatch Repair Status: MSS/pMMR Intent of Therapy: Non-Curative / Palliative Intent, Discussed with Patient

## 2024-08-11 NOTE — Progress Notes (Signed)
 Pharmacist Chemotherapy Monitoring - Initial Assessment    Anticipated start date: 08/19/24   The following has been reviewed per standard work regarding the patient's treatment regimen: The patient's diagnosis, treatment plan and drug doses, and organ/hematologic function Lab orders and baseline tests specific to treatment regimen  The treatment plan start date, drug sequencing, and pre-medications Prior authorization status  Patient's documented medication list, including drug-drug interaction screen and prescriptions for anti-emetics and supportive care specific to the treatment regimen The drug concentrations, fluid compatibility, administration routes, and timing of the medications to be used The patient's access for treatment and lifetime cumulative dose history, if applicable  The patient's medication allergies and previous infusion related reactions, if applicable   Changes made to treatment plan:  N/A  Follow up needed:  Pending authorization for treatment    Shaya Reddick Kreul, RPH, 08/11/2024  2:15 PM

## 2024-08-12 ENCOUNTER — Other Ambulatory Visit: Payer: Self-pay

## 2024-08-19 ENCOUNTER — Encounter: Payer: Self-pay | Admitting: Nurse Practitioner

## 2024-08-19 ENCOUNTER — Inpatient Hospital Stay (HOSPITAL_BASED_OUTPATIENT_CLINIC_OR_DEPARTMENT_OTHER): Admitting: Nurse Practitioner

## 2024-08-19 ENCOUNTER — Inpatient Hospital Stay: Attending: Oncology

## 2024-08-19 ENCOUNTER — Inpatient Hospital Stay

## 2024-08-19 ENCOUNTER — Other Ambulatory Visit: Payer: Self-pay

## 2024-08-19 VITALS — BP 143/78 | HR 79 | Temp 98.2°F | Resp 20

## 2024-08-19 VITALS — BP 120/62 | HR 73 | Temp 97.7°F | Resp 20 | Ht 66.0 in | Wt 232.4 lb

## 2024-08-19 DIAGNOSIS — C770 Secondary and unspecified malignant neoplasm of lymph nodes of head, face and neck: Secondary | ICD-10-CM | POA: Diagnosis present

## 2024-08-19 DIAGNOSIS — C250 Malignant neoplasm of head of pancreas: Secondary | ICD-10-CM | POA: Insufficient documentation

## 2024-08-19 DIAGNOSIS — C7800 Secondary malignant neoplasm of unspecified lung: Secondary | ICD-10-CM | POA: Diagnosis not present

## 2024-08-19 DIAGNOSIS — Z5111 Encounter for antineoplastic chemotherapy: Secondary | ICD-10-CM | POA: Insufficient documentation

## 2024-08-19 LAB — CBC WITH DIFFERENTIAL (CANCER CENTER ONLY)
Abs Immature Granulocytes: 0.12 K/uL — ABNORMAL HIGH (ref 0.00–0.07)
Basophils Absolute: 0.1 K/uL (ref 0.0–0.1)
Basophils Relative: 1 %
Eosinophils Absolute: 0.2 K/uL (ref 0.0–0.5)
Eosinophils Relative: 2 %
HCT: 39.5 % (ref 39.0–52.0)
Hemoglobin: 13.2 g/dL (ref 13.0–17.0)
Immature Granulocytes: 1 %
Lymphocytes Relative: 8 %
Lymphs Abs: 0.9 K/uL (ref 0.7–4.0)
MCH: 33.3 pg (ref 26.0–34.0)
MCHC: 33.4 g/dL (ref 30.0–36.0)
MCV: 99.7 fL (ref 80.0–100.0)
Monocytes Absolute: 0.8 K/uL (ref 0.1–1.0)
Monocytes Relative: 7 %
Neutro Abs: 8.5 K/uL — ABNORMAL HIGH (ref 1.7–7.7)
Neutrophils Relative %: 81 %
Platelet Count: 179 K/uL (ref 150–400)
RBC: 3.96 MIL/uL — ABNORMAL LOW (ref 4.22–5.81)
RDW: 16.3 % — ABNORMAL HIGH (ref 11.5–15.5)
WBC Count: 10.5 K/uL (ref 4.0–10.5)
nRBC: 0 % (ref 0.0–0.2)

## 2024-08-19 LAB — CMP (CANCER CENTER ONLY)
ALT: 28 U/L (ref 0–44)
AST: 26 U/L (ref 15–41)
Albumin: 3.5 g/dL (ref 3.5–5.0)
Alkaline Phosphatase: 56 U/L (ref 38–126)
Anion gap: 11 (ref 5–15)
BUN: 17 mg/dL (ref 8–23)
CO2: 24 mmol/L (ref 22–32)
Calcium: 9.9 mg/dL (ref 8.9–10.3)
Chloride: 104 mmol/L (ref 98–111)
Creatinine: 0.95 mg/dL (ref 0.61–1.24)
GFR, Estimated: >60 mL/min (ref >60)
Glucose, Bld: 169 mg/dL — ABNORMAL HIGH (ref 70–99)
Potassium: 4 mmol/L (ref 3.5–5.1)
Sodium: 139 mmol/L (ref 135–145)
Total Bilirubin: 0.4 mg/dL (ref 0.0–1.2)
Total Protein: 6.1 g/dL — ABNORMAL LOW (ref 6.5–8.1)

## 2024-08-19 MED ORDER — LEUCOVORIN CALCIUM INJECTION 350 MG
400.0000 mg/m2 | Freq: Once | INTRAVENOUS | Status: DC
  Filled 2024-08-19: qty 44.6

## 2024-08-19 MED ORDER — LEUCOVORIN CALCIUM INJECTION 350 MG
400.0000 mg/m2 | Freq: Once | INTRAVENOUS | Status: AC
  Administered 2024-08-19: 892 mg via INTRAVENOUS
  Filled 2024-08-19: qty 44.6

## 2024-08-19 MED ORDER — DEXTROSE 5 % IV SOLN: INTRAVENOUS | Status: DC

## 2024-08-19 MED ORDER — OXALIPLATIN CHEMO INJECTION 100 MG/20ML
65.0000 mg/m2 | Freq: Once | INTRAVENOUS | Status: AC
  Administered 2024-08-19: 150 mg via INTRAVENOUS
  Filled 2024-08-19: qty 20

## 2024-08-19 MED ORDER — DEXAMETHASONE SOD PHOSPHATE PF 10 MG/ML IJ SOLN
10.0000 mg | Freq: Once | INTRAMUSCULAR | Status: AC
  Administered 2024-08-19: 10 mg via INTRAVENOUS

## 2024-08-19 MED ORDER — PALONOSETRON HCL INJECTION 0.25 MG/5ML
0.2500 mg | Freq: Once | INTRAVENOUS | Status: AC
  Administered 2024-08-19: 0.25 mg via INTRAVENOUS
  Filled 2024-08-19: qty 5

## 2024-08-19 MED ORDER — SODIUM CHLORIDE 0.9 % IV SOLN
2400.0000 mg/m2 | INTRAVENOUS | Status: DC
  Administered 2024-08-19: 5000 mg via INTRAVENOUS
  Filled 2024-08-19: qty 100

## 2024-08-19 NOTE — Progress Notes (Signed)
 Patient seen by [name], NP today  Vitals are within treatment parameters:Yes   Labs are within treatment parameters: Yes   Treatment plan has been signed: Yes   Per physician team, Patient is ready for treatment and there are NO modifications to the treatment plan. No 5FU bolus

## 2024-08-19 NOTE — Progress Notes (Signed)
 Timothy Wilcox returns as scheduled.  Following his last office visit it was recommended treatment with gemcitabine /Abraxane  be discontinued due to progression.  Dr. Cloretta discussed comfort care/hospice versus a trial of FOLFOX or FOLFIRINOX.  Timothy Wilcox was undecided.  He later contacted the office indicating he wanted to proceed with additional treatment.  He is seen today prior to cycle 1 FOLFOX.  Timothy Wilcox confirms he would like to begin a trial of FOLFOX chemotherapy.  He overall feels better.  He has stable dyspnea.  Cough has improved since beginning Flonase .  Energy level overall is better.  He has a good appetite.  He has occasional low abdominal pain.  Objective:  Vital signs in last 24 hours:  Blood pressure 120/62, pulse 73, temperature 97.7 F (36.5 C), temperature source Oral, resp. rate 20, height 5' 6 (1.676 m), weight 232 lb 6.4 oz (105.4 kg), SpO2 99%.    HEENT: No thrush or ulcers. Resp: Lungs with scattered rhonchi.  No respiratory distress. Cardio: Regular rate and rhythm. GI: No hepatosplenomegaly.  Nontender.  No mass. Vascular: Trace pitting edema lower leg bilaterally. Port-A-Cath without erythema.  Lab Results:  Lab Results  Component Value Date   WBC 10.5 08/19/2024   HGB 13.2 08/19/2024   HCT 39.5 08/19/2024   MCV 99.7 08/19/2024   PLT 179 08/19/2024   NEUTROABS 8.5 (H) 08/19/2024    Imaging:  No results found.  Medications: I have reviewed the patient's current medications.  Assessment/Plan: Pancreas cancer 11/19/2022: 2.9 cm thin-walled cystic lesion in the pancreas uncinate 07/28/2023: CT abdomen/pelvis: Stable 2.8 cm cystic area in the uncinate process 11/29/2023: CT chest-subsegmental and segmental pulmonary emboli in the left upper lobe, dilation of the right ventricle, multiple pulmonary nodules 12/26/2023: CA 19-9-  66,286 12/27/2023: PET-5 cm hypermetabolic pancreas head/uncinate mass, numerous hypermetabolic pulmonary nodules, hypermetabolic left supraclavicular and retroperitoneal lymph nodes 01/06/2024: Left supraclavicular lymph node biopsy-metastatic moderately differentiated adenocarcinoma, 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, upper GI, breast, and salivary gland carcinoma Foundation 1 01/06/2024-HRDsig negative, microsatellite stable, tumor mutation burden 2, K-ras G12R Cycle 1 gemcitabine /Abraxane  01/16/2024 Cycle 2 gemcitabine /Abraxane  01/30/2024 Cycle 3 gemcitabine /Abraxane  02/13/2024 Cycle 4 gemcitabine /Abraxane  02/27/2024 Cycle 5 gemcitabine /Abraxane  03/12/2024 03/23/2024 CTs: Decrease size of pancreas head/uncinate mass, mixed response involving bilateral pulmonary nodules, decreased left supraclavicular retroperitoneal lymph nodes Cycle 6 gemcitabine /Abraxane  03/27/2024 Cycle 7 gemcitabine /Abraxane  04/09/2024 Cycle 8 gemcitabine /Abraxane  04/23/2024 Cycle 9 gemcitabine /Abraxane  05/07/2024 Cycle 10 gemcitabine /Abraxane  05/28/2024 Cycle 11 gemcitabine /Abraxane  06/12/2024 CTs 06/23/2024-increase in size of numerous bilateral irregular pulmonary nodules.  Pancreas head mass is similar in size.  Decreased size of left supraclavicular and retroperitoneal lymph nodes.  Per review by Dr. Cloretta, overall stable. Cycle 12 gemcitabine /Abraxane  06/25/2024, repeat noncontrast chest CT at a 6-week interval Cycle 13 gemcitabine /Abraxane  07/09/2024 Cycle 14 gemcitabine /Abraxane  07/23/2024 07/29/2024 CT chest: Progression of pulmonary metastases Cycle 1 FOLFOX 08/19/2024 Pulmonary embolism 11/29/2023 Small segmental and subsegmental pulmonary emboli in the left upper lobe, dilation of right ventricle Admitted for heparin  anticoagulation followed by apixaban -discharged 12/01/2023 Lower extremity Dopplers 11/30/2023: Acute left posterior tibial, peroneal, and TP trunk DVTs, acute right peroneal  DVT Lovenox  12/31/2023 Lovenox  discontinued, Eliquis  initiated 04/14/2024   3.  CVAs-visual disturbance and altered mental status 12/30/2023 MRI brain-many foci of restricted diffusion in the cerebellum, left frontal, left basal ganglia, and parietal lobes compatible with acute/subacute infarcts, consider embolic etiology 4.  CAD, status post acute inferior MI June 2018, RCA stent  5.  Kidney stones 6.  Gout 7.  Family history of pancreas cancer 8.  History of basal cell carcinoma 9.  Negative genetic testing with VUS in EGFR.  First-degree relatives are candidates for considering pancreatic cancer screening based on family history.  Discussed with patient and wife 01/30/2024.        Disposition: Timothy Wilcox appears stable.  Performance status is better.  We again reviewed comfort care/hospice versus a trial of second-line chemotherapy.  He would like to proceed with chemotherapy.  We again reviewed potential toxicities associated with FOLFOX/FOLFIRINOX.  Plan to proceed with FOLFOX today as scheduled.  CBC and chemistry panel reviewed.  Labs are adequate for treatment.  He will return for follow-up and cycle 2 FOLFOX in 2 weeks.  We are available to see him sooner if needed.    [name] ANP/GNP-BC   08/19/2024  12:04 PM

## 2024-08-19 NOTE — Patient Instructions (Signed)

## 2024-08-19 NOTE — Patient Instructions (Signed)
 [facility] - A DEPT OF [place]  Discharge Instructions: Thank you for choosing [facility] to provide your oncology and hematology care.   If you have a lab appointment with the Cancer Center, please go directly to the Cancer Center and check in at the registration area.   Wear comfortable clothing and clothing appropriate for easy access to any Portacath or PICC line.   We strive to give you quality time with your provider. You may need to reschedule your appointment if you arrive late (15 or more minutes).  Arriving late affects you and other patients whose appointments are after yours.  Also, if you miss three or more appointments without notifying the office, you may be dismissed from the clinic at the provider's discretion.      For prescription refill requests, have your pharmacy contact our office and allow 72 hours for refills to be completed.    Today you received the following chemotherapy and/or immunotherapy agents Oxaliplatin, Leucovorin, and Fluorouracil.       To help prevent nausea and vomiting after your treatment, we encourage you to take your nausea medication as directed.  BELOW ARE SYMPTOMS THAT SHOULD BE REPORTED IMMEDIATELY: *FEVER GREATER THAN 100.4 F (38 C) OR HIGHER *CHILLS OR SWEATING *NAUSEA AND VOMITING THAT IS NOT CONTROLLED WITH YOUR NAUSEA MEDICATION *UNUSUAL SHORTNESS OF BREATH *UNUSUAL BRUISING OR BLEEDING *URINARY PROBLEMS (pain or burning when urinating, or frequent urination) *BOWEL PROBLEMS (unusual diarrhea, constipation, pain near the anus) TENDERNESS IN MOUTH AND THROAT WITH OR WITHOUT PRESENCE OF ULCERS (sore throat, sores in mouth, or a toothache) UNUSUAL RASH, SWELLING OR PAIN  UNUSUAL VAGINAL DISCHARGE OR ITCHING   Items with * indicate a potential emergency and should be followed up as soon as possible or go to the Emergency Department if any problems should occur.  Please show the  CHEMOTHERAPY ALERT CARD or IMMUNOTHERAPY ALERT CARD at check-in to the Emergency Department and triage nurse.  Should you have questions after your visit or need to cancel or reschedule your appointment, please contact [facility] - A DEPT OF [facility]  Dept: [phone]  and follow the prompts.  Office hours are 8:00 a.m. to 4:30 p.m. Monday - Friday. Please note that voicemails left after 4:00 p.m. may not be returned until the following business day.  We are closed weekends and major holidays. You have access to a nurse at all times for urgent questions. Please call the main number to the clinic Dept: [phone] and follow the prompts.   For any non-urgent questions, you may also contact your provider using MyChart. We now offer e-Visits for anyone 78 and older to request care online for non-urgent symptoms. For details visit [email or web address].   Also download the MyChart app! Go to the app store, search MyChart, open the app, select , and log in with your MyChart username and password.

## 2024-08-20 ENCOUNTER — Inpatient Hospital Stay

## 2024-08-20 ENCOUNTER — Other Ambulatory Visit: Payer: Self-pay

## 2024-08-20 ENCOUNTER — Inpatient Hospital Stay: Admitting: Nurse Practitioner

## 2024-08-20 LAB — CANCER ANTIGEN 19-9: CA 19-9: 2519 U/mL — ABNORMAL HIGH (ref 0–35)

## 2024-08-20 NOTE — Progress Notes (Signed)
 24 Hour Call Back  Reached out to patient regarding first time infusion. Patient states he is doing well with no side effects. Educated patient to reach out with any questions or concerns.

## 2024-08-21 ENCOUNTER — Inpatient Hospital Stay

## 2024-08-21 VITALS — BP 120/73 | HR 71 | Temp 98.2°F | Resp 20

## 2024-08-21 DIAGNOSIS — C250 Malignant neoplasm of head of pancreas: Secondary | ICD-10-CM

## 2024-08-21 NOTE — Patient Instructions (Signed)

## 2024-08-22 ENCOUNTER — Inpatient Hospital Stay

## 2024-08-25 MED FILL — Amlodipine Besylate Tab 5 MG (Base Equivalent): ORAL | 90 days supply | Qty: 90 | Fill #1 | Status: AC

## 2024-08-26 ENCOUNTER — Other Ambulatory Visit (HOSPITAL_BASED_OUTPATIENT_CLINIC_OR_DEPARTMENT_OTHER): Payer: Self-pay

## 2024-08-28 ENCOUNTER — Other Ambulatory Visit (HOSPITAL_BASED_OUTPATIENT_CLINIC_OR_DEPARTMENT_OTHER): Payer: Self-pay

## 2024-08-28 ENCOUNTER — Other Ambulatory Visit: Payer: Self-pay

## 2024-08-28 ENCOUNTER — Encounter: Payer: Self-pay | Admitting: Oncology

## 2024-08-30 ENCOUNTER — Other Ambulatory Visit: Payer: Self-pay | Admitting: Oncology

## 2024-08-31 ENCOUNTER — Other Ambulatory Visit (HOSPITAL_BASED_OUTPATIENT_CLINIC_OR_DEPARTMENT_OTHER): Payer: Self-pay

## 2024-08-31 DIAGNOSIS — M5459 Other low back pain: Secondary | ICD-10-CM | POA: Diagnosis not present

## 2024-08-31 MED ORDER — TIZANIDINE HCL 2 MG PO TABS
2.0000 mg | ORAL_TABLET | Freq: Two times a day (BID) | ORAL | 0 refills | Status: AC
  Filled 2024-08-31: qty 20, 10d supply, fill #0

## 2024-09-01 ENCOUNTER — Telehealth: Payer: Self-pay

## 2024-09-01 ENCOUNTER — Inpatient Hospital Stay

## 2024-09-01 ENCOUNTER — Inpatient Hospital Stay: Admitting: Nurse Practitioner

## 2024-09-01 ENCOUNTER — Encounter: Payer: Self-pay | Admitting: Nurse Practitioner

## 2024-09-01 VITALS — BP 108/55 | HR 76 | Temp 97.7°F | Resp 20 | Ht 66.0 in | Wt 233.5 lb

## 2024-09-01 DIAGNOSIS — Z5111 Encounter for antineoplastic chemotherapy: Secondary | ICD-10-CM | POA: Diagnosis not present

## 2024-09-01 DIAGNOSIS — C250 Malignant neoplasm of head of pancreas: Secondary | ICD-10-CM

## 2024-09-01 LAB — CMP (CANCER CENTER ONLY)
ALT: 26 U/L (ref 0–44)
AST: 25 U/L (ref 15–41)
Albumin: 3.7 g/dL (ref 3.5–5.0)
Alkaline Phosphatase: 60 U/L (ref 38–126)
Anion gap: 13 (ref 5–15)
BUN: 16 mg/dL (ref 8–23)
CO2: 22 mmol/L (ref 22–32)
Calcium: 9.6 mg/dL (ref 8.9–10.3)
Chloride: 103 mmol/L (ref 98–111)
Creatinine: 1.04 mg/dL (ref 0.61–1.24)
GFR, Estimated: >60 mL/min (ref >60)
Glucose, Bld: 185 mg/dL — ABNORMAL HIGH (ref 70–99)
Potassium: 4.3 mmol/L (ref 3.5–5.1)
Sodium: 137 mmol/L (ref 135–145)
Total Bilirubin: 0.3 mg/dL (ref 0.0–1.2)
Total Protein: 5.8 g/dL — ABNORMAL LOW (ref 6.5–8.1)

## 2024-09-01 LAB — CBC WITH DIFFERENTIAL (CANCER CENTER ONLY)
Abs Immature Granulocytes: 0.09 K/uL — ABNORMAL HIGH (ref 0.00–0.07)
Basophils Absolute: 0.1 K/uL (ref 0.0–0.1)
Basophils Relative: 1 %
Eosinophils Absolute: 0.1 K/uL (ref 0.0–0.5)
Eosinophils Relative: 1 %
HCT: 39 % (ref 39.0–52.0)
Hemoglobin: 13.1 g/dL (ref 13.0–17.0)
Immature Granulocytes: 1 %
Lymphocytes Relative: 4 %
Lymphs Abs: 0.6 K/uL — ABNORMAL LOW (ref 0.7–4.0)
MCH: 33.2 pg (ref 26.0–34.0)
MCHC: 33.6 g/dL (ref 30.0–36.0)
MCV: 99 fL (ref 80.0–100.0)
Monocytes Absolute: 0.3 K/uL (ref 0.1–1.0)
Monocytes Relative: 2 %
Neutro Abs: 12.1 K/uL — ABNORMAL HIGH (ref 1.7–7.7)
Neutrophils Relative %: 91 %
Platelet Count: 140 K/uL — ABNORMAL LOW (ref 150–400)
RBC: 3.94 MIL/uL — ABNORMAL LOW (ref 4.22–5.81)
RDW: 15.8 % — ABNORMAL HIGH (ref 11.5–15.5)
WBC Count: 13.3 K/uL — ABNORMAL HIGH (ref 4.0–10.5)
nRBC: 0 % (ref 0.0–0.2)

## 2024-09-01 NOTE — Telephone Encounter (Addendum)
 Called [facility] asking to send in the prescription for the lidocaine  pain relief patch  Katelyna called back and states she is going to forward this request to the provider.

## 2024-09-01 NOTE — Progress Notes (Signed)
 Patient seen by [name] NP today  Vitals are within treatment parameters:Yes   Labs are within treatment parameters: Yes   Treatment plan has been signed: Yes   Per physician team, Patient will not be receiving treatment today. No chemo on 11/26/225

## 2024-09-01 NOTE — Progress Notes (Signed)
 [facility] OFFICE PROGRESS NOTE   Diagnosis: Pancreas cancer  INTERVAL HISTORY:   Mr. Couser returns as scheduled.  He completed cycle 1 FOLFOX 08/19/2024.  He reports he did not tolerate treatment well.  He was much more fatigued than with the prior chemotherapy.  Energy level only recently improved.  No nausea or vomiting.  No mouth sores.  No diarrhea.  He did not experience cold sensitivity.  He has developed numbness in his feet and lower legs, fingers and arms.  He feels the symptoms are worsening.  He pulled a lower back muscle.  He was recently prescribed a muscle relaxant.  He continues to have dyspnea.  Cough is less frequent.  Objective:  Vital signs in last 24 hours:  Blood pressure (!) 108/55, pulse 76, temperature 97.7 F (36.5 C), temperature source Temporal, resp. rate 20, height 5' 6 (1.676 m), weight 233 lb 8 oz (105.9 kg), SpO2 99%.    HEENT: No thrush or ulcers. Resp: Rhonchi at both lung bases.  No respiratory distress. Cardio: Regular rate and rhythm. GI: No hepatosplenomegaly.  Nontender. Vascular: Trace edema lower leg bilaterally. Neuro: Vibratory sense mildly decreased over the fingertips per tuning fork exam. Skin: Palms without erythema. Port-A-Cath without erythema.  Lab Results:  Lab Results  Component Value Date   WBC 13.3 (H) 09/01/2024   HGB 13.1 09/01/2024   HCT 39.0 09/01/2024   MCV 99.0 09/01/2024   PLT 140 (L) 09/01/2024   NEUTROABS 12.1 (H) 09/01/2024    Imaging:  No results found.  Medications: I have reviewed the patient's current medications.  Assessment/Plan: Pancreas cancer 11/19/2022: 2.9 cm thin-walled cystic lesion in the pancreas uncinate 07/28/2023: CT abdomen/pelvis: Stable 2.8 cm cystic area in the uncinate process 11/29/2023: CT chest-subsegmental and segmental pulmonary emboli in the left upper lobe, dilation of the right ventricle, multiple pulmonary nodules 12/26/2023: CA 19-9- 66,286 12/27/2023:  PET-5 cm hypermetabolic pancreas head/uncinate mass, numerous hypermetabolic pulmonary nodules, hypermetabolic left supraclavicular and retroperitoneal lymph nodes 01/06/2024: Left supraclavicular lymph node biopsy-metastatic moderately differentiated adenocarcinoma, 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, upper GI, breast, and salivary gland carcinoma Foundation 1 01/06/2024-HRDsig negative, microsatellite stable, tumor mutation burden 2, K-ras G12R Cycle 1 gemcitabine /Abraxane  01/16/2024 Cycle 2 gemcitabine /Abraxane  01/30/2024 Cycle 3 gemcitabine /Abraxane  02/13/2024 Cycle 4 gemcitabine /Abraxane  02/27/2024 Cycle 5 gemcitabine /Abraxane  03/12/2024 03/23/2024 CTs: Decrease size of pancreas head/uncinate mass, mixed response involving bilateral pulmonary nodules, decreased left supraclavicular retroperitoneal lymph nodes Cycle 6 gemcitabine /Abraxane  03/27/2024 Cycle 7 gemcitabine /Abraxane  04/09/2024 Cycle 8 gemcitabine /Abraxane  04/23/2024 Cycle 9 gemcitabine /Abraxane  05/07/2024 Cycle 10 gemcitabine /Abraxane  05/28/2024 Cycle 11 gemcitabine /Abraxane  06/12/2024 CTs 06/23/2024-increase in size of numerous bilateral irregular pulmonary nodules.  Pancreas head mass is similar in size.  Decreased size of left supraclavicular and retroperitoneal lymph nodes.  Per review by Dr. Cloretta, overall stable. Cycle 12 gemcitabine /Abraxane  06/25/2024, repeat noncontrast chest CT at a 6-week interval Cycle 13 gemcitabine /Abraxane  07/09/2024 Cycle 14 gemcitabine /Abraxane  07/23/2024 07/29/2024 CT chest: Progression of pulmonary metastases Cycle 1 FOLFOX 08/19/2024 Pulmonary embolism 11/29/2023 Small segmental and subsegmental pulmonary emboli in the left upper lobe, dilation of right ventricle Admitted for heparin  anticoagulation followed by apixaban -discharged 12/01/2023 Lower extremity Dopplers 11/30/2023: Acute left posterior tibial, peroneal, and TP trunk DVTs, acute right peroneal DVT Lovenox   12/31/2023 Lovenox  discontinued, Eliquis  initiated 04/14/2024   3.  CVAs-visual disturbance and altered mental status 12/30/2023 MRI brain-many foci of restricted diffusion in the cerebellum, left frontal, left basal ganglia, and parietal lobes compatible with acute/subacute infarcts, consider embolic etiology 4.  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  Kidney stones 6.  Gout 7.  Family history of pancreas cancer 8.  History of basal cell carcinoma 9.  Negative genetic testing with VUS in EGFR.  First-degree relatives are candidates for considering pancreatic cancer screening based on family history.  Discussed with patient and wife 01/30/2024.      Disposition: Mr. Dunavan appears stable.  He completed a cycle of FOLFOX 08/19/2024.  He developed significant fatigue, only improving a few days ago.  He has numbness in his hands/arms and feet/legs.  This began following treatment and he feels it is worsening.  We mutually decided to hold tomorrow's treatment to give him more time to recover and to see if the numbness improves.  CBC and chemistry panel reviewed.  He has mild thrombocytopenia, labs otherwise look good.  He will return for follow-up and possible treatment next week.  We are available to see him sooner if needed.    [name] ANP/GNP-BC   09/01/2024  2:40 PM

## 2024-09-01 NOTE — Patient Instructions (Signed)

## 2024-09-02 ENCOUNTER — Encounter: Payer: Self-pay | Admitting: Oncology

## 2024-09-02 ENCOUNTER — Inpatient Hospital Stay

## 2024-09-02 ENCOUNTER — Other Ambulatory Visit (HOSPITAL_BASED_OUTPATIENT_CLINIC_OR_DEPARTMENT_OTHER): Payer: Self-pay

## 2024-09-02 LAB — CANCER ANTIGEN 19-9: CA 19-9: 3566 U/mL — ABNORMAL HIGH (ref 0–35)

## 2024-09-02 MED ORDER — LIDOCAINE 5 % EX PTCH
1.0000 | MEDICATED_PATCH | Freq: Every day | CUTANEOUS | 0 refills | Status: DC
  Filled 2024-09-02: qty 30, 30d supply, fill #0

## 2024-09-03 ENCOUNTER — Other Ambulatory Visit: Payer: Self-pay

## 2024-09-04 ENCOUNTER — Inpatient Hospital Stay

## 2024-09-05 ENCOUNTER — Other Ambulatory Visit (HOSPITAL_BASED_OUTPATIENT_CLINIC_OR_DEPARTMENT_OTHER): Payer: Self-pay

## 2024-09-09 ENCOUNTER — Other Ambulatory Visit (HOSPITAL_BASED_OUTPATIENT_CLINIC_OR_DEPARTMENT_OTHER): Payer: Self-pay

## 2024-09-09 ENCOUNTER — Inpatient Hospital Stay

## 2024-09-09 ENCOUNTER — Encounter: Payer: Self-pay | Admitting: Nurse Practitioner

## 2024-09-09 ENCOUNTER — Inpatient Hospital Stay: Admitting: Nurse Practitioner

## 2024-09-09 ENCOUNTER — Inpatient Hospital Stay: Attending: Oncology

## 2024-09-09 VITALS — BP 119/58 | HR 76 | Temp 97.8°F | Resp 18 | Ht 66.0 in | Wt 232.0 lb

## 2024-09-09 VITALS — BP 139/67 | HR 70 | Temp 98.0°F | Resp 17

## 2024-09-09 DIAGNOSIS — C250 Malignant neoplasm of head of pancreas: Secondary | ICD-10-CM | POA: Insufficient documentation

## 2024-09-09 DIAGNOSIS — C770 Secondary and unspecified malignant neoplasm of lymph nodes of head, face and neck: Secondary | ICD-10-CM | POA: Diagnosis present

## 2024-09-09 DIAGNOSIS — C7800 Secondary malignant neoplasm of unspecified lung: Secondary | ICD-10-CM | POA: Diagnosis not present

## 2024-09-09 DIAGNOSIS — Z5111 Encounter for antineoplastic chemotherapy: Secondary | ICD-10-CM | POA: Insufficient documentation

## 2024-09-09 LAB — CMP (CANCER CENTER ONLY)
ALT: 27 U/L (ref 0–44)
AST: 29 U/L (ref 15–41)
Albumin: 3.7 g/dL (ref 3.5–5.0)
Alkaline Phosphatase: 61 U/L (ref 38–126)
Anion gap: 12 (ref 5–15)
BUN: 15 mg/dL (ref 8–23)
CO2: 23 mmol/L (ref 22–32)
Calcium: 9.7 mg/dL (ref 8.9–10.3)
Chloride: 104 mmol/L (ref 98–111)
Creatinine: 1.02 mg/dL (ref 0.61–1.24)
GFR, Estimated: >60 mL/min (ref >60)
Glucose, Bld: 195 mg/dL — ABNORMAL HIGH (ref 70–99)
Potassium: 3.9 mmol/L (ref 3.5–5.1)
Sodium: 140 mmol/L (ref 135–145)
Total Bilirubin: 0.3 mg/dL (ref 0.0–1.2)
Total Protein: 6 g/dL — ABNORMAL LOW (ref 6.5–8.1)

## 2024-09-09 LAB — CBC WITH DIFFERENTIAL (CANCER CENTER ONLY)
Abs Immature Granulocytes: 0.17 K/uL — ABNORMAL HIGH (ref 0.00–0.07)
Basophils Absolute: 0.1 K/uL (ref 0.0–0.1)
Basophils Relative: 1 %
Eosinophils Absolute: 0.2 K/uL (ref 0.0–0.5)
Eosinophils Relative: 3 %
HCT: 39.1 % (ref 39.0–52.0)
Hemoglobin: 13.1 g/dL (ref 13.0–17.0)
Immature Granulocytes: 2 %
Lymphocytes Relative: 16 %
Lymphs Abs: 1.3 K/uL (ref 0.7–4.0)
MCH: 32.7 pg (ref 26.0–34.0)
MCHC: 33.5 g/dL (ref 30.0–36.0)
MCV: 97.5 fL (ref 80.0–100.0)
Monocytes Absolute: 0.8 K/uL (ref 0.1–1.0)
Monocytes Relative: 10 %
Neutro Abs: 5.9 K/uL (ref 1.7–7.7)
Neutrophils Relative %: 68 %
Platelet Count: 218 K/uL (ref 150–400)
RBC: 4.01 MIL/uL — ABNORMAL LOW (ref 4.22–5.81)
RDW: 15.1 % (ref 11.5–15.5)
WBC Count: 8.5 K/uL (ref 4.0–10.5)
nRBC: 0 % (ref 0.0–0.2)

## 2024-09-09 MED ORDER — DEXAMETHASONE SOD PHOSPHATE PF 10 MG/ML IJ SOLN
10.0000 mg | Freq: Once | INTRAMUSCULAR | Status: AC
  Administered 2024-09-09: 10 mg via INTRAVENOUS

## 2024-09-09 MED ORDER — DEXTROSE 5 % IV SOLN: INTRAVENOUS | Status: DC

## 2024-09-09 MED ORDER — PALONOSETRON HCL INJECTION 0.25 MG/5ML
0.2500 mg | Freq: Once | INTRAVENOUS | Status: AC
  Administered 2024-09-09: 0.25 mg via INTRAVENOUS
  Filled 2024-09-09: qty 5

## 2024-09-09 MED ORDER — SODIUM CHLORIDE 0.9 % IV SOLN
2400.0000 mg/m2 | INTRAVENOUS | Status: DC
  Administered 2024-09-09: 5000 mg via INTRAVENOUS
  Filled 2024-09-09: qty 100

## 2024-09-09 MED ORDER — OXALIPLATIN CHEMO INJECTION 100 MG/20ML
65.0000 mg/m2 | Freq: Once | INTRAVENOUS | Status: AC
  Administered 2024-09-09: 150 mg via INTRAVENOUS
  Filled 2024-09-09: qty 20

## 2024-09-09 MED ORDER — LEUCOVORIN CALCIUM INJECTION 350 MG
400.0000 mg/m2 | Freq: Once | INTRAVENOUS | Status: AC
  Administered 2024-09-09: 892 mg via INTRAVENOUS
  Filled 2024-09-09: qty 44.6

## 2024-09-09 NOTE — Progress Notes (Signed)
 Patient seen by [name] NP today  Vitals are within treatment parameters:Yes   Labs are within treatment parameters: Yes   Treatment plan has been signed: Yes   Per physician team, Patient is ready for treatment and there are NO modifications to the treatment plan.

## 2024-09-09 NOTE — Progress Notes (Signed)
 [facility] OFFICE PROGRESS NOTE   Diagnosis: Pancreas cancer  INTERVAL HISTORY:   Timothy Wilcox returns as scheduled.  He has completed 1 cycle of FOLFOX.  Cycle 2 was held due to poor tolerance with significant fatigue and numbness in the extremities that seem to be worsening.  He is seen today for reevaluation.  He reports the numbness has decreased in severity but is still present in the same distribution, feet to calves extending to the thighs at times and fingertips to inner arms.  The numbness does not interfere with activity.  Fatigue is now intermittent.  No nausea or vomiting.  No mouth sores.  No diarrhea.  No cold sensitivity.  Cough is less frequent.  Objective:  Vital signs in last 24 hours:  Blood pressure (!) 119/58, pulse 76, temperature 97.8 F (36.6 C), temperature source Temporal, resp. rate 18, height 5' 6 (1.676 m), weight 232 lb (105.2 kg), SpO2 98%.    HEENT: No thrush or ulcers. Resp: Distant breath sounds.  No respiratory distress. Cardio: Regular rate and rhythm. GI: No hepatosplenomegaly.  Nontender. Vascular: Trace pitting edema lower leg bilaterally. Skin: Palms without erythema. Port-A-Cath without erythema.  Lab Results:  Lab Results  Component Value Date   WBC 13.3 (H) 09/01/2024   HGB 13.1 09/01/2024   HCT 39.0 09/01/2024   MCV 99.0 09/01/2024   PLT 140 (L) 09/01/2024   NEUTROABS 12.1 (H) 09/01/2024    Imaging:  No results found.  Medications: I have reviewed the patient's current medications.  Assessment/Plan: Pancreas cancer 11/19/2022: 2.9 cm thin-walled cystic lesion in the pancreas uncinate 07/28/2023: CT abdomen/pelvis: Stable 2.8 cm cystic area in the uncinate process 11/29/2023: CT chest-subsegmental and segmental pulmonary emboli in the left upper lobe, dilation of the right ventricle, multiple pulmonary nodules 12/26/2023: CA 19-9- 66,286 12/27/2023: PET-5 cm hypermetabolic pancreas head/uncinate mass,  numerous hypermetabolic pulmonary nodules, hypermetabolic left supraclavicular and retroperitoneal lymph nodes 01/06/2024: Left supraclavicular lymph node biopsy-metastatic moderately differentiated adenocarcinoma, 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, upper GI, breast, and salivary gland carcinoma Foundation 1 01/06/2024-HRDsig negative, microsatellite stable, tumor mutation burden 2, K-ras G12R Cycle 1 gemcitabine /Abraxane  01/16/2024 Cycle 2 gemcitabine /Abraxane  01/30/2024 Cycle 3 gemcitabine /Abraxane  02/13/2024 Cycle 4 gemcitabine /Abraxane  02/27/2024 Cycle 5 gemcitabine /Abraxane  03/12/2024 03/23/2024 CTs: Decrease size of pancreas head/uncinate mass, mixed response involving bilateral pulmonary nodules, decreased left supraclavicular retroperitoneal lymph nodes Cycle 6 gemcitabine /Abraxane  03/27/2024 Cycle 7 gemcitabine /Abraxane  04/09/2024 Cycle 8 gemcitabine /Abraxane  04/23/2024 Cycle 9 gemcitabine /Abraxane  05/07/2024 Cycle 10 gemcitabine /Abraxane  05/28/2024 Cycle 11 gemcitabine /Abraxane  06/12/2024 CTs 06/23/2024-increase in size of numerous bilateral irregular pulmonary nodules.  Pancreas head mass is similar in size.  Decreased size of left supraclavicular and retroperitoneal lymph nodes.  Per review by Dr. Cloretta, overall stable. Cycle 12 gemcitabine /Abraxane  06/25/2024, repeat noncontrast chest CT at a 6-week interval Cycle 13 gemcitabine /Abraxane  07/09/2024 Cycle 14 gemcitabine /Abraxane  07/23/2024 07/29/2024 CT chest: Progression of pulmonary metastases Cycle 1 FOLFOX 08/19/2024 Cycle 2 FOLFOX 09/09/2024 Pulmonary embolism 11/29/2023 Small segmental and subsegmental pulmonary emboli in the left upper lobe, dilation of right ventricle Admitted for heparin  anticoagulation followed by apixaban -discharged 12/01/2023 Lower extremity Dopplers 11/30/2023: Acute left posterior tibial, peroneal, and TP trunk DVTs, acute right peroneal DVT Lovenox  12/31/2023 Lovenox  discontinued, Eliquis   initiated 04/14/2024   3.  CVAs-visual disturbance and altered mental status 12/30/2023 MRI brain-many foci of restricted diffusion in the cerebellum, left frontal, left basal ganglia, and parietal lobes compatible with acute/subacute infarcts, consider embolic etiology 4.  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  Kidney stones 6.  Gout 7.  Family history of pancreas cancer 8.  History of basal cell carcinoma 9.  Negative genetic testing with VUS in EGFR.  First-degree relatives are candidates for considering pancreatic cancer screening based on family history.  Discussed with patient and wife 01/30/2024.      Disposition: Timothy Wilcox appears unchanged.  He has completed 1 cycle of FOLFOX.  Cycle 2 was held last week due to significant fatigue and numbness in the extremities.  The fatigue has improved.  The numbness has decreased in severity but is still present.  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  He agrees to proceed with cycle 2 FOLFOX today as scheduled and understands current symptoms could worsen.  CBC and chemistry panel reviewed.  Labs are adequate for treatment.  He will return for follow-up and cycle 3 FOLFOX in 2 weeks.  He will contact the office in the interim with any problems.  Plan reviewed with Dr. Cloretta.  Timothy Wilcox ANP/GNP-BC   09/09/2024  10:35 AM

## 2024-09-09 NOTE — Patient Instructions (Signed)
 [facility] - A DEPT OF [place]  Discharge Instructions: Thank you for choosing [facility] to provide your oncology and hematology care.   If you have a lab appointment with the Cancer Center, please go directly to the Cancer Center and check in at the registration area.   Wear comfortable clothing and clothing appropriate for easy access to any Portacath or PICC line.   We strive to give you quality time with your provider. You may need to reschedule your appointment if you arrive late (15 or more minutes).  Arriving late affects you and other patients whose appointments are after yours.  Also, if you miss three or more appointments without notifying the office, you may be dismissed from the clinic at the provider's discretion.      For prescription refill requests, have your pharmacy contact our office and allow 72 hours for refills to be completed.    Today you received the following chemotherapy and/or immunotherapy agents Oxaliplatin, Leucovorin, and Fluorouracil.       To help prevent nausea and vomiting after your treatment, we encourage you to take your nausea medication as directed.  BELOW ARE SYMPTOMS THAT SHOULD BE REPORTED IMMEDIATELY: *FEVER GREATER THAN 100.4 F (38 C) OR HIGHER *CHILLS OR SWEATING *NAUSEA AND VOMITING THAT IS NOT CONTROLLED WITH YOUR NAUSEA MEDICATION *UNUSUAL SHORTNESS OF BREATH *UNUSUAL BRUISING OR BLEEDING *URINARY PROBLEMS (pain or burning when urinating, or frequent urination) *BOWEL PROBLEMS (unusual diarrhea, constipation, pain near the anus) TENDERNESS IN MOUTH AND THROAT WITH OR WITHOUT PRESENCE OF ULCERS (sore throat, sores in mouth, or a toothache) UNUSUAL RASH, SWELLING OR PAIN  UNUSUAL VAGINAL DISCHARGE OR ITCHING   Items with * indicate a potential emergency and should be followed up as soon as possible or go to the Emergency Department if any problems should occur.  Please show the  CHEMOTHERAPY ALERT CARD or IMMUNOTHERAPY ALERT CARD at check-in to the Emergency Department and triage nurse.  Should you have questions after your visit or need to cancel or reschedule your appointment, please contact [facility] - A DEPT OF [facility]  Dept: [phone]  and follow the prompts.  Office hours are 8:00 a.m. to 4:30 p.m. Monday - Friday. Please note that voicemails left after 4:00 p.m. may not be returned until the following business day.  We are closed weekends and major holidays. You have access to a nurse at all times for urgent questions. Please call the main number to the clinic Dept: [phone] and follow the prompts.   For any non-urgent questions, you may also contact your provider using MyChart. We now offer e-Visits for anyone 78 and older to request care online for non-urgent symptoms. For details visit [email or web address].   Also download the MyChart app! Go to the app store, search MyChart, open the app, select , and log in with your MyChart username and password.

## 2024-09-10 ENCOUNTER — Other Ambulatory Visit: Payer: Self-pay

## 2024-09-11 ENCOUNTER — Inpatient Hospital Stay

## 2024-09-11 VITALS — BP 138/73 | HR 79 | Temp 97.4°F | Resp 18

## 2024-09-11 DIAGNOSIS — C250 Malignant neoplasm of head of pancreas: Secondary | ICD-10-CM

## 2024-09-13 ENCOUNTER — Emergency Department (HOSPITAL_COMMUNITY)

## 2024-09-13 ENCOUNTER — Encounter (HOSPITAL_BASED_OUTPATIENT_CLINIC_OR_DEPARTMENT_OTHER): Payer: Self-pay

## 2024-09-13 ENCOUNTER — Other Ambulatory Visit: Payer: Self-pay

## 2024-09-13 ENCOUNTER — Observation Stay (HOSPITAL_COMMUNITY)
Admission: EM | Admit: 2024-09-13 | Discharge: 2024-09-14 | Disposition: A | Attending: Internal Medicine | Admitting: Internal Medicine

## 2024-09-13 DIAGNOSIS — I10 Essential (primary) hypertension: Secondary | ICD-10-CM | POA: Diagnosis not present

## 2024-09-13 DIAGNOSIS — Z8673 Personal history of transient ischemic attack (TIA), and cerebral infarction without residual deficits: Secondary | ICD-10-CM

## 2024-09-13 DIAGNOSIS — R918 Other nonspecific abnormal finding of lung field: Secondary | ICD-10-CM | POA: Diagnosis not present

## 2024-09-13 DIAGNOSIS — I2119 ST elevation (STEMI) myocardial infarction involving other coronary artery of inferior wall: Secondary | ICD-10-CM | POA: Diagnosis not present

## 2024-09-13 DIAGNOSIS — I2510 Atherosclerotic heart disease of native coronary artery without angina pectoris: Secondary | ICD-10-CM | POA: Diagnosis present

## 2024-09-13 DIAGNOSIS — I214 Non-ST elevation (NSTEMI) myocardial infarction: Secondary | ICD-10-CM | POA: Diagnosis not present

## 2024-09-13 DIAGNOSIS — Z86711 Personal history of pulmonary embolism: Secondary | ICD-10-CM | POA: Diagnosis present

## 2024-09-13 DIAGNOSIS — I700 Atherosclerosis of aorta: Secondary | ICD-10-CM | POA: Diagnosis not present

## 2024-09-13 DIAGNOSIS — C250 Malignant neoplasm of head of pancreas: Secondary | ICD-10-CM | POA: Diagnosis present

## 2024-09-13 DIAGNOSIS — R079 Chest pain, unspecified: Secondary | ICD-10-CM | POA: Diagnosis not present

## 2024-09-13 DIAGNOSIS — Z8507 Personal history of malignant neoplasm of pancreas: Secondary | ICD-10-CM | POA: Diagnosis not present

## 2024-09-13 LAB — BASIC METABOLIC PANEL WITH GFR
Anion gap: 8 (ref 5–15)
BUN: 18 mg/dL (ref 8–23)
CO2: 27 mmol/L (ref 22–32)
Calcium: 9 mg/dL (ref 8.9–10.3)
Chloride: 107 mmol/L (ref 98–111)
Creatinine, Ser: 1.13 mg/dL (ref 0.61–1.24)
GFR, Estimated: >60 mL/min (ref >60)
Glucose, Bld: 161 mg/dL — ABNORMAL HIGH (ref 70–99)
Potassium: 4 mmol/L (ref 3.5–5.1)
Sodium: 142 mmol/L (ref 135–145)

## 2024-09-13 LAB — TROPONIN I (HIGH SENSITIVITY)
Troponin I (High Sensitivity): 84 ng/L — ABNORMAL HIGH (ref <18)
Troponin I (High Sensitivity): 238 ng/L (ref <18)
Troponin I (High Sensitivity): 1581 ng/L (ref <18)

## 2024-09-13 LAB — CBC
HCT: 43 % (ref 39.0–52.0)
Hemoglobin: 14 g/dL (ref 13.0–17.0)
MCH: 32.1 pg (ref 26.0–34.0)
MCHC: 32.6 g/dL (ref 30.0–36.0)
MCV: 98.6 fL (ref 80.0–100.0)
Platelets: 154 K/uL (ref 150–400)
RBC: 4.36 MIL/uL (ref 4.22–5.81)
RDW: 14.7 % (ref 11.5–15.5)
WBC: 9.6 K/uL (ref 4.0–10.5)
nRBC: 0 % (ref 0.0–0.2)

## 2024-09-13 MED ORDER — ASPIRIN 81 MG PO TBEC
81.0000 mg | DELAYED_RELEASE_TABLET | Freq: Every day | ORAL | Status: DC
  Administered 2024-09-14: 81 mg via ORAL
  Filled 2024-09-13: qty 1

## 2024-09-13 MED ORDER — ALLOPURINOL 300 MG PO TABS
300.0000 mg | ORAL_TABLET | Freq: Every day | ORAL | Status: DC
  Administered 2024-09-14: 300 mg via ORAL
  Filled 2024-09-13: qty 1

## 2024-09-13 MED ORDER — LIDOCAINE VISCOUS HCL 2 % MT SOLN
15.0000 mL | Freq: Once | OROMUCOSAL | Status: AC
  Administered 2024-09-13: 15 mL via OROMUCOSAL
  Filled 2024-09-13: qty 15

## 2024-09-13 MED ORDER — AMLODIPINE BESYLATE 5 MG PO TABS
5.0000 mg | ORAL_TABLET | Freq: Every day | ORAL | Status: DC
  Administered 2024-09-14: 5 mg via ORAL
  Filled 2024-09-13: qty 1

## 2024-09-13 MED ORDER — ALUM & MAG HYDROXIDE-SIMETH 200-200-20 MG/5ML PO SUSP
30.0000 mL | Freq: Once | ORAL | Status: AC
  Administered 2024-09-13: 30 mL via ORAL
  Filled 2024-09-13: qty 30

## 2024-09-13 MED ORDER — ASPIRIN 81 MG PO CHEW
324.0000 mg | CHEWABLE_TABLET | ORAL | Status: DC
  Filled 2024-09-13: qty 4

## 2024-09-13 MED ORDER — IOHEXOL 350 MG/ML SOLN
85.0000 mL | Freq: Once | INTRAVENOUS | Status: AC | PRN
  Administered 2024-09-13: 85 mL via INTRAVENOUS

## 2024-09-13 MED ORDER — PREDNISONE 20 MG PO TABS
20.0000 mg | ORAL_TABLET | Freq: Every day | ORAL | Status: DC
  Administered 2024-09-14: 20 mg via ORAL
  Filled 2024-09-13: qty 1

## 2024-09-13 MED ORDER — PANTOPRAZOLE SODIUM 40 MG IV SOLR
40.0000 mg | Freq: Once | INTRAVENOUS | Status: AC
  Administered 2024-09-13: 40 mg via INTRAVENOUS
  Filled 2024-09-13: qty 10

## 2024-09-13 MED ORDER — ONDANSETRON HCL 4 MG/2ML IJ SOLN: 4.0000 mg | Freq: Four times a day (QID) | INTRAMUSCULAR | Status: DC | PRN

## 2024-09-13 MED ORDER — HEPARIN (PORCINE) 25000 UT/250ML-% IV SOLN
1100.0000 [IU]/h | INTRAVENOUS | Status: DC
  Administered 2024-09-13: 1100 [IU]/h via INTRAVENOUS
  Filled 2024-09-13: qty 250

## 2024-09-13 MED ORDER — MORPHINE SULFATE (PF) 4 MG/ML IV SOLN
4.0000 mg | Freq: Once | INTRAVENOUS | Status: AC
  Administered 2024-09-13: 4 mg via INTRAVENOUS
  Filled 2024-09-13: qty 1

## 2024-09-13 MED ORDER — ACETAMINOPHEN 325 MG PO TABS: 650.0000 mg | ORAL_TABLET | ORAL | Status: DC | PRN

## 2024-09-13 MED ORDER — ASPIRIN 300 MG RE SUPP: 300.0000 mg | RECTAL | Status: DC

## 2024-09-13 MED ORDER — ZOLPIDEM TARTRATE 5 MG PO TABS
5.0000 mg | ORAL_TABLET | Freq: Every evening | ORAL | Status: DC | PRN
  Administered 2024-09-13: 5 mg via ORAL
  Filled 2024-09-13: qty 1

## 2024-09-13 MED ORDER — NITROGLYCERIN 0.4 MG SL SUBL: 0.4000 mg | SUBLINGUAL_TABLET | SUBLINGUAL | Status: DC | PRN

## 2024-09-13 NOTE — ED Triage Notes (Signed)
 BIB [facility] From home. CP starting today at 10am sudden onset. Central, gas feeling. Relief with antacid. Previous Hx of MI. Pancreatic & Lung cancer. Chemo therapy. Pain 4/10. EKG unremarkable w/ EMS. 148/96 84HR 96% RA  20RR

## 2024-09-13 NOTE — ED Provider Notes (Signed)
 [place] EMERGENCY DEPARTMENT AT [facility] Provider Note   CSN: [id number] Arrival date & time: 09/13/24  1100     Patient presents with: No chief complaint on file.   Timothy Wilcox is a 83 y.o. male.   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 Pain began around 10 am this morning. Reports that the pain is located in his substernal region and feels like heartburn, however states that usually when he has heartburn it does not last this long. He states that his family member gave him antacids and his symptoms significantly improved. Reports that due to his lung cancer he does have some shortness of breath at baseline, however denies any worsening shortness of breath today. Denies fevers or chills. No nausea, vomiting, diarrhea, or abdominal pain. No worsening cough from baseline. Reports he last had chemo on 12/3 and is feeling weak from this but no more so than normal. Reports some trace amount of edema in his legs which again is baseline.   The history is provided by the patient. No language interpreter was used.       Prior to Admission medications   Medication Sig Start Date End Date Taking? Authorizing Provider  allopurinol  (ZYLOPRIM ) 300 MG tablet Take 1 tablet (300 mg total) by mouth daily. 03/31/24     amLODipine  (NORVASC ) 5 MG tablet Take 1 tablet (5 mg total) by mouth daily. 11/14/23   Wonda Sharper, MD  apixaban  (ELIQUIS ) 5 MG TABS tablet Take 1 tablet (5 mg total) by mouth 2 (two) times daily. 05/13/24   Cloretta Arley NOVAK, MD  aspirin  EC 81 MG tablet Take 81 mg by mouth daily.    [provider]  fluticasone  (FLONASE ) 50 MCG/ACT nasal spray Place 2 sprays into both nostrils See admin instructions. Instill 2 sprays into both nostrils in the morning and evening    [provider]  HYDROcodone -acetaminophen  (NORCO/VICODIN)  5-325 MG tablet Take 1 tablet by mouth every 6 (six) hours as needed for moderate pain (pain score 4-6). Patient not taking: Reported on 09/09/2024 12/31/23   Cloretta Arley NOVAK, MD  hydrocortisone 1 % ointment Apply 1 Application topically 2 (two) times daily as needed for itching. Patient not taking: Reported on 09/09/2024    [provider]  ibuprofen  (ADVIL ,MOTRIN ) 200 MG tablet Take 400 mg by mouth every 6 (six) hours as needed for mild pain (pain score 1-3) (lower back pain). Patient not taking: Reported on 09/09/2024    [provider]  ketoconazole (NIZORAL) 2 % shampoo Apply 1 Application topically See admin instructions. Use as a shampoo once a week Patient not taking: Reported on 09/09/2024    [provider]  Lactobacillus Rhamnosus, GG, (CULTURELLE) CAPS Take 1 capsule by mouth in the morning.    [provider]  lidocaine  (LIDODERM ) 5 % Place 1 patch onto the skin daily. May wear for up to 12 hours. 09/02/24     loratadine  (CLARITIN ) 10 MG tablet Take 10 mg by mouth See admin instructions. Take 10 mg by mouth in the morning and evening    [provider]  Menthol-Methyl Salicylate (SALONPAS PAIN RELIEF PATCH) PTCH Apply 1 patch topically daily as needed (for pain). Patient not taking: Reported on 09/09/2024    [provider]  Multiple Vitamin (MULTIVITAMIN WITH MINERALS) TABS Take 1 tablet by mouth daily with breakfast.    [provider]  nitroGLYCERIN  (NITROSTAT ) 0.4 MG SL tablet Place 1 tablet (0.4 mg total) under the tongue every 5 (five) minutes as needed for chest pain. X 3 doses Patient not taking: Reported on 09/09/2024 03/27/24   Cloretta Arley NOVAK, MD  predniSONE  (DELTASONE ) 20 MG tablet Take 1 tablet (20 mg total) by mouth daily with breakfast. 07/23/24   Debby Olam POUR, NP  PRESCRIPTION MEDICATION CPAP- At bedtime    [provider]  tacrolimus (PROTOPIC) 0.1 % ointment Apply 1 Application topically daily as  needed (for rashes- affected areas). Patient not taking: Reported on 09/09/2024    [provider]  Wheat Dextrin (BENEFIBER) POWD Take 4 g by mouth See admin instructions. Mix 4 grams (1 teaspoonful) into a desired beverage and drink in the morning    [provider]  zolpidem  (AMBIEN ) 10 MG tablet Take 1 tablet (10 mg total) by mouth at bedtime as needed. 07/30/24   Cloretta Arley NOVAK, MD    Allergies: Brilinta  [ticagrelor ], Plavix  [clopidogrel ], Statins, Clotrimazole-betamethasone, Potassium citrate, Tagamet [cimetidine], Tizanidine  hcl, Zyrtec [cetirizine hcl], Dicyclomine, and Zofran  [ondansetron ]    Review of Systems  Cardiovascular:  Positive for chest pain.  All other systems reviewed and are negative.   Updated Vital Signs BP 134/81   Pulse 81   Resp (!) 27   SpO2 100%   Physical Exam Vitals and nursing note reviewed.  Constitutional:      General: He is not in acute distress.    Appearance: Normal appearance. He is normal weight. He is not ill-appearing, toxic-appearing or diaphoretic.  HENT:     Head: Normocephalic and atraumatic.  Cardiovascular:     Rate and Rhythm: Normal rate and regular rhythm.     Heart sounds: Normal heart sounds.  Pulmonary:     Effort: Pulmonary effort is normal. No respiratory distress.     Breath sounds: Normal breath sounds.  Abdominal:     General: Abdomen is flat.     Palpations: Abdomen is soft.     Tenderness: There is no abdominal tenderness.  Musculoskeletal:        General: Normal range of motion.     Cervical back: Normal range of motion.     Comments: Trace edema BLE, no erythema, warmth, or tenderness  Skin:    General: Skin is warm and dry.  Neurological:     General: No focal deficit present.     Mental Status: He is alert and oriented to person, place, and time.     GCS: GCS eye subscore is 4. GCS verbal subscore is 5. GCS motor subscore is 6.     Sensory: Sensation is intact.     Motor: Motor function  is intact.     Coordination: Coordination is intact.     Gait: Gait is intact.  Psychiatric:        Mood and Affect: Mood normal.        Behavior: Behavior normal.     (all labs ordered are listed, but only abnormal results are displayed) Labs Reviewed  BASIC METABOLIC PANEL WITH GFR - Abnormal; Notable for the following components:      Result Value   Glucose, Bld 161 (*)    All other components within normal limits  TROPONIN I (HIGH SENSITIVITY) - Abnormal; Notable for the following components:   Troponin I (High Sensitivity) 84 (*)    All other components within normal limits  TROPONIN I (HIGH SENSITIVITY) - Abnormal; Notable for the following  components:   Troponin I (High Sensitivity) 238 (*)    All other components within normal limits  CBC  TROPONIN I (HIGH SENSITIVITY)    EKG: None  Radiology: CT Angio Chest PE W and/or Wo Contrast Result Date: 09/13/2024 CLINICAL DATA:  Chest pain, suspected pulmonary embolism, history of metastatic pancreatic cancer EXAM: CT ANGIOGRAPHY CHEST WITH CONTRAST TECHNIQUE: Multidetector CT imaging of the chest was performed using the standard protocol during bolus administration of intravenous contrast. Multiplanar CT image reconstructions and MIPs were obtained to evaluate the vascular anatomy. RADIATION DOSE REDUCTION: This exam was performed according to the departmental dose-optimization program which includes automated exposure control, adjustment of the mA and/or kV according to patient size and/or use of iterative reconstruction technique. CONTRAST:  85mL OMNIPAQUE  IOHEXOL  350 MG/ML SOLN COMPARISON:  07/29/2024, 09/13/2024 FINDINGS: Cardiovascular: This is a technically adequate evaluation of the pulmonary vasculature. There are no filling defects or pulmonary emboli. The heart is unremarkable without pericardial effusion. Calcifications of the mitral valve again noted. No evidence of thoracic aortic aneurysm or dissection. Atherosclerosis of  the aorta and coronary vasculature. Mediastinum/Nodes: No enlarged mediastinal, hilar, or axillary lymph nodes. Thyroid  gland, trachea, and esophagus demonstrate no significant findings. Lungs/Pleura: Numerous bilateral pulmonary nodules are again identified consistent with known metastases. Nodules have increased in size and number since prior study consistent with progression of disease. Index nodules are as follows: Left lower lobe, image 69/7, 2.9 x 1.3 cm.  Previously 2.5 x 1.3 cm. Right lower lobe, image 75/7, 3.9 x 1.9 cm. Previously 3.7 x 1.7 cm. No effusion or pneumothorax.  Central airways are patent. Upper Abdomen: Ill-defined cystic and solid mass within the head of the pancreas measuring up to 4.4 x 2.7 cm, compatible with known history of pancreatic cancer. Indeterminate 1.9 cm hypodensity right lobe liver image 144/5 may reflect metastatic disease. This is new since prior study. Musculoskeletal: There are no acute or destructive bony lesions. Stable right chest wall port. Reconstructed images demonstrate no additional findings. Review of the MIP images confirms the above findings. IMPRESSION: 1. No evidence of pulmonary embolus. 2. New and enlarging bilateral pulmonary nodules as above, consistent with progression of pulmonary metastases. 3. Complex cystic and solid pancreatic mass compatible with known pancreatic neoplasm. 4. New indeterminate 1.9 cm right lobe liver hypodensity, suspicious for hepatic metastasis. 5.  Aortic Atherosclerosis (ICD10-I70.0). Electronically Signed   By: Ozell Daring M.D.   On: 09/13/2024 16:21   DG Chest 2 View Result Date: 09/13/2024 EXAM: 2 VIEW(S) XRAY OF THE CHEST 09/13/2024 12:15:00 PM COMPARISON: 07/02/2024 CLINICAL HISTORY: CP FINDINGS: LINES, TUBES AND DEVICES: Right chest wall Port-A-Cath in place with tip overlying the mid SVC level. LUNGS AND PLEURA: Left lower lobe airspace opacity. Bilateral pulmonary nodules. Possible trace left pleural effusion. No  pneumothorax. HEART AND MEDIASTINUM: Aortic calcification. No acute abnormality of the cardiac and mediastinal silhouettes. BONES AND SOFT TISSUES: No acute osseous abnormality. IMPRESSION: 1. Left lower lobe airspace opacity, suspicious for atelectasis or pneumonia. 2. Bilateral pulmonary nodules similar to prior 3. Possible trace left pleural effusion. 4. Right chest wall Port-A-Cath with tip overlying the mid SVC. Electronically signed by: Camellia Candle MD 09/13/2024 12:46 PM EST RP Workstation: [id number]     .Critical Care  Performed by: Nora Lauraine LABOR, PA-C Authorized by: Luian Schumpert A, PA-C   Critical care provider statement:    Critical care time (minutes):  100   Critical care was necessary to treat or prevent imminent or life-threatening deterioration of  the following conditions:  Cardiac failure and circulatory failure   Critical care was time spent personally by me on the following activities:  Development of treatment plan with patient or surrogate, discussions with consultants, discussions with primary provider, evaluation of patient's response to treatment, examination of patient, obtaining history from patient or surrogate, ordering and review of laboratory studies, ordering and review of radiographic studies, pulse oximetry and re-evaluation of patient's condition   Care discussed with: admitting provider      Medications Ordered in the ED  alum & mag hydroxide-simeth (MAALOX/MYLANTA) 200-200-20 MG/5ML suspension 30 mL (has no administration in time range)  pantoprazole  (PROTONIX ) injection 40 mg (has no administration in time range)  lidocaine  (XYLOCAINE ) 2 % viscous mouth solution 15 mL (has no administration in time range)                                    Medical Decision Making Amount and/or Complexity of Data Reviewed Labs: ordered. Radiology: ordered.  Risk OTC drugs. Prescription drug management. Decision regarding hospitalization.   This patient is a 83  y.o. male who presents to the ED for concern of chest pain, this involves an extensive number of treatment options, and is a complaint that carries with it a high risk of complications and morbidity. The emergent differential diagnosis prior to evaluation includes, but is not limited to,  ACS, pericarditis, myocarditis, aortic dissection, PE, pneumothorax, esophageal rupture, pneumonia, reflux/PUD, biliary disease, pancreatitis, costochondritis, anxiety   This is not an exhaustive differential.   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  Additional history: Chart reviewed. Pertinent results include: patient had STEMI in 2018 s/p DES RCA.   Currently has pancreatic cancer with lung metastasis on chemotherapy  Physical Exam: Physical exam performed. The pertinent findings include: originally appears well, however upon reassessment more uncomfortable appearing. No other acute physical exam abnormalities  Lab Tests: I ordered, and personally interpreted labs.  The pertinent results include:  Troponin 84 --> 238   Imaging Studies: I ordered imaging studies including CTA PE. I independently visualized and interpreted imaging which showed   1. No evidence of pulmonary embolus. 2. New and enlarging bilateral pulmonary nodules as above, consistent with progression of pulmonary metastases. 3. Complex cystic and solid pancreatic mass compatible with known pancreatic neoplasm. 4. New indeterminate 1.9 cm right lobe liver hypodensity, suspicious for hepatic metastasis. 5.  Aortic Atherosclerosis  I agree with the radiologist interpretation.   Cardiac Monitoring:  The patient was maintained on a cardiac  monitor.  My attending physician Dr. Dean viewed and interpreted the cardiac monitored which showed an underlying rhythm of: sinus rhythm, no STEMI. I agree with this interpretation.   Medications: I ordered medication including GI cocktail, protonix , morphine   for pain. Reevaluation of the patient after these medicines showed that the patient improved. I have reviewed the patients home medicines and have made adjustments as needed.  Consultations Obtained: I requested consultation with the cardiology on call Dr. Delford,  and discussed lab and imaging findings as well as pertinent plan - they recommend: Dr. Delford reviewed the EKG and was concerned for ST elevation in inferior leads, original plan was to go to the cath  lab, however interventional cardiology Dr. Jordan felt NSTEMI more likely, and no indication for emergent cath. Plan for hospitalist admission.    Disposition: After consideration of the diagnostic results and the patients response to treatment, I feel that patient will require admission for NSTEMI. Discussed plan with patient who is understanding and in agreement.   My attending Dr. Dean spoke with hospitalist Dr. Seena who accepts patient for admission   This is a shared visit with supervising physician Dr. Dean who has independently evaluated patient & provided guidance in evaluation/management/disposition, in agreement with care   Final diagnoses:  NSTEMI (non-ST elevated myocardial infarction) [facility])    ED Discharge Orders     None          Nora Lauraine DELENA DEVONNA 09/13/24 2128    Dean Clarity, MD 09/13/24 2133

## 2024-09-13 NOTE — Progress Notes (Signed)
 ANTICOAGULATION CONSULT NOTE  Pharmacy Consult for Heparin  Indication: chest pain/ACS  Allergies  Allergen Reactions   Brilinta  [Ticagrelor ] Shortness Of Breath and Other (See Comments)    Dizziness, weakness/Caused hospital admission   Plavix  [Clopidogrel ] Shortness Of Breath and Other (See Comments)    Fatigue, also   Statins Other (See Comments)    Pain in joints, can't move knees   Clotrimazole-Betamethasone Other (See Comments)    Patient cannot recall symptoms   Potassium Citrate Diarrhea and Other (See Comments)    Severe stomach pain    Tagamet [Cimetidine] Other (See Comments)    Gynecomastia    Tizanidine  Hcl Other (See Comments)    Pt cannot recall symptoms   Zyrtec [Cetirizine Hcl] Other (See Comments)    Pt cannot recall symptoms   Dicyclomine Anxiety and Other (See Comments)    Interfered with sleep- made nervous   Zofran  [Ondansetron ] Other (See Comments)    Constipation     Patient Measurements:   Heparin  Dosing Weight: 87.4 kg  Vital Signs: Temp: 98.3 F (36.8 C) (12/07 1732) Temp Source: Oral (12/07 1732) BP: 116/81 (12/07 1800) Pulse Rate: 83 (12/07 1800)  Labs: Recent Labs    09/13/24 1112 09/13/24 1312  HGB 14.0  --   HCT 43.0  --   PLT 154  --   CREATININE 1.13  --   TROPONINIHS 84* 238*    Estimated Creatinine Clearance: 58.3 mL/min (by C-G formula based on SCr of 1.13 mg/dL).   Medical History: Past Medical History:  Diagnosis Date   Arthritis    CAD (coronary artery disease) 02/26/2018   A. Inf STEMI 6/18: LHC - pLCx 60, dRCA 100, EF 45-50 >> PCI:  DES to RCA // B. Echo 8/18: EF 60-65, nwm, grade 1 dd, MAC. C. 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   Hyperlipemia    intol to some statins   IBS (irritable bowel syndrome)    Medication intolerance    severe SOB due to Brilinta  >> changed to Plavix    Renal disorder    kidney stones   Seasonal allergies    Sinus congestion     chronic   Sleep apnea    uses a c-pap    Medications:  (Not in a hospital admission)  Scheduled:   [START ON 09/14/2024] allopurinol   300 mg Oral Daily   [START ON 09/14/2024] amLODipine   5 mg Oral Daily   aspirin   324 mg Oral NOW   Or   aspirin   300 mg Rectal NOW   [START ON 09/14/2024] aspirin  EC  81 mg Oral Daily   [START ON 09/14/2024] predniSONE   20 mg Oral Q breakfast   Infusions:  PRN: acetaminophen , nitroGLYCERIN , ondansetron  (ZOFRAN ) IV, zolpidem   Assessment: 63 yom with a history of CAD, pancreatic cancer, hx of PE on eliquis , CVA, HLD, gout. Patient is presenting with chest pain. Heparin  per pharmacy consult placed for chest pain/ACS.  Notably, STEMI activation paged and patient is currently likely to go to cath lab once a current emergency case is completed.  Patient is on apixaban  prior to arrival. Last dose 12/7 9am. Will require aPTT monitoring due to likely falsely high anti-Xa level secondary to DOAC use.  Hgb 14; plt 154  Goal of Therapy:  Heparin  level 0.3-0.7 units/ml aPTT 66-102 seconds Monitor platelets by anticoagulation protocol: Yes   Plan:  No initial heparin  bolus Start heparin  infusion at 1100  units/hr at 9pm ---adjust plan as needed for possible emergency heart cath Check aPTT & anti-Xa level in 8 hours and daily while on heparin  Continue to monitor via aPTT until levels are correlated Continue to monitor H&H and platelets  Dorn Buttner, PharmD, BCPS 09/13/2024 6:57 PM ED Clinical Pharmacist -  [phone]

## 2024-09-13 NOTE — H&P (Signed)
 History and Physical   MONTERRIO GERST FMW:[id number] DOB: 1940-11-09 DOA: 09/13/2024  PCP: Marvene Prentice SAUNDERS, FNP   Patient coming from: Home  Chief Complaint: Chest pain  HPI: Timothy Wilcox is a 83 y.o. male with medical history significant of hypertension, hyperlipidemia,  CVA, CAD status post stent x 2, pancreatic cancer s/p chemotherapy, PE on Eliquis  presenting with chest pain.  Pain started earlier today and described as substernal chest pain that is similar to his heartburn pain that he had in the past but has been lasting longer than heartburn.  Tried antacid at home without improvement.  Denies shortness of breath nor any worsening edema.  Further denies fevers, chills, abdominal pain, constipation, diarrhea, nausea, vomiting.  ED Course: Vital signs in the ED notable for blood pressure in the 130s-140 systolic, respiratory rate in the teens-20s.  Lab workup included BMP with glucose 161.  CBC within normal limits.  Troponin trend 84, 238.  Chest x-ray showed left lower lobe opacity consistent with atelectasis versus pneumonia with persistent lung nodules.  Also noted was trace pleural effusion and Port-A-Cath in place.  CTA PE study showed no evidence of PE but did show new increasing in size and number nodules, pancreatic mass which is known, new indeterminant lesion at the right lobe of the liver concerning for metastatic disease.  Received morphine  and PPI in the ED.  Cardiology consulted there is initially concern for STEMI but ultimately this was cleared by STEMI provider on-call and it was requested for medicine to admit with cardiology to consult.  Review of Systems: As per HPI otherwise all other systems reviewed and are negative.  Past Medical History:  Diagnosis Date   Arthritis    CAD (coronary artery disease) 02/26/2018   A. Inf STEMI 6/18: LHC - pLCx 60, dRCA 100, EF 45-50 >> PCI:  DES to RCA // B. Echo 8/18: EF 60-65, nwm, grade 1 dd, MAC. C. 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   Hyperlipemia    intol to some statins   IBS (irritable bowel syndrome)    Medication intolerance    severe SOB due to Brilinta  >> changed to Plavix    Renal disorder    kidney stones   Seasonal allergies    Sinus congestion    chronic   Sleep apnea    uses a c-pap    Past Surgical History:  Procedure Laterality Date   COLONOSCOPY     CORONARY STENT INTERVENTION N/A 04/03/2017   Procedure: Coronary Stent Intervention;  Surgeon: Wonda Sharper, MD;  Location[facility] INVASIVE CV LAB;  Service: Cardiovascular;  Laterality: N/A;   CORONARY STENT INTERVENTION N/A 02/27/2018   Procedure: CORONARY STENT INTERVENTION;  Surgeon: Burnard Debby LABOR, MD;  Location: [facility] INVASIVE CV LAB;  Service: Cardiovascular;  Laterality: N/A;   INCISION AND DRAINAGE  2011   infected finger   IR IMAGING GUIDED PORT INSERTION  01/15/2024   LEFT HEART CATH AND CORONARY ANGIOGRAPHY N/A 04/03/2017   Procedure: Left Heart Cath and Coronary Angiography;  Surgeon: Wonda Sharper, MD;  Location: [facility] INVASIVE CV LAB;  Service: Cardiovascular;  Laterality: N/A;   LEFT HEART CATH AND CORONARY ANGIOGRAPHY N/A 02/27/2018   Procedure: LEFT HEART CATH AND CORONARY ANGIOGRAPHY;  Surgeon: Burnard Debby LABOR, MD;  Location: [facility] INVASIVE CV LAB;  Service: Cardiovascular;  Laterality: N/A;   SHOULDER ARTHROSCOPY WITH ROTATOR CUFF REPAIR AND SUBACROMIAL DECOMPRESSION Right 01/29/2013  Procedure: RIGHT SHOULDER ARTHROSCOPY WITH SUBACROMIAL DECOMPRESSION, THREE TENDON ROTATOR CUFF REPAIR;  Surgeon: Lamar LULLA Leonor Mickey., MD;  Location: [facility];  Service: Orthopedics;  Laterality: Right;   TOE DEBRIDEMENT     rt toe cyst    Social History  reports that he has never smoked. He has never used smokeless tobacco. He reports current alcohol use. He reports that he does not use drugs.  Allergies  Allergen Reactions   Brilinta  [Ticagrelor ] Shortness Of  Breath and Other (See Comments)    Dizziness, weakness/Caused hospital admission   Plavix  [Clopidogrel ] Shortness Of Breath and Other (See Comments)    Fatigue, also   Statins Other (See Comments)    Pain in joints, can't move knees   Clotrimazole-Betamethasone Other (See Comments)    Patient cannot recall symptoms   Potassium Citrate Diarrhea and Other (See Comments)    Severe stomach pain    Tagamet [Cimetidine] Other (See Comments)    Gynecomastia    Tizanidine  Hcl Other (See Comments)    Pt cannot recall symptoms   Zyrtec [Cetirizine Hcl] Other (See Comments)    Pt cannot recall symptoms   Dicyclomine Anxiety and Other (See Comments)    Interfered with sleep- made nervous   Zofran  [Ondansetron ] Other (See Comments)    Constipation     Family History  Problem Relation Age of Onset   Skin cancer Mother    Pancreatic cancer Mother    Skin cancer Father   Reviewed on admission  Prior to Admission medications   Medication Sig Start Date End Date Taking? Authorizing Provider  allopurinol  (ZYLOPRIM ) 300 MG tablet Take 1 tablet (300 mg total) by mouth daily. 03/31/24  Yes   amLODipine  (NORVASC ) 5 MG tablet Take 1 tablet (5 mg total) by mouth daily. 11/14/23  Yes Wonda Sharper, MD  apixaban  (ELIQUIS ) 5 MG TABS tablet Take 1 tablet (5 mg total) by mouth 2 (two) times daily. 05/13/24  Yes Cloretta Arley NOVAK, MD  aspirin  EC 81 MG tablet Take 81 mg by mouth daily.   Yes [provider]  Docusate Calcium  (STOOL SOFTENER PO) Take 1 tablet by mouth daily.   Yes [provider]  fluticasone  (FLONASE ) 50 MCG/ACT nasal spray Place 2 sprays into both nostrils See admin instructions. Instill 2 sprays into both nostrils in the morning and evening   Yes [provider]  Lactobacillus Rhamnosus, GG, (CULTURELLE) CAPS Take 1 capsule by mouth in the morning.   Yes [provider]  loratadine  (CLARITIN ) 10 MG tablet Take 10 mg by mouth See admin instructions. Take 10 mg  by mouth in the morning and evening   Yes [provider]  Menthol-Methyl Salicylate (SALONPAS PAIN RELIEF PATCH) PTCH Apply 1 patch topically daily as needed (for pain).   Yes [provider]  Multiple Vitamin (MULTIVITAMIN WITH MINERALS) TABS Take 1 tablet by mouth daily with breakfast.   Yes [provider]  nitroGLYCERIN  (NITROSTAT ) 0.4 MG SL tablet Place 1 tablet (0.4 mg total) under the tongue every 5 (five) minutes as needed for chest pain. X 3 doses 03/27/24  Yes Cloretta Arley NOVAK, MD  predniSONE  (DELTASONE ) 20 MG tablet Take 1 tablet (20 mg total) by mouth daily with breakfast. 07/23/24  Yes Debby Olam POUR, NP  PRESCRIPTION MEDICATION CPAP- At bedtime   Yes [provider]  Wheat Dextrin (BENEFIBER) POWD Take 4 g by mouth See admin instructions. Mix 4 grams (1 teaspoonful) into a desired beverage and drink  in the morning   Yes [provider]  zolpidem  (AMBIEN ) 10 MG tablet Take 1 tablet (10 mg total) by mouth at bedtime as needed. 07/30/24  Yes Cloretta Arley NOVAK, MD  HYDROcodone -acetaminophen  (NORCO/VICODIN) 5-325 MG tablet Take 1 tablet by mouth every 6 (six) hours as needed for moderate pain (pain score 4-6). Patient not taking: No sig reported 12/31/23   Cloretta Arley NOVAK, MD  hydrocortisone 1 % ointment Apply 1 Application topically 2 (two) times daily as needed for itching. Patient not taking: No sig reported    [provider]  ibuprofen  (ADVIL ,MOTRIN ) 200 MG tablet Take 400 mg by mouth every 6 (six) hours as needed for mild pain (pain score 1-3) (lower back pain). Patient not taking: No sig reported    [provider]  ketoconazole (NIZORAL) 2 % shampoo Apply 1 Application topically See admin instructions. Use as a shampoo once a week Patient not taking: No sig reported    [provider]  lidocaine  (LIDODERM ) 5 % Place 1 patch onto the skin daily. May wear for up to 12 hours. Patient not taking: Reported on  09/13/2024 09/02/24     tacrolimus (PROTOPIC) 0.1 % ointment Apply 1 Application topically daily as needed (for rashes- affected areas). Patient not taking: No sig reported    [provider]    Physical Exam: Vitals:   09/13/24 1800 09/13/24 1830 09/13/24 1845 09/13/24 1857  BP: 116/81 (!) 121/57    Pulse: 83  77   Resp: (!) 25  20   Temp:      TempSrc:      SpO2: 97%  99%   Weight:    105.2 kg  Height:    5' 6 (1.676 m)    Physical Exam Constitutional:      General: He is not in acute distress.    Appearance: Normal appearance. He is obese.  HENT:     Head: Normocephalic and atraumatic.     Mouth/Throat:     Mouth: Mucous membranes are moist.     Pharynx: Oropharynx is clear.  Eyes:     Extraocular Movements: Extraocular movements intact.     Pupils: Pupils are equal, round, and reactive to light.  Cardiovascular:     Rate and Rhythm: Normal rate and regular rhythm.     Pulses: Normal pulses.     Heart sounds: Normal heart sounds.  Pulmonary:     Effort: Pulmonary effort is normal. No respiratory distress.     Breath sounds: Normal breath sounds.  Abdominal:     General: Bowel sounds are normal. There is no distension.     Palpations: Abdomen is soft.     Tenderness: There is no abdominal tenderness.  Musculoskeletal:        General: No swelling or deformity.  Skin:    General: Skin is warm and dry.  Neurological:     General: No focal deficit present.     Mental Status: Mental status is at baseline.    Labs on Admission: I have personally reviewed following labs and imaging studies  CBC: Recent Labs  Lab 09/09/24 0953 09/13/24 1112  WBC 8.5 9.6  NEUTROABS 5.9  --   HGB 13.1 14.0  HCT 39.1 43.0  MCV 97.5 98.6  PLT 218 154    Basic Metabolic Panel: Recent Labs  Lab 09/09/24 0953 09/13/24 1112  NA 140 142  K 3.9 4.0  CL 104 107  CO2 23 27  GLUCOSE 195* 161*  BUN 15 18  CREATININE 1.02 1.13  CALCIUM  9.7 9.0    GFR: Estimated  Creatinine Clearance: 58.3 mL/min (by C-G formula based on SCr of 1.13 mg/dL).  Liver Function Tests: Recent Labs  Lab 09/09/24 0953  AST 29  ALT 27  ALKPHOS 61  BILITOT 0.3  PROT 6.0*  ALBUMIN  3.7    Urine analysis:    Component Value Date/Time   COLORURINE AMBER (A) 12/01/2023 1217   APPEARANCEUR CLOUDY (A) 12/01/2023 1217   LABSPEC 1.017 12/01/2023 1217   PHURINE 6.0 12/01/2023 1217   GLUCOSEU NEGATIVE 12/01/2023 1217   HGBUR MODERATE (A) 12/01/2023 1217   BILIRUBINUR NEGATIVE 12/01/2023 1217   KETONESUR NEGATIVE 12/01/2023 1217   PROTEINUR 100 (A) 12/01/2023 1217   NITRITE NEGATIVE 12/01/2023 1217   LEUKOCYTESUR NEGATIVE 12/01/2023 1217    Radiological Exams on Admission: CT Angio Chest PE W and/or Wo Contrast Result Date: 09/13/2024 CLINICAL DATA:  Chest pain, suspected pulmonary embolism, history of metastatic pancreatic cancer EXAM: CT ANGIOGRAPHY CHEST WITH CONTRAST TECHNIQUE: Multidetector CT imaging of the chest was performed using the standard protocol during bolus administration of intravenous contrast. Multiplanar CT image reconstructions and MIPs were obtained to evaluate the vascular anatomy. RADIATION DOSE REDUCTION: This exam was performed according to the departmental dose-optimization program which includes automated exposure control, adjustment of the mA and/or kV according to patient size and/or use of iterative reconstruction technique. CONTRAST:  85mL OMNIPAQUE  IOHEXOL  350 MG/ML SOLN COMPARISON:  07/29/2024, 09/13/2024 FINDINGS: Cardiovascular: This is a technically adequate evaluation of the pulmonary vasculature. There are no filling defects or pulmonary emboli. The heart is unremarkable without pericardial effusion. Calcifications of the mitral valve again noted. No evidence of thoracic aortic aneurysm or dissection. Atherosclerosis of the aorta and coronary vasculature. Mediastinum/Nodes: No enlarged mediastinal, hilar, or axillary lymph nodes. Thyroid   gland, trachea, and esophagus demonstrate no significant findings. Lungs/Pleura: Numerous bilateral pulmonary nodules are again identified consistent with known metastases. Nodules have increased in size and number since prior study consistent with progression of disease. Index nodules are as follows: Left lower lobe, image 69/7, 2.9 x 1.3 cm.  Previously 2.5 x 1.3 cm. Right lower lobe, image 75/7, 3.9 x 1.9 cm. Previously 3.7 x 1.7 cm. No effusion or pneumothorax.  Central airways are patent. Upper Abdomen: Ill-defined cystic and solid mass within the head of the pancreas measuring up to 4.4 x 2.7 cm, compatible with known history of pancreatic cancer. Indeterminate 1.9 cm hypodensity right lobe liver image 144/5 may reflect metastatic disease. This is new since prior study. Musculoskeletal: There are no acute or destructive bony lesions. Stable right chest wall port. Reconstructed images demonstrate no additional findings. Review of the MIP images confirms the above findings. IMPRESSION: 1. No evidence of pulmonary embolus. 2. New and enlarging bilateral pulmonary nodules as above, consistent with progression of pulmonary metastases. 3. Complex cystic and solid pancreatic mass compatible with known pancreatic neoplasm. 4. New indeterminate 1.9 cm right lobe liver hypodensity, suspicious for hepatic metastasis. 5.  Aortic Atherosclerosis (ICD10-I70.0). Electronically Signed   By: Ozell Daring M.D.   On: 09/13/2024 16:21   DG Chest 2 View Result Date: 09/13/2024 EXAM: 2 VIEW(S) XRAY OF THE CHEST 09/13/2024 12:15:00 PM COMPARISON: 07/02/2024 CLINICAL HISTORY: CP FINDINGS: LINES, TUBES AND DEVICES: Right chest wall Port-A-Cath in place with tip overlying the mid SVC level. LUNGS AND PLEURA: Left lower lobe airspace opacity. Bilateral pulmonary nodules. Possible trace left pleural effusion. No pneumothorax. HEART AND MEDIASTINUM: Aortic calcification. No acute  abnormality of the cardiac and mediastinal  silhouettes. BONES AND SOFT TISSUES: No acute osseous abnormality. IMPRESSION: 1. Left lower lobe airspace opacity, suspicious for atelectasis or pneumonia. 2. Bilateral pulmonary nodules similar to prior 3. Possible trace left pleural effusion. 4. Right chest wall Port-A-Cath with tip overlying the mid SVC. Electronically signed by: Camellia Candle MD 09/13/2024 12:46 PM EST RP Workstation: [id number]   EKG: Independently reviewed.  Sinus rhythm at 83 beats minute.  ST depression in anterior lateral leads.  ST elevation in 2 and aVF.  Assessment/Plan Principal Problem:   NSTEMI (non-ST elevated myocardial infarction) (HCC) Active Problems:   CAD (coronary artery disease)   History of pulmonary embolus (PE)   Essential hypertension   Cancer of head of pancreas (HCC)   History of CVA (cerebrovascular accident)   NSTEMI CAD > Patient presenting with chest pain.  Found to have ST elevation that was borderline in lead II and aVF.  ST depression noted in anterior lateral leads. > Cardiology consulted and were considering STEMI versus NSTEMI and taken to Cath Lab today.  However interventional cardiologist further reviewed repeat EKGs and determine that this was not an NSTEMI and patient did not need to go to Cath Lab today. > History of CAD and prior stenting x 2.  Not currently on statin nor antiplatelet (other than ASA) due to history of intolerance. - Monitor on progressive unit - Appreciate cardiology recommendations assistance - Heparin  infusion - ASA - Trend troponin - Echocardiogram - A.m. lipid panel, A1c, LPA - Supportive care  Hypertension - Continue home amlodipine   Hyperlipidemia - Not currently on statin due to intolerance  Pancreatic cancer - Status post immunotherapy - Follows with oncology  History of PE > On Eliquis  at home - Heparin  infusion for now as above  DVT prophylaxis: Heparin  Code Status:   DNR/DNI Family Communication:  None on admission  Disposition  Plan:   Patient is from:  Home  Anticipated DC to:  Home  Anticipated DC date:  2 to 4 days  Anticipated DC barriers: None  Consults called:  Cardiology Admission status:  Inpatient, progressive  Severity of Illness: The appropriate patient status for this patient is INPATIENT. Inpatient status is judged to be reasonable and necessary in order to provide the required intensity of service to ensure the patient's safety. The patient's presenting symptoms, physical exam findings, and initial radiographic and laboratory data in the context of their chronic comorbidities is felt to place them at high risk for further clinical deterioration. Furthermore, it is not anticipated that the patient will be medically stable for discharge from the hospital within 2 midnights of admission.   * I certify that at the point of admission it is my clinical judgment that the patient will require inpatient hospital care spanning beyond 2 midnights from the point of admission due to high intensity of service, high risk for further deterioration and high frequency of surveillance required.DEWAINE Marsa KATHEE Seena MD [facility]  How to contact the [facility] Attending or Consulting provider 7A - 7P or covering provider during after hours 7P -7A, for this patient?   Check the care team in [facility] and look for a) attending/consulting [facility] provider listed and b) the [facility] team listed Log into [email or web address] and use [place]'s universal password to access. If you do not have the password, please contact the hospital operator. Locate the [facility] provider you are looking for under [facility] and page to a number that you can be  directly reached. If you still have difficulty reaching the provider, please page the [facility] (Director on Call) for the Hospitalists listed on amion for assistance.  09/13/2024, 7:02 PM

## 2024-09-13 NOTE — Consult Note (Signed)
 CARDIOLOGY CONSULT NOTE       Patient ID: Timothy Wilcox MRN: [id number] DOB/AGE: 1941-06-14 83 y.o.  Admit date: 09/13/2024 Referring Physician: Nora ER PA Primary Physician: Cloretta Arley NOVAK, MD Primary Cardiologist: Wonda Reason for Consultation: MI  Active Problems:   * No active hospital problems. *   HPI:  83 y.o. been in ER for 5 hours. Has had SSCP since 10:00 am. History of distant stent to the distal RCA Most recent cath 2019 with stent to the mid LCX. His initial ECG shows and inferior MI with ST elevation in 2,3,F Troponin has gone from 84 to 238. He is still having chest pain. History of PE/DVT. Took his last dose of eliquis  this am. He last ate 1/2 sandwich 2 hours ago. 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 He is getting active Rx with gemcitabine /Abraxane . He has some chronic dyspnea from his His Hct is stable at 43 and PLT 154 Cr normal 1.13  Discussed benefit of acute cath despite cancer. He is willing to do.   Shared Decision Making/Informed Consent The risks [stroke (1 in 1000), death (1 in 1000), kidney failure [usually temporary] (1 in 500), bleeding (1 in 200), allergic reaction [possibly serious] (1 in 200)], benefits (diagnostic support and management of coronary artery disease) and alternatives of a cardiac catheterization were discussed in detail with Ms. Sammye and she is willing to proceed.   Have discussed with Dr Jordan STEMI doctor He is finishing another acute and will take patient to lab following that   ROS All other systems reviewed and negative except as noted above  Past Medical History:  Diagnosis Date   Arthritis    CAD (coronary artery disease) 02/26/2018   A. Inf STEMI 6/18: LHC - pLCx 60, dRCA 100, EF 45-50 >> PCI:  DES to RCA // B. Echo 8/18: EF 60-65, nwm, grade 1 dd, MAC. C. 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   Hyperlipemia    intol to some statins    IBS (irritable bowel syndrome)    Medication intolerance    severe SOB due to Brilinta  >> changed to Plavix    Renal disorder    kidney stones   Seasonal allergies    Sinus congestion    chronic   Sleep apnea    uses a c-pap    Family History  Problem Relation Age of Onset   Skin cancer Mother    Pancreatic cancer Mother    Skin cancer Father     Social History   Socioeconomic History   Marital status: Married    Spouse name: Not on file   Number of children: Not on file   Years of education: Not on file   Highest education level: Not on file  Occupational History   Not on file  Tobacco Use   Smoking status: Never   Smokeless tobacco: Never  Vaping Use   Vaping status: Never Used  Substance and Sexual Activity   Alcohol use: Yes    Comment: a bottle of wine every night   Drug use: No   Sexual activity: Not on file  Other Topics Concern   Not on file  Social History Narrative   Not on file   Social Drivers of Health   Financial Resource Strain: Not on file  Food Insecurity: No Food Insecurity (12/31/2023)   Hunger Vital Sign  Worried About Programme Researcher, Broadcasting/film/video in the Last Year: Never true    Ran Out of Food in the Last Year: Never true  Transportation Needs: No Transportation Needs (12/31/2023)   PRAPARE - Administrator, Civil Service (Medical): No    Lack of Transportation (Non-Medical): No  Physical Activity: Not on file  Stress: Not on file  Social Connections: Moderately Integrated (12/09/2023)   Social Connection and Isolation Panel    Frequency of Communication with Friends and Family: More than three times a week    Frequency of Social Gatherings with Friends and Family: Once a week    Attends Religious Services: Never    Database Administrator or Organizations: Yes    Attends Engineer, Structural: More than 4 times per year    Marital Status: Married  Catering Manager Violence: Not At Risk (12/31/2023)   Humiliation, Afraid, Rape,  and Kick questionnaire    Fear of Current or Ex-Partner: No    Emotionally Abused: No    Physically Abused: No    Sexually Abused: No    Past Surgical History:  Procedure Laterality Date   COLONOSCOPY     CORONARY STENT INTERVENTION N/A 04/03/2017   Procedure: Coronary Stent Intervention;  Surgeon: Wonda Sharper, MD;  Location: [facility] INVASIVE CV LAB;  Service: Cardiovascular;  Laterality: N/A;   CORONARY STENT INTERVENTION N/A 02/27/2018   Procedure: CORONARY STENT INTERVENTION;  Surgeon: Burnard Debby LABOR, MD;  Location: [facility] INVASIVE CV LAB;  Service: Cardiovascular;  Laterality: N/A;   INCISION AND DRAINAGE  2011   infected finger   IR IMAGING GUIDED PORT INSERTION  01/15/2024   LEFT HEART CATH AND CORONARY ANGIOGRAPHY N/A 04/03/2017   Procedure: Left Heart Cath and Coronary Angiography;  Surgeon: Wonda Sharper, MD;  Location: [facility] INVASIVE CV LAB;  Service: Cardiovascular;  Laterality: N/A;   LEFT HEART CATH AND CORONARY ANGIOGRAPHY N/A 02/27/2018   Procedure: LEFT HEART CATH AND CORONARY ANGIOGRAPHY;  Surgeon: Burnard Debby LABOR, MD;  Location: [facility] INVASIVE CV LAB;  Service: Cardiovascular;  Laterality: N/A;   SHOULDER ARTHROSCOPY WITH ROTATOR CUFF REPAIR AND SUBACROMIAL DECOMPRESSION Right 01/29/2013   Procedure: RIGHT SHOULDER ARTHROSCOPY WITH SUBACROMIAL DECOMPRESSION, THREE TENDON ROTATOR CUFF REPAIR;  Surgeon: Lamar LULLA Leonor Mickey., MD;  Location: [facility];  Service: Orthopedics;  Laterality: Right;   TOE DEBRIDEMENT     rt toe cyst     No current facility-administered medications for this encounter.  Current Outpatient Medications:    allopurinol  (ZYLOPRIM ) 300 MG tablet, Take 1 tablet (300 mg total) by mouth daily., Disp: 90 tablet, Rfl: 1   amLODipine  (NORVASC ) 5 MG tablet, Take 1 tablet (5 mg total) by mouth daily., Disp: 90 tablet, Rfl: 3   apixaban  (ELIQUIS ) 5 MG TABS tablet, Take 1 tablet (5 mg total) by mouth 2 (two) times daily., Disp: 60 tablet, Rfl: 5   aspirin  EC  81 MG tablet, Take 81 mg by mouth daily., Disp: , Rfl:    fluticasone  (FLONASE ) 50 MCG/ACT nasal spray, Place 2 sprays into both nostrils See admin instructions. Instill 2 sprays into both nostrils in the morning and evening, Disp: , Rfl:    HYDROcodone -acetaminophen  (NORCO/VICODIN) 5-325 MG tablet, Take 1 tablet by mouth every 6 (six) hours as needed for moderate pain (pain score 4-6). (Patient not taking: Reported on 09/09/2024), Disp: 30 tablet, Rfl: 0   hydrocortisone 1 % ointment, Apply 1 Application topically 2 (two) times daily as needed for itching. (  Patient not taking: Reported on 09/09/2024), Disp: , Rfl:    ibuprofen  (ADVIL ,MOTRIN ) 200 MG tablet, Take 400 mg by mouth every 6 (six) hours as needed for mild pain (pain score 1-3) (lower back pain). (Patient not taking: Reported on 09/09/2024), Disp: , Rfl:    ketoconazole (NIZORAL) 2 % shampoo, Apply 1 Application topically See admin instructions. Use as a shampoo once a week (Patient not taking: Reported on 09/09/2024), Disp: , Rfl:    Lactobacillus Rhamnosus, GG, (CULTURELLE) CAPS, Take 1 capsule by mouth in the morning., Disp: , Rfl:    lidocaine  (LIDODERM ) 5 %, Place 1 patch onto the skin daily. May wear for up to 12 hours., Disp: 30 patch, Rfl: 0   loratadine  (CLARITIN ) 10 MG tablet, Take 10 mg by mouth See admin instructions. Take 10 mg by mouth in the morning and evening, Disp: , Rfl:    Menthol-Methyl Salicylate (SALONPAS PAIN RELIEF PATCH) PTCH, Apply 1 patch topically daily as needed (for pain). (Patient not taking: Reported on 09/09/2024), Disp: , Rfl:    Multiple Vitamin (MULTIVITAMIN WITH MINERALS) TABS, Take 1 tablet by mouth daily with breakfast., Disp: , Rfl:    nitroGLYCERIN  (NITROSTAT ) 0.4 MG SL tablet, Place 1 tablet (0.4 mg total) under the tongue every 5 (five) minutes as needed for chest pain. X 3 doses (Patient not taking: Reported on 09/09/2024), Disp: 25 tablet, Rfl: 0   predniSONE  (DELTASONE ) 20 MG tablet, Take 1 tablet (20 mg  total) by mouth daily with breakfast., Disp: 90 tablet, Rfl: 0   PRESCRIPTION MEDICATION, CPAP- At bedtime, Disp: , Rfl:    tacrolimus (PROTOPIC) 0.1 % ointment, Apply 1 Application topically daily as needed (for rashes- affected areas). (Patient not taking: Reported on 09/09/2024), Disp: , Rfl:    Wheat Dextrin (BENEFIBER) POWD, Take 4 g by mouth See admin instructions. Mix 4 grams (1 teaspoonful) into a desired beverage and drink in the morning, Disp: , Rfl:    zolpidem  (AMBIEN ) 10 MG tablet, Take 1 tablet (10 mg total) by mouth at bedtime as needed., Disp: 30 tablet, Rfl: 1    Physical Exam: Blood pressure (!) 148/83, pulse 82, temperature 98.3 F (36.8 C), temperature source Oral, resp. rate 18, SpO2 98%.    Obese male Mild tachypnea Decreased BS base No murmur Abdomen benign Plus one edema Good right radial pulse   Labs:   Lab Results  Component Value Date   WBC 9.6 09/13/2024   HGB 14.0 09/13/2024   HCT 43.0 09/13/2024   MCV 98.6 09/13/2024   PLT 154 09/13/2024    Recent Labs  Lab 09/09/24 0953 09/13/24 1112  NA 140 142  K 3.9 4.0  CL 104 107  CO2 23 27  BUN 15 18  CREATININE 1.02 1.13  CALCIUM  9.7 9.0  PROT 6.0*  --   BILITOT 0.3  --   ALKPHOS 61  --   ALT 27  --   AST 29  --   GLUCOSE 195* 161*   Lab Results  Component Value Date   CKTOTAL 43 (L) 12/01/2023   TROPONINI <0.03 03/19/2018    Lab Results  Component Value Date   CHOL 122 10/26/2020   CHOL 148 02/27/2018   CHOL 165 07/15/2017   Lab Results  Component Value Date   HDL 66 10/26/2020   HDL 45 02/27/2018   HDL 63 07/15/2017   Lab Results  Component Value Date   LDLCALC 35 10/26/2020   LDLCALC 61 02/27/2018   LDLCALC 55  07/15/2017   Lab Results  Component Value Date   TRIG 124 10/26/2020   TRIG 208 (H) 02/27/2018   TRIG 233 (H) 07/15/2017   Lab Results  Component Value Date   CHOLHDL 1.8 10/26/2020   CHOLHDL 3.3 02/27/2018   CHOLHDL 2.6 07/15/2017   No results found for:  LDLDIRECT    Radiology: CT Angio Chest PE W and/or Wo Contrast Result Date: 09/13/2024 CLINICAL DATA:  Chest pain, suspected pulmonary embolism, history of metastatic pancreatic cancer EXAM: CT ANGIOGRAPHY CHEST WITH CONTRAST TECHNIQUE: Multidetector CT imaging of the chest was performed using the standard protocol during bolus administration of intravenous contrast. Multiplanar CT image reconstructions and MIPs were obtained to evaluate the vascular anatomy. RADIATION DOSE REDUCTION: This exam was performed according to the departmental dose-optimization program which includes automated exposure control, adjustment of the mA and/or kV according to patient size and/or use of iterative reconstruction technique. CONTRAST:  85mL OMNIPAQUE  IOHEXOL  350 MG/ML SOLN COMPARISON:  07/29/2024, 09/13/2024 FINDINGS: Cardiovascular: This is a technically adequate evaluation of the pulmonary vasculature. There are no filling defects or pulmonary emboli. The heart is unremarkable without pericardial effusion. Calcifications of the mitral valve again noted. No evidence of thoracic aortic aneurysm or dissection. Atherosclerosis of the aorta and coronary vasculature. Mediastinum/Nodes: No enlarged mediastinal, hilar, or axillary lymph nodes. Thyroid  gland, trachea, and esophagus demonstrate no significant findings. Lungs/Pleura: Numerous bilateral pulmonary nodules are again identified consistent with known metastases. Nodules have increased in size and number since prior study consistent with progression of disease. Index nodules are as follows: Left lower lobe, image 69/7, 2.9 x 1.3 cm.  Previously 2.5 x 1.3 cm. Right lower lobe, image 75/7, 3.9 x 1.9 cm. Previously 3.7 x 1.7 cm. No effusion or pneumothorax.  Central airways are patent. Upper Abdomen: Ill-defined cystic and solid mass within the head of the pancreas measuring up to 4.4 x 2.7 cm, compatible with known history of pancreatic cancer. Indeterminate 1.9 cm  hypodensity right lobe liver image 144/5 may reflect metastatic disease. This is new since prior study. Musculoskeletal: There are no acute or destructive bony lesions. Stable right chest wall port. Reconstructed images demonstrate no additional findings. Review of the MIP images confirms the above findings. IMPRESSION: 1. No evidence of pulmonary embolus. 2. New and enlarging bilateral pulmonary nodules as above, consistent with progression of pulmonary metastases. 3. Complex cystic and solid pancreatic mass compatible with known pancreatic neoplasm. 4. New indeterminate 1.9 cm right lobe liver hypodensity, suspicious for hepatic metastasis. 5.  Aortic Atherosclerosis (ICD10-I70.0). Electronically Signed   By: Ozell Daring M.D.   On: 09/13/2024 16:21   DG Chest 2 View Result Date: 09/13/2024 EXAM: 2 VIEW(S) XRAY OF THE CHEST 09/13/2024 12:15:00 PM COMPARISON: 07/02/2024 CLINICAL HISTORY: CP FINDINGS: LINES, TUBES AND DEVICES: Right chest wall Port-A-Cath in place with tip overlying the mid SVC level. LUNGS AND PLEURA: Left lower lobe airspace opacity. Bilateral pulmonary nodules. Possible trace left pleural effusion. No pneumothorax. HEART AND MEDIASTINUM: Aortic calcification. No acute abnormality of the cardiac and mediastinal silhouettes. BONES AND SOFT TISSUES: No acute osseous abnormality. IMPRESSION: 1. Left lower lobe airspace opacity, suspicious for atelectasis or pneumonia. 2. Bilateral pulmonary nodules similar to prior 3. Possible trace left pleural effusion. 4. Right chest wall Port-A-Cath with tip overlying the mid SVC. Electronically signed by: Camellia Candle MD 09/13/2024 12:46 PM EST RP Workstation: [id number]    EKG: 11:00 acute ST elevation in 2,84F with IMI repeat not in Epic yet with less ST elevation  only in 3   ASSESSMENT AND PLAN:   IMI:  although 2nd ECG is improved and patient has metastatic cancer he should go to lab. He has history of distal RCA stent and more recently in 2019  he had mid LCX stent. He has tolerated eliquis  for DVT/PE  and not had bleeding issues. Consent obtained and lab aware to call for him wihen done with current acute MI He is hemodynamically stable with no arrhythmias or AV block DVT/PE no recurrence on CTA done in ER this visit. Last does of eliquis  this am. Dr Jordan will need to weigh in on monoRx or short term 30 day DAT with cath PE and DVT where 11/29/2023 ? 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   Given cancer and co morbidities admit to medicine although if he goes to lab will be seen in CCU by CCM/CHF   Signed: Maude Emmer 09/13/2024, 4:59 PM

## 2024-09-13 NOTE — Consult Note (Incomplete)
 Cardiology Consultation   Patient ID: Timothy Wilcox MRN: [id number]; DOB: Nov 06, 1940  Admit date: 09/13/2024 Date of Consult: 09/13/2024  PCP:  Cloretta Arley NOVAK, MD   [facility] Providers Cardiologist:  Ozell Fell, MD   { Click here to update MD or APP on Care Team, Refresh:1}     Patient Profile: Timothy Wilcox is a 83 y.o. male with a hx of CAD, pancreatic cancer, history of PE, history of CVA, hyperlipidemia, gout, OSA on CPAP who is being seen 09/13/2024 for the evaluation of NSTEMI at the request of Dr. Dean.  History of Present Illness: [name] is an 83 year old male with above medical history.  Patient previously had STEMI in 2018 and received DES to RCA.  He did not tolerate Brilinta  due to shortness of breath, transition to clopidogrel .  In 2019 he had progressive shortness of breath on exertion.  Underwent cardiac catheterization 02/27/2018 that showed previously placed distal RCA stent was patent.  There was 80% stenosis in proximal left circumflex.  This was treated with successful PCI with DES.  Echocardiogram at that time showed EF 55 to 60%, no regional wall motion abnormalities, grade 1 DD.   Patient's most recent ischemic evaluation was a stress test in 07/2023 that showed no evidence of ischemia or infarction.  EF estimated at 64%.  Notes recent echo from 11/2023 showed EF 60-65%, no wall motion abnormalities, grade 1 DD, normal RV systolic function, no significant valvular abnormalities.  In 11/2023, patient had a PE.  Started on Eliquis .  Patient has pancreatic cancer.  Completed 1 cycle of FOLFOX, cycle 2 was held due to poor tolerance (fatigue, numbness).   Patient presented to the ED on 12/7 complaining of chest pain that started at 10 AM.  Pain was sudden onset.  Felt like gas.  He did have some relief with antacids.  Initial vital signs showed BP 130/81.  Oxygen 100% on room air, heart rate 81 bpm.  BMP grossly within normal limits.   CBC normal.  High-sensitivity troponin 84> 238.  CTA chest showed no evidence of PE.  There were new and enlarging bilateral pulmonary nodules consistent with progression of pulmonary metastases.  There was complex cystic and solid pancreatic mass consistent patible with known pancreatic neoplasm.  Cardiology consulted for STEMI   Past Medical History:  Diagnosis Date   Arthritis    CAD (coronary artery disease) 02/26/2018   A. Inf STEMI 6/18: LHC - pLCx 60, dRCA 100, EF 45-50 >> PCI:  DES to RCA // B. Echo 8/18: EF 60-65, nwm, grade 1 dd, MAC. C. 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   Hyperlipemia    intol to some statins   IBS (irritable bowel syndrome)    Medication intolerance    severe SOB due to Brilinta  >> changed to Plavix    Renal disorder    kidney stones   Seasonal allergies    Sinus congestion    chronic   Sleep apnea    uses a c-pap    Past Surgical History:  Procedure Laterality Date   COLONOSCOPY     CORONARY STENT INTERVENTION N/A 04/03/2017   Procedure: Coronary Stent Intervention;  Surgeon: Fell Ozell, MD;  Location: [facility] INVASIVE CV LAB;  Service: Cardiovascular;  Laterality: N/A;   CORONARY STENT INTERVENTION N/A 02/27/2018   Procedure: CORONARY STENT INTERVENTION;  Surgeon: Burnard Debby LABOR, MD;  Location: [facility] INVASIVE CV  LAB;  Service: Cardiovascular;  Laterality: N/A;   INCISION AND DRAINAGE  2011   infected finger   IR IMAGING GUIDED PORT INSERTION  01/15/2024   LEFT HEART CATH AND CORONARY ANGIOGRAPHY N/A 04/03/2017   Procedure: Left Heart Cath and Coronary Angiography;  Surgeon: Wonda Sharper, MD;  Location: [facility] INVASIVE CV LAB;  Service: Cardiovascular;  Laterality: N/A;   LEFT HEART CATH AND CORONARY ANGIOGRAPHY N/A 02/27/2018   Procedure: LEFT HEART CATH AND CORONARY ANGIOGRAPHY;  Surgeon: Burnard Debby LABOR, MD;  Location: [facility] INVASIVE CV LAB;  Service: Cardiovascular;  Laterality: N/A;   SHOULDER  ARTHROSCOPY WITH ROTATOR CUFF REPAIR AND SUBACROMIAL DECOMPRESSION Right 01/29/2013   Procedure: RIGHT SHOULDER ARTHROSCOPY WITH SUBACROMIAL DECOMPRESSION, THREE TENDON ROTATOR CUFF REPAIR;  Surgeon: Lamar LULLA Leonor Mickey., MD;  Location: [facility];  Service: Orthopedics;  Laterality: Right;   TOE DEBRIDEMENT     rt toe cyst    Scheduled Meds:  Continuous Infusions:  PRN Meds:   Allergies:    Allergies  Allergen Reactions   Brilinta  [Ticagrelor ] Shortness Of Breath and Other (See Comments)    Dizziness, weakness/Caused hospital admission   Plavix  [Clopidogrel ] Shortness Of Breath and Other (See Comments)    Fatigue, also   Statins Other (See Comments)    Pain in joints, can't move knees   Clotrimazole-Betamethasone Other (See Comments)    Patient cannot recall symptoms   Potassium Citrate Diarrhea and Other (See Comments)    Severe stomach pain    Tagamet [Cimetidine] Other (See Comments)    Gynecomastia    Tizanidine  Hcl Other (See Comments)    Pt cannot recall symptoms   Zyrtec [Cetirizine Hcl] Other (See Comments)    Pt cannot recall symptoms   Dicyclomine Anxiety and Other (See Comments)    Interfered with sleep- made nervous   Zofran  [Ondansetron ] Other (See Comments)    Constipation     Social History:   Social History   Socioeconomic History   Marital status: Married    Spouse name: Not on file   Number of children: Not on file   Years of education: Not on file   Highest education level: Not on file  Occupational History   Not on file  Tobacco Use   Smoking status: Never   Smokeless tobacco: Never  Vaping Use   Vaping status: Never Used  Substance and Sexual Activity   Alcohol use: Yes    Comment: a bottle of wine every night   Drug use: No   Sexual activity: Not on file  Other Topics Concern   Not on file  Social History Narrative   Not on file   Social Drivers of Health   Financial Resource Strain: Not on file  Food Insecurity:  No Food Insecurity (12/31/2023)   Hunger Vital Sign    Worried About Running Out of Food in the Last Year: Never true    Ran Out of Food in the Last Year: Never true  Transportation Needs: No Transportation Needs (12/31/2023)   PRAPARE - Administrator, Civil Service (Medical): No    Lack of Transportation (Non-Medical): No  Physical Activity: Not on file  Stress: Not on file  Social Connections: Moderately Integrated (12/09/2023)   Social Connection and Isolation Panel    Frequency of Communication with Friends and Family: More than three times a week    Frequency of Social Gatherings with Friends and Family: Once a week    Attends Religious Services: Never  Active Member of Clubs or Organizations: Yes    Attends Banker Meetings: More than 4 times per year    Marital Status: Married  Catering Manager Violence: Not At Risk (12/31/2023)   Humiliation, Afraid, Rape, and Kick questionnaire    Fear of Current or Ex-Partner: No    Emotionally Abused: No    Physically Abused: No    Sexually Abused: No    Family History:   *** Family History  Problem Relation Age of Onset   Skin cancer Mother    Pancreatic cancer Mother    Skin cancer Father      ROS:  Please see the history of present illness.  *** All other ROS reviewed and negative.     Physical Exam/Data: Vitals:   09/13/24 1111 09/13/24 1236 09/13/24 1400 09/13/24 1410  BP: 134/81   (!) 148/83  Pulse: 81  81 82  Resp: (!) 27  15 18   Temp:  98.3 F (36.8 C)    TempSrc:  Oral    SpO2: 100%  98% 98%   No intake or output data in the 24 hours ending 09/13/24 1652    09/09/2024   10:15 AM 09/01/2024    1:47 PM 08/19/2024   11:37 AM  Last 3 Weights  Weight (lbs) 232 lb 233 lb 8 oz 232 lb 6.4 oz  Weight (kg) 105.235 kg 105.915 kg 105.416 kg     There is no height or weight on file to calculate BMI.  General:  Well nourished, well developed, in no acute distress*** HEENT: normal Neck: no  JVD Vascular: No carotid bruits; Distal pulses 2+ bilaterally Cardiac:  normal S1, S2; RRR; no murmur *** Lungs:  clear to auscultation bilaterally, no wheezing, rhonchi or rales  Abd: soft, nontender, no hepatomegaly  Ext: no edema Musculoskeletal:  No deformities, BUE and BLE strength normal and equal Skin: warm and dry  Neuro:  CNs 2-12 intact, no focal abnormalities noted Psych:  Normal affect   EKG:  The EKG was personally reviewed and demonstrates:  *** Telemetry:  Telemetry was personally reviewed and demonstrates:  ***  Relevant CV Studies: ***  Laboratory Data: High Sensitivity Troponin:   Recent Labs  Lab 09/13/24 1112 09/13/24 1312  TROPONINIHS 84* 238*     Chemistry Recent Labs  Lab 09/09/24 0953 09/13/24 1112  NA 140 142  K 3.9 4.0  CL 104 107  CO2 23 27  GLUCOSE 195* 161*  BUN 15 18  CREATININE 1.02 1.13  CALCIUM  9.7 9.0  GFRNONAA >60 >60  ANIONGAP 12 8    Recent Labs  Lab 09/09/24 0953  PROT 6.0*  ALBUMIN  3.7  AST 29  ALT 27  ALKPHOS 61  BILITOT 0.3   Lipids No results for input(s): CHOL, TRIG, HDL, LABVLDL, LDLCALC, CHOLHDL in the last 168 hours.  Hematology Recent Labs  Lab 09/09/24 0953 09/13/24 1112  WBC 8.5 9.6  RBC 4.01* 4.36  HGB 13.1 14.0  HCT 39.1 43.0  MCV 97.5 98.6  MCH 32.7 32.1  MCHC 33.5 32.6  RDW 15.1 14.7  PLT 218 154   Thyroid  No results for input(s): TSH, FREET4 in the last 168 hours.  BNPNo results for input(s): BNP, PROBNP in the last 168 hours.  DDimer No results for input(s): DDIMER in the last 168 hours.  Radiology/Studies:  CT Angio Chest PE W and/or Wo Contrast Result Date: 09/13/2024 CLINICAL DATA:  Chest pain, suspected pulmonary embolism, history of metastatic pancreatic cancer EXAM: CT  ANGIOGRAPHY CHEST WITH CONTRAST TECHNIQUE: Multidetector CT imaging of the chest was performed using the standard protocol during bolus administration of intravenous contrast. Multiplanar CT image  reconstructions and MIPs were obtained to evaluate the vascular anatomy. RADIATION DOSE REDUCTION: This exam was performed according to the departmental dose-optimization program which includes automated exposure control, adjustment of the mA and/or kV according to patient size and/or use of iterative reconstruction technique. CONTRAST:  85mL OMNIPAQUE  IOHEXOL  350 MG/ML SOLN COMPARISON:  07/29/2024, 09/13/2024 FINDINGS: Cardiovascular: This is a technically adequate evaluation of the pulmonary vasculature. There are no filling defects or pulmonary emboli. The heart is unremarkable without pericardial effusion. Calcifications of the mitral valve again noted. No evidence of thoracic aortic aneurysm or dissection. Atherosclerosis of the aorta and coronary vasculature. Mediastinum/Nodes: No enlarged mediastinal, hilar, or axillary lymph nodes. Thyroid  gland, trachea, and esophagus demonstrate no significant findings. Lungs/Pleura: Numerous bilateral pulmonary nodules are again identified consistent with known metastases. Nodules have increased in size and number since prior study consistent with progression of disease. Index nodules are as follows: Left lower lobe, image 69/7, 2.9 x 1.3 cm.  Previously 2.5 x 1.3 cm. Right lower lobe, image 75/7, 3.9 x 1.9 cm. Previously 3.7 x 1.7 cm. No effusion or pneumothorax.  Central airways are patent. Upper Abdomen: Ill-defined cystic and solid mass within the head of the pancreas measuring up to 4.4 x 2.7 cm, compatible with known history of pancreatic cancer. Indeterminate 1.9 cm hypodensity right lobe liver image 144/5 may reflect metastatic disease. This is new since prior study. Musculoskeletal: There are no acute or destructive bony lesions. Stable right chest wall port. Reconstructed images demonstrate no additional findings. Review of the MIP images confirms the above findings. IMPRESSION: 1. No evidence of pulmonary embolus. 2. New and enlarging bilateral pulmonary  nodules as above, consistent with progression of pulmonary metastases. 3. Complex cystic and solid pancreatic mass compatible with known pancreatic neoplasm. 4. New indeterminate 1.9 cm right lobe liver hypodensity, suspicious for hepatic metastasis. 5.  Aortic Atherosclerosis (ICD10-I70.0). Electronically Signed   By: Ozell Daring M.D.   On: 09/13/2024 16:21   DG Chest 2 View Result Date: 09/13/2024 EXAM: 2 VIEW(S) XRAY OF THE CHEST 09/13/2024 12:15:00 PM COMPARISON: 07/02/2024 CLINICAL HISTORY: CP FINDINGS: LINES, TUBES AND DEVICES: Right chest wall Port-A-Cath in place with tip overlying the mid SVC level. LUNGS AND PLEURA: Left lower lobe airspace opacity. Bilateral pulmonary nodules. Possible trace left pleural effusion. No pneumothorax. HEART AND MEDIASTINUM: Aortic calcification. No acute abnormality of the cardiac and mediastinal silhouettes. BONES AND SOFT TISSUES: No acute osseous abnormality. IMPRESSION: 1. Left lower lobe airspace opacity, suspicious for atelectasis or pneumonia. 2. Bilateral pulmonary nodules similar to prior 3. Possible trace left pleural effusion. 4. Right chest wall Port-A-Cath with tip overlying the mid SVC. Electronically signed by: Camellia Candle MD 09/13/2024 12:46 PM EST RP Workstation: [id number]     Assessment and Plan:  NSTEMI  CAD -   Risk Assessment/Risk Scores: {Complete the following score calculators/questions to meet required metrics.  Press F2         :[id number]}   {Is the patient being seen for unstable angina, ACS, NSTEMI or STEMI?:[phone]} {Does this patient have CHF or CHF symptoms?      :[id number]} {Does this patient have ATRIAL FIBRILLATION?[phone]}  {Are we signing off today?:[id number]}  For questions or updates, please contact [facility] Please consult [email or web address] for contact info under    {TIP  Split Shared Billing  Do NOT delete  any part of this including brackets If split shared billing is based upon MDM,  disregard If billing will be based upon TIME you MUST document the number of minutes and a detailed list of what was done in that time in the following format Example - I spent ** minutes seeing this patient. During that time I reviewed their history, evaluated their symptoms, reviewed available labs, EKGs, studies, performed an exam and formulated an assessment and plan   :1} {Select this only if you need to document critical care time (Optional):[phone]} Signed, Rollo FABIENE Louder, PA-C  09/13/2024 4:52 PM

## 2024-09-14 ENCOUNTER — Inpatient Hospital Stay (HOSPITAL_COMMUNITY)

## 2024-09-14 ENCOUNTER — Other Ambulatory Visit (HOSPITAL_BASED_OUTPATIENT_CLINIC_OR_DEPARTMENT_OTHER): Payer: Self-pay

## 2024-09-14 ENCOUNTER — Encounter (HOSPITAL_COMMUNITY): Payer: Self-pay | Admitting: Internal Medicine

## 2024-09-14 ENCOUNTER — Encounter: Payer: Self-pay | Admitting: Oncology

## 2024-09-14 DIAGNOSIS — I2510 Atherosclerotic heart disease of native coronary artery without angina pectoris: Secondary | ICD-10-CM | POA: Diagnosis not present

## 2024-09-14 DIAGNOSIS — I10 Essential (primary) hypertension: Secondary | ICD-10-CM | POA: Diagnosis not present

## 2024-09-14 DIAGNOSIS — I214 Non-ST elevation (NSTEMI) myocardial infarction: Secondary | ICD-10-CM | POA: Diagnosis not present

## 2024-09-14 DIAGNOSIS — Z8673 Personal history of transient ischemic attack (TIA), and cerebral infarction without residual deficits: Secondary | ICD-10-CM | POA: Diagnosis not present

## 2024-09-14 DIAGNOSIS — Z86718 Personal history of other venous thrombosis and embolism: Secondary | ICD-10-CM | POA: Diagnosis not present

## 2024-09-14 DIAGNOSIS — R918 Other nonspecific abnormal finding of lung field: Secondary | ICD-10-CM | POA: Diagnosis not present

## 2024-09-14 DIAGNOSIS — C250 Malignant neoplasm of head of pancreas: Secondary | ICD-10-CM | POA: Diagnosis not present

## 2024-09-14 DIAGNOSIS — I700 Atherosclerosis of aorta: Secondary | ICD-10-CM | POA: Diagnosis not present

## 2024-09-14 LAB — ECHOCARDIOGRAM COMPLETE
AR max vel: 2.84 cm2
AV Area VTI: 2.62 cm2
AV Area mean vel: 2.62 cm2
AV Mean grad: 3 mmHg
AV Peak grad: 6.2 mmHg
Ao pk vel: 1.24 m/s
Area-P 1/2: 3.31 cm2
Height: 66 in
S' Lateral: 3.5 cm
Weight: 3691.2 [oz_av]

## 2024-09-14 LAB — BASIC METABOLIC PANEL WITH GFR
Anion gap: 11 (ref 5–15)
BUN: 18 mg/dL (ref 8–23)
CO2: 25 mmol/L (ref 22–32)
Calcium: 8.6 mg/dL — ABNORMAL LOW (ref 8.9–10.3)
Chloride: 104 mmol/L (ref 98–111)
Creatinine, Ser: 1.1 mg/dL (ref 0.61–1.24)
GFR, Estimated: >60 mL/min (ref >60)
Glucose, Bld: 103 mg/dL — ABNORMAL HIGH (ref 70–99)
Potassium: 3.8 mmol/L (ref 3.5–5.1)
Sodium: 140 mmol/L (ref 135–145)

## 2024-09-14 LAB — APTT: aPTT: >200 s (ref 24–36)

## 2024-09-14 LAB — LIPID PANEL
Cholesterol: 176 mg/dL (ref 0–200)
HDL: 77 mg/dL (ref >40)
LDL Cholesterol: 70 mg/dL (ref 0–99)
Total CHOL/HDL Ratio: 2.3 ratio
Triglycerides: 145 mg/dL (ref <150)
VLDL: 29 mg/dL (ref 0–40)

## 2024-09-14 LAB — CBC
HCT: 37.3 % — ABNORMAL LOW (ref 39.0–52.0)
Hemoglobin: 12.4 g/dL — ABNORMAL LOW (ref 13.0–17.0)
MCH: 32 pg (ref 26.0–34.0)
MCHC: 33.2 g/dL (ref 30.0–36.0)
MCV: 96.4 fL (ref 80.0–100.0)
Platelets: 142 K/uL — ABNORMAL LOW (ref 150–400)
RBC: 3.87 MIL/uL — ABNORMAL LOW (ref 4.22–5.81)
RDW: 14.9 % (ref 11.5–15.5)
WBC: 10.1 K/uL (ref 4.0–10.5)
nRBC: 0.3 % — ABNORMAL HIGH (ref 0.0–0.2)

## 2024-09-14 LAB — HEPARIN LEVEL (UNFRACTIONATED): Heparin Unfractionated: >1.1 [IU]/mL — ABNORMAL HIGH (ref 0.30–0.70)

## 2024-09-14 MED ORDER — HEPARIN (PORCINE) 25000 UT/250ML-% IV SOLN
850.0000 [IU]/h | INTRAVENOUS | Status: DC
  Administered 2024-09-14: 850 [IU]/h via INTRAVENOUS

## 2024-09-14 MED ORDER — LIDOCAINE 5 % EX PTCH
1.0000 | MEDICATED_PATCH | Freq: Every day | CUTANEOUS | 0 refills | Status: AC
  Filled 2024-09-14 – 2024-09-15 (×2): qty 30, 30d supply, fill #0

## 2024-09-14 MED ORDER — PERFLUTREN LIPID MICROSPHERE
1.0000 mL | INTRAVENOUS | Status: DC | PRN
  Administered 2024-09-14: 4 mL via INTRAVENOUS

## 2024-09-14 MED ORDER — APIXABAN 5 MG PO TABS: 5.0000 mg | ORAL_TABLET | Freq: Two times a day (BID) | ORAL | Status: DC

## 2024-09-14 MED ORDER — ZOLPIDEM TARTRATE 5 MG PO TABS: 10.0000 mg | ORAL_TABLET | Freq: Every evening | ORAL | Status: DC | PRN

## 2024-09-14 MED ORDER — HEPARIN SOD (PORK) LOCK FLUSH 100 UNIT/ML IV SOLN
500.0000 [IU] | INTRAVENOUS | Status: AC | PRN
  Administered 2024-09-14: 500 [IU]

## 2024-09-14 NOTE — Discharge Summary (Addendum)
 Physician Discharge Summary  [name] FMW:[id number] DOB: 10-27-1940 DOA: 09/13/2024  PCP: Marvene Prentice SAUNDERS, FNP  Admit date: 09/13/2024 Discharge date: 09/14/2024  Admitted From:  Discharge disposition: home   Recommendations for Outpatient Follow-Up:   Follow up with oncology and cardiology    Discharge Diagnosis:   Principal Problem:   NSTEMI (non-ST elevated myocardial infarction) [facility]) Active Problems:   CAD (coronary artery disease)   History of pulmonary embolus (PE)   Essential hypertension   Cancer of head of pancreas (HCC)   History of CVA (cerebrovascular accident)    Discharge Condition: Improved.  Diet recommendation: regular  Wound care: None.  Code status: dnr   History of Present Illness:    Timothy Wilcox is a 83 y.o. male with medical history significant of hypertension, hyperlipidemia,  CVA, CAD status post stent x 2, pancreatic cancer s/p chemotherapy, PE on Eliquis  presenting with chest pain.   Pain started earlier today and described as substernal chest pain that is similar to his heartburn pain that he had in the past but has been lasting longer than heartburn.  Tried antacid at home without improvement.  Denies shortness of breath nor any worsening edema.   Further denies fevers, chills, abdominal pain, constipation, diarrhea, nausea, vomiting.   Hospital Course by Problem:   NSTEMI: Currently chest pain free, peak trop HS 1500s. -cardiology consult Patient with pancreatic cancer w/METS, clearly wishes not to undergo cardiac catheterization. Respect patient's wishes. Continue aspirin  81 mg daily. Patient wishes not to be on any additional medical therapy.   Hypertension: Controlled   Mixed hyperlipidemia: Not on statin due to intolerance   H/o PE: On eliquis  at home, currently on heparin  infusion while hospitalized. Resume Eliquis  on discharge.      Medical Consultants:   cards   Discharge Exam:    Vitals:   09/14/24 0819 09/14/24 1216  BP: 122/71 114/70  Pulse: 72 73  Resp: 20 19  Temp: (!) 97.1 F (36.2 C) 98.2 F (36.8 C)  SpO2: 94% 99%   Vitals:   09/14/24 0636 09/14/24 0700 09/14/24 0819 09/14/24 1216  BP: 127/65  122/71 114/70  Pulse: 68  72 73  Resp:   20 19  Temp: 97.7 F (36.5 C)  (!) 97.1 F (36.2 C) 98.2 F (36.8 C)  TempSrc: Oral  Oral Oral  SpO2: 93%  94% 99%  Weight:  104.6 kg    Height:        General exam: Appears calm and comfortable   The results of significant diagnostics from this hospitalization (including imaging, microbiology, ancillary and laboratory) are listed below for reference.     Procedures and Diagnostic Studies:   CT Angio Chest PE W and/or Wo Contrast Result Date: 09/13/2024 CLINICAL DATA:  Chest pain, suspected pulmonary embolism, history of metastatic pancreatic cancer EXAM: CT ANGIOGRAPHY CHEST WITH CONTRAST TECHNIQUE: Multidetector CT imaging of the chest was performed using the standard protocol during bolus administration of intravenous contrast. Multiplanar CT image reconstructions and MIPs were obtained to evaluate the vascular anatomy. RADIATION DOSE REDUCTION: This exam was performed according to the departmental dose-optimization program which includes automated exposure control, adjustment of the mA and/or kV according to patient size and/or use of iterative reconstruction technique. CONTRAST:  85mL OMNIPAQUE  IOHEXOL  350 MG/ML SOLN COMPARISON:  07/29/2024, 09/13/2024 FINDINGS: Cardiovascular: This is a technically adequate evaluation of the pulmonary vasculature. There are no filling defects or pulmonary emboli. The heart is unremarkable without pericardial  effusion. Calcifications of the mitral valve again noted. No evidence of thoracic aortic aneurysm or dissection. Atherosclerosis of the aorta and coronary vasculature. Mediastinum/Nodes: No enlarged mediastinal, hilar, or axillary lymph nodes. Thyroid  gland, trachea, and  esophagus demonstrate no significant findings. Lungs/Pleura: Numerous bilateral pulmonary nodules are again identified consistent with known metastases. Nodules have increased in size and number since prior study consistent with progression of disease. Index nodules are as follows: Left lower lobe, image 69/7, 2.9 x 1.3 cm.  Previously 2.5 x 1.3 cm. Right lower lobe, image 75/7, 3.9 x 1.9 cm. Previously 3.7 x 1.7 cm. No effusion or pneumothorax.  Central airways are patent. Upper Abdomen: Ill-defined cystic and solid mass within the head of the pancreas measuring up to 4.4 x 2.7 cm, compatible with known history of pancreatic cancer. Indeterminate 1.9 cm hypodensity right lobe liver image 144/5 may reflect metastatic disease. This is new since prior study. Musculoskeletal: There are no acute or destructive bony lesions. Stable right chest wall port. Reconstructed images demonstrate no additional findings. Review of the MIP images confirms the above findings. IMPRESSION: 1. No evidence of pulmonary embolus. 2. New and enlarging bilateral pulmonary nodules as above, consistent with progression of pulmonary metastases. 3. Complex cystic and solid pancreatic mass compatible with known pancreatic neoplasm. 4. New indeterminate 1.9 cm right lobe liver hypodensity, suspicious for hepatic metastasis. 5.  Aortic Atherosclerosis (ICD10-I70.0). Electronically Signed   By: Ozell Daring M.D.   On: 09/13/2024 16:21   DG Chest 2 View Result Date: 09/13/2024 EXAM: 2 VIEW(S) XRAY OF THE CHEST 09/13/2024 12:15:00 PM COMPARISON: 07/02/2024 CLINICAL HISTORY: CP FINDINGS: LINES, TUBES AND DEVICES: Right chest wall Port-A-Cath in place with tip overlying the mid SVC level. LUNGS AND PLEURA: Left lower lobe airspace opacity. Bilateral pulmonary nodules. Possible trace left pleural effusion. No pneumothorax. HEART AND MEDIASTINUM: Aortic calcification. No acute abnormality of the cardiac and mediastinal silhouettes. BONES AND SOFT  TISSUES: No acute osseous abnormality. IMPRESSION: 1. Left lower lobe airspace opacity, suspicious for atelectasis or pneumonia. 2. Bilateral pulmonary nodules similar to prior 3. Possible trace left pleural effusion. 4. Right chest wall Port-A-Cath with tip overlying the mid SVC. Electronically signed by: Camellia Candle MD 09/13/2024 12:46 PM EST RP Workstation: [id number]     Labs:   Basic Metabolic Panel: Recent Labs  Lab 09/09/24 0953 09/13/24 1112 09/14/24 0644  NA 140 142 140  K 3.9 4.0 3.8  CL 104 107 104  CO2 23 27 25   GLUCOSE 195* 161* 103*  BUN 15 18 18   CREATININE 1.02 1.13 1.10  CALCIUM  9.7 9.0 8.6*   GFR Estimated Creatinine Clearance: 59.7 mL/min (by C-G formula based on SCr of 1.1 mg/dL). Liver Function Tests: Recent Labs  Lab 09/09/24 0953  AST 29  ALT 27  ALKPHOS 61  BILITOT 0.3  PROT 6.0*  ALBUMIN  3.7   No results for input(s): LIPASE, AMYLASE in the last 168 hours. No results for input(s): AMMONIA in the last 168 hours. Coagulation profile No results for input(s): INR, PROTIME in the last 168 hours.  CBC: Recent Labs  Lab 09/09/24 0953 09/13/24 1112 09/14/24 0644  WBC 8.5 9.6 10.1  NEUTROABS 5.9  --   --   HGB 13.1 14.0 12.4*  HCT 39.1 43.0 37.3*  MCV 97.5 98.6 96.4  PLT 218 154 142*   Cardiac Enzymes: No results for input(s): CKTOTAL, CKMB, CKMBINDEX, TROPONINI in the last 168 hours. BNP: Invalid input(s): POCBNP CBG: No results for input(s): GLUCAP in the  last 168 hours. D-Dimer No results for input(s): DDIMER in the last 72 hours. Hgb A1c No results for input(s): HGBA1C in the last 72 hours. Lipid Profile Recent Labs    09/14/24 0644  CHOL 176  HDL 77  LDLCALC 70  TRIG 145  CHOLHDL 2.3   Thyroid  function studies No results for input(s): TSH, T4TOTAL, T3FREE, THYROIDAB in the last 72 hours.  Invalid input(s): FREET3 Anemia work up No results for input(s): VITAMINB12, FOLATE,  FERRITIN, TIBC, IRON, RETICCTPCT in the last 72 hours. Microbiology No results found for this or any previous visit (from the past 240 hours).   Discharge Instructions:   Discharge Instructions     Diet - low sodium heart healthy   Complete by: As directed    Increase activity slowly   Complete by: As directed       Allergies as of 09/14/2024       Reactions   Brilinta  [ticagrelor ] Shortness Of Breath, Other (See Comments)   Dizziness, weakness/Caused hospital admission   Plavix  [clopidogrel ] Shortness Of Breath, Other (See Comments)   Fatigue, also   Statins Other (See Comments)   Pain in joints, can't move knees   Clotrimazole-betamethasone Other (See Comments)   Patient cannot recall symptoms   Potassium Citrate Diarrhea, Other (See Comments)   Severe stomach pain   Tagamet [cimetidine] Other (See Comments)   Gynecomastia    Tizanidine  Hcl Other (See Comments)   Pt cannot recall symptoms   Zyrtec [cetirizine Hcl] Other (See Comments)   Pt cannot recall symptoms   Dicyclomine Anxiety, Other (See Comments)   Interfered with sleep- made nervous   Zofran  [ondansetron ] Other (See Comments)   Constipation         Medication List     STOP taking these medications    hydrocortisone 1 % ointment   ibuprofen  200 MG tablet Commonly known as: ADVIL    ketoconazole 2 % shampoo Commonly known as: NIZORAL   tacrolimus 0.1 % ointment Commonly known as: PROTOPIC       TAKE these medications    allopurinol  300 MG tablet Commonly known as: ZYLOPRIM  Take 1 tablet (300 mg total) by mouth daily.   amLODipine  5 MG tablet Commonly known as: NORVASC  Take 1 tablet (5 mg total) by mouth daily.   aspirin  EC 81 MG tablet Take 81 mg by mouth daily.   Benefiber Powd Take 4 g by mouth See admin instructions. Mix 4 grams (1 teaspoonful) into a desired beverage and drink in the morning   Culturelle Caps Take 1 capsule by mouth in the morning.   Eliquis  5 MG  Tabs tablet Generic drug: apixaban  Take 1 tablet (5 mg total) by mouth 2 (two) times daily.   fluticasone  50 MCG/ACT nasal spray Commonly known as: FLONASE  Place 2 sprays into both nostrils See admin instructions. Instill 2 sprays into both nostrils in the morning and evening   HYDROcodone -acetaminophen  5-325 MG tablet Commonly known as: NORCO/VICODIN Take 1 tablet by mouth every 6 (six) hours as needed for moderate pain (pain score 4-6).   lidocaine  5 % Commonly known as: LIDODERM  Place 1 patch onto the skin daily. May wear up to 12 hours. What changed: additional instructions   loratadine  10 MG tablet Commonly known as: CLARITIN  Take 10 mg by mouth See admin instructions. Take 10 mg by mouth in the morning and evening   multivitamin with minerals Tabs tablet Take 1 tablet by mouth daily with breakfast.   nitroGLYCERIN  0.4 MG SL  tablet Commonly known as: Nitrostat  Place 1 tablet (0.4 mg total) under the tongue every 5 (five) minutes as needed for chest pain. X 3 doses   predniSONE  20 MG tablet Commonly known as: DELTASONE  Take 1 tablet (20 mg total) by mouth daily with breakfast.   PRESCRIPTION MEDICATION CPAP- At bedtime   Salonpas Pain Relief Patch Ptch Apply 1 patch topically daily as needed (for pain).   STOOL SOFTENER PO Take 1 tablet by mouth daily.   zolpidem  10 MG tablet Commonly known as: AMBIEN  Take 1 tablet (10 mg total) by mouth at bedtime as needed.        Follow-up Information     Marvene Prentice SAUNDERS, FNP Follow up in 1 week(s).   Specialty: Family Medicine Contact information: [phone] [address] [place] [address] [phone]                  Time coordinating discharge: 45 min  Signed:  Harlene RAYMOND Bowl DO  [facility] 09/14/2024, 2:01 PM

## 2024-09-14 NOTE — Progress Notes (Signed)
 Reviewed AVS, patient expressed understanding of medications, MD follow up reviewed.   Patient states all belongings brought to the hospital at time of admission are accounted for and packed to take home.  Patient informed and expressed understanding where to pick up discharge medications.  Nursing staff contacted to transport patient to entrance A, where family member was waiting in vehicle to transport home.

## 2024-09-14 NOTE — Progress Notes (Signed)
 PROGRESS NOTE    Timothy Wilcox  FMW:[id number] DOB: 06-02-1941 DOA: 09/13/2024 PCP: Marvene Prentice SAUNDERS, FNP    Brief Narrative:  Timothy Wilcox is a 83 y.o. male with medical history significant of hypertension, hyperlipidemia,  CVA, CAD status post stent x 2, pancreatic cancer s/p chemotherapy, PE on Eliquis  presenting with chest pain.   Pain started earlier today and described as substernal chest pain that is similar to his heartburn pain that he had in the past but has been lasting longer than heartburn.  Tried antacid at home without improvement.  Denies shortness of breath nor any worsening edema.   Assessment and Plan: NSTEMI CAD > Patient presenting with chest pain.  Found to have ST elevation that was borderline in lead II and aVF.  ST depression noted in anterior lateral leads. > Cardiology consulted and were considering STEMI versus NSTEMI and taken to Cath Lab today.  However interventional cardiologist further reviewed repeat EKGs and determined that patient did not need to go to Cath Lab today. > History of CAD and prior stenting x 2.  Not currently on statin nor antiplatelet (other than ASA) due to history of intolerance (plavix  and brilenta) - Appreciate cardiology recommendations assistance - Heparin  infusion - ASA - Echocardiogram  Hypertension - Continue home amlodipine    Hyperlipidemia - Not currently on statin due to intolerance   Pancreatic cancer - Status post immunotherapy - Follows with oncology- added Dr. Cloretta to treatment team   History of PE > On Eliquis  at home - Heparin  infusion for now as above   DVT prophylaxis:     Code Status: Limited: Do not attempt resuscitation (DNR) -DNR-LIMITED -Do Not Intubate/DNI    Disposition Plan:  Level of care: Progressive Status is: Inpatient     Consultants:  Cards Oncology (added to treatment team)   Subjective: Does not tolerate Plavix  or Brilinta  due to leg weakness Takes 10 mg of  Ambien  at night and would like to have that here  Objective: Vitals:   09/14/24 0025 09/14/24 0636 09/14/24 0700 09/14/24 0819  BP: 122/61 127/65  122/71  Pulse: 69 68  72  Resp: (!) 22   20  Temp: 98.1 F (36.7 C) 97.7 F (36.5 C)  (!) 97.1 F (36.2 C)  TempSrc: Axillary Oral  Oral  SpO2: 100% 93%  94%  Weight:   104.6 kg   Height:        Intake/Output Summary (Last 24 hours) at 09/14/2024 1013 Last data filed at 09/14/2024 0400 Gross per 24 hour  Intake 312.1 ml  Output --  Net 312.1 ml   Filed Weights   09/13/24 1857 09/13/24 1944 09/14/24 0700  Weight: 105.2 kg 105.8 kg 104.6 kg    Examination:   General: Appearance:    Obese male in no acute distress     Lungs:     respirations unlabored  Heart:    Normal heart rate.   MS:   All extremities are intact.    Neurologic:   Awake, alert, oriented x 3. No apparent focal neurological           defect.        Data Reviewed: I have personally reviewed following labs and imaging studies  CBC: Recent Labs  Lab 09/09/24 0953 09/13/24 1112 09/14/24 0644  WBC 8.5 9.6 10.1  NEUTROABS 5.9  --   --   HGB 13.1 14.0 12.4*  HCT 39.1 43.0 37.3*  MCV 97.5 98.6 96.4  PLT 218  154 142*   Basic Metabolic Panel: Recent Labs  Lab 09/09/24 0953 09/13/24 1112 09/14/24 0644  NA 140 142 140  K 3.9 4.0 3.8  CL 104 107 104  CO2 23 27 25   GLUCOSE 195* 161* 103*  BUN 15 18 18   CREATININE 1.02 1.13 1.10  CALCIUM  9.7 9.0 8.6*   GFR: Estimated Creatinine Clearance: 59.7 mL/min (by C-G formula based on SCr of 1.1 mg/dL). Liver Function Tests: Recent Labs  Lab 09/09/24 0953  AST 29  ALT 27  ALKPHOS 61  BILITOT 0.3  PROT 6.0*  ALBUMIN  3.7   No results for input(s): LIPASE, AMYLASE in the last 168 hours. No results for input(s): AMMONIA in the last 168 hours. Coagulation Profile: No results for input(s): INR, PROTIME in the last 168 hours. Cardiac Enzymes: No results for input(s): CKTOTAL, CKMB,  CKMBINDEX, TROPONINI in the last 168 hours. BNP (last 3 results) No results for input(s): PROBNP in the last 8760 hours. HbA1C: No results for input(s): HGBA1C in the last 72 hours. CBG: No results for input(s): GLUCAP in the last 168 hours. Lipid Profile: Recent Labs    09/14/24 0644  CHOL 176  HDL 77  LDLCALC 70  TRIG 145  CHOLHDL 2.3   Thyroid  Function Tests: No results for input(s): TSH, T4TOTAL, FREET4, T3FREE, THYROIDAB in the last 72 hours. Anemia Panel: No results for input(s): VITAMINB12, FOLATE, FERRITIN, TIBC, IRON, RETICCTPCT in the last 72 hours. Sepsis Labs: No results for input(s): PROCALCITON, LATICACIDVEN in the last 168 hours.  No results found for this or any previous visit (from the past 240 hours).       Radiology Studies: CT Angio Chest PE W and/or Wo Contrast Result Date: 09/13/2024 CLINICAL DATA:  Chest pain, suspected pulmonary embolism, history of metastatic pancreatic cancer EXAM: CT ANGIOGRAPHY CHEST WITH CONTRAST TECHNIQUE: Multidetector CT imaging of the chest was performed using the standard protocol during bolus administration of intravenous contrast. Multiplanar CT image reconstructions and MIPs were obtained to evaluate the vascular anatomy. RADIATION DOSE REDUCTION: This exam was performed according to the departmental dose-optimization program which includes automated exposure control, adjustment of the mA and/or kV according to patient size and/or use of iterative reconstruction technique. CONTRAST:  85mL OMNIPAQUE  IOHEXOL  350 MG/ML SOLN COMPARISON:  07/29/2024, 09/13/2024 FINDINGS: Cardiovascular: This is a technically adequate evaluation of the pulmonary vasculature. There are no filling defects or pulmonary emboli. The heart is unremarkable without pericardial effusion. Calcifications of the mitral valve again noted. No evidence of thoracic aortic aneurysm or dissection. Atherosclerosis of the aorta and  coronary vasculature. Mediastinum/Nodes: No enlarged mediastinal, hilar, or axillary lymph nodes. Thyroid  gland, trachea, and esophagus demonstrate no significant findings. Lungs/Pleura: Numerous bilateral pulmonary nodules are again identified consistent with known metastases. Nodules have increased in size and number since prior study consistent with progression of disease. Index nodules are as follows: Left lower lobe, image 69/7, 2.9 x 1.3 cm.  Previously 2.5 x 1.3 cm. Right lower lobe, image 75/7, 3.9 x 1.9 cm. Previously 3.7 x 1.7 cm. No effusion or pneumothorax.  Central airways are patent. Upper Abdomen: Ill-defined cystic and solid mass within the head of the pancreas measuring up to 4.4 x 2.7 cm, compatible with known history of pancreatic cancer. Indeterminate 1.9 cm hypodensity right lobe liver image 144/5 may reflect metastatic disease. This is new since prior study. Musculoskeletal: There are no acute or destructive bony lesions. Stable right chest wall port. Reconstructed images demonstrate no additional findings. Review of the  MIP images confirms the above findings. IMPRESSION: 1. No evidence of pulmonary embolus. 2. New and enlarging bilateral pulmonary nodules as above, consistent with progression of pulmonary metastases. 3. Complex cystic and solid pancreatic mass compatible with known pancreatic neoplasm. 4. New indeterminate 1.9 cm right lobe liver hypodensity, suspicious for hepatic metastasis. 5.  Aortic Atherosclerosis (ICD10-I70.0). Electronically Signed   By: Ozell Daring M.D.   On: 09/13/2024 16:21   DG Chest 2 View Result Date: 09/13/2024 EXAM: 2 VIEW(S) XRAY OF THE CHEST 09/13/2024 12:15:00 PM COMPARISON: 07/02/2024 CLINICAL HISTORY: CP FINDINGS: LINES, TUBES AND DEVICES: Right chest wall Port-A-Cath in place with tip overlying the mid SVC level. LUNGS AND PLEURA: Left lower lobe airspace opacity. Bilateral pulmonary nodules. Possible trace left pleural effusion. No pneumothorax.  HEART AND MEDIASTINUM: Aortic calcification. No acute abnormality of the cardiac and mediastinal silhouettes. BONES AND SOFT TISSUES: No acute osseous abnormality. IMPRESSION: 1. Left lower lobe airspace opacity, suspicious for atelectasis or pneumonia. 2. Bilateral pulmonary nodules similar to prior 3. Possible trace left pleural effusion. 4. Right chest wall Port-A-Cath with tip overlying the mid SVC. Electronically signed by: Camellia Candle MD 09/13/2024 12:46 PM EST RP Workstation: [id number]        Scheduled Meds:  allopurinol   300 mg Oral Daily   amLODipine   5 mg Oral Daily   aspirin   324 mg Oral NOW   Or   aspirin   300 mg Rectal NOW   aspirin  EC  81 mg Oral Daily   predniSONE   20 mg Oral Q breakfast   Continuous Infusions:  heparin        LOS: 1 day    Time spent: 45 minutes spent on chart review, discussion with nursing staff, consultants, updating family and interview/physical exam; more than 50% of that time was spent in counseling and/or coordination of care.    Harlene RAYMOND Bowl, DO [facility] Available via Epic secure chat 7am-7pm After these hours, please refer to coverage provider listed on [email or web address] 09/14/2024, 10:13 AM

## 2024-09-14 NOTE — Progress Notes (Signed)
 ANTICOAGULATION CONSULT NOTE  Pharmacy Consult for Heparin  Indication: chest pain/ACS  Allergies  Allergen Reactions   Brilinta  [Ticagrelor ] Shortness Of Breath and Other (See Comments)    Dizziness, weakness/Caused hospital admission   Plavix  [Clopidogrel ] Shortness Of Breath and Other (See Comments)    Fatigue, also   Statins Other (See Comments)    Pain in joints, can't move knees   Clotrimazole-Betamethasone Other (See Comments)    Patient cannot recall symptoms   Potassium Citrate Diarrhea and Other (See Comments)    Severe stomach pain    Tagamet [Cimetidine] Other (See Comments)    Gynecomastia    Tizanidine  Hcl Other (See Comments)    Pt cannot recall symptoms   Zyrtec [Cetirizine Hcl] Other (See Comments)    Pt cannot recall symptoms   Dicyclomine Anxiety and Other (See Comments)    Interfered with sleep- made nervous   Zofran  Chesnie.childes ] Other (See Comments)    Constipation     Patient Measurements: Height: 5' 6 (167.6 cm) Weight: 104.6 kg (230 lb 11.2 oz) IBW/kg (Calculated) : 63.8 Heparin  Dosing Weight: 87.4 kg  Vital Signs: Temp: 97.1 F (36.2 C) (12/08 0819) Temp Source: Oral (12/08 0819) BP: 122/71 (12/08 0819) Pulse Rate: 72 (12/08 0819)  Labs: Recent Labs    09/13/24 1112 09/13/24 1312 09/13/24 1631 09/14/24 0644  HGB 14.0  --   --  12.4*  HCT 43.0  --   --  37.3*  PLT 154  --   --  142*  APTT  --   --   --  >200*  HEPARINUNFRC  --   --   --  >1.10*  CREATININE 1.13  --   --  1.10  TROPONINIHS 84* 238* 1,581*  --     Estimated Creatinine Clearance: 59.7 mL/min (by C-G formula based on SCr of 1.1 mg/dL).   Medical History: Past Medical History:  Diagnosis Date   Arthritis    CAD (coronary artery disease) 02/26/2018   A. Inf STEMI 6/18: LHC - pLCx 60, dRCA 100, EF 45-50 >> PCI:  DES to RCA // B. Echo 8/18: EF 60-65, nwm, grade 1 dd, MAC. C. 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 90 tablet 1 09/13/2024 Morning   amLODipine  (NORVASC ) 5 MG tablet Take 1 tablet (5 mg total) by mouth daily. 90 tablet 3 09/13/2024 Morning   apixaban  (ELIQUIS ) 5 MG TABS tablet Take 1 tablet (5 mg total) by mouth 2 (two) times daily. 60 tablet 5 09/13/2024 at  9:00 AM   aspirin  EC 81 MG tablet Take 81 mg by mouth daily.   09/13/2024 Morning   Docusate Calcium  (STOOL SOFTENER PO) Take 1 tablet by mouth daily.   Past Week   fluticasone  (FLONASE ) 50 MCG/ACT nasal spray Place 2 sprays into both nostrils See admin instructions. Instill 2 sprays into both nostrils in  the morning and evening   09/12/2024 Evening   Lactobacillus Rhamnosus, GG, (CULTURELLE) CAPS Take 1 capsule by mouth in the morning.   09/13/2024 Morning   loratadine  (CLARITIN ) 10 MG tablet Take 10 mg by mouth See admin instructions. Take 10 mg by mouth in the morning and evening   Past Week   Menthol-Methyl Salicylate (SALONPAS PAIN RELIEF PATCH) PTCH Apply 1 patch topically daily as needed (for pain).   Unknown   Multiple Vitamin (MULTIVITAMIN WITH MINERALS) TABS Take 1 tablet by mouth daily with breakfast.   Past Week   nitroGLYCERIN  (NITROSTAT ) 0.4 MG SL tablet Place 1 tablet (0.4 mg total) under the tongue every 5 (five) minutes as needed for chest pain. X 3 doses 25 tablet 0 Unknown   predniSONE  (DELTASONE ) 20 MG tablet Take 1 tablet (20 mg total) by mouth daily with breakfast. 90 tablet 0 09/13/2024 Morning   PRESCRIPTION MEDICATION CPAP- At bedtime   09/12/2024 Bedtime   Wheat Dextrin  (BENEFIBER) POWD Take 4 g by mouth See admin instructions. Mix 4 grams (1 teaspoonful) into a desired beverage and drink in the morning   Past Week   zolpidem  (AMBIEN ) 10 MG tablet Take 1 tablet (10 mg total) by mouth at bedtime as needed. 30 tablet 1 09/12/2024 Bedtime   HYDROcodone -acetaminophen  (NORCO/VICODIN) 5-325 MG tablet Take 1 tablet by mouth every 6 (six) hours as needed for moderate pain (pain score 4-6). (Patient not taking: No sig reported) 30 tablet 0 Not Taking   hydrocortisone 1 % ointment Apply 1 Application topically 2 (two) times daily as needed for itching. (Patient not taking: No sig reported)   Not Taking   ibuprofen  (ADVIL ,MOTRIN ) 200 MG tablet Take 400 mg by mouth every 6 (six) hours as needed for mild pain (pain score 1-3) (lower back pain). (Patient not taking: No sig reported)   Not Taking   ketoconazole (NIZORAL) 2 % shampoo Apply 1 Application topically See admin instructions. Use as a shampoo once a week (Patient not taking: No sig reported)   Not Taking   lidocaine  (LIDODERM ) 5 % Place 1 patch onto the skin daily. May wear for up to 12 hours. (Patient not taking: Reported on 09/13/2024) 30 patch 0 Not Taking   tacrolimus (PROTOPIC) 0.1 % ointment Apply 1 Application topically daily as needed (for rashes- affected areas). (Patient not taking: No sig reported)   Not Taking   Scheduled:   allopurinol   300 mg Oral Daily   amLODipine   5 mg Oral Daily   aspirin   324 mg Oral NOW   Or   aspirin   300 mg Rectal NOW   aspirin  EC  81 mg Oral Daily   predniSONE   20 mg Oral Q breakfast   Infusions:  PRN: acetaminophen , nitroGLYCERIN , ondansetron  (ZOFRAN ) IV, zolpidem   Assessment: 83 yom with a history of CAD, pancreatic cancer, hx of PE on eliquis , CVA, HLD, gout. Patient is presenting with chest pain. Heparin  per pharmacy consult placed for chest pain/ACS.  Heparin  level and aPTT both supratherapeutic (>1.1 and >200) this am. Confirmed drawn appropriately (peripheral stick,  heparin  infusing via port).   Goal of Therapy:  Heparin  level 0.3-0.7 units/ml aPTT 66-102 seconds Monitor platelets by anticoagulation protocol: Yes   Plan:  Hold heparin  x1 hour Resume at 850 units/h Repeat aPTT in 8h  Ozell Jamaica, PharmD, [place], [facility] Clinical Pharmacist [phone] Please check AMION for all [facility] Pharmacy numbers 09/14/2024

## 2024-09-14 NOTE — Plan of Care (Signed)
  Problem: Clinical Measurements: Goal: Diagnostic test results will improve Outcome: Progressing Goal: Respiratory complications will improve Outcome: Progressing Goal: Cardiovascular complication will be avoided Outcome: Progressing   Problem: Activity: Goal: Risk for activity intolerance will decrease Outcome: Progressing

## 2024-09-14 NOTE — Progress Notes (Addendum)
 Progress Note  Patient Name: Timothy Wilcox Date of Encounter: 09/14/2024  Primary Cardiologist: Ozell Fell, MD  Subjective   No chest pain this morning. Tentative plan for cath noted, received update it was delayed by multiple acute lab cases yesterday. The patient is quite clear he wishes to avoid cath. He reports he absolutely cannot take Brilinta ; previously had severe weakness with Plavix , not a candidate for Effient due to h/o CVA on imaging. With his pancreatic cancer, states he's lucky if he gets to feel good about 50% of the time, and does not want to rock the boat.  Inpatient Medications    Scheduled Meds:  allopurinol   300 mg Oral Daily   amLODipine   5 mg Oral Daily   aspirin   324 mg Oral NOW   Or   aspirin   300 mg Rectal NOW   aspirin  EC  81 mg Oral Daily   predniSONE   20 mg Oral Q breakfast   Continuous Infusions:  heparin  1,100 Units/hr (09/14/24 0400)   PRN Meds: acetaminophen , nitroGLYCERIN , ondansetron  (ZOFRAN ) IV, zolpidem    Vital Signs    Vitals:   09/14/24 0025 09/14/24 0636 09/14/24 0700 09/14/24 0819  BP: 122/61 127/65  122/71  Pulse: 69 68  72  Resp: (!) 22   20  Temp: 98.1 F (36.7 C) 97.7 F (36.5 C)  (!) 97.1 F (36.2 C)  TempSrc: Axillary Oral  Oral  SpO2: 100% 93%  94%  Weight:   104.6 kg   Height:        Intake/Output Summary (Last 24 hours) at 09/14/2024 0844 Last data filed at 09/14/2024 0400 Gross per 24 hour  Intake 312.1 ml  Output --  Net 312.1 ml      09/14/2024    7:00 AM 09/13/2024    7:44 PM 09/13/2024    6:57 PM  Last 3 Weights  Weight (lbs) 230 lb 11.2 oz 233 lb 3.2 oz 231 lb 14.8 oz  Weight (kg) 104.645 kg 105.779 kg 105.2 kg     Telemetry    NSR, rare PVCs  - Personally Reviewed  ECG    NSR 70bpm, subtle ST upsloping III, avF,  with TWI III, avF, otherwise no acute ST T changes  - Personally Reviewed  Physical Exam   GEN: No acute distress.  HEENT: Normocephalic, atraumatic, sclera  non-icteric. Neck: No JVD or bruits. Cardiac: RRR no murmurs, rubs, or gallops.  Respiratory: Clear to auscultation bilaterally. Breathing is unlabored. GI: Soft, nontender, non-distended, BS +x 4. MS: no deformity. Extremities: No clubbing or cyanosis. Trace BLE edema. Distal pedal pulses are 2+ and equal bilaterally. Neuro:  AAOx3. Follows commands. Psych:  Responds to questions appropriately with a normal affect.  Labs    High Sensitivity Troponin:   Recent Labs  Lab 09/13/24 1112 09/13/24 1312 09/13/24 1631  TROPONINIHS 84* 238* 1,581*      Cardiac EnzymesNo results for input(s): TROPONINI in the last 168 hours. No results for input(s): TROPIPOC in the last 168 hours.   Chemistry Recent Labs  Lab 09/09/24 0953 09/13/24 1112 09/14/24 0644  NA 140 142 140  K 3.9 4.0 3.8  CL 104 107 104  CO2 23 27 25   GLUCOSE 195* 161* 103*  BUN 15 18 18   CREATININE 1.02 1.13 1.10  CALCIUM  9.7 9.0 8.6*  PROT 6.0*  --   --   ALBUMIN  3.7  --   --   AST 29  --   --   ALT 27  --   --  ALKPHOS 61  --   --   BILITOT 0.3  --   --   GFRNONAA >60 >60 >60  ANIONGAP 12 8 11      Hematology Recent Labs  Lab 09/09/24 0953 09/13/24 1112 09/14/24 0644  WBC 8.5 9.6 10.1  RBC 4.01* 4.36 3.87*  HGB 13.1 14.0 12.4*  HCT 39.1 43.0 37.3*  MCV 97.5 98.6 96.4  MCH 32.7 32.1 32.0  MCHC 33.5 32.6 33.2  RDW 15.1 14.7 14.9  PLT 218 154 142*    BNPNo results for input(s): BNP, PROBNP in the last 168 hours.   DDimer No results for input(s): DDIMER in the last 168 hours.   Radiology    CT Angio Chest PE W and/or Wo Contrast Result Date: 09/13/2024 CLINICAL DATA:  Chest pain, suspected pulmonary embolism, history of metastatic pancreatic cancer EXAM: CT ANGIOGRAPHY CHEST WITH CONTRAST TECHNIQUE: Multidetector CT imaging of the chest was performed using the standard protocol during bolus administration of intravenous contrast. Multiplanar CT image reconstructions and MIPs were  obtained to evaluate the vascular anatomy. RADIATION DOSE REDUCTION: This exam was performed according to the departmental dose-optimization program which includes automated exposure control, adjustment of the mA and/or kV according to patient size and/or use of iterative reconstruction technique. CONTRAST:  85mL OMNIPAQUE  IOHEXOL  350 MG/ML SOLN COMPARISON:  07/29/2024, 09/13/2024 FINDINGS: Cardiovascular: This is a technically adequate evaluation of the pulmonary vasculature. There are no filling defects or pulmonary emboli. The heart is unremarkable without pericardial effusion. Calcifications of the mitral valve again noted. No evidence of thoracic aortic aneurysm or dissection. Atherosclerosis of the aorta and coronary vasculature. Mediastinum/Nodes: No enlarged mediastinal, hilar, or axillary lymph nodes. Thyroid  gland, trachea, and esophagus demonstrate no significant findings. Lungs/Pleura: Numerous bilateral pulmonary nodules are again identified consistent with known metastases. Nodules have increased in size and number since prior study consistent with progression of disease. Index nodules are as follows: Left lower lobe, image 69/7, 2.9 x 1.3 cm.  Previously 2.5 x 1.3 cm. Right lower lobe, image 75/7, 3.9 x 1.9 cm. Previously 3.7 x 1.7 cm. No effusion or pneumothorax.  Central airways are patent. Upper Abdomen: Ill-defined cystic and solid mass within the head of the pancreas measuring up to 4.4 x 2.7 cm, compatible with known history of pancreatic cancer. Indeterminate 1.9 cm hypodensity right lobe liver image 144/5 may reflect metastatic disease. This is new since prior study. Musculoskeletal: There are no acute or destructive bony lesions. Stable right chest wall port. Reconstructed images demonstrate no additional findings. Review of the MIP images confirms the above findings. IMPRESSION: 1. No evidence of pulmonary embolus. 2. New and enlarging bilateral pulmonary nodules as above, consistent with  progression of pulmonary metastases. 3. Complex cystic and solid pancreatic mass compatible with known pancreatic neoplasm. 4. New indeterminate 1.9 cm right lobe liver hypodensity, suspicious for hepatic metastasis. 5.  Aortic Atherosclerosis (ICD10-I70.0). Electronically Signed   By: Ozell Daring M.D.   On: 09/13/2024 16:21   DG Chest 2 View Result Date: 09/13/2024 EXAM: 2 VIEW(S) XRAY OF THE CHEST 09/13/2024 12:15:00 PM COMPARISON: 07/02/2024 CLINICAL HISTORY: CP FINDINGS: LINES, TUBES AND DEVICES: Right chest wall Port-A-Cath in place with tip overlying the mid SVC level. LUNGS AND PLEURA: Left lower lobe airspace opacity. Bilateral pulmonary nodules. Possible trace left pleural effusion. No pneumothorax. HEART AND MEDIASTINUM: Aortic calcification. No acute abnormality of the cardiac and mediastinal silhouettes. BONES AND SOFT TISSUES: No acute osseous abnormality. IMPRESSION: 1. Left lower lobe airspace opacity, suspicious for atelectasis  or pneumonia. 2. Bilateral pulmonary nodules similar to prior 3. Possible trace left pleural effusion. 4. Right chest wall Port-A-Cath with tip overlying the mid SVC. Electronically signed by: Camellia Candle MD 09/13/2024 12:46 PM EST RP Workstation: [id number]    Cardiac Studies   See prior caths  Echo pending  Echo 11/2023  1. Left ventricular ejection fraction, by estimation, is 60 to 65%. The  left ventricle has normal function. The left ventricle has no regional  wall motion abnormalities. Left ventricular diastolic parameters are  consistent with Grade I diastolic  dysfunction (impaired relaxation).   2. Right ventricular systolic function is normal. The right ventricular  size is normal.   3. The mitral valve is abnormal. No evidence of mitral valve  regurgitation. No evidence of mitral stenosis.   4. The aortic valve is tricuspid. There is moderate calcification of the  aortic valve. There is moderate thickening of the aortic valve. Aortic   valve regurgitation is not visualized. Aortic valve sclerosis is present,  with no evidence of aortic valve  stenosis.   5. The inferior vena cava is normal in size with greater than 50%  respiratory variability, suggesting right atrial pressure of 3 mmHg.     Patient Profile     83 y.o. 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  Assessment & Plan   1. Chest pain, known CAD, NSTEMI, transient inferior ST elevation - hsTroponin [phone] - recommended to go to lab yesterday but schedule prohibitive - given progression of pancreatic CA, question of MRI findings in 12/2023 (recommended for 6 week f/u to exclude brain mets), have asked Dr. Elmira to assess regarding moving forward with case - note prior intolerances to Plavix  and Brilinta  (dizziness, weakness, fatigue) - absolutely unwilling to take Brilinta  as it caused him to be wheelchair bound, and felt terrible on Plavix  for 3-4 months before some tolerability. Not a candidate for Effient with hx of CVA on imaging - discussed options of cath versus medical management with patient -he is pretty clear he wishes to avoid catheterization in lieu of med rx. With his pancreatic cancer, states he's lucky if he gets to feel good about 50% of the time, and does not want to rock the boat  - in the interim, continue ASA, heparin  at least 48 hours - see below re: statin - 2d echo pending - will review further with MD  2. Pancreatic cancer with pulmonary mets - CTA shows new and enlarging bilateral pulmonary nodules consistent with progression of pulmonary mets, known pancreatic neoplasm, and new indeterminate liver hypodensity suspicious for hepatic mets - Dr. Cloretta will see in  AM  3. HTN - BP acceptable  4. HLD, statin intolerant - historically statin intolerant, remotely on rosuvastatin  but reports taken off by oncology to limit pill burden, also previously declined ezetimibe or fibrates - lipid panel pending, but anticipate conservative approach with comorbidities  5. Hx DVT/PE - Eliquis  on hold, on heparin  per pharmacy  For questions or updates, please contact  [facility] Please consult [email or web address] for contact info under Cardiology/STEMI.  Signed, Athalee Esterline N Jacquilyn Seldon, PA-C 09/14/2024, 8:44 AM

## 2024-09-14 NOTE — TOC Initial Note (Signed)
 Transition of Care [facility]) - Initial/Assessment Note    Patient Details  Name: Timothy Wilcox MRN: [id number] Date of Birth: Mar 10, 1941  [facility] CM/SW Contact:    Sudie Erminio Deems, RN Phone Number: 09/14/2024, 12:32 PM  Clinical Narrative:  Patient presented for chest pain. PTA patient was from home with spouse. Additional family members at the bedside during the visit. Patient has PCP at [facility] and insurance. Patient states he has DME: CPAP, rolling walker, cane, bedside commode, and shower chair in the home. Patient is not active with any HH Services. ICM will continue to follow for additional needs as the patient progresses.   Expected Discharge Plan: Home/Self Care Barriers to Discharge: Continued Medical Work up   Patient Goals and CMS Choice Patient states their goals for this hospitalization and ongoing recovery are:: Plan to return home once stable.  Expected Discharge Plan and Services   Discharge Planning Services: CM Consult Post Acute Care Choice: NA Living arrangements for the past 2 months: Single Family Home                   DME Agency: NA  Prior Living Arrangements/Services Living arrangements for the past 2 months: Single Family Home Lives with:: Spouse Patient language and need for interpreter reviewed:: Yes Do you feel safe going back to the place where you live?: Yes      Need for Family Participation in Patient Care: Yes (Comment) Care giver support system in place?: Yes (comment) Current home services: DME (CPAP, rolling walker, cane, bedside commode, shower chair.) Criminal Activity/Legal Involvement Pertinent to Current Situation/Hospitalization: No - Comment as needed  Activities of Daily Living   ADL Screening (condition at time of admission) Independently performs ADLs?: Yes (appropriate for developmental age) Is the patient deaf or have difficulty hearing?: No Does the patient have difficulty seeing, even  when wearing glasses/contacts?: No Does the patient have difficulty concentrating, remembering, or making decisions?: No  Permission Sought/Granted Permission sought to share information with : Family Supports, Case Manager    Emotional Assessment Appearance:: Appears stated age Attitude/Demeanor/Rapport: Engaged Affect (typically observed): Appropriate Orientation: : Oriented to Self, Oriented to Place, Oriented to  Time, Oriented to Situation Alcohol / Substance Use: Not Applicable Psych Involvement: No (comment)  Admission diagnosis:  NSTEMI (non-ST elevated myocardial infarction) (HCC) [I21.4] ST elevation myocardial infarction (STEMI), unspecified artery (HCC) [I21.3] Patient Active Problem List   Diagnosis Date Noted   History of CVA (cerebrovascular accident) 09/13/2024   NSTEMI (non-ST elevated myocardial infarction) (HCC) 09/13/2024   Genetic testing 01/17/2024   Cancer of head of pancreas (HCC) 12/31/2023   Chest pain 12/09/2023   Essential hypertension 12/09/2023   Hematuria of undiagnosed cause 12/01/2023   History of pulmonary embolus (PE) 11/29/2023   Lung nodules 11/29/2023   Sleep apnea    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   Dyspnea    Acute hypoxic respiratory failure (HCC) 03/18/2018   Status post primary angioplasty with coronary stent 02/28/2018   Angina pectoris    CAD (coronary artery disease) 02/26/2018   Shortness of breath 02/26/2018   Hyperlipidemia 05/22/2017   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   PCP:  Marvene Prentice SAUNDERS, FNP Pharmacy:   [facility] [address] [place] [address] Phone: [phone] Fax: [phone]     Social Drivers of Health (SDOH) Social History: SDOH Screenings   Food Insecurity: No Food Insecurity (09/13/2024)  Housing: Low Risk  (09/13/2024)  Transportation Needs:  No Transportation Needs (09/13/2024)  Utilities: Not At Risk (09/13/2024)  Depression (PHQ2-9): Low  Risk  (09/09/2024)  Social Connections: Moderately Isolated (09/13/2024)  Tobacco Use: Low Risk  (09/09/2024)   SDOH Interventions:     Readmission Risk Interventions     No data to display

## 2024-09-14 NOTE — Care Management Obs Status (Signed)
 MEDICARE OBSERVATION STATUS NOTIFICATION   Patient Details  Name: DORIS MCGILVERY MRN: [id number] Date of Birth: [birth year]   Medicare Observation Status Notification Given:  Yes    Waddell Barnie Rama, RN 09/14/2024, 2:45 PM

## 2024-09-14 NOTE — Care Management CC44 (Signed)
 Condition Code 44 Documentation Completed  Patient Details  Name: VICKY MCCANLESS MRN: [id number] Date of Birth: 29-Dec-1940   Condition Code 44 given:  Yes Patient signature on Condition Code 44 notice:  Yes Documentation of 2 MD's agreement:  Yes Code 44 added to claim:  Yes    Waddell Barnie Rama, RN 09/14/2024, 2:46 PM

## 2024-09-14 NOTE — Plan of Care (Signed)
 Problem: Education: Goal: Understanding of cardiac disease, CV risk reduction, and recovery process will improve Outcome: Progressing Goal: Individualized Educational Video(s) Outcome: Progressing   Problem: Activity: Goal: Ability to tolerate increased activity will improve Outcome: Progressing   Problem: Cardiac: Goal: Ability to achieve and maintain adequate cardiovascular perfusion will improve Outcome: Progressing   Problem: Health Behavior/Discharge Planning: Goal: Ability to safely manage health-related needs after discharge will improve Outcome: Progressing   Problem: Education: Goal: Knowledge of General Education information will improve Description: Including pain rating scale, medication(s)/side effects and non-pharmacologic comfort measures Outcome: Progressing   Problem: Health Behavior/Discharge Planning: Goal: Ability to manage health-related needs will improve Outcome: Progressing   Problem: Clinical Measurements: Goal: Ability to maintain clinical measurements within normal limits will improve Outcome: Progressing Goal: Will remain free from infection Outcome: Progressing Goal: Diagnostic test results will improve Outcome: Progressing Goal: Respiratory complications will improve Outcome: Progressing Goal: Cardiovascular complication will be avoided Outcome: Progressing   Problem: Activity: Goal: Risk for activity intolerance will decrease Outcome: Progressing   Problem: Nutrition: Goal: Adequate nutrition will be maintained Outcome: Progressing   Problem: Coping: Goal: Level of anxiety will decrease Outcome: Progressing   Problem: Elimination: Goal: Will not experience complications related to bowel motility Outcome: Progressing Goal: Will not experience complications related to urinary retention Outcome: Progressing   Problem: Pain Managment: Goal: General experience of comfort will improve and/or be controlled Outcome: Progressing    Problem: Safety: Goal: Ability to remain free from injury will improve Outcome: Progressing   Problem: Skin Integrity: Goal: Risk for impaired skin integrity will decrease Outcome: Progressing   Problem: Education: Goal: Understanding of cardiac disease, CV risk reduction, and recovery process will improve 09/14/2024 1515 by Emil Roselie SAUNDERS, RN Outcome: Adequate for Discharge 09/14/2024 1514 by Emil Roselie SAUNDERS, RN Outcome: Progressing Goal: Individualized Educational Video(s) 09/14/2024 1515 by Emil Roselie SAUNDERS, RN Outcome: Adequate for Discharge 09/14/2024 1514 by Emil Roselie SAUNDERS, RN Outcome: Progressing   Problem: Activity: Goal: Ability to tolerate increased activity will improve 09/14/2024 1515 by Emil Roselie SAUNDERS, RN Outcome: Adequate for Discharge 09/14/2024 1514 by Emil Roselie SAUNDERS, RN Outcome: Progressing   Problem: Cardiac: Goal: Ability to achieve and maintain adequate cardiovascular perfusion will improve 09/14/2024 1515 by Emil Roselie SAUNDERS, RN Outcome: Adequate for Discharge 09/14/2024 1514 by Emil Roselie SAUNDERS, RN Outcome: Progressing   Problem: Health Behavior/Discharge Planning: Goal: Ability to safely manage health-related needs after discharge will improve 09/14/2024 1515 by Emil Roselie SAUNDERS, RN Outcome: Adequate for Discharge 09/14/2024 1514 by Emil Roselie SAUNDERS, RN Outcome: Progressing   Problem: Education: Goal: Knowledge of General Education information will improve Description: Including pain rating scale, medication(s)/side effects and non-pharmacologic comfort measures 09/14/2024 1515 by Emil Roselie SAUNDERS, RN Outcome: Adequate for Discharge 09/14/2024 1514 by Emil Roselie SAUNDERS, RN Outcome: Progressing   Problem: Health Behavior/Discharge Planning: Goal: Ability to manage health-related needs will improve 09/14/2024 1515 by Emil Roselie SAUNDERS, RN Outcome: Adequate for Discharge 09/14/2024 1514 by Emil Roselie SAUNDERS, RN Outcome:  Progressing   Problem: Clinical Measurements: Goal: Ability to maintain clinical measurements within normal limits will improve 09/14/2024 1515 by Emil Roselie SAUNDERS, RN Outcome: Adequate for Discharge 09/14/2024 1514 by Emil Roselie SAUNDERS, RN Outcome: Progressing Goal: Will remain free from infection 09/14/2024 1515 by Emil Roselie SAUNDERS, RN Outcome: Adequate for Discharge 09/14/2024 1514 by Emil Roselie SAUNDERS, RN Outcome: Progressing Goal: Diagnostic test results will improve 09/14/2024 1515 by Emil Roselie SAUNDERS, RN Outcome: Adequate for Discharge 09/14/2024 1514 by Emil,  Roselie SAUNDERS, RN Outcome: Progressing Goal: Respiratory complications will improve 09/14/2024 1515 by Emil Roselie SAUNDERS, RN Outcome: Adequate for Discharge 09/14/2024 1514 by Emil Roselie SAUNDERS, RN Outcome: Progressing Goal: Cardiovascular complication will be avoided 09/14/2024 1515 by Emil Roselie SAUNDERS, RN Outcome: Adequate for Discharge 09/14/2024 1514 by Emil Roselie SAUNDERS, RN Outcome: Progressing   Problem: Activity: Goal: Risk for activity intolerance will decrease 09/14/2024 1515 by Emil Roselie SAUNDERS, RN Outcome: Adequate for Discharge 09/14/2024 1514 by Emil Roselie SAUNDERS, RN Outcome: Progressing   Problem: Nutrition: Goal: Adequate nutrition will be maintained 09/14/2024 1515 by Emil Roselie SAUNDERS, RN Outcome: Adequate for Discharge 09/14/2024 1514 by Emil Roselie SAUNDERS, RN Outcome: Progressing   Problem: Coping: Goal: Level of anxiety will decrease 09/14/2024 1515 by Emil Roselie SAUNDERS, RN Outcome: Adequate for Discharge 09/14/2024 1514 by Emil Roselie SAUNDERS, RN Outcome: Progressing   Problem: Elimination: Goal: Will not experience complications related to bowel motility 09/14/2024 1515 by Emil Roselie SAUNDERS, RN Outcome: Adequate for Discharge 09/14/2024 1514 by Emil Roselie SAUNDERS, RN Outcome: Progressing Goal: Will not experience complications related to urinary retention 09/14/2024 1515 by  Emil Roselie SAUNDERS, RN Outcome: Adequate for Discharge 09/14/2024 1514 by Emil Roselie SAUNDERS, RN Outcome: Progressing   Problem: Pain Managment: Goal: General experience of comfort will improve and/or be controlled 09/14/2024 1515 by Emil Roselie SAUNDERS, RN Outcome: Adequate for Discharge 09/14/2024 1514 by Emil Roselie SAUNDERS, RN Outcome: Progressing   Problem: Safety: Goal: Ability to remain free from injury will improve 09/14/2024 1515 by Emil Roselie SAUNDERS, RN Outcome: Adequate for Discharge 09/14/2024 1514 by Emil Roselie SAUNDERS, RN Outcome: Progressing   Problem: Skin Integrity: Goal: Risk for impaired skin integrity will decrease 09/14/2024 1515 by Emil Roselie SAUNDERS, RN Outcome: Adequate for Discharge 09/14/2024 1514 by Emil Roselie SAUNDERS, RN Outcome: Progressing

## 2024-09-15 ENCOUNTER — Other Ambulatory Visit (HOSPITAL_BASED_OUTPATIENT_CLINIC_OR_DEPARTMENT_OTHER): Payer: Self-pay

## 2024-09-15 DIAGNOSIS — M7918 Myalgia, other site: Secondary | ICD-10-CM | POA: Diagnosis not present

## 2024-09-15 LAB — LIPOPROTEIN A (LPA): Lipoprotein (a): 36.2 nmol/L — ABNORMAL HIGH (ref <75.0)

## 2024-09-16 ENCOUNTER — Inpatient Hospital Stay: Admitting: Nurse Practitioner

## 2024-09-16 ENCOUNTER — Inpatient Hospital Stay

## 2024-09-17 ENCOUNTER — Inpatient Hospital Stay

## 2024-09-18 DIAGNOSIS — C250 Malignant neoplasm of head of pancreas: Secondary | ICD-10-CM | POA: Diagnosis not present

## 2024-09-19 ENCOUNTER — Inpatient Hospital Stay

## 2024-09-19 ENCOUNTER — Other Ambulatory Visit: Payer: Self-pay | Admitting: Oncology

## 2024-09-22 ENCOUNTER — Encounter (HOSPITAL_BASED_OUTPATIENT_CLINIC_OR_DEPARTMENT_OTHER): Payer: Self-pay

## 2024-09-23 ENCOUNTER — Other Ambulatory Visit (HOSPITAL_BASED_OUTPATIENT_CLINIC_OR_DEPARTMENT_OTHER): Payer: Self-pay

## 2024-09-23 ENCOUNTER — Encounter: Payer: Self-pay | Admitting: Nurse Practitioner

## 2024-09-23 ENCOUNTER — Inpatient Hospital Stay

## 2024-09-23 ENCOUNTER — Inpatient Hospital Stay: Admitting: Nurse Practitioner

## 2024-09-23 VITALS — BP 123/77 | HR 73 | Temp 98.0°F | Resp 18 | Ht 66.0 in | Wt 237.0 lb

## 2024-09-23 DIAGNOSIS — C250 Malignant neoplasm of head of pancreas: Secondary | ICD-10-CM

## 2024-09-23 DIAGNOSIS — Z5111 Encounter for antineoplastic chemotherapy: Secondary | ICD-10-CM | POA: Diagnosis not present

## 2024-09-23 LAB — CMP (CANCER CENTER ONLY)
ALT: 25 U/L (ref 0–44)
AST: 30 U/L (ref 15–41)
Albumin: 3.7 g/dL (ref 3.5–5.0)
Alkaline Phosphatase: 71 U/L (ref 38–126)
Anion gap: 12 (ref 5–15)
BUN: 16 mg/dL (ref 8–23)
CO2: 23 mmol/L (ref 22–32)
Calcium: 9.6 mg/dL (ref 8.9–10.3)
Chloride: 106 mmol/L (ref 98–111)
Creatinine: 1 mg/dL (ref 0.61–1.24)
GFR, Estimated: >60 mL/min (ref >60)
Glucose, Bld: 174 mg/dL — ABNORMAL HIGH (ref 70–99)
Potassium: 4.1 mmol/L (ref 3.5–5.1)
Sodium: 141 mmol/L (ref 135–145)
Total Bilirubin: 0.3 mg/dL (ref 0.0–1.2)
Total Protein: 6.1 g/dL — ABNORMAL LOW (ref 6.5–8.1)

## 2024-09-23 LAB — CBC WITH DIFFERENTIAL (CANCER CENTER ONLY)
Abs Immature Granulocytes: 0.06 K/uL (ref 0.00–0.07)
Basophils Absolute: 0 K/uL (ref 0.0–0.1)
Basophils Relative: 0 %
Eosinophils Absolute: 0.2 K/uL (ref 0.0–0.5)
Eosinophils Relative: 2 %
HCT: 37.3 % — ABNORMAL LOW (ref 39.0–52.0)
Hemoglobin: 12.5 g/dL — ABNORMAL LOW (ref 13.0–17.0)
Immature Granulocytes: 1 %
Lymphocytes Relative: 6 %
Lymphs Abs: 0.6 K/uL — ABNORMAL LOW (ref 0.7–4.0)
MCH: 32.7 pg (ref 26.0–34.0)
MCHC: 33.5 g/dL (ref 30.0–36.0)
MCV: 97.6 fL (ref 80.0–100.0)
Monocytes Absolute: 0.5 K/uL (ref 0.1–1.0)
Monocytes Relative: 5 %
Neutro Abs: 9.5 K/uL — ABNORMAL HIGH (ref 1.7–7.7)
Neutrophils Relative %: 86 %
Platelet Count: 147 K/uL — ABNORMAL LOW (ref 150–400)
RBC: 3.82 MIL/uL — ABNORMAL LOW (ref 4.22–5.81)
RDW: 15 % (ref 11.5–15.5)
WBC Count: 10.8 K/uL — ABNORMAL HIGH (ref 4.0–10.5)
nRBC: 0 % (ref 0.0–0.2)

## 2024-09-23 MED ORDER — ISOSORBIDE MONONITRATE ER 30 MG PO TB24
ORAL_TABLET | ORAL | 0 refills | Status: DC
  Filled 2024-09-23: qty 30, 33d supply, fill #0

## 2024-09-23 NOTE — Progress Notes (Unsigned)
 [facility] OFFICE PROGRESS NOTE   Diagnosis: Pancreas cancer  INTERVAL HISTORY:   Timothy Wilcox returns as scheduled.  He completed cycle 2 FOLFOX 09/09/2024.  He was hospitalized 09/13/2024 with chest pain.  Troponin was elevated.  Felt to have NSTEMI. Chest CT was negative for PE.  New and enlarging bilateral pulmonary nodules were noted.  A new indeterminate 1.9 cm right lobe liver lesion noted, felt to be suspicious for hepatic metastasis.  He declined cardiac catheterization as well as additional medical therapy.  He was discharged home 09/14/2024.  No further episodes of similar chest pain.  Chest felt bruised for a few days after the hospitalization.  Stable dyspnea.  Cough is better.  No nausea or vomiting following chemotherapy.  No mouth sores.  No diarrhea.  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  Objective:  Vital signs in last 24 hours:  Blood pressure 123/77, pulse 73, temperature 98 F (36.7 C), temperature source Temporal, resp. rate 18, height 5' 6 (1.676 m), weight 237 lb (107.5 kg), SpO2 100%.    HEENT: No thrush or ulcers. Resp: Distant breath sounds.  No respiratory distress. Cardio: Regular rate and rhythm. GI: No hepatosplenomegaly.  Nontender. Vascular: Trace pitting edema lower leg bilaterally. Neuro: Alert and oriented. Port-A-Cath without erythema.  Lab Results:  Lab Results  Component Value Date   WBC 10.8 (H) 09/23/2024   HGB 12.5 (L) 09/23/2024   HCT 37.3 (L) 09/23/2024   MCV 97.6 09/23/2024   PLT 147 (L) 09/23/2024   NEUTROABS 9.5 (H) 09/23/2024    Imaging:  No results found.  Medications: I have reviewed the patient's current medications.  Assessment/Plan: Pancreas cancer 11/19/2022: 2.9 cm thin-walled cystic lesion in the pancreas uncinate 07/28/2023: CT abdomen/pelvis: Stable 2.8 cm cystic area in the uncinate process 11/29/2023: CT chest-subsegmental and segmental pulmonary emboli in the left upper lobe,  dilation of the right ventricle, multiple pulmonary nodules 12/26/2023: CA 19-9- 66,286 12/27/2023: PET-5 cm hypermetabolic pancreas head/uncinate mass, numerous hypermetabolic pulmonary nodules, hypermetabolic left supraclavicular and retroperitoneal lymph nodes 01/06/2024: Left supraclavicular lymph node biopsy-metastatic moderately differentiated adenocarcinoma, 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, upper GI, breast, and salivary gland carcinoma Foundation 1 01/06/2024-HRDsig negative, microsatellite stable, tumor mutation burden 2, K-ras G12R Cycle 1 gemcitabine /Abraxane  01/16/2024 Cycle 2 gemcitabine /Abraxane  01/30/2024 Cycle 3 gemcitabine /Abraxane  02/13/2024 Cycle 4 gemcitabine /Abraxane  02/27/2024 Cycle 5 gemcitabine /Abraxane  03/12/2024 03/23/2024 CTs: Decrease size of pancreas head/uncinate mass, mixed response involving bilateral pulmonary nodules, decreased left supraclavicular retroperitoneal lymph nodes Cycle 6 gemcitabine /Abraxane  03/27/2024 Cycle 7 gemcitabine /Abraxane  04/09/2024 Cycle 8 gemcitabine /Abraxane  04/23/2024 Cycle 9 gemcitabine /Abraxane  05/07/2024 Cycle 10 gemcitabine /Abraxane  05/28/2024 Cycle 11 gemcitabine /Abraxane  06/12/2024 CTs 06/23/2024-increase in size of numerous bilateral irregular pulmonary nodules.  Pancreas head mass is similar in size.  Decreased size of left supraclavicular and retroperitoneal lymph nodes.  Per review by Dr. Cloretta, overall stable. Cycle 12 gemcitabine /Abraxane  06/25/2024, repeat noncontrast chest CT at a 6-week interval Cycle 13 gemcitabine /Abraxane  07/09/2024 Cycle 14 gemcitabine /Abraxane  07/23/2024 07/29/2024 CT chest: Progression of pulmonary metastases Cycle 1 FOLFOX 08/19/2024 Cycle 2 FOLFOX 09/09/2024 Cycle 3 FOLFOX 09/24/2024 Pulmonary embolism 11/29/2023 Small segmental and subsegmental pulmonary emboli in the left upper lobe, dilation of right ventricle Admitted for heparin  anticoagulation followed by apixaban -discharged  12/01/2023 Lower extremity Dopplers 11/30/2023: Acute left posterior tibial, peroneal, and TP trunk DVTs, acute right peroneal DVT Lovenox  12/31/2023 Lovenox  discontinued, Eliquis  initiated 04/14/2024   3.  CVAs-visual disturbance and altered mental status 12/30/2023 MRI brain-many foci of restricted diffusion in the cerebellum, left frontal, left basal  ganglia, and parietal lobes compatible with acute/subacute infarcts, consider embolic etiology 4.  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  Kidney stones 6.  Gout 7.  Family history of pancreas cancer 8.  History of basal cell carcinoma 9.  Negative genetic testing with VUS in EGFR.  First-degree relatives are candidates for considering pancreatic cancer screening based on family history.  Discussed with patient and wife 01/30/2024.   10. NSTEMI 09/13/2024    Disposition: Timothy Wilcox appears unchanged.  He completed cycle 2 FOLFOX beginning 09/09/2024.  He developed chest pain 09/13/2024.  He was admitted, seen by cardiology, diagnosed with NSTEMI.  He declined further evaluation as well as change in medical therapy.  He has a history of CAD/MI/stent placement 2018; most recent catheterization 2019 with stent to the mid LCx.  He has had no further chest pain.  We discussed the cardiotoxicity associated with 5-fluorouracil .  He understands it is possible the recent MI is related to the 5-fluorouracil .  He again declines further evaluation including cardiac catheterization.  He requests Dr. Margurite input prior to deciding about additional chemotherapy.  He is tentatively scheduled for cycle 3 FOLFOX tomorrow.  We will try to contact Dr. Wonda today.  Patient seen with Dr. Cloretta.  Timothy Wilcox ANP/GNP-BC   09/23/2024  11:49 AM  Addendum 4:27 PM-Dr. Cloretta spoke to Dr. Wonda, note to follow.  I contacted Timothy Wilcox for more discussion.  He understands it is possible the recent MI is related to 5-fluorouracil  and could  happen again and possibly be more severe.  We discussed changing treatment to the FLOX regimen with the plan to administer the first few cycles inpatient with cardiology assistance.  He declines hospitalization and also declines to change to the FLOX regimen.  He would like to proceed with FOLFOX as scheduled 09/24/2024.  He agrees to begin Imdur  15 mg daily for 7 days, then increase to 30 mg daily, as recommended by Dr. Wonda.  He will take the first dose tonight.  He understands to call 911 with recurrent chest pain.

## 2024-09-23 NOTE — Progress Notes (Deleted)
 [facility]   Telephone:(336) [phone] Fax:(336) [phone]    Patient Care Team: Marvene Prentice SAUNDERS, FNP as PCP - General (Family Medicine) Wonda Sharper, MD as PCP - Cardiology (Cardiology) Nori, Sari SQUIBB, RN as Oncology Nurse Navigator Cloretta Arley NOVAK, MD as Medical Oncologist (Oncology) Wonda Sharper, MD as Consulting Physician (Cardiology)   CHIEF COMPLAINT: Follow up pancreas cancer   CURRENT THERAPY: FOLFOX  INTERVAL HISTORY Mr. Peake returns for follow up. Last seen 09/09/24 with cycle 2 FOLFOX.   ROS   Past Medical History:  Diagnosis Date   Arthritis    CAD (coronary artery disease) 02/26/2018   A. Inf STEMI 6/18: LHC - pLCx 60, dRCA 100, EF 45-50 >> PCI:  DES to RCA // B. Echo 8/18: EF 60-65, nwm, grade 1 dd, MAC. C. 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   Hyperlipemia    intol to some statins   IBS (irritable bowel syndrome)    Medication intolerance    severe SOB due to Brilinta  >> changed to Plavix    Renal disorder    kidney stones   Seasonal allergies    Sinus congestion    chronic   Sleep apnea    uses a c-pap     Past Surgical History:  Procedure Laterality Date   COLONOSCOPY     CORONARY STENT INTERVENTION N/A 04/03/2017   Procedure: Coronary Stent Intervention;  Surgeon: Wonda Sharper, MD;  Location: [facility] INVASIVE CV LAB;  Service: Cardiovascular;  Laterality: N/A;   CORONARY STENT INTERVENTION N/A 02/27/2018   Procedure: CORONARY STENT INTERVENTION;  Surgeon: Burnard Debby LABOR, MD;  Location: [facility] INVASIVE CV LAB;  Service: Cardiovascular;  Laterality: N/A;   INCISION AND DRAINAGE  2011   infected finger   IR IMAGING GUIDED PORT INSERTION  01/15/2024   LEFT HEART CATH AND CORONARY ANGIOGRAPHY N/A 04/03/2017   Procedure: Left Heart Cath and Coronary Angiography;  Surgeon: Wonda Sharper, MD;  Location: [facility] INVASIVE CV LAB;  Service: Cardiovascular;  Laterality: N/A;   LEFT HEART  CATH AND CORONARY ANGIOGRAPHY N/A 02/27/2018   Procedure: LEFT HEART CATH AND CORONARY ANGIOGRAPHY;  Surgeon: Burnard Debby LABOR, MD;  Location: [facility] INVASIVE CV LAB;  Service: Cardiovascular;  Laterality: N/A;   SHOULDER ARTHROSCOPY WITH ROTATOR CUFF REPAIR AND SUBACROMIAL DECOMPRESSION Right 01/29/2013   Procedure: RIGHT SHOULDER ARTHROSCOPY WITH SUBACROMIAL DECOMPRESSION, THREE TENDON ROTATOR CUFF REPAIR;  Surgeon: Lamar LULLA Leonor Mickey., MD;  Location: [facility];  Service: Orthopedics;  Laterality: Right;   TOE DEBRIDEMENT     rt toe cyst     Outpatient Encounter Medications as of 09/23/2024  Medication Sig Note   allopurinol  (ZYLOPRIM ) 300 MG tablet Take 1 tablet (300 mg total) by mouth daily.    amLODipine  (NORVASC ) 5 MG tablet Take 1 tablet (5 mg total) by mouth daily.    apixaban  (ELIQUIS ) 5 MG TABS tablet Take 1 tablet (5 mg total) by mouth 2 (two) times daily.    aspirin  EC 81 MG tablet Take 81 mg by mouth daily.    Docusate Calcium  (STOOL SOFTENER PO) Take 1 tablet by mouth daily.    fluticasone  (FLONASE ) 50 MCG/ACT nasal spray Place 2 sprays into both nostrils See admin instructions. Instill 2 sprays into both nostrils in the morning and evening    HYDROcodone -acetaminophen  (NORCO/VICODIN) 5-325 MG tablet Take 1 tablet by mouth every 6 (six) hours as  needed for moderate pain (pain score 4-6). (Patient not taking: No sig reported)    Lactobacillus Rhamnosus, GG, (CULTURELLE) CAPS Take 1 capsule by mouth in the morning.    lidocaine  (LIDODERM ) 5 % Place 1 patch onto the skin daily. May wear up to 12 hours.    loratadine  (CLARITIN ) 10 MG tablet Take 10 mg by mouth See admin instructions. Take 10 mg by mouth in the morning and evening    Menthol-Methyl Salicylate (SALONPAS PAIN RELIEF PATCH) PTCH Apply 1 patch topically daily as needed (for pain). 12/09/2023: No patches are on at this time   Multiple Vitamin (MULTIVITAMIN WITH MINERALS) TABS Take 1 tablet by mouth daily with  breakfast.    nitroGLYCERIN  (NITROSTAT ) 0.4 MG SL tablet Place 1 tablet (0.4 mg total) under the tongue every 5 (five) minutes as needed for chest pain. X 3 doses    predniSONE  (DELTASONE ) 20 MG tablet Take 1 tablet (20 mg total) by mouth daily with breakfast.    PRESCRIPTION MEDICATION CPAP- At bedtime    Wheat Dextrin (BENEFIBER) POWD Take 4 g by mouth See admin instructions. Mix 4 grams (1 teaspoonful) into a desired beverage and drink in the morning    zolpidem  (AMBIEN ) 10 MG tablet Take 1 tablet (10 mg total) by mouth at bedtime as needed.    No facility-administered encounter medications on file as of 09/23/2024.     There were no vitals filed for this visit. There is no height or weight on file to calculate BMI.   ECOG PERFORMANCE STATUS: {CHL ONC ECOG PS:[id number]}  PHYSICAL EXAM GENERAL:alert, no distress and comfortable SKIN: no rash  EYES: sclera clear NECK: without mass LYMPH:  no palpable cervical or supraclavicular lymphadenopathy  LUNGS: clear with normal breathing effort HEART: regular rate & rhythm, no lower extremity edema ABDOMEN: abdomen soft, non-tender and normal bowel sounds NEURO: alert & oriented x 3 with fluent speech, no focal motor/sensory deficits Breast exam:  PAC without erythema    CBC    Latest Ref Rng & Units 09/14/2024    6:44 AM 09/13/2024   11:12 AM 09/09/2024    9:53 AM  CBC  WBC 4.0 - 10.5 K/uL 10.1  9.6  8.5   Hemoglobin 13.0 - 17.0 g/dL 87.5  85.9  86.8   Hematocrit 39.0 - 52.0 % 37.3  43.0  39.1   Platelets 150 - 400 K/uL 142  154  218       CMP     Latest Ref Rng & Units 09/14/2024    6:44 AM 09/13/2024   11:12 AM 09/09/2024    9:53 AM  CMP  Glucose 70 - 99 mg/dL 896  838  804   BUN 8 - 23 mg/dL 18  18  15    Creatinine 0.61 - 1.24 mg/dL 8.89  8.86  8.97   Sodium 135 - 145 mmol/L 140  142  140   Potassium 3.5 - 5.1 mmol/L 3.8  4.0  3.9   Chloride 98 - 111 mmol/L 104  107  104   CO2 22 - 32 mmol/L 25  27  23    Calcium  8.9 -  10.3 mg/dL 8.6  9.0  9.7   Total Protein 6.5 - 8.1 g/dL   6.0   Total Bilirubin 0.0 - 1.2 mg/dL   0.3   Alkaline Phos 38 - 126 U/L   61   AST 15 - 41 U/L   29   ALT 0 - 44 U/L   27  ASSESSMENT & PLAN: Pancreas cancer 11/19/2022: 2.9 cm thin-walled cystic lesion in the pancreas uncinate 07/28/2023: CT abdomen/pelvis: Stable 2.8 cm cystic area in the uncinate process 11/29/2023: CT chest-subsegmental and segmental pulmonary emboli in the left upper lobe, dilation of the right ventricle, multiple pulmonary nodules 12/26/2023: CA 19-9- 66,286 12/27/2023: PET-5 cm hypermetabolic pancreas head/uncinate mass, numerous hypermetabolic pulmonary nodules, hypermetabolic left supraclavicular and retroperitoneal lymph nodes 01/06/2024: Left supraclavicular lymph node biopsy-metastatic moderately differentiated adenocarcinoma, 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, upper GI, breast, and salivary gland carcinoma Foundation 1 01/06/2024-HRDsig negative, microsatellite stable, tumor mutation burden 2, K-ras G12R Cycle 1 gemcitabine /Abraxane  01/16/2024 Cycle 2 gemcitabine /Abraxane  01/30/2024 Cycle 3 gemcitabine /Abraxane  02/13/2024 Cycle 4 gemcitabine /Abraxane  02/27/2024 Cycle 5 gemcitabine /Abraxane  03/12/2024 03/23/2024 CTs: Decrease size of pancreas head/uncinate mass, mixed response involving bilateral pulmonary nodules, decreased left supraclavicular retroperitoneal lymph nodes Cycle 6 gemcitabine /Abraxane  03/27/2024 Cycle 7 gemcitabine /Abraxane  04/09/2024 Cycle 8 gemcitabine /Abraxane  04/23/2024 Cycle 9 gemcitabine /Abraxane  05/07/2024 Cycle 10 gemcitabine /Abraxane  05/28/2024 Cycle 11 gemcitabine /Abraxane  06/12/2024 CTs 06/23/2024-increase in size of numerous bilateral irregular pulmonary nodules.  Pancreas head mass is similar in size.  Decreased size of left supraclavicular and retroperitoneal lymph nodes.  Per review by Dr. Cloretta, overall stable. Cycle 12 gemcitabine /Abraxane  06/25/2024,  repeat noncontrast chest CT at a 6-week interval Cycle 13 gemcitabine /Abraxane  07/09/2024 Cycle 14 gemcitabine /Abraxane  07/23/2024 07/29/2024 CT chest: Progression of pulmonary metastases Cycle 1 FOLFOX 08/19/2024 Cycle 2 FOLFOX 09/09/2024 Pulmonary embolism 11/29/2023 Small segmental and subsegmental pulmonary emboli in the left upper lobe, dilation of right ventricle Admitted for heparin  anticoagulation followed by apixaban -discharged 12/01/2023 Lower extremity Dopplers 11/30/2023: Acute left posterior tibial, peroneal, and TP trunk DVTs, acute right peroneal DVT Lovenox  12/31/2023 Lovenox  discontinued, Eliquis  initiated 04/14/2024   3.  CVAs-visual disturbance and altered mental status 12/30/2023 MRI brain-many foci of restricted diffusion in the cerebellum, left frontal, left basal ganglia, and parietal lobes compatible with acute/subacute infarcts, consider embolic etiology 4.  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  Kidney stones 6.  Gout 7.  Family history of pancreas cancer 8.  History of basal cell carcinoma 9.  Negative genetic testing with VUS in EGFR.  First-degree relatives are candidates for considering pancreatic cancer screening based on family history.  Discussed with patient and wife 01/30/2024.     PLAN:  No orders of the defined types were placed in this encounter.     All questions were answered. The patient knows to call the clinic with any problems, questions or concerns. No barriers to learning were detected. I spent *** counseling the patient face to face. The total time spent in the appointment was *** and more than 50% was on counseling, review of test results, and coordination of care.   Lazette Estala K Shannel Zahm, NP 09/23/2024 6:29 AM

## 2024-09-24 ENCOUNTER — Inpatient Hospital Stay

## 2024-09-24 VITALS — BP 132/72 | HR 72 | Temp 97.8°F | Resp 18

## 2024-09-24 DIAGNOSIS — C250 Malignant neoplasm of head of pancreas: Secondary | ICD-10-CM

## 2024-09-24 DIAGNOSIS — Z5111 Encounter for antineoplastic chemotherapy: Secondary | ICD-10-CM | POA: Diagnosis not present

## 2024-09-24 LAB — CANCER ANTIGEN 19-9: CA 19-9: 6152 U/mL — ABNORMAL HIGH (ref 0–35)

## 2024-09-24 MED ORDER — OXALIPLATIN CHEMO INJECTION 100 MG/20ML
65.0000 mg/m2 | Freq: Once | INTRAVENOUS | Status: AC
  Administered 2024-09-24: 11:00:00 150 mg via INTRAVENOUS
  Filled 2024-09-24: qty 20

## 2024-09-24 MED ORDER — DEXTROSE 5 % IV SOLN: INTRAVENOUS | Status: DC

## 2024-09-24 MED ORDER — LEUCOVORIN CALCIUM INJECTION 350 MG
400.0000 mg/m2 | Freq: Once | INTRAMUSCULAR | Status: AC
  Administered 2024-09-24: 11:00:00 892 mg via INTRAVENOUS
  Filled 2024-09-24: qty 44.6

## 2024-09-24 MED ORDER — DEXAMETHASONE SOD PHOSPHATE PF 10 MG/ML IJ SOLN
10.0000 mg | Freq: Once | INTRAMUSCULAR | Status: AC
  Administered 2024-09-24: 10:00:00 10 mg via INTRAVENOUS

## 2024-09-24 MED ORDER — SODIUM CHLORIDE 0.9 % IV SOLN
2400.0000 mg/m2 | INTRAVENOUS | Status: DC
  Administered 2024-09-24: 14:00:00 5000 mg via INTRAVENOUS
  Filled 2024-09-24: qty 100

## 2024-09-24 MED ADMIN — Palonosetron HCl IV Soln 0.25 MG/5ML (Base Equivalent): 0.25 mg | INTRAVENOUS | @ 10:00:00 | NDC 60505619301

## 2024-09-24 MED FILL — Palonosetron HCl IV Soln 0.25 MG/5ML (Base Equivalent): 0.2500 mg | INTRAVENOUS | Qty: 5 | Status: AC

## 2024-09-24 NOTE — Patient Instructions (Signed)
 [facility] - A DEPT OF [place]  Discharge Instructions: Thank you for choosing [facility] to provide your oncology and hematology care.   If you have a lab appointment with the Cancer Center, please go directly to the Cancer Center and check in at the registration area.   Wear comfortable clothing and clothing appropriate for easy access to any Portacath or PICC line.   We strive to give you quality time with your provider. You may need to reschedule your appointment if you arrive late (15 or more minutes).  Arriving late affects you and other patients whose appointments are after yours.  Also, if you miss three or more appointments without notifying the office, you may be dismissed from the clinic at the provider's discretion.      For prescription refill requests, have your pharmacy contact our office and allow 72 hours for refills to be completed.    Today you received the following chemotherapy and/or immunotherapy agents Oxaliplatin, Leucovorin, and Fluorouracil.       To help prevent nausea and vomiting after your treatment, we encourage you to take your nausea medication as directed.  BELOW ARE SYMPTOMS THAT SHOULD BE REPORTED IMMEDIATELY: *FEVER GREATER THAN 100.4 F (38 C) OR HIGHER *CHILLS OR SWEATING *NAUSEA AND VOMITING THAT IS NOT CONTROLLED WITH YOUR NAUSEA MEDICATION *UNUSUAL SHORTNESS OF BREATH *UNUSUAL BRUISING OR BLEEDING *URINARY PROBLEMS (pain or burning when urinating, or frequent urination) *BOWEL PROBLEMS (unusual diarrhea, constipation, pain near the anus) TENDERNESS IN MOUTH AND THROAT WITH OR WITHOUT PRESENCE OF ULCERS (sore throat, sores in mouth, or a toothache) UNUSUAL RASH, SWELLING OR PAIN  UNUSUAL VAGINAL DISCHARGE OR ITCHING   Items with * indicate a potential emergency and should be followed up as soon as possible or go to the Emergency Department if any problems should occur.  Please show the  CHEMOTHERAPY ALERT CARD or IMMUNOTHERAPY ALERT CARD at check-in to the Emergency Department and triage nurse.  Should you have questions after your visit or need to cancel or reschedule your appointment, please contact [facility] - A DEPT OF [facility]  Dept: [phone]  and follow the prompts.  Office hours are 8:00 a.m. to 4:30 p.m. Monday - Friday. Please note that voicemails left after 4:00 p.m. may not be returned until the following business day.  We are closed weekends and major holidays. You have access to a nurse at all times for urgent questions. Please call the main number to the clinic Dept: [phone] and follow the prompts.   For any non-urgent questions, you may also contact your provider using MyChart. We now offer e-Visits for anyone 78 and older to request care online for non-urgent symptoms. For details visit [email or web address].   Also download the MyChart app! Go to the app store, search MyChart, open the app, select , and log in with your MyChart username and password.

## 2024-09-25 ENCOUNTER — Other Ambulatory Visit (HOSPITAL_BASED_OUTPATIENT_CLINIC_OR_DEPARTMENT_OTHER): Payer: Self-pay

## 2024-09-25 ENCOUNTER — Encounter: Payer: Self-pay | Admitting: Oncology

## 2024-09-25 MED ORDER — NEOMYCIN-POLYMYXIN-DEXAMETH 3.5-10000-0.1 OP SUSP
1.0000 [drp] | Freq: Four times a day (QID) | OPHTHALMIC | 0 refills | Status: DC
  Filled 2024-09-25: qty 5, 25d supply, fill #0

## 2024-09-26 ENCOUNTER — Inpatient Hospital Stay

## 2024-10-01 ENCOUNTER — Other Ambulatory Visit: Payer: Self-pay | Admitting: Oncology

## 2024-10-02 ENCOUNTER — Encounter: Payer: Self-pay | Admitting: Oncology

## 2024-10-04 ENCOUNTER — Other Ambulatory Visit: Payer: Self-pay | Admitting: Oncology

## 2024-10-04 NOTE — Progress Notes (Unsigned)
 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    Timothy Wilcox establish care after inferior STEMI 04/03/2017.  DES placed to RCA, 60% LCx stenosis noted.  Cath estimated LVEF 45 to 50%.  Had dyspnea with Brilinta , was switched to Plavix .  Repeat echo for dyspnea on 05/22/2017 showed LVEF 60-65%, G1DD, mildly calcified MV, trivial TR.  Admitted 02/2018 for continued dyspnea, LHC 02/27/2018 with patent RCA DES but 80% LCx stenosis, for which a DES was placed.  D-dimer negative.  Discharged and readmitted the next month for continued dyspnea, chest CTA negative for PE.  Pulmonology noted bronchial wall thickening.  Toprol -XL stopped. ETT 04/09/2018 normal.  Regular exercise and weight loss recommended.  Allergist did testing, demonstrated findings consistent with asthma.  Plavix  eventually discontinued with improvement in dyspnea.  Presented for acute cardiology visit 07/18/2023 for new onset dyspnea.  Stress test 07/30/2023 low risk.  Symptoms resolved at 10/31/2023 visit.  Unfortunately admitted 11/29/2023 with chest pain.  Subsequently found to have a small subsegmental PE and bilateral LE DVTs.  Pulmonary nodules also noted.  Echo 11/30/2023 showed LVEF 60-65%, G1DD, normal RV, tricuspid AV with moderate calcification and thickening. Discharged 12/01/2023.    Admitted again 12/09/2023 with similar symptoms, CTA negative for PE, but increased size of pulmonary nodules and fullness in the head of the pancreas were noted.  He was referred to pulmonology and oncology.   He was diagnosed with metastatic pancreatic cancer and began treatment.  Presented to the ER again 09/13/2024 with chest pain.  Troponins elevated, EKG without acute ischemia.  CTA negative for PE, new and enlarging pulmonary nodules as well as liver lesion noted.  Ultimately ruled in for NSTEMI.  Declined cardiac catheterization and additional medical therapy.  Echo showed LVEF 60-65%, G1DD. He was discharged 09/14/2024.  Follow-up with oncology, discussed cardiotoxicity associated with his current chemotherapy treatment.    History of Present Illness: Today he is struggling with chronic dyspnea and fatigue. After starting Imdur  post-hospitalization, he notes that he felt worse than he does after chemotherapy and he discontinued this 2-3 days ago. Main complaints are brain fog, dyspnea, and double vision, for which he attributes to his cancer. Has been unable to drive for the past 6 months due to this. His oncologist Dr. Cloretta has given him a prognosis of 2-6 months with chemotherapy and less without it. He does have nose bleeds rather often but he avoids blowing his nose to prevent them.   He has specific questions surrounding what to do if he feels chest pain again that made him present to the ER prior to NSTEMI 09/13/24, what his prognosis is from a cardiac standpoint, and if his next MI could potentially take him out. He wishes to discuss these concerns with Dr. Fell and values his opinion on these matters. His next chemotherapy is scheduled for 10/07/24 and he will likely proceed with this one, but requests to speak with Dr. Fell prior to the next treatment which would be 10/21/24.  ROS: Please see the history of  present illness. All other systems reviewed and are negative.   Studies Reviewed: The following studies were personally reviewed today:     Cardiac Studies & Procedures   ______________________________________________________________________________________________ CARDIAC  CATHETERIZATION  CARDIAC CATHETERIZATION 02/27/2018  Conclusion  Previously placed Dist RCA stent (unknown type) is widely patent.  Prox Cx lesion is 80% stenosed.  Post intervention, there is a 0% residual stenosis.  The left ventricular systolic function is normal.  A stent was successfully placed.  Preserved to low normal LV function with minimal residual mid to basal inferior hypocontractility and an ejection fraction of 50%.  LVEDP 10 mmHg.  Coronary obstructive disease with a normal LAD, normal ramus intermediate vessel, progressive 80% proximal left circumflex stenosis, and widely patent previously placed distal RCA stent at the site of prior inferior STEMI.  Successful PCI to the 80% left circumflex stenosis with the patient participating in the Optimize Study stent protocol with ultimate insertion of a 3.0 x 13 mm sirolimus DES stent postdilated to 3.31 mm with the 80% stenosis being reduced to 0%.  RECOMMENDATION: Continue DAPT for at least a year.  Medical therapy.  The patient has intolerance to statins.  Consider Zetia.  Findings Coronary Findings Diagnostic  Dominance: Right  Left Circumflex Prox Cx lesion is 80% stenosed.  Right Coronary Artery Previously placed Dist RCA stent (unknown type) is widely patent.  Intervention  Prox Cx lesion Stent A stent was successfully placed. Post-Intervention Lesion Assessment The intervention was successful. Pre-interventional TIMI flow is 3. Post-intervention TIMI flow is 3. No complications occurred at this lesion. There is a 0% residual stenosis post intervention.   CARDIAC CATHETERIZATION  CARDIAC CATHETERIZATION 04/03/2017  Conclusion 1. Acute inferoposterior STEMI treated successfully with primary PCI using a drug-eluting stent extending from the distal RCA into the posterior AV segment 2. Moderate left circumflex stenosis 3. Widely patent left main and LAD with minimal nonobstructive disease 4. Mild to  moderate segmental LV dysfunction with severe hypokinesis of the inferior wall, LVEF estimated at 45-50%  Recommend:  Aspirin  and brilinta  12 months. Brilinta  180 mg administered in the Cath Lab  Aggrastat  6 hours  If no complications arise consider hospital discharge within 48 hours  Aggressive risk reduction measures. Unfortunately patient is statin allergic  Findings Coronary Findings Diagnostic  Dominance: Right  Left Anterior Descending The vessel exhibits minimal luminal irregularities.  Left Circumflex The lesion is concentric.  Right Coronary Artery  Intervention  Dist RCA lesion Angioplasty Lesion crossed with guidewire. Pre-stent angioplasty was performed. A STENT PROMUS PREM MR 2.75X16 drug eluting stent was successfully placed. Post-stent angioplasty was performed using a BALLOON SAPPHIRE Aline 3.0X10. Maximum pressure: 18 atm. There is no pre-interventional antegrade distal flow (TIMI 0).  The post-interventional distal flow is normal (TIMI 3). The intervention was successful . No complications occurred at this lesion. The patient has thrombotic occlusion of the distal RCA extending into the distal RCA bifurcation. Heparin  and Aggrastat  are both used for anticoagulation. A therapeutic ACT greater than 300 seconds is achieved. Initially, a cougar wire is advanced across the occlusion into a small PDA branch. That lesion is predilated with a 2.0 mm balloon. At that point it was apparent there was a larger twin PDA branch as well as the PLA branch supplied by this territory. A whisper wire is used and advanced into the more dominant PDA. That lesion is predilated with the same 2.0 mm balloon. This restored TIMI-3 flow into all branches. The lesion is then stented with a  2.75 x 16 mm Promus DES deployed at 14 atm. The stent is postdilated with a 3.0 mm noncompliant balloon to 18 atm. There was 0% residual stenosis with TIMI-3 flow in all 3 branches. There is a 0% residual  stenosis post intervention.   STRESS TESTS  MYOCARDIAL PERFUSION IMAGING 07/30/2023  Interpretation Summary   LV perfusion is normal. There is no evidence of ischemia. There is no evidence of infarction. Reduced counts in the inferior segments on rest/stress imaging with normal wall motion consistent with diaphragm attenuation.   Left ventricular function is normal. Nuclear stress EF: 64%. The left ventricular ejection fraction is normal (55-65%). End diastolic cavity size is normal.   Average exercise capacity (4:00 min:s; 4.6 METS). Normal HR/BP response to exercise.   The study is normal. The study is low risk.   ECHOCARDIOGRAM  ECHOCARDIOGRAM COMPLETE 09/14/2024  Narrative ECHOCARDIOGRAM REPORT    Patient Name:   Timothy Wilcox Date of Exam: 09/14/2024 Medical Rec #:  [id number]          Height:       66.0 in Accession #:    [id number]         Weight:       230.7 lb Date of Birth:  11/16/1940          BSA:          2.125 m Patient Age:    83 years           BP:           114/70 mmHg Patient Gender: M                  HR:           54 bpm. Exam Location:  Inpatient  Procedure: 2D Echo, Cardiac Doppler, Color Doppler and Intracardiac Opacification Agent (Both Spectral and Color Flow Doppler were utilized during procedure).  Indications:    NSTEMI  History:        Patient has prior history of Echocardiogram examinations, most recent 11/30/2023. CAD and Acute MI, Signs/Symptoms:Dyspnea; Risk Factors:Hypertension, Dyslipidemia and Sleep Apnea.  Sonographer:    Sherlean Dubin Sonographer#2:  Carmelita Hartshorn RDCS, FE, PE Referring Phys: [id number] MARSA NOVAK MELVIN  IMPRESSIONS   1. Left ventricular ejection fraction, by estimation, is 60 to 65%. The left ventricle has normal function. The left ventricle has no regional wall motion abnormalities. Left ventricular diastolic parameters are consistent with Grade I diastolic dysfunction (impaired relaxation). 2. Right  ventricular systolic function is normal. The right ventricular size is normal. Tricuspid regurgitation signal is inadequate for assessing PA pressure. 3. The mitral valve is grossly normal. No evidence of mitral valve regurgitation. 4. The aortic valve is tricuspid. Aortic valve regurgitation is trivial. Aortic valve sclerosis/calcification is present, without any evidence of aortic stenosis. 5. The inferior vena cava is normal in size with <50% respiratory variability, suggesting right atrial pressure of 8 mmHg.  Comparison(s): Changes from prior study are noted. 11/30/2023: LVEF 60-65%.  FINDINGS Left Ventricle: Left ventricular ejection fraction, by estimation, is 60 to 65%. The left ventricle has normal function. The left ventricle has no regional wall motion abnormalities. The left ventricular internal cavity size was normal in size. There is no left ventricular hypertrophy. Left ventricular diastolic parameters are consistent with Grade I diastolic dysfunction (impaired relaxation). Indeterminate filling pressures.  Right Ventricle: The right ventricular size is normal. No increase in right ventricular wall thickness. Right ventricular systolic function is normal. Tricuspid regurgitation  signal is inadequate for assessing PA pressure.  Left Atrium: Left atrial size was normal in size.  Right Atrium: Right atrial size was normal in size.  Pericardium: There is no evidence of pericardial effusion.  Mitral Valve: The mitral valve is grossly normal. No evidence of mitral valve regurgitation.  Tricuspid Valve: The tricuspid valve is grossly normal. Tricuspid valve regurgitation is trivial.  Aortic Valve: The aortic valve is tricuspid. Aortic valve regurgitation is trivial. Aortic valve sclerosis/calcification is present, without any evidence of aortic stenosis. Aortic valve mean gradient measures 3.0 mmHg. Aortic valve peak gradient measures 6.2 mmHg. Aortic valve area, by VTI measures 2.62  cm.  Pulmonic Valve: The pulmonic valve was normal in structure. Pulmonic valve regurgitation is not visualized.  Aorta: The aortic root and ascending aorta are structurally normal, with no evidence of dilitation.  Venous: The inferior vena cava is normal in size with less than 50% respiratory variability, suggesting right atrial pressure of 8 mmHg.  IAS/Shunts: No atrial level shunt detected by color flow Doppler.   LEFT VENTRICLE PLAX 2D LVIDd:         5.10 cm   Diastology LVIDs:         3.50 cm   LV e' medial:    5.00 cm/s LV PW:         1.10 cm   LV E/e' medial:  22.2 LV IVS:        0.97 cm   LV e' lateral:   5.96 cm/s LVOT diam:     2.00 cm   LV E/e' lateral: 18.6 LV SV:         72 LV SV Index:   34 LVOT Area:     3.14 cm   RIGHT VENTRICLE RV S prime:     12.20 cm/s TAPSE (M-mode): 2.0 cm  LEFT ATRIUM             Index        RIGHT ATRIUM           Index LA diam:        4.70 cm 2.21 cm/m   RA Area:     19.30 cm LA Vol (A2C):   65.4 ml 30.78 ml/m  RA Volume:   49.70 ml  23.39 ml/m LA Vol (A4C):   66.1 ml 31.11 ml/m LA Biplane Vol: 70.9 ml 33.37 ml/m AORTIC VALVE AV Area (Vmax):    2.84 cm AV Area (Vmean):   2.62 cm AV Area (VTI):     2.62 cm AV Vmax:           124.00 cm/s AV Vmean:          84.300 cm/s AV VTI:            0.275 m AV Peak Grad:      6.2 mmHg AV Mean Grad:      3.0 mmHg LVOT Vmax:         112.00 cm/s LVOT Vmean:        70.200 cm/s LVOT VTI:          0.229 m LVOT/AV VTI ratio: 0.83  AORTA Ao Root diam: 3.10 cm Ao Asc diam:  3.10 cm  MITRAL VALVE MV Area (PHT): 3.31 cm     SHUNTS MV Decel Time: 229 msec     Systemic VTI:  0.23 m MV E velocity: 111.00 cm/s  Systemic Diam: 2.00 cm MV A velocity: 131.00 cm/s MV E/A ratio:  0.85  Vinie Maxcy MD  Electronically signed by Vinie Maxcy MD Signature Date/Time: 09/14/2024/3:46:21 PM    Final           ______________________________________________________________________________________________             Physical Exam: VS: BP 130/68 (BP Location: Left Arm, Patient Position: Sitting, Cuff Size: Large)   Pulse 94   Ht 5' 6 (1.676 m)   Wt 236 lb (107 kg)   SpO2 97%   BMI 38.09 kg/m   GEN: Dyspneic, well nourished, pleasant HEENT: Normal NECK: No JVD CARDIAC: RRR, no murmurs, rubs, gallops RESPIRATORY: Diminished to auscultation bilaterally ABDOMEN: Soft, non-tender, non-distended MUSCULOSKELETAL: No edema SKIN: Warm and dry NEUROLOGIC:  Alert and oriented x 3 PSYCHIATRIC:  Normal affect   Assessment & Plan: 1. CAD: STEMI in 2018 with DES to RCA.  Scheduled LHC in 2019 with DES to LCx.  NSTEMI 09/13/2024, cardiac catheterization declined in the setting of metastatic pancreatic cancer. Has not had recurrent chest pain since hospitalization. Did not tolerate Imdur .  - He had very specific questions around possible future MIs. I discussed with him that if he plans to defer cardiac catheterization again, I would recommend that if he has recurrent chest pain, he can take SL nitroglycerin  every 5 minutes for 3 doses and then can present to the ER for further pain management if the pain does not subside. He was agreeable to this. - Continue aspirin  81 mg daily - Continue as needed SL nitroglycerin   2. Metastatic pancreatic cancer: He is currently weighing options regarding continuation of chemotherapy regimen. He has specific questions regarding recurrent chest pain and values Dr. Margurite opinion on these matters. - He plans to proceed with chemotherapy treatment 10/07/24 but would appreciate Dr. Margurite opinion prior to 10/21/24 treatment. I have contacted Dr. Wonda regarding this and one of us  will be in touch with the patient.  3. Hypertension: BP today 130/68. Well-controlled on current regimen.  - Continue amlodipine  5 mg daily  4. Hyperlipidemia: 09/14/2024 LDL 70, HDL 77,  TGs 145, total 176.  09/23/2024 AST 30, ALT 25.  Had previously tolerated low-dose rosuvastatin , declines for now. - Continue lifestyle modifications with low-fat, high-fiber diet and activity as tolerated  5. History of PEs/DVTs: He initially thought he had a recurrent PE when he presented to the ER 09/13/24, but was found to have NSTEMI instead. CTA chest was negative for PE but did show new and enlarging pulmonary nodules. He is chronically dyspneic.  - Continue Eliquis  5 mg BID   Dispo: Follow-up with Dr. Wonda in 6 months or sooner if needed.  Signed, Saddie GORMAN Cleaves, NP 10/05/2024 3:25 PM [facility] "

## 2024-10-05 ENCOUNTER — Other Ambulatory Visit (HOSPITAL_BASED_OUTPATIENT_CLINIC_OR_DEPARTMENT_OTHER): Payer: Self-pay

## 2024-10-05 ENCOUNTER — Ambulatory Visit

## 2024-10-05 ENCOUNTER — Encounter: Payer: Self-pay | Admitting: Physician Assistant

## 2024-10-05 ENCOUNTER — Encounter: Payer: Self-pay | Admitting: Oncology

## 2024-10-05 VITALS — BP 130/68 | HR 94 | Ht 66.0 in | Wt 236.0 lb

## 2024-10-05 DIAGNOSIS — Z86711 Personal history of pulmonary embolism: Secondary | ICD-10-CM

## 2024-10-05 DIAGNOSIS — I2510 Atherosclerotic heart disease of native coronary artery without angina pectoris: Secondary | ICD-10-CM

## 2024-10-05 DIAGNOSIS — I10 Essential (primary) hypertension: Secondary | ICD-10-CM

## 2024-10-05 DIAGNOSIS — C259 Malignant neoplasm of pancreas, unspecified: Secondary | ICD-10-CM

## 2024-10-05 DIAGNOSIS — E782 Mixed hyperlipidemia: Secondary | ICD-10-CM

## 2024-10-05 DIAGNOSIS — C7800 Secondary malignant neoplasm of unspecified lung: Secondary | ICD-10-CM

## 2024-10-05 MED ORDER — ZOLPIDEM TARTRATE 10 MG PO TABS: 10.0000 mg | ORAL_TABLET | Freq: Every evening | ORAL | 1 refills | Status: DC | PRN

## 2024-10-05 MED ORDER — ZOLPIDEM TARTRATE 10 MG PO TABS
10.0000 mg | ORAL_TABLET | Freq: Every evening | ORAL | 1 refills | Status: DC
  Filled 2024-10-05: qty 30, 30d supply, fill #0

## 2024-10-05 NOTE — Patient Instructions (Addendum)
 Medication Instructions:  NO CHANGES  Lab Work: NONE TO BE DONE TODAY.  Testing/Procedures: NONE  Follow-Up: At [facility], you and your health needs are our priority.  As part of our continuing mission to provide you with exceptional heart care, our providers are all part of one team.  This team includes your primary Cardiologist (physician) and Advanced Practice Providers or APPs (Physician Assistants and Nurse Practitioners) who all work together to provide you with the care you need, when you need it.  Your next appointment:   6 MONTHS  Provider:   Ozell Fell, MD

## 2024-10-06 ENCOUNTER — Other Ambulatory Visit (HOSPITAL_BASED_OUTPATIENT_CLINIC_OR_DEPARTMENT_OTHER): Payer: Self-pay

## 2024-10-06 MED ORDER — SODIUM CHLORIDE 0.9 % IV SOLN
2400.0000 mg/m2 | INTRAVENOUS | Status: DC
  Administered 2024-10-07: 5000 mg via INTRAVENOUS
  Filled 2024-10-06: qty 100

## 2024-10-06 MED ORDER — ALLOPURINOL 300 MG PO TABS
300.0000 mg | ORAL_TABLET | Freq: Every day | ORAL | 1 refills | Status: AC
  Filled 2024-10-06: qty 90, 90d supply, fill #0

## 2024-10-07 ENCOUNTER — Inpatient Hospital Stay: Admitting: Nurse Practitioner

## 2024-10-07 ENCOUNTER — Inpatient Hospital Stay

## 2024-10-07 ENCOUNTER — Encounter: Payer: Self-pay | Admitting: Nurse Practitioner

## 2024-10-07 VITALS — BP 150/83 | HR 79 | Temp 97.9°F | Resp 20

## 2024-10-07 VITALS — BP 138/73 | HR 79 | Temp 97.5°F | Resp 20 | Wt 236.2 lb

## 2024-10-07 DIAGNOSIS — C250 Malignant neoplasm of head of pancreas: Secondary | ICD-10-CM | POA: Diagnosis not present

## 2024-10-07 DIAGNOSIS — Z5111 Encounter for antineoplastic chemotherapy: Secondary | ICD-10-CM | POA: Diagnosis not present

## 2024-10-07 LAB — CBC WITH DIFFERENTIAL (CANCER CENTER ONLY)
Abs Immature Granulocytes: 0.07 K/uL (ref 0.00–0.07)
Basophils Absolute: 0.1 K/uL (ref 0.0–0.1)
Basophils Relative: 1 %
Eosinophils Absolute: 0.1 K/uL (ref 0.0–0.5)
Eosinophils Relative: 1 %
HCT: 38.7 % — ABNORMAL LOW (ref 39.0–52.0)
Hemoglobin: 12.9 g/dL — ABNORMAL LOW (ref 13.0–17.0)
Immature Granulocytes: 1 %
Lymphocytes Relative: 15 %
Lymphs Abs: 1.4 K/uL (ref 0.7–4.0)
MCH: 32.3 pg (ref 26.0–34.0)
MCHC: 33.3 g/dL (ref 30.0–36.0)
MCV: 96.8 fL (ref 80.0–100.0)
Monocytes Absolute: 0.8 K/uL (ref 0.1–1.0)
Monocytes Relative: 9 %
Neutro Abs: 7.3 K/uL (ref 1.7–7.7)
Neutrophils Relative %: 73 %
Platelet Count: 103 K/uL — ABNORMAL LOW (ref 150–400)
RBC: 4 MIL/uL — ABNORMAL LOW (ref 4.22–5.81)
RDW: 15.5 % (ref 11.5–15.5)
WBC Count: 9.8 K/uL (ref 4.0–10.5)
nRBC: 0 % (ref 0.0–0.2)

## 2024-10-07 LAB — CMP (CANCER CENTER ONLY)
ALT: 28 U/L (ref 0–44)
AST: 30 U/L (ref 15–41)
Albumin: 3.7 g/dL (ref 3.5–5.0)
Alkaline Phosphatase: 77 U/L (ref 38–126)
Anion gap: 13 (ref 5–15)
BUN: 15 mg/dL (ref 8–23)
CO2: 22 mmol/L (ref 22–32)
Calcium: 9.5 mg/dL (ref 8.9–10.3)
Chloride: 105 mmol/L (ref 98–111)
Creatinine: 0.97 mg/dL (ref 0.61–1.24)
GFR, Estimated: >60 mL/min
Glucose, Bld: 191 mg/dL — ABNORMAL HIGH (ref 70–99)
Potassium: 3.6 mmol/L (ref 3.5–5.1)
Sodium: 140 mmol/L (ref 135–145)
Total Bilirubin: 0.4 mg/dL (ref 0.0–1.2)
Total Protein: 6 g/dL — ABNORMAL LOW (ref 6.5–8.1)

## 2024-10-07 MED ORDER — LEUCOVORIN CALCIUM INJECTION 350 MG
400.0000 mg/m2 | Freq: Once | INTRAVENOUS | Status: AC
  Administered 2024-10-07: 892 mg via INTRAVENOUS
  Filled 2024-10-07: qty 44.6

## 2024-10-07 MED ORDER — PALONOSETRON HCL INJECTION 0.25 MG/5ML
0.2500 mg | Freq: Once | INTRAVENOUS | Status: AC
  Administered 2024-10-07: 0.25 mg via INTRAVENOUS
  Filled 2024-10-07: qty 5

## 2024-10-07 MED ORDER — DEXAMETHASONE SOD PHOSPHATE PF 10 MG/ML IJ SOLN
10.0000 mg | Freq: Once | INTRAMUSCULAR | Status: AC
  Administered 2024-10-07: 10 mg via INTRAVENOUS

## 2024-10-07 MED ORDER — DEXTROSE 5 % IV SOLN: INTRAVENOUS | Status: DC

## 2024-10-07 MED ORDER — OXALIPLATIN CHEMO INJECTION 100 MG/20ML
65.0000 mg/m2 | Freq: Once | INTRAVENOUS | Status: AC
  Administered 2024-10-07: 150 mg via INTRAVENOUS
  Filled 2024-10-07: qty 20

## 2024-10-07 NOTE — Patient Instructions (Signed)
 [facility] - A DEPT OF [facility]  Discharge Instructions: Thank you for choosing [facility] to provide your oncology and hematology care.   If you have a lab appointment with the Cancer Center, please go directly to the Cancer Center and check in at the registration area.   Wear comfortable clothing and clothing appropriate for easy access to any Portacath or PICC line.   We strive to give you quality time with your provider. You may need to reschedule your appointment if you arrive late (15 or more minutes).  Arriving late affects you and other patients whose appointments are after yours.  Also, if you miss three or more appointments without notifying the office, you may be dismissed from the clinic at the provider's discretion.      For prescription refill requests, have your pharmacy contact our office and allow 72 hours for refills to be completed.    Today you received the following chemotherapy and/or immunotherapy agents: oxaliplatin , leucovorin , fluorouracil        To help prevent nausea and vomiting after your treatment, we encourage you to take your nausea medication as directed.  BELOW ARE SYMPTOMS THAT SHOULD BE REPORTED IMMEDIATELY: *FEVER GREATER THAN 100.4 F (38 C) OR HIGHER *CHILLS OR SWEATING *NAUSEA AND VOMITING THAT IS NOT CONTROLLED WITH YOUR NAUSEA MEDICATION *UNUSUAL SHORTNESS OF BREATH *UNUSUAL BRUISING OR BLEEDING *URINARY PROBLEMS (pain or burning when urinating, or frequent urination) *BOWEL PROBLEMS (unusual diarrhea, constipation, pain near the anus) TENDERNESS IN MOUTH AND THROAT WITH OR WITHOUT PRESENCE OF ULCERS (sore throat, sores in mouth, or a toothache) UNUSUAL RASH, SWELLING OR PAIN  UNUSUAL VAGINAL DISCHARGE OR ITCHING   Items with * indicate a potential emergency and should be followed up as soon as possible or go to the Emergency Department if any problems should occur.  Please show the CHEMOTHERAPY  ALERT CARD or IMMUNOTHERAPY ALERT CARD at check-in to the Emergency Department and triage nurse.  Should you have questions after your visit or need to cancel or reschedule your appointment, please contact [facility] - A DEPT OF [facility]  Dept: [phone]  and follow the prompts.  Office hours are 8:00 a.m. to 4:30 p.m. Monday - Friday. Please note that voicemails left after 4:00 p.m. may not be returned until the following business day.  We are closed weekends and major holidays. You have access to a nurse at all times for urgent questions. Please call the main number to the clinic Dept: [phone] and follow the prompts.   For any non-urgent questions, you may also contact your provider using MyChart. We now offer e-Visits for anyone 22 and older to request care online for non-urgent symptoms. For details visit [email or web address].   Also download the MyChart app! Go to the app store, search MyChart, open the app, select [facility], and log in with your MyChart username and password.

## 2024-10-07 NOTE — Patient Instructions (Signed)

## 2024-10-07 NOTE — Progress Notes (Signed)
 " [facility] OFFICE PROGRESS NOTE   Diagnosis: Pancreas cancer  INTERVAL HISTORY:   Mr. Wilcox returns as scheduled.  He completed cycle 3 FOLFOX 09/24/2024.  He denies nausea/vomiting.  No mouth sores.  No diarrhea.  Distribution of numbness in the upper and lower extremities is unchanged, severity is less.  No chest pain following cycle 3.  Stable dyspnea.  Cough is better.  Objective:  Vital signs in last 24 hours:  Blood pressure (!) 140/73, pulse 79, temperature (!) 97.5 F (36.4 C), temperature source Temporal, resp. rate 20, weight 236 lb 3.2 oz (107.1 kg), SpO2 100%.    HEENT: No thrush or ulcers. Resp: Faint rhonchi lower lung field bilaterally.  No respiratory distress. Cardio: Regular rate and rhythm. GI: No hepatosplenomegaly. Vascular: Trace lower leg edema bilaterally. Neuro: Vibratory sense minimally decreased over the fingertips per tuning fork exam. Skin: Palms without erythema. Port-A-Cath without erythema.  Lab Results:  Lab Results  Component Value Date   WBC 9.8 10/07/2024   HGB 12.9 (L) 10/07/2024   HCT 38.7 (L) 10/07/2024   MCV 96.8 10/07/2024   PLT 103 (L) 10/07/2024   NEUTROABS 7.3 10/07/2024    Imaging:  No results found.  Medications: I have reviewed the patient's current medications.  Assessment/Plan: Pancreas cancer 11/19/2022: 2.9 cm thin-walled cystic lesion in the pancreas uncinate 07/28/2023: CT abdomen/pelvis: Stable 2.8 cm cystic area in the uncinate process 11/29/2023: CT chest-subsegmental and segmental pulmonary emboli in the left upper lobe, dilation of the right ventricle, multiple pulmonary nodules 12/26/2023: CA 19-9- 66,286 12/27/2023: PET-5 cm hypermetabolic pancreas head/uncinate mass, numerous hypermetabolic pulmonary nodules, hypermetabolic left supraclavicular and retroperitoneal lymph nodes 01/06/2024: Left supraclavicular lymph node biopsy-metastatic moderately differentiated adenocarcinoma,  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, upper GI, breast, and salivary gland carcinoma Foundation 1 01/06/2024-HRDsig negative, microsatellite stable, tumor mutation burden 2, K-ras G12R Cycle 1 gemcitabine /Abraxane  01/16/2024 Cycle 2 gemcitabine /Abraxane  01/30/2024 Cycle 3 gemcitabine /Abraxane  02/13/2024 Cycle 4 gemcitabine /Abraxane  02/27/2024 Cycle 5 gemcitabine /Abraxane  03/12/2024 03/23/2024 CTs: Decrease size of pancreas head/uncinate mass, mixed response involving bilateral pulmonary nodules, decreased left supraclavicular retroperitoneal lymph nodes Cycle 6 gemcitabine /Abraxane  03/27/2024 Cycle 7 gemcitabine /Abraxane  04/09/2024 Cycle 8 gemcitabine /Abraxane  04/23/2024 Cycle 9 gemcitabine /Abraxane  05/07/2024 Cycle 10 gemcitabine /Abraxane  05/28/2024 Cycle 11 gemcitabine /Abraxane  06/12/2024 CTs 06/23/2024-increase in size of numerous bilateral irregular pulmonary nodules.  Pancreas head mass is similar in size.  Decreased size of left supraclavicular and retroperitoneal lymph nodes.  Per review by Dr. Cloretta, overall stable. Cycle 12 gemcitabine /Abraxane  06/25/2024, repeat noncontrast chest CT at a 6-week interval Cycle 13 gemcitabine /Abraxane  07/09/2024 Cycle 14 gemcitabine /Abraxane  07/23/2024 07/29/2024 CT chest: Progression of pulmonary metastases Cycle 1 FOLFOX 08/19/2024 Cycle 2 FOLFOX 09/09/2024 Cycle 3 FOLFOX 09/24/2024 Cycle 4 FOLFOX 10/07/2024 Pulmonary embolism 11/29/2023 Small segmental and subsegmental pulmonary emboli in the left upper lobe, dilation of right ventricle Admitted for heparin  anticoagulation followed by apixaban -discharged 12/01/2023 Lower extremity Dopplers 11/30/2023: Acute left posterior tibial, peroneal, and TP trunk DVTs, acute right peroneal DVT Lovenox  12/31/2023 Lovenox  discontinued, Eliquis  initiated 04/14/2024   3.  CVAs-visual disturbance and altered mental status 12/30/2023 MRI brain-many foci of restricted diffusion in the cerebellum, left  frontal, left basal ganglia, and parietal lobes compatible with acute/subacute infarcts, consider embolic etiology 4.  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  Kidney stones 6.  Gout 7.  Family history of pancreas cancer 8.  History of basal cell carcinoma 9.  Negative genetic testing with VUS in EGFR.  First-degree relatives are candidates for considering pancreatic  cancer screening based on family history.  Discussed with patient and wife 01/30/2024.   10. NSTEMI 09/13/2024    Disposition: Timothy Wilcox appears unchanged.  He has completed 3 cycles of FOLFOX.  Plan to proceed with cycle 4 today as scheduled.  CBC and chemistry panel reviewed.  Labs are adequate for treatment.  He has mild thrombocytopenia.  He understands to contact the office with bleeding.  He would like to have another CA 19-9 checked 2 days before his next treatment.  He plans to utilize this information to make decisions regarding future treatments.  He will return for a CA 19-9 on 10/19/2024.  He will return for follow-up and the next cycle of FOLFOX in 2 weeks, check CA 19-9 2 days prior.  We are available to see him sooner if needed.    [name] ANP/GNP-BC   10/07/2024  10:32 AM        "

## 2024-10-07 NOTE — Progress Notes (Signed)
 Patient seen by [name] NP today  Vitals are within treatment parameters:Yes   Labs are within treatment parameters: Yes   Treatment plan has been signed: Yes   Per physician team, Patient is ready for treatment and there are NO modifications to the treatment plan.

## 2024-10-08 LAB — CANCER ANTIGEN 19-9: CA 19-9: 7986 U/mL — ABNORMAL HIGH (ref 0–35)

## 2024-10-09 ENCOUNTER — Other Ambulatory Visit: Payer: Self-pay

## 2024-10-09 ENCOUNTER — Inpatient Hospital Stay

## 2024-10-09 ENCOUNTER — Inpatient Hospital Stay: Attending: Oncology

## 2024-10-09 VITALS — BP 146/78 | HR 85 | Temp 97.8°F | Resp 18

## 2024-10-09 DIAGNOSIS — C250 Malignant neoplasm of head of pancreas: Secondary | ICD-10-CM | POA: Diagnosis present

## 2024-10-09 DIAGNOSIS — C770 Secondary and unspecified malignant neoplasm of lymph nodes of head, face and neck: Secondary | ICD-10-CM | POA: Diagnosis present

## 2024-10-09 DIAGNOSIS — G629 Polyneuropathy, unspecified: Secondary | ICD-10-CM | POA: Insufficient documentation

## 2024-10-09 DIAGNOSIS — Z800 Family history of malignant neoplasm of digestive organs: Secondary | ICD-10-CM | POA: Insufficient documentation

## 2024-10-09 DIAGNOSIS — C7800 Secondary malignant neoplasm of unspecified lung: Secondary | ICD-10-CM | POA: Insufficient documentation

## 2024-10-09 DIAGNOSIS — R5383 Other fatigue: Secondary | ICD-10-CM | POA: Diagnosis not present

## 2024-10-09 DIAGNOSIS — R978 Other abnormal tumor markers: Secondary | ICD-10-CM | POA: Diagnosis not present

## 2024-10-09 DIAGNOSIS — Z452 Encounter for adjustment and management of vascular access device: Secondary | ICD-10-CM | POA: Diagnosis not present

## 2024-10-09 MED ORDER — SODIUM CHLORIDE 0.9% FLUSH: 10.0000 mL | INTRAVENOUS | Status: DC | PRN

## 2024-10-09 NOTE — Patient Instructions (Signed)

## 2024-10-12 ENCOUNTER — Other Ambulatory Visit: Payer: Self-pay | Admitting: Nurse Practitioner

## 2024-10-13 ENCOUNTER — Other Ambulatory Visit: Payer: Self-pay

## 2024-10-15 ENCOUNTER — Encounter: Payer: Self-pay | Admitting: Oncology

## 2024-10-16 ENCOUNTER — Telehealth: Payer: Self-pay | Admitting: Oncology

## 2024-10-16 ENCOUNTER — Telehealth: Payer: Self-pay

## 2024-10-16 NOTE — Telephone Encounter (Signed)
 waiting for blood test before getting infusion.

## 2024-10-16 NOTE — Telephone Encounter (Signed)
 Copied from CRM [id number]. Topic: Appointments - Appointment Scheduling >> Oct 16, 2024  8:53 AM Leila C wrote: Patient/patient representative is calling to schedule an appointment. Refer to attachments for appointment information.  Patient [phone] is done with chemo therapy, and wants to see Dr. Kara before starting hospice (lov 02/11/24). Next available appointment is 12/24/24 with Dr. Kara. Patient states cannot wait that long, needs to be seen in a week or two weeks. Please advise and call back.    Waiting on patient to return call to which time he can do on 10/20/24   1:30 or 3pm

## 2024-10-17 ENCOUNTER — Other Ambulatory Visit: Payer: Self-pay | Admitting: Oncology

## 2024-10-19 ENCOUNTER — Inpatient Hospital Stay: Admitting: Nurse Practitioner

## 2024-10-19 ENCOUNTER — Other Ambulatory Visit: Payer: Self-pay | Admitting: Nurse Practitioner

## 2024-10-19 ENCOUNTER — Inpatient Hospital Stay

## 2024-10-19 ENCOUNTER — Telehealth: Payer: Self-pay

## 2024-10-19 VITALS — BP 133/64 | HR 87 | Temp 97.8°F | Resp 18 | Ht 66.0 in | Wt 235.7 lb

## 2024-10-19 DIAGNOSIS — C250 Malignant neoplasm of head of pancreas: Secondary | ICD-10-CM

## 2024-10-19 DIAGNOSIS — C7800 Secondary malignant neoplasm of unspecified lung: Secondary | ICD-10-CM | POA: Diagnosis not present

## 2024-10-19 LAB — CBC WITH DIFFERENTIAL (CANCER CENTER ONLY)
Abs Immature Granulocytes: 0.08 K/uL — ABNORMAL HIGH (ref 0.00–0.07)
Basophils Absolute: 0.1 K/uL (ref 0.0–0.1)
Basophils Relative: 1 %
Eosinophils Absolute: 0.2 K/uL (ref 0.0–0.5)
Eosinophils Relative: 2 %
HCT: 37.6 % — ABNORMAL LOW (ref 39.0–52.0)
Hemoglobin: 12.6 g/dL — ABNORMAL LOW (ref 13.0–17.0)
Immature Granulocytes: 1 %
Lymphocytes Relative: 13 %
Lymphs Abs: 1.2 K/uL (ref 0.7–4.0)
MCH: 32.1 pg (ref 26.0–34.0)
MCHC: 33.5 g/dL (ref 30.0–36.0)
MCV: 95.9 fL (ref 80.0–100.0)
Monocytes Absolute: 0.8 K/uL (ref 0.1–1.0)
Monocytes Relative: 9 %
Neutro Abs: 7.3 K/uL (ref 1.7–7.7)
Neutrophils Relative %: 74 %
Platelet Count: 96 K/uL — ABNORMAL LOW (ref 150–400)
RBC: 3.92 MIL/uL — ABNORMAL LOW (ref 4.22–5.81)
RDW: 15.9 % — ABNORMAL HIGH (ref 11.5–15.5)
WBC Count: 9.8 K/uL (ref 4.0–10.5)
nRBC: 0 % (ref 0.0–0.2)

## 2024-10-19 LAB — CMP (CANCER CENTER ONLY)
ALT: 25 U/L (ref 0–44)
AST: 27 U/L (ref 15–41)
Albumin: 3.7 g/dL (ref 3.5–5.0)
Alkaline Phosphatase: 78 U/L (ref 38–126)
Anion gap: 13 (ref 5–15)
BUN: 14 mg/dL (ref 8–23)
CO2: 23 mmol/L (ref 22–32)
Calcium: 9.6 mg/dL (ref 8.9–10.3)
Chloride: 105 mmol/L (ref 98–111)
Creatinine: 0.96 mg/dL (ref 0.61–1.24)
GFR, Estimated: >60 mL/min
Glucose, Bld: 192 mg/dL — ABNORMAL HIGH (ref 70–99)
Potassium: 3.7 mmol/L (ref 3.5–5.1)
Sodium: 141 mmol/L (ref 135–145)
Total Bilirubin: 0.3 mg/dL (ref 0.0–1.2)
Total Protein: 6.2 g/dL — ABNORMAL LOW (ref 6.5–8.1)

## 2024-10-19 NOTE — Progress Notes (Signed)
 " [facility] OFFICE PROGRESS NOTE   Diagnosis: Pancreas cancer  INTERVAL HISTORY:   Mr. Bankhead returns as scheduled.  He completed cycle 4 FOLFOX 10/07/2024.  He denies nausea/vomiting.  No mouth sores.  No diarrhea.  Neuropathy symptoms may be increased in the feet.  He denies abdominal pain.  He has a good appetite.  No change in baseline dyspnea.  Cough continues to be markedly improved.  Main complaint is fatigue following treatment.  Objective:  Vital signs in last 24 hours:  Blood pressure 133/64, pulse 87, temperature 97.8 F (36.6 C), temperature source Temporal, resp. rate 18, height 5' 6 (1.676 m), weight 235 lb 11.2 oz (106.9 kg), SpO2 98%.    HEENT: No thrush or ulcers. Resp: Lungs clear bilaterally. Cardio: Regular rate and rhythm. GI: No hepatosplenomegaly.  Nontender. Vascular: Trace lower leg edema bilaterally. Neuro: Alert and oriented. Skin: Palms without erythema. Port-A-Cath without erythema.  Lab Results:  Lab Results  Component Value Date   WBC 9.8 10/19/2024   HGB 12.6 (L) 10/19/2024   HCT 37.6 (L) 10/19/2024   MCV 95.9 10/19/2024   PLT 96 (L) 10/19/2024   NEUTROABS 7.3 10/19/2024    Imaging:  No results found.  Medications: I have reviewed the patient's current medications.  Assessment/Plan: Pancreas cancer 11/19/2022: 2.9 cm thin-walled cystic lesion in the pancreas uncinate 07/28/2023: CT abdomen/pelvis: Stable 2.8 cm cystic area in the uncinate process 11/29/2023: CT chest-subsegmental and segmental pulmonary emboli in the left upper lobe, dilation of the right ventricle, multiple pulmonary nodules 12/26/2023: CA 19-9- 66,286 12/27/2023: PET-5 cm hypermetabolic pancreas head/uncinate mass, numerous hypermetabolic pulmonary nodules, hypermetabolic left supraclavicular and retroperitoneal lymph nodes 01/06/2024: Left supraclavicular lymph node biopsy-metastatic moderately differentiated adenocarcinoma, immunohistochemistry  pattern consistent with pancreaticobiliary, upper GI, breast, and salivary gland carcinoma Foundation 1 01/06/2024-HRDsig negative, microsatellite stable, tumor mutation burden 2, K-ras G12R Cycle 1 gemcitabine /Abraxane  01/16/2024 Cycle 2 gemcitabine /Abraxane  01/30/2024 Cycle 3 gemcitabine /Abraxane  02/13/2024 Cycle 4 gemcitabine /Abraxane  02/27/2024 Cycle 5 gemcitabine /Abraxane  03/12/2024 03/23/2024 CTs: Decrease size of pancreas head/uncinate mass, mixed response involving bilateral pulmonary nodules, decreased left supraclavicular retroperitoneal lymph nodes Cycle 6 gemcitabine /Abraxane  03/27/2024 Cycle 7 gemcitabine /Abraxane  04/09/2024 Cycle 8 gemcitabine /Abraxane  04/23/2024 Cycle 9 gemcitabine /Abraxane  05/07/2024 Cycle 10 gemcitabine /Abraxane  05/28/2024 Cycle 11 gemcitabine /Abraxane  06/12/2024 CTs 06/23/2024-increase in size of numerous bilateral irregular pulmonary nodules.  Pancreas head mass is similar in size.  Decreased size of left supraclavicular and retroperitoneal lymph nodes.  Per review by Dr. Cloretta, overall stable. Cycle 12 gemcitabine /Abraxane  06/25/2024, repeat noncontrast chest CT at a 6-week interval Cycle 13 gemcitabine /Abraxane  07/09/2024 Cycle 14 gemcitabine /Abraxane  07/23/2024 07/29/2024 CT chest: Progression of pulmonary metastases Cycle 1 FOLFOX 08/19/2024 Cycle 2 FOLFOX 09/09/2024 Cycle 3 FOLFOX 09/24/2024 Cycle 4 FOLFOX 10/07/2024 Pulmonary embolism 11/29/2023 Small segmental and subsegmental pulmonary emboli in the left upper lobe, dilation of right ventricle Admitted for heparin  anticoagulation followed by apixaban -discharged 12/01/2023 Lower extremity Dopplers 11/30/2023: Acute left posterior tibial, peroneal, and TP trunk DVTs, acute right peroneal DVT Lovenox  12/31/2023 Lovenox  discontinued, Eliquis  initiated 04/14/2024   3.  CVAs-visual disturbance and altered mental status 12/30/2023 MRI brain-many foci of restricted diffusion in the cerebellum, left frontal, left basal  ganglia, and parietal lobes compatible with acute/subacute infarcts, consider embolic etiology 4.  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  Kidney stones 6.  Gout 7.  Family history of pancreas cancer 8.  History of basal cell carcinoma 9.  Negative genetic testing with VUS in EGFR.  First-degree relatives are candidates for considering  pancreatic cancer screening based on family history.  Discussed with patient and wife 01/30/2024.   10. NSTEMI 09/13/2024  Disposition: Mr. Nakama appears stable.  He has completed 4 cycles of FOLFOX.  Aside from neuropathy and fatigue he seems to be tolerating treatment well.  The CA 19-9 tumor marker from today is pending.  He would like to use the value to help make a decision regarding future treatments.  If the CA 19-9 is lower he wants to continue treatment, if higher discontinue treatment.  We will contact him once the result is available.  We discussed restaging CTs as a more objective measure of treatment response.  He does not wish to undergo further CT scans.  Patient seen with Dr. Cloretta.    [name] ANP/GNP-BC   10/19/2024  12:21 PM  This was a shared visit with [name].  We discussed treatment options with Mr. Raimondi.  He complains of malaise lasting for 1 week following chemotherapy.  It was more difficult to recover from the last cycle of chemotherapy.  It is unclear whether his symptoms are related to chemotherapy or tumor progression.  He declines a restaging CT evaluation.  He would like to discontinue chemotherapy if the CA 19-9 is higher today.  We will contact him with the CA 19-9 result and decide on continuing chemotherapy on a 3-week schedule versus hospice care.  Arvella Cloretta, MD  Addendum 4:14 PM on 10/21/2024-Mr. Amadon was in the office today for his wife's appointment.  We reviewed the CA 19-9 tumor marker result from 10/19/2024.  He understands the value is higher.  He has decided to discontinue  further treatment.  We discussed a referral to the home hospice program.  He is in agreement.  We confirmed No Code Blue status.  He will return for a follow-up visit as scheduled 11/03/2023.  LT/BS    "

## 2024-10-19 NOTE — Patient Instructions (Signed)

## 2024-10-19 NOTE — Telephone Encounter (Signed)
Will review w/NP

## 2024-10-19 NOTE — Telephone Encounter (Signed)
 Purpose: Discuss upcoming appointments. Patient Request: Move infusion to Thursday to align with spouses schedule. Action Taken: Confirmed infusion rescheduled to Thursday. Reminder: Pump removal scheduled for Saturday at [facility]. Patient Response: Verbalized understanding; confirmed spouse has injection appointment same day. Outcome: Patient aware of new treatment date/time and pump stop on Saturday at [facility].

## 2024-10-20 ENCOUNTER — Inpatient Hospital Stay

## 2024-10-20 ENCOUNTER — Telehealth: Payer: Self-pay | Admitting: *Deleted

## 2024-10-20 ENCOUNTER — Encounter: Payer: Self-pay | Admitting: Pulmonary Disease

## 2024-10-20 ENCOUNTER — Encounter: Payer: Self-pay | Admitting: Oncology

## 2024-10-20 ENCOUNTER — Ambulatory Visit: Admitting: Pulmonary Disease

## 2024-10-20 VITALS — BP 126/72 | HR 66 | Ht 66.0 in | Wt 234.5 lb

## 2024-10-20 DIAGNOSIS — Z86711 Personal history of pulmonary embolism: Secondary | ICD-10-CM

## 2024-10-20 DIAGNOSIS — C250 Malignant neoplasm of head of pancreas: Secondary | ICD-10-CM

## 2024-10-20 DIAGNOSIS — C7800 Secondary malignant neoplasm of unspecified lung: Secondary | ICD-10-CM

## 2024-10-20 DIAGNOSIS — R0600 Dyspnea, unspecified: Secondary | ICD-10-CM | POA: Diagnosis not present

## 2024-10-20 DIAGNOSIS — R0602 Shortness of breath: Secondary | ICD-10-CM

## 2024-10-20 DIAGNOSIS — R600 Localized edema: Secondary | ICD-10-CM | POA: Diagnosis not present

## 2024-10-20 DIAGNOSIS — C7801 Secondary malignant neoplasm of right lung: Secondary | ICD-10-CM

## 2024-10-20 DIAGNOSIS — R059 Cough, unspecified: Secondary | ICD-10-CM | POA: Diagnosis not present

## 2024-10-20 LAB — CANCER ANTIGEN 19-9: CA 19-9: 9142 U/mL — ABNORMAL HIGH (ref 0–35)

## 2024-10-20 NOTE — Assessment & Plan Note (Signed)
 SABRA

## 2024-10-20 NOTE — Telephone Encounter (Signed)
 done

## 2024-10-20 NOTE — Progress Notes (Signed)
 "  Established Patient Pulmonology Office Visit   Subjective:  Patient ID: Timothy Wilcox, male    DOB: 11/23/1940  MRN: [id number]  CC:  Chief Complaint  Patient presents with   Medical Management of Chronic Issues    Pt states he has cancer found in march     Discussed the use of AI scribe software for clinical note transcription with the patient, who gave verbal consent to proceed.  History of Present Illness Timothy Wilcox is an 84 year old male with metastatic pancreatic cancer to the lungs who presents with cough and shortness of breath.  He reports a persistent cough and exertional shortness of breath for several months. Cough was more frequent weeks ago but has improved. He becomes markedly short of breath with minimal exertion such as walking 20 to 30 feet. He denies wheezing or gurgling with breathing. He has significant postnasal drip. He uses CPAP for obstructive sleep apnea.  His CA 19-9 has risen despite palliative chemotherapy, and his initial regimen lost effectiveness around September 2025. He completed cycle four of FOLFOX on October 07, 2024. CT chest on September 13, 2024, showed resolution of prior pulmonary emboli and interval enlargement of bilateral pulmonary nodules, with no pleural effusion.  His oxygen saturation stays above 94 to 95 percent with activity, but he has pronounced exertional dyspnea. He has become much less active and now becomes breathless after short distances compared with prior ability to walk a mile.  He is on daily prednisone  and Eliquis  twice daily for prior DVT/PE. He notes intermittent leg swelling, worse in the morning and improving with elevation.  His daughter has advanced cancer and is declining. His wife is also on chemotherapy and has an ostomy. He has family in town who can assist with his care.        ROS   Current Medications[1]      Objective:  BP 126/72   Pulse 66   Ht 5' 6 (1.676 m) Comment: per pt   Wt 234 lb 8 oz (106.4 kg)   SpO2 98%   BMI 37.85 kg/m     Physical Exam Constitutional:      General: He is not in acute distress.    Appearance: Normal appearance. He is obese.  Eyes:     General: No scleral icterus.    Conjunctiva/sclera: Conjunctivae normal.  Cardiovascular:     Rate and Rhythm: Normal rate and regular rhythm.  Pulmonary:     Breath sounds: No wheezing, rhonchi or rales.  Musculoskeletal:     Right lower leg: No edema.     Left lower leg: No edema.  Skin:    General: Skin is warm and dry.  Neurological:     General: No focal deficit present.      Diagnostic Review:  Last CBC Lab Results  Component Value Date   WBC 9.8 10/19/2024   HGB 12.6 (L) 10/19/2024   HCT 37.6 (L) 10/19/2024   MCV 95.9 10/19/2024   MCH 32.1 10/19/2024   RDW 15.9 (H) 10/19/2024   PLT 96 (L) 10/19/2024   Last metabolic panel Lab Results  Component Value Date   GLUCOSE 192 (H) 10/19/2024   NA 141 10/19/2024   K 3.7 10/19/2024   CL 105 10/19/2024   CO2 23 10/19/2024   BUN 14 10/19/2024   CREATININE 0.96 10/19/2024   GFRNONAA >60 10/19/2024   CALCIUM  9.6 10/19/2024   PROT 6.2 (L) 10/19/2024   ALBUMIN  3.7 10/19/2024  BILITOT 0.3 10/19/2024   ALKPHOS 78 10/19/2024   AST 27 10/19/2024   ALT 25 10/19/2024   ANIONGAP 13 10/19/2024   CTA Chest 09/13/24 1. No evidence of pulmonary embolus. 2. New and enlarging bilateral pulmonary nodules as above, consistent with progression of pulmonary metastases. 3. Complex cystic and solid pancreatic mass compatible with known pancreatic neoplasm. 4. New indeterminate 1.9 cm right lobe liver hypodensity, suspicious for hepatic metastasis. 5.  Aortic Atherosclerosis (ICD10-I70.0).    Assessment & Plan:   Assessment & Plan Malignant neoplasm metastatic to both lungs [facility])     Malignant neoplasm of head of pancreas (HCC)     Shortness of breath      Assessment and Plan Assessment & Plan Metastatic pancreatic cancer  with pulmonary metastases Progression with enlarged pulmonary nodules and increased CA 19-9 levels despite FOLFOX. Considering hospice due to lack of effective treatment. - Discuss hospice care options with oncology team. - Reviewed outline of what hospice care may entail at home and at a facility - Consider morphine  for dyspnea if symptoms worsen.  History of pulmonary embolism and deep vein thrombosis Continued high risk of clot formation due to cancer. No new embolion imaging. - Continue Eliquis  for anticoagulation.  Dyspnea and cough secondary to malignancy and deconditioning Attributed to pulmonary metastases and deconditioning. Oxygen saturation adequate. Discussed exercise benefits and inhaler use. Morphine  considered for severe dyspnea. - Provided sample of Breztri inhaler for symptomatic relief. - Encouraged simple walking and resistance training exercises. - He does not qualify for home oxygen as he did not desaturate below 88% today on simple walk. Completed 3 laps.  Peripheral edema Mild edema possibly due to reduced activity and fluid retention. Improvement with leg elevation. Discussed diuretics for swelling and breathing. - Consider trial of low-dose Lasix for 3-5 days to assess impact on swelling and breathing.      Return if symptoms worsen or fail to improve.   Dorn KATHEE Chill, MD     [1]  Current Outpatient Medications:    allopurinol  (ZYLOPRIM ) 300 MG tablet, Take 1 tablet (300 mg total) by mouth daily., Disp: 90 tablet, Rfl: 1   amLODipine  (NORVASC ) 5 MG tablet, Take 1 tablet (5 mg total) by mouth daily., Disp: 90 tablet, Rfl: 3   apixaban  (ELIQUIS ) 5 MG TABS tablet, Take 1 tablet (5 mg total) by mouth 2 (two) times daily., Disp: 60 tablet, Rfl: 5   aspirin  EC 81 MG tablet, Take 81 mg by mouth daily., Disp: , Rfl:    Docusate Calcium  (STOOL SOFTENER PO), Take 1 tablet by mouth daily., Disp: , Rfl:    fluticasone  (FLONASE ) 50 MCG/ACT nasal spray, Place 2  sprays into both nostrils See admin instructions. Instill 2 sprays into both nostrils in the morning and evening, Disp: , Rfl:    HYDROcodone -acetaminophen  (NORCO/VICODIN) 5-325 MG tablet, Take 1 tablet by mouth every 6 (six) hours as needed for moderate pain (pain score 4-6)., Disp: 30 tablet, Rfl: 0   Lactobacillus Rhamnosus, GG, (CULTURELLE) CAPS, Take 1 capsule by mouth in the morning., Disp: , Rfl:    lidocaine  (LIDODERM ) 5 %, Place 1 patch onto the skin daily. May wear up to 12 hours., Disp: 30 patch, Rfl: 0   loratadine  (CLARITIN ) 10 MG tablet, Take 10 mg by mouth See admin instructions. Take 10 mg by mouth in the morning and evening, Disp: , Rfl:    Menthol-Methyl Salicylate (SALONPAS PAIN RELIEF PATCH) PTCH, Apply 1 patch topically daily as needed (for pain).,  Disp: , Rfl:    Multiple Vitamin (MULTIVITAMIN WITH MINERALS) TABS, Take 1 tablet by mouth daily with breakfast., Disp: , Rfl:    neomycin -polymyxin b-dexamethasone  (MAXITROL ) 3.5-10000-0.1 SUSP, Place 1 drop into the left eye 4 (four) times daily., Disp: 5 mL, Rfl: 0   nitroGLYCERIN  (NITROSTAT ) 0.4 MG SL tablet, Place 1 tablet (0.4 mg total) under the tongue every 5 (five) minutes as needed for chest pain. X 3 doses, Disp: 25 tablet, Rfl: 0   predniSONE  (DELTASONE ) 20 MG tablet, Take 1 tablet (20 mg total) by mouth daily with breakfast., Disp: 90 tablet, Rfl: 0   PRESCRIPTION MEDICATION, CPAP- At bedtime, Disp: , Rfl:    Wheat Dextrin (BENEFIBER) POWD, Take 4 g by mouth See admin instructions. Mix 4 grams (1 teaspoonful) into a desired beverage and drink in the morning, Disp: , Rfl:    zolpidem  (AMBIEN ) 10 MG tablet, Take 1 tablet (10 mg total) by mouth at bedtime as needed., Disp: 30 tablet, Rfl: 1   isosorbide  mononitrate (IMDUR ) 30 MG 24 hr tablet, Take 0.5 tablets (15 mg total) by mouth daily for 7 days, THEN 1 tablet (30 mg total) daily for 26 days. (Patient not taking: No sig reported), Disp: 30 tablet, Rfl: 0  "

## 2024-10-20 NOTE — Patient Instructions (Signed)
 Agree with talking with the hospice team and Dr. Cloretta  Try breztri inhaler 2 puffs twice daily - rinse mouth out after each use  Let us  know if you would like to continue on this inhaler

## 2024-10-21 ENCOUNTER — Other Ambulatory Visit: Payer: Self-pay | Admitting: Nurse Practitioner

## 2024-10-21 ENCOUNTER — Telehealth: Payer: Self-pay | Admitting: Nurse Practitioner

## 2024-10-21 DIAGNOSIS — C250 Malignant neoplasm of head of pancreas: Secondary | ICD-10-CM

## 2024-10-22 ENCOUNTER — Encounter: Payer: Self-pay | Admitting: *Deleted

## 2024-10-22 ENCOUNTER — Inpatient Hospital Stay

## 2024-10-22 NOTE — Progress Notes (Signed)
 Called [facility] [place] [phone] to request hospice services for this patient.   Per Dr Cloretta:  Please refer to [facility].  Diagnosis is metastatic pancreatic cancer.  NO CODE BLUE.  Dr. Cloretta attending.

## 2024-10-24 ENCOUNTER — Inpatient Hospital Stay

## 2024-10-26 ENCOUNTER — Telehealth: Payer: Self-pay | Admitting: Oncology

## 2024-10-26 ENCOUNTER — Telehealth: Payer: Self-pay | Admitting: *Deleted

## 2024-10-26 ENCOUNTER — Other Ambulatory Visit: Payer: Self-pay | Admitting: *Deleted

## 2024-10-26 ENCOUNTER — Inpatient Hospital Stay: Admitting: Oncology

## 2024-10-26 ENCOUNTER — Other Ambulatory Visit (HOSPITAL_BASED_OUTPATIENT_CLINIC_OR_DEPARTMENT_OTHER): Payer: Self-pay

## 2024-10-26 VITALS — BP 125/67 | HR 91 | Temp 97.8°F | Resp 18 | Ht 66.0 in | Wt 239.3 lb

## 2024-10-26 DIAGNOSIS — C250 Malignant neoplasm of head of pancreas: Secondary | ICD-10-CM | POA: Diagnosis not present

## 2024-10-26 DIAGNOSIS — C7800 Secondary malignant neoplasm of unspecified lung: Secondary | ICD-10-CM | POA: Diagnosis not present

## 2024-10-26 MED ORDER — DOXYCYCLINE HYCLATE 100 MG PO TABS
100.0000 mg | ORAL_TABLET | Freq: Two times a day (BID) | ORAL | 0 refills | Status: DC
  Filled 2024-10-26: qty 14, 7d supply, fill #0

## 2024-10-26 NOTE — Telephone Encounter (Addendum)
 [name] reports sudden onset of swelling and tenderness in right tonsil area. Has also noted a 2 inch mass forming internally under his chin. Both started last night. His temp is normal with no other symptoms (has not yet agreed to Hospice).  [name] instructed to come to office for evaluation

## 2024-10-26 NOTE — Telephone Encounter (Signed)
 Called PT to let him know about same day appt; time confirmed.

## 2024-10-26 NOTE — Progress Notes (Signed)
 " [facility] OFFICE PROGRESS NOTE   Diagnosis: Pancreas cancer  INTERVAL HISTORY:   Timothy Wilcox returns prior to his scheduled visit.  He reports discomfort and swelling at the right tonsil and beneath the chin beginning yesterday.  No sore throat.  No fever.  He has a good appetite.  He notes cold sensitivity at a right upper tooth. He was contacted by hospice, but has not enrolled.  He would like a home palliative care referral.  Objective:  Vital signs in last 24 hours:  Blood pressure 125/67, pulse 91, temperature 97.8 F (36.6 C), temperature source Temporal, resp. rate 18, height 5' 6 (1.676 m), weight 239 lb 4.8 oz (108.5 kg), SpO2 100%.    HEENT: Oral cavity without visible mass, no thrush, fatty tissue in the submental area, tender over the right submandibular gland Lymphatics: No cervical or supraclavicular nodes Resp: Lungs clear with end inspiratory rhonchi at the posterior base bilaterally, no respiratory distress Cardio: Regular rate and rhythm  Skin: No venous engorgement over the chest  Portacath/PICC-without erythema  Lab Results:  Lab Results  Component Value Date   WBC 9.8 10/19/2024   HGB 12.6 (L) 10/19/2024   HCT 37.6 (L) 10/19/2024   MCV 95.9 10/19/2024   PLT 96 (L) 10/19/2024   NEUTROABS 7.3 10/19/2024    CMP  Lab Results  Component Value Date   NA 141 10/19/2024   K 3.7 10/19/2024   CL 105 10/19/2024   CO2 23 10/19/2024   GLUCOSE 192 (H) 10/19/2024   BUN 14 10/19/2024   CREATININE 0.96 10/19/2024   CALCIUM  9.6 10/19/2024   PROT 6.2 (L) 10/19/2024   ALBUMIN  3.7 10/19/2024   AST 27 10/19/2024   ALT 25 10/19/2024   ALKPHOS 78 10/19/2024   BILITOT 0.3 10/19/2024   GFRNONAA >60 10/19/2024   GFRAA >60 11/08/2018    Lab Results  Component Value Date   RJW800 9,142 (H) 10/19/2024    Lab Results  Component Value Date   INR 1.1 11/30/2023   LABPROT 14.5 11/30/2023    Imaging:  No results found.  Medications: I  have reviewed the patient's current medications.   Assessment/Plan:  Pancreas cancer 11/19/2022: 2.9 cm thin-walled cystic lesion in the pancreas uncinate 07/28/2023: CT abdomen/pelvis: Stable 2.8 cm cystic area in the uncinate process 11/29/2023: CT chest-subsegmental and segmental pulmonary emboli in the left upper lobe, dilation of the right ventricle, multiple pulmonary nodules 12/26/2023: CA 19-9- 66,286 12/27/2023: PET-5 cm hypermetabolic pancreas head/uncinate mass, numerous hypermetabolic pulmonary nodules, hypermetabolic left supraclavicular and retroperitoneal lymph nodes 01/06/2024: Left supraclavicular lymph node biopsy-metastatic moderately differentiated adenocarcinoma, 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, upper GI, breast, and salivary gland carcinoma Foundation 1 01/06/2024-HRDsig negative, microsatellite stable, tumor mutation burden 2, K-ras G12R Cycle 1 gemcitabine /Abraxane  01/16/2024 Cycle 2 gemcitabine /Abraxane  01/30/2024 Cycle 3 gemcitabine /Abraxane  02/13/2024 Cycle 4 gemcitabine /Abraxane  02/27/2024 Cycle 5 gemcitabine /Abraxane  03/12/2024 03/23/2024 CTs: Decrease size of pancreas head/uncinate mass, mixed response involving bilateral pulmonary nodules, decreased left supraclavicular retroperitoneal lymph nodes Cycle 6 gemcitabine /Abraxane  03/27/2024 Cycle 7 gemcitabine /Abraxane  04/09/2024 Cycle 8 gemcitabine /Abraxane  04/23/2024 Cycle 9 gemcitabine /Abraxane  05/07/2024 Cycle 10 gemcitabine /Abraxane  05/28/2024 Cycle 11 gemcitabine /Abraxane  06/12/2024 CTs 06/23/2024-increase in size of numerous bilateral irregular pulmonary nodules.  Pancreas head mass is similar in size.  Decreased size of left supraclavicular and retroperitoneal lymph nodes.  Per review by Dr. Cloretta, overall stable. Cycle 12 gemcitabine /Abraxane  06/25/2024, repeat noncontrast chest CT at a 6-week interval Cycle 13 gemcitabine /Abraxane  07/09/2024 Cycle 14 gemcitabine /Abraxane   07/23/2024 07/29/2024 CT chest: Progression of  pulmonary metastases Cycle 1 FOLFOX 08/19/2024 Cycle 2 FOLFOX 09/09/2024 Cycle 3 FOLFOX 09/24/2024 Cycle 4 FOLFOX 10/07/2024 Pulmonary embolism 11/29/2023 Small segmental and subsegmental pulmonary emboli in the left upper lobe, dilation of right ventricle Admitted for heparin  anticoagulation followed by apixaban -discharged 12/01/2023 Lower extremity Dopplers 11/30/2023: Acute left posterior tibial, peroneal, and TP trunk DVTs, acute right peroneal DVT Lovenox  12/31/2023 Lovenox  discontinued, Eliquis  initiated 04/14/2024   3.  CVAs-visual disturbance and altered mental status 12/30/2023 MRI brain-many foci of restricted diffusion in the cerebellum, left frontal, left basal ganglia, and parietal lobes compatible with acute/subacute infarcts, consider embolic etiology 4.  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  Kidney stones 6.  Gout 7.  Family history of pancreas cancer 8.  History of basal cell carcinoma 9.  Negative genetic testing with VUS in EGFR.  First-degree relatives are candidates for considering pancreatic cancer screening based on family history.  Discussed with patient and wife 01/30/2024.   10. NSTEMI 09/13/2024   Disposition: Timothy Wilcox presents for an unscheduled visit.  He has tenderness over the right submandibular gland.  The soft fullness in the submental region is likely cushingoid change related to steroids.  He does not appear to have metastatic disease to the right neck.  He may have a dental infection.  He will complete a course of doxycycline .  He will contact us  if there is no improvement. Timothy Wilcox will return as scheduled 11/02/2024.  We will make a referral to [facility] home palliative care. Arley Hof, MD  10/26/2024  12:15 PM   "

## 2024-10-26 NOTE — Progress Notes (Signed)
 Antibiotic script sent to [facility] and Palliative Team Referral placed per patient request. Not ready yet for full Hospice.

## 2024-10-29 ENCOUNTER — Other Ambulatory Visit: Payer: Self-pay | Admitting: Nurse Practitioner

## 2024-10-29 ENCOUNTER — Other Ambulatory Visit: Payer: Self-pay | Admitting: *Deleted

## 2024-10-29 ENCOUNTER — Other Ambulatory Visit (HOSPITAL_BASED_OUTPATIENT_CLINIC_OR_DEPARTMENT_OTHER): Payer: Self-pay

## 2024-10-29 ENCOUNTER — Other Ambulatory Visit: Payer: Self-pay | Admitting: Oncology

## 2024-10-29 DIAGNOSIS — C250 Malignant neoplasm of head of pancreas: Secondary | ICD-10-CM

## 2024-10-29 MED ORDER — ZOLPIDEM TARTRATE 10 MG PO TABS: 10.0000 mg | ORAL_TABLET | Freq: Every evening | ORAL | 1 refills | Status: DC

## 2024-10-29 MED ORDER — PREDNISONE 20 MG PO TABS
20.0000 mg | ORAL_TABLET | Freq: Every day | ORAL | 2 refills | Status: AC
  Filled 2024-10-29: qty 90, 90d supply, fill #0

## 2024-10-29 MED ORDER — APIXABAN 5 MG PO TABS
5.0000 mg | ORAL_TABLET | Freq: Two times a day (BID) | ORAL | 5 refills | Status: AC
  Filled 2024-10-29: qty 60, 30d supply, fill #0

## 2024-10-29 MED ORDER — ZOLPIDEM TARTRATE 10 MG PO TABS
10.0000 mg | ORAL_TABLET | Freq: Every evening | ORAL | 1 refills | Status: AC | PRN
  Filled 2024-10-29 – 2024-10-30 (×3): qty 30, 30d supply, fill #0

## 2024-10-30 ENCOUNTER — Telehealth: Payer: Self-pay

## 2024-10-30 ENCOUNTER — Other Ambulatory Visit (HOSPITAL_BASED_OUTPATIENT_CLINIC_OR_DEPARTMENT_OTHER): Payer: Self-pay

## 2024-10-30 NOTE — Telephone Encounter (Signed)
 Patient called back requesting a later appointment on Monday 11/09/24. Per [place], NP okay to schedule at 2:45 pm. Patient reschedule to and aware.

## 2024-10-31 ENCOUNTER — Other Ambulatory Visit: Payer: Self-pay

## 2024-11-02 ENCOUNTER — Inpatient Hospital Stay

## 2024-11-02 ENCOUNTER — Encounter: Payer: Self-pay | Admitting: Oncology

## 2024-11-02 ENCOUNTER — Inpatient Hospital Stay: Admitting: Nurse Practitioner

## 2024-11-02 NOTE — Telephone Encounter (Signed)
 error

## 2024-11-03 ENCOUNTER — Inpatient Hospital Stay

## 2024-11-05 ENCOUNTER — Inpatient Hospital Stay

## 2024-11-08 ENCOUNTER — Telehealth: Payer: Self-pay

## 2024-11-08 NOTE — Telephone Encounter (Signed)
 Spoke directly with patient advising of new appointments date and time, he was agreeable and verbalized understanding.

## 2024-11-09 ENCOUNTER — Inpatient Hospital Stay

## 2024-11-09 ENCOUNTER — Inpatient Hospital Stay: Admitting: Nurse Practitioner

## 2024-11-10 ENCOUNTER — Other Ambulatory Visit: Payer: Self-pay

## 2024-11-13 ENCOUNTER — Inpatient Hospital Stay: Attending: Oncology

## 2024-11-13 ENCOUNTER — Other Ambulatory Visit (HOSPITAL_BASED_OUTPATIENT_CLINIC_OR_DEPARTMENT_OTHER): Payer: Self-pay

## 2024-11-13 ENCOUNTER — Encounter: Payer: Self-pay | Admitting: Oncology

## 2024-11-13 ENCOUNTER — Telehealth: Payer: Self-pay | Admitting: *Deleted

## 2024-11-13 ENCOUNTER — Other Ambulatory Visit: Payer: Self-pay | Admitting: Nurse Practitioner

## 2024-11-13 DIAGNOSIS — N390 Urinary tract infection, site not specified: Secondary | ICD-10-CM

## 2024-11-13 DIAGNOSIS — C250 Malignant neoplasm of head of pancreas: Secondary | ICD-10-CM

## 2024-11-13 DIAGNOSIS — R319 Hematuria, unspecified: Secondary | ICD-10-CM

## 2024-11-13 LAB — URINALYSIS, COMPLETE (UACMP) WITH MICROSCOPIC
Bacteria, UA: NONE SEEN
Bilirubin Urine: NEGATIVE
Glucose, UA: NEGATIVE mg/dL
Ketones, ur: NEGATIVE mg/dL
Nitrite: NEGATIVE
Protein, ur: 30 mg/dL — AB
RBC / HPF: >50 RBC/hpf (ref 0–5)
Specific Gravity, Urine: 1.02 (ref 1.005–1.030)
WBC, UA: >50 WBC/hpf (ref 0–5)
pH: 6 (ref 5.0–8.0)

## 2024-11-13 MED ORDER — SULFAMETHOXAZOLE-TRIMETHOPRIM 800-160 MG PO TABS
1.0000 | ORAL_TABLET | Freq: Two times a day (BID) | ORAL | 0 refills | Status: AC
  Filled 2024-11-13: qty 14, 7d supply, fill #0

## 2024-11-13 NOTE — Telephone Encounter (Signed)
 Notified Timothy Wilcox that UA is suspicious for UTI. Will add culture and script sent to [facility] for Bactrim  DS

## 2024-11-13 NOTE — Telephone Encounter (Signed)
 Called Timothy Wilcox in regards to [employer] regarding 4 day history of dark urine (could have some blood) w/strong odor. He denies fever or dysuria. This RN suggested he come in for U/A and possible culture. He expressed that he really does not want to come out today. Will see if wife has a sterile urine cup at home to bring when she comes in for injection today. Orders placed.

## 2024-11-17 ENCOUNTER — Inpatient Hospital Stay: Admitting: Nurse Practitioner

## 2024-11-17 ENCOUNTER — Inpatient Hospital Stay
